# Patient Record
Sex: Female | Born: 1960 | Race: White | Hispanic: No | Smoking: Former smoker
Health system: Southern US, Community
[De-identification: ages and names within clinical notes are randomized; demographics above are authoritative.]

## PROBLEM LIST (undated history)

## (undated) ENCOUNTER — Ambulatory Visit: Discharge status: Absent without leave | Payer: Medicare Other

## (undated) DIAGNOSIS — I7300 Raynaud's syndrome without gangrene: Secondary | ICD-10-CM

## (undated) DIAGNOSIS — F32A Depression, unspecified: Secondary | ICD-10-CM

## (undated) DIAGNOSIS — F419 Anxiety disorder, unspecified: Secondary | ICD-10-CM

## (undated) DIAGNOSIS — J189 Pneumonia, unspecified organism: Secondary | ICD-10-CM

## (undated) DIAGNOSIS — M797 Fibromyalgia: Secondary | ICD-10-CM

## (undated) DIAGNOSIS — E785 Hyperlipidemia, unspecified: Secondary | ICD-10-CM

## (undated) DIAGNOSIS — I00 Rheumatic fever without heart involvement: Secondary | ICD-10-CM

## (undated) DIAGNOSIS — F329 Major depressive disorder, single episode, unspecified: Secondary | ICD-10-CM

## (undated) DIAGNOSIS — M549 Dorsalgia, unspecified: Secondary | ICD-10-CM

## (undated) DIAGNOSIS — R32 Unspecified urinary incontinence: Secondary | ICD-10-CM

## (undated) DIAGNOSIS — R51 Headache: Secondary | ICD-10-CM

## (undated) DIAGNOSIS — Z8679 Personal history of other diseases of the circulatory system: Secondary | ICD-10-CM

## (undated) DIAGNOSIS — M2550 Pain in unspecified joint: Secondary | ICD-10-CM

## (undated) DIAGNOSIS — F319 Bipolar disorder, unspecified: Secondary | ICD-10-CM

## (undated) DIAGNOSIS — M1990 Unspecified osteoarthritis, unspecified site: Secondary | ICD-10-CM

## (undated) DIAGNOSIS — R351 Nocturia: Secondary | ICD-10-CM

## (undated) DIAGNOSIS — R011 Cardiac murmur, unspecified: Secondary | ICD-10-CM

## (undated) DIAGNOSIS — J302 Other seasonal allergic rhinitis: Secondary | ICD-10-CM

## (undated) DIAGNOSIS — IMO0002 Reserved for concepts with insufficient information to code with codable children: Secondary | ICD-10-CM

## (undated) DIAGNOSIS — A6000 Herpesviral infection of urogenital system, unspecified: Secondary | ICD-10-CM

## (undated) DIAGNOSIS — J45909 Unspecified asthma, uncomplicated: Secondary | ICD-10-CM

## (undated) DIAGNOSIS — K5900 Constipation, unspecified: Secondary | ICD-10-CM

## (undated) DIAGNOSIS — E039 Hypothyroidism, unspecified: Secondary | ICD-10-CM

## (undated) DIAGNOSIS — G8929 Other chronic pain: Secondary | ICD-10-CM

## (undated) DIAGNOSIS — K219 Gastro-esophageal reflux disease without esophagitis: Secondary | ICD-10-CM

## (undated) DIAGNOSIS — I10 Essential (primary) hypertension: Secondary | ICD-10-CM

## (undated) DIAGNOSIS — D649 Anemia, unspecified: Secondary | ICD-10-CM

## (undated) DIAGNOSIS — Z8709 Personal history of other diseases of the respiratory system: Secondary | ICD-10-CM

## (undated) DIAGNOSIS — R531 Weakness: Secondary | ICD-10-CM

## (undated) DIAGNOSIS — R609 Edema, unspecified: Secondary | ICD-10-CM

## (undated) DIAGNOSIS — M62838 Other muscle spasm: Secondary | ICD-10-CM

## (undated) DIAGNOSIS — I639 Cerebral infarction, unspecified: Secondary | ICD-10-CM

## (undated) DIAGNOSIS — R600 Localized edema: Secondary | ICD-10-CM

## (undated) HISTORY — PX: ESOPHAGOGASTRODUODENOSCOPY: SHX1529

## (undated) HISTORY — PX: CHOLECYSTECTOMY: SHX55

## (undated) HISTORY — PX: COLONOSCOPY: SHX174

## (undated) HISTORY — PX: TONSILLECTOMY: SUR1361

## (undated) HISTORY — PX: ABDOMINAL HYSTERECTOMY: SHX81

## (undated) HISTORY — PX: BACK SURGERY: SHX140

## (undated) HISTORY — PX: TUBAL LIGATION: SHX77

## (undated) HISTORY — PX: OTHER SURGICAL HISTORY: SHX169

## (undated) HISTORY — PX: SEPTOPLASTY: SUR1290

---

## 2004-06-19 ENCOUNTER — Emergency Department: Payer: Self-pay | Admitting: Emergency Medicine

## 2006-01-06 ENCOUNTER — Ambulatory Visit: Payer: Self-pay

## 2007-05-10 ENCOUNTER — Emergency Department: Payer: Self-pay | Admitting: Emergency Medicine

## 2007-05-20 ENCOUNTER — Ambulatory Visit: Payer: Self-pay | Admitting: Unknown Physician Specialty

## 2007-07-01 ENCOUNTER — Ambulatory Visit: Payer: Self-pay | Admitting: Anesthesiology

## 2007-07-21 ENCOUNTER — Inpatient Hospital Stay: Payer: Self-pay | Admitting: Vascular Surgery

## 2007-08-04 ENCOUNTER — Ambulatory Visit: Payer: Self-pay | Admitting: Anesthesiology

## 2007-08-16 ENCOUNTER — Encounter: Payer: Self-pay | Admitting: Anesthesiology

## 2007-09-02 ENCOUNTER — Ambulatory Visit: Payer: Self-pay | Admitting: Anesthesiology

## 2007-09-07 ENCOUNTER — Ambulatory Visit: Payer: Self-pay

## 2007-11-14 ENCOUNTER — Emergency Department: Payer: Self-pay | Admitting: Internal Medicine

## 2009-04-10 ENCOUNTER — Emergency Department: Payer: Self-pay | Admitting: Emergency Medicine

## 2009-05-23 ENCOUNTER — Ambulatory Visit: Payer: Self-pay | Admitting: Family Medicine

## 2009-05-28 ENCOUNTER — Ambulatory Visit: Payer: Self-pay | Admitting: Family Medicine

## 2009-11-11 ENCOUNTER — Emergency Department: Payer: Self-pay | Admitting: Emergency Medicine

## 2009-12-02 ENCOUNTER — Emergency Department (HOSPITAL_COMMUNITY): Admission: EM | Admit: 2009-12-02 | Discharge: 2009-12-02 | Payer: Self-pay | Admitting: Emergency Medicine

## 2010-01-23 ENCOUNTER — Encounter: Payer: Self-pay | Admitting: Rheumatology

## 2010-02-12 ENCOUNTER — Encounter: Payer: Self-pay | Admitting: Rheumatology

## 2010-08-15 ENCOUNTER — Emergency Department: Payer: Self-pay | Admitting: Emergency Medicine

## 2010-09-05 ENCOUNTER — Encounter: Payer: Self-pay | Admitting: Rheumatology

## 2010-09-13 ENCOUNTER — Encounter: Payer: Self-pay | Admitting: Rheumatology

## 2010-11-06 ENCOUNTER — Encounter: Payer: Self-pay | Admitting: Rheumatology

## 2011-02-01 ENCOUNTER — Emergency Department: Payer: Self-pay | Admitting: *Deleted

## 2011-12-08 ENCOUNTER — Ambulatory Visit: Payer: Self-pay | Admitting: Pain Medicine

## 2011-12-10 ENCOUNTER — Other Ambulatory Visit: Payer: Self-pay | Admitting: Pain Medicine

## 2011-12-10 LAB — SEDIMENTATION RATE: Erythrocyte Sed Rate: 1 mm/hr (ref 0–30)

## 2011-12-26 ENCOUNTER — Ambulatory Visit: Payer: Self-pay | Admitting: Pain Medicine

## 2012-01-05 ENCOUNTER — Ambulatory Visit: Payer: Self-pay | Admitting: Pain Medicine

## 2012-08-01 ENCOUNTER — Emergency Department: Payer: Self-pay | Admitting: Emergency Medicine

## 2012-08-01 LAB — CBC
HCT: 46.3 % (ref 35.0–47.0)
HGB: 16.1 g/dL — ABNORMAL HIGH (ref 12.0–16.0)
MCH: 32 pg (ref 26.0–34.0)
MCHC: 34.7 g/dL (ref 32.0–36.0)
MCV: 92 fL (ref 80–100)
Platelet: 203 10*3/uL (ref 150–440)
RBC: 5.01 10*6/uL (ref 3.80–5.20)
RDW: 13.8 % (ref 11.5–14.5)

## 2012-08-01 LAB — COMPREHENSIVE METABOLIC PANEL
Albumin: 3.7 g/dL (ref 3.4–5.0)
Alkaline Phosphatase: 68 U/L (ref 50–136)
BUN: 9 mg/dL (ref 7–18)
Bilirubin,Total: 0.5 mg/dL (ref 0.2–1.0)
Calcium, Total: 9.1 mg/dL (ref 8.5–10.1)
Creatinine: 0.75 mg/dL (ref 0.60–1.30)
Glucose: 147 mg/dL — ABNORMAL HIGH (ref 65–99)
Potassium: 3.1 mmol/L — ABNORMAL LOW (ref 3.5–5.1)
Sodium: 138 mmol/L (ref 136–145)

## 2012-08-17 ENCOUNTER — Emergency Department: Payer: Self-pay | Admitting: Emergency Medicine

## 2012-08-17 LAB — URINALYSIS, COMPLETE
Bacteria: NONE SEEN
Bilirubin,UR: NEGATIVE
Glucose,UR: NEGATIVE mg/dL (ref 0–75)
Ketone: NEGATIVE
Nitrite: NEGATIVE
Ph: 9 (ref 4.5–8.0)
Specific Gravity: 1.003 (ref 1.003–1.030)

## 2012-08-17 LAB — WET PREP, GENITAL

## 2012-08-17 LAB — GC/CHLAMYDIA PROBE AMP

## 2012-08-19 DIAGNOSIS — M545 Low back pain, unspecified: Secondary | ICD-10-CM | POA: Insufficient documentation

## 2012-08-31 ENCOUNTER — Ambulatory Visit: Payer: Self-pay | Admitting: Internal Medicine

## 2012-12-14 ENCOUNTER — Other Ambulatory Visit: Payer: Self-pay | Admitting: Neurosurgery

## 2012-12-14 DIAGNOSIS — M549 Dorsalgia, unspecified: Secondary | ICD-10-CM

## 2012-12-15 ENCOUNTER — Ambulatory Visit: Payer: Self-pay | Admitting: Gastroenterology

## 2012-12-21 ENCOUNTER — Ambulatory Visit
Admission: RE | Admit: 2012-12-21 | Discharge: 2012-12-21 | Disposition: A | Discharge status: Home / Self Care | Payer: PRIVATE HEALTH INSURANCE | Source: Ambulatory Visit | Attending: Neurosurgery | Admitting: Neurosurgery

## 2012-12-21 VITALS — BP 136/87 | HR 69

## 2012-12-21 DIAGNOSIS — M549 Dorsalgia, unspecified: Secondary | ICD-10-CM

## 2012-12-21 MED ORDER — MEPERIDINE HCL 100 MG/ML IJ SOLN
100.0000 mg | Freq: Once | INTRAMUSCULAR | Status: AC
  Administered 2012-12-21: 100 mg via INTRAMUSCULAR

## 2012-12-21 MED ORDER — IOHEXOL 180 MG/ML  SOLN
15.0000 mL | Freq: Once | INTRAMUSCULAR | Status: AC | PRN
  Administered 2012-12-21: 15 mL via INTRATHECAL

## 2012-12-21 MED ORDER — DIAZEPAM 5 MG PO TABS
10.0000 mg | ORAL_TABLET | Freq: Once | ORAL | Status: AC
  Administered 2012-12-21: 10 mg via ORAL

## 2012-12-21 MED ORDER — ONDANSETRON HCL 4 MG/2ML IJ SOLN
4.0000 mg | Freq: Once | INTRAMUSCULAR | Status: AC
  Administered 2012-12-21: 4 mg via INTRAMUSCULAR

## 2012-12-21 NOTE — Progress Notes (Signed)
Pt states she has been off fluoxetine for the past 2 days. Discharge instructions explained to pt.

## 2012-12-22 ENCOUNTER — Other Ambulatory Visit: Payer: Self-pay

## 2012-12-29 ENCOUNTER — Other Ambulatory Visit: Payer: Self-pay | Admitting: Neurosurgery

## 2012-12-31 ENCOUNTER — Encounter (HOSPITAL_COMMUNITY): Payer: Self-pay | Admitting: Pharmacy Technician

## 2013-01-10 ENCOUNTER — Encounter (HOSPITAL_COMMUNITY)
Admission: RE | Admit: 2013-01-10 | Discharge: 2013-01-10 | Disposition: A | Discharge status: Home / Self Care | Payer: PRIVATE HEALTH INSURANCE | Source: Ambulatory Visit | Attending: Neurosurgery | Admitting: Neurosurgery

## 2013-01-10 ENCOUNTER — Encounter (HOSPITAL_COMMUNITY): Payer: Self-pay

## 2013-01-10 ENCOUNTER — Ambulatory Visit (HOSPITAL_COMMUNITY)
Admission: RE | Admit: 2013-01-10 | Discharge: 2013-01-10 | Disposition: A | Discharge status: Home / Self Care | Payer: PRIVATE HEALTH INSURANCE | Source: Ambulatory Visit | Attending: Anesthesiology | Admitting: Anesthesiology

## 2013-01-10 DIAGNOSIS — Z01812 Encounter for preprocedural laboratory examination: Secondary | ICD-10-CM | POA: Insufficient documentation

## 2013-01-10 DIAGNOSIS — Z01818 Encounter for other preprocedural examination: Secondary | ICD-10-CM | POA: Insufficient documentation

## 2013-01-10 HISTORY — DX: Cerebral infarction, unspecified: I63.9

## 2013-01-10 HISTORY — DX: Reserved for concepts with insufficient information to code with codable children: IMO0002

## 2013-01-10 HISTORY — DX: Gastro-esophageal reflux disease without esophagitis: K21.9

## 2013-01-10 HISTORY — DX: Hypothyroidism, unspecified: E03.9

## 2013-01-10 HISTORY — DX: Unspecified osteoarthritis, unspecified site: M19.90

## 2013-01-10 HISTORY — DX: Anxiety disorder, unspecified: F41.9

## 2013-01-10 HISTORY — DX: Hyperlipidemia, unspecified: E78.5

## 2013-01-10 HISTORY — DX: Essential (primary) hypertension: I10

## 2013-01-10 HISTORY — DX: Personal history of other diseases of the circulatory system: Z86.79

## 2013-01-10 HISTORY — DX: Depression, unspecified: F32.A

## 2013-01-10 HISTORY — DX: Fibromyalgia: M79.7

## 2013-01-10 HISTORY — DX: Bipolar disorder, unspecified: F31.9

## 2013-01-10 HISTORY — DX: Major depressive disorder, single episode, unspecified: F32.9

## 2013-01-10 LAB — CBC
HCT: 45.4 % (ref 36.0–46.0)
Hemoglobin: 15.6 g/dL — ABNORMAL HIGH (ref 12.0–15.0)
MCH: 31.5 pg (ref 26.0–34.0)
MCHC: 34.4 g/dL (ref 30.0–36.0)
Platelets: 206 10*3/uL (ref 150–400)

## 2013-01-10 LAB — BASIC METABOLIC PANEL
BUN: 11 mg/dL (ref 6–23)
Chloride: 95 mEq/L — ABNORMAL LOW (ref 96–112)
Glucose, Bld: 102 mg/dL — ABNORMAL HIGH (ref 70–99)
Potassium: 3.3 mEq/L — ABNORMAL LOW (ref 3.5–5.1)

## 2013-01-10 NOTE — Pre-Procedure Instructions (Signed)
ANGLE DIRUSSO  01/10/2013   Your procedure is scheduled on:  October 3  Report to [facility] Entrance "A" [address] at 1:00 PM.  Call this number if you have problems the morning of surgery: [phone]   Remember:   Do not eat food or drink liquids after midnight.   Take these medicines the morning of surgery with A SIP OF WATER: Dymista, Carvedilol, Zyrtec, Nexium, Prozac, Levothyroxine, Oxycodone, Lyrica   STOP Voltaren today  Do not take Aspirin, Aleve, Naproxen, Advil, Ibuprofen, Vitamin, Herbs, or Supplements starting today   Do not wear jewelry, make-up or nail polish.  Do not wear lotions, powders, or perfumes. You may wear deodorant.  Do not shave 48 hours prior to surgery. Men may shave face and neck.  Do not bring valuables to the hospital.  [facility] is not responsible                  for any belongings or valuables.               Contacts, dentures or bridgework may not be worn into surgery.  Leave suitcase in the car. After surgery it may be brought to your room.  For patients admitted to the hospital, discharge time is determined by your                treatment team.               Patients discharged the day of surgery will not be allowed to drive  home.  Name and phone number of your driver: Family/ Friend  Special Instructions: Shower using CHG 2 nights before surgery and the night before surgery.  If you shower the day of surgery use CHG.  Use special wash - you have one bottle of CHG for all showers.  You should use approximately 1/3 of the bottle for each shower.   Please read over the following fact sheets that you were given: Pain Booklet, Coughing and Deep Breathing and Surgical Site Infection Prevention

## 2013-01-13 MED ORDER — VANCOMYCIN HCL 10 G IV SOLR
1500.0000 mg | INTRAVENOUS | Status: AC
  Administered 2013-01-14: 1500 mg via INTRAVENOUS
  Filled 2013-01-13: qty 1500

## 2013-01-14 ENCOUNTER — Ambulatory Visit (HOSPITAL_COMMUNITY): Payer: PRIVATE HEALTH INSURANCE | Admitting: Certified Registered"

## 2013-01-14 ENCOUNTER — Encounter (HOSPITAL_COMMUNITY): Payer: Self-pay | Admitting: Certified Registered"

## 2013-01-14 ENCOUNTER — Encounter (HOSPITAL_COMMUNITY): Payer: Self-pay | Admitting: *Deleted

## 2013-01-14 ENCOUNTER — Ambulatory Visit (HOSPITAL_COMMUNITY): Payer: PRIVATE HEALTH INSURANCE

## 2013-01-14 ENCOUNTER — Ambulatory Visit (HOSPITAL_COMMUNITY)
Admission: RE | Admit: 2013-01-14 | Discharge: 2013-01-15 | Disposition: A | Discharge status: Home / Self Care | Payer: PRIVATE HEALTH INSURANCE | Source: Ambulatory Visit | Attending: Neurosurgery | Admitting: Neurosurgery

## 2013-01-14 ENCOUNTER — Encounter (HOSPITAL_COMMUNITY)
Admission: RE | Disposition: A | Discharge status: Home / Self Care | Payer: Self-pay | Source: Ambulatory Visit | Attending: Neurosurgery

## 2013-01-14 DIAGNOSIS — M5126 Other intervertebral disc displacement, lumbar region: Secondary | ICD-10-CM | POA: Insufficient documentation

## 2013-01-14 DIAGNOSIS — I10 Essential (primary) hypertension: Secondary | ICD-10-CM | POA: Insufficient documentation

## 2013-01-14 DIAGNOSIS — IMO0002 Reserved for concepts with insufficient information to code with codable children: Secondary | ICD-10-CM | POA: Insufficient documentation

## 2013-01-14 DIAGNOSIS — Z79899 Other long term (current) drug therapy: Secondary | ICD-10-CM | POA: Insufficient documentation

## 2013-01-14 HISTORY — PX: LUMBAR LAMINECTOMY/DECOMPRESSION MICRODISCECTOMY: SHX5026

## 2013-01-14 SURGERY — LUMBAR LAMINECTOMY/DECOMPRESSION MICRODISCECTOMY 1 LEVEL
Anesthesia: General | Site: Back | Laterality: Right | Wound class: Clean

## 2013-01-14 MED ORDER — SODIUM CHLORIDE 0.9 % IJ SOLN: 3.0000 mL | INTRAMUSCULAR | Status: DC | PRN

## 2013-01-14 MED ORDER — FENTANYL CITRATE 0.05 MG/ML IJ SOLN
INTRAMUSCULAR | Status: DC | PRN
  Administered 2013-01-14 (×4): 50 ug via INTRAVENOUS
  Administered 2013-01-14: 200 ug via INTRAVENOUS
  Administered 2013-01-14: 50 ug via INTRAVENOUS

## 2013-01-14 MED ORDER — DEXAMETHASONE 4 MG PO TABS
4.0000 mg | ORAL_TABLET | Freq: Four times a day (QID) | ORAL | Status: AC
  Administered 2013-01-14: 4 mg via ORAL
  Filled 2013-01-14 (×2): qty 1

## 2013-01-14 MED ORDER — ZOLPIDEM TARTRATE 5 MG PO TABS: 5.0000 mg | ORAL_TABLET | Freq: Every evening | ORAL | Status: DC | PRN

## 2013-01-14 MED ORDER — OXYCODONE HCL 5 MG/5ML PO SOLN: 5.0000 mg | Freq: Once | ORAL | Status: AC | PRN

## 2013-01-14 MED ORDER — SIMVASTATIN 40 MG PO TABS
40.0000 mg | ORAL_TABLET | Freq: Every evening | ORAL | Status: DC
  Administered 2013-01-14: 40 mg via ORAL
  Filled 2013-01-14 (×3): qty 1

## 2013-01-14 MED ORDER — PREGABALIN 75 MG PO CAPS
150.0000 mg | ORAL_CAPSULE | Freq: Three times a day (TID) | ORAL | Status: DC
  Administered 2013-01-14: 150 mg via ORAL
  Filled 2013-01-14: qty 2

## 2013-01-14 MED ORDER — ONDANSETRON HCL 4 MG/2ML IJ SOLN: 4.0000 mg | INTRAMUSCULAR | Status: DC | PRN

## 2013-01-14 MED ORDER — CYCLOBENZAPRINE HCL 10 MG PO TABS
ORAL_TABLET | ORAL | Status: AC
  Filled 2013-01-14: qty 1

## 2013-01-14 MED ORDER — 0.9 % SODIUM CHLORIDE (POUR BTL) OPTIME
TOPICAL | Status: DC | PRN
  Administered 2013-01-14: 1000 mL

## 2013-01-14 MED ORDER — PANTOPRAZOLE SODIUM 40 MG IV SOLR
40.0000 mg | Freq: Every day | INTRAVENOUS | Status: DC
  Administered 2013-01-14: 40 mg via INTRAVENOUS
  Filled 2013-01-14 (×3): qty 40

## 2013-01-14 MED ORDER — ACYCLOVIR 400 MG PO TABS
400.0000 mg | ORAL_TABLET | Freq: Two times a day (BID) | ORAL | Status: DC
  Administered 2013-01-14: 400 mg via ORAL
  Filled 2013-01-14 (×3): qty 1

## 2013-01-14 MED ORDER — LIDOCAINE HCL (CARDIAC) 20 MG/ML IV SOLN
INTRAVENOUS | Status: DC | PRN
  Administered 2013-01-14: 80 mg via INTRAVENOUS

## 2013-01-14 MED ORDER — HEMOSTATIC AGENTS (NO CHARGE) OPTIME
TOPICAL | Status: DC | PRN
  Administered 2013-01-14: 1 via TOPICAL

## 2013-01-14 MED ORDER — HYDROMORPHONE HCL PF 1 MG/ML IJ SOLN
0.2500 mg | INTRAMUSCULAR | Status: DC | PRN
  Administered 2013-01-14 (×4): 0.5 mg via INTRAVENOUS

## 2013-01-14 MED ORDER — ACETAMINOPHEN 650 MG RE SUPP: 650.0000 mg | RECTAL | Status: DC | PRN

## 2013-01-14 MED ORDER — CARVEDILOL 12.5 MG PO TABS
12.5000 mg | ORAL_TABLET | Freq: Two times a day (BID) | ORAL | Status: DC
  Administered 2013-01-15: 12.5 mg via ORAL
  Filled 2013-01-14 (×3): qty 1

## 2013-01-14 MED ORDER — HYDROMORPHONE HCL PF 1 MG/ML IJ SOLN
1.0000 mg | INTRAMUSCULAR | Status: DC | PRN
  Administered 2013-01-14: 1 mg via INTRAMUSCULAR
  Filled 2013-01-14: qty 1

## 2013-01-14 MED ORDER — SODIUM CHLORIDE 0.9 % IR SOLN
Status: DC | PRN
  Administered 2013-01-14: 15:00:00

## 2013-01-14 MED ORDER — OXYCODONE-ACETAMINOPHEN 5-325 MG PO TABS: 1.0000 | ORAL_TABLET | Freq: Once | ORAL | Status: DC

## 2013-01-14 MED ORDER — LORAZEPAM 1 MG PO TABS
1.0000 mg | ORAL_TABLET | Freq: Three times a day (TID) | ORAL | Status: DC
  Administered 2013-01-14: 1 mg via ORAL
  Filled 2013-01-14: qty 1

## 2013-01-14 MED ORDER — PREGABALIN 75 MG PO CAPS: 150.0000 mg | ORAL_CAPSULE | Freq: Three times a day (TID) | ORAL | Status: DC

## 2013-01-14 MED ORDER — OXYCODONE-ACETAMINOPHEN 5-325 MG PO TABS
ORAL_TABLET | ORAL | Status: AC
  Filled 2013-01-14: qty 1

## 2013-01-14 MED ORDER — ONDANSETRON HCL 4 MG/2ML IJ SOLN
INTRAMUSCULAR | Status: DC | PRN
  Administered 2013-01-14: 4 mg via INTRAVENOUS

## 2013-01-14 MED ORDER — CARVEDILOL 12.5 MG PO TABS
ORAL_TABLET | ORAL | Status: AC
  Filled 2013-01-14: qty 1

## 2013-01-14 MED ORDER — VANCOMYCIN HCL 10 G IV SOLR
1500.0000 mg | Freq: Once | INTRAVENOUS | Status: AC
  Administered 2013-01-15: 1500 mg via INTRAVENOUS
  Filled 2013-01-14: qty 1500

## 2013-01-14 MED ORDER — MIDAZOLAM HCL 2 MG/2ML IJ SOLN: 0.5000 mg | Freq: Once | INTRAMUSCULAR | Status: DC | PRN

## 2013-01-14 MED ORDER — SODIUM CHLORIDE 0.9 % IV SOLN
250.0000 mL | INTRAVENOUS | Status: DC
  Administered 2013-01-14: 250 mL via INTRAVENOUS

## 2013-01-14 MED ORDER — DOCUSATE SODIUM 100 MG PO CAPS
200.0000 mg | ORAL_CAPSULE | Freq: Every day | ORAL | Status: DC
  Administered 2013-01-14: 200 mg via ORAL
  Filled 2013-01-14: qty 2

## 2013-01-14 MED ORDER — GLYCOPYRROLATE 0.2 MG/ML IJ SOLN
INTRAMUSCULAR | Status: DC | PRN
  Administered 2013-01-14: 0.6 mg via INTRAVENOUS

## 2013-01-14 MED ORDER — MENTHOL 3 MG MT LOZG: 1.0000 | LOZENGE | OROMUCOSAL | Status: DC | PRN

## 2013-01-14 MED ORDER — HYDROMORPHONE HCL PF 1 MG/ML IJ SOLN
INTRAMUSCULAR | Status: AC
  Filled 2013-01-14: qty 1

## 2013-01-14 MED ORDER — PROMETHAZINE HCL 25 MG/ML IJ SOLN: 6.2500 mg | INTRAMUSCULAR | Status: DC | PRN

## 2013-01-14 MED ORDER — DEXAMETHASONE SODIUM PHOSPHATE 4 MG/ML IJ SOLN
4.0000 mg | Freq: Four times a day (QID) | INTRAMUSCULAR | Status: AC
  Administered 2013-01-14: 4 mg via INTRAVENOUS
  Filled 2013-01-14: qty 1

## 2013-01-14 MED ORDER — LACTATED RINGERS IV SOLN
INTRAVENOUS | Status: DC
  Administered 2013-01-14: 11:00:00 via INTRAVENOUS

## 2013-01-14 MED ORDER — MEPERIDINE HCL 25 MG/ML IJ SOLN: 6.2500 mg | INTRAMUSCULAR | Status: DC | PRN

## 2013-01-14 MED ORDER — SODIUM CHLORIDE 0.9 % IV SOLN
INTRAVENOUS | Status: DC | PRN
  Administered 2013-01-14 (×2): via INTRAVENOUS

## 2013-01-14 MED ORDER — PHENOL 1.4 % MT LIQD: 1.0000 | OROMUCOSAL | Status: DC | PRN

## 2013-01-14 MED ORDER — OXYCODONE HCL 5 MG PO TABS
ORAL_TABLET | ORAL | Status: AC
  Filled 2013-01-14: qty 1

## 2013-01-14 MED ORDER — BUPIVACAINE HCL (PF) 0.5 % IJ SOLN
INTRAMUSCULAR | Status: DC | PRN
  Administered 2013-01-14: 20 mL

## 2013-01-14 MED ORDER — KCL IN DEXTROSE-NACL 20-5-0.45 MEQ/L-%-% IV SOLN
80.0000 mL/h | INTRAVENOUS | Status: DC
  Administered 2013-01-14: 80 mL/h via INTRAVENOUS
  Filled 2013-01-14 (×3): qty 1000

## 2013-01-14 MED ORDER — NEOSTIGMINE METHYLSULFATE 1 MG/ML IJ SOLN
INTRAMUSCULAR | Status: DC | PRN
  Administered 2013-01-14: 4 mg via INTRAVENOUS

## 2013-01-14 MED ORDER — KETOROLAC TROMETHAMINE 30 MG/ML IJ SOLN
30.0000 mg | Freq: Four times a day (QID) | INTRAMUSCULAR | Status: DC
  Administered 2013-01-14 – 2013-01-15 (×3): 30 mg via INTRAVENOUS
  Filled 2013-01-14 (×5): qty 1

## 2013-01-14 MED ORDER — MIDAZOLAM HCL 5 MG/5ML IJ SOLN
INTRAMUSCULAR | Status: DC | PRN
  Administered 2013-01-14 (×2): 1 mg via INTRAVENOUS

## 2013-01-14 MED ORDER — LACTATED RINGERS IV SOLN
INTRAVENOUS | Status: DC | PRN
  Administered 2013-01-14: 14:00:00 via INTRAVENOUS

## 2013-01-14 MED ORDER — CARVEDILOL 12.5 MG PO TABS
12.5000 mg | ORAL_TABLET | Freq: Once | ORAL | Status: DC
  Administered 2013-01-14: 12.5 mg via ORAL

## 2013-01-14 MED ORDER — FLUOXETINE HCL 20 MG PO TABS
40.0000 mg | ORAL_TABLET | Freq: Every day | ORAL | Status: DC
  Administered 2013-01-14: 40 mg via ORAL
  Filled 2013-01-14 (×3): qty 2

## 2013-01-14 MED ORDER — CYCLOBENZAPRINE HCL 10 MG PO TABS
10.0000 mg | ORAL_TABLET | Freq: Three times a day (TID) | ORAL | Status: DC | PRN
  Administered 2013-01-14 – 2013-01-15 (×2): 10 mg via ORAL
  Filled 2013-01-14 (×2): qty 1

## 2013-01-14 MED ORDER — ACETAMINOPHEN 325 MG PO TABS: 650.0000 mg | ORAL_TABLET | ORAL | Status: DC | PRN

## 2013-01-14 MED ORDER — SODIUM CHLORIDE 0.9 % IJ SOLN: 3.0000 mL | Freq: Two times a day (BID) | INTRAMUSCULAR | Status: DC

## 2013-01-14 MED ORDER — LORATADINE 10 MG PO TABS
10.0000 mg | ORAL_TABLET | Freq: Every day | ORAL | Status: DC
  Administered 2013-01-14: 10 mg via ORAL
  Filled 2013-01-14 (×3): qty 1

## 2013-01-14 MED ORDER — LEVOTHYROXINE SODIUM 50 MCG PO TABS
50.0000 ug | ORAL_TABLET | Freq: Every day | ORAL | Status: DC
  Administered 2013-01-15: 50 ug via ORAL
  Filled 2013-01-14 (×2): qty 1

## 2013-01-14 MED ORDER — ROCURONIUM BROMIDE 100 MG/10ML IV SOLN
INTRAVENOUS | Status: DC | PRN
  Administered 2013-01-14: 50 mg via INTRAVENOUS

## 2013-01-14 MED ORDER — OXYCODONE HCL 5 MG PO TABS
5.0000 mg | ORAL_TABLET | Freq: Once | ORAL | Status: AC | PRN
Start: 2013-01-14 — End: 2013-01-14
  Administered 2013-01-14: 5 mg via ORAL

## 2013-01-14 MED ORDER — HYDROCHLOROTHIAZIDE 25 MG PO TABS
25.0000 mg | ORAL_TABLET | Freq: Every day | ORAL | Status: DC
  Filled 2013-01-14: qty 1

## 2013-01-14 MED ORDER — PROPOFOL 10 MG/ML IV BOLUS
INTRAVENOUS | Status: DC | PRN
  Administered 2013-01-14: 120 mg via INTRAVENOUS

## 2013-01-14 MED ORDER — THROMBIN 5000 UNITS EX SOLR
CUTANEOUS | Status: DC | PRN
  Administered 2013-01-14: 5000 [IU] via TOPICAL

## 2013-01-14 MED ORDER — HYDROCODONE-ACETAMINOPHEN 5-325 MG PO TABS
1.0000 | ORAL_TABLET | ORAL | Status: DC | PRN
  Administered 2013-01-15 (×3): 2 via ORAL
  Filled 2013-01-14 (×4): qty 2

## 2013-01-14 SURGICAL SUPPLY — 51 items
BAG DECANTER FOR FLEXI CONT (MISCELLANEOUS) ×2 IMPLANT
BENZOIN TINCTURE PRP APPL 2/3 (GAUZE/BANDAGES/DRESSINGS) ×2 IMPLANT
BLADE SURG ROTATE 9660 (MISCELLANEOUS) ×2 IMPLANT
BRUSH SCRUB EZ PLAIN DRY (MISCELLANEOUS) ×2 IMPLANT
BUR CUTTER 7.0 ROUND (BURR) ×2 IMPLANT
BUR MATCHSTICK NEURO 3.0 LAGG (BURR) ×2 IMPLANT
CANISTER SUCTION 2500CC (MISCELLANEOUS) ×2 IMPLANT
CLOTH BEACON ORANGE TIMEOUT ST (SAFETY) ×2 IMPLANT
CONT SPEC 4OZ CLIKSEAL STRL BL (MISCELLANEOUS) ×2 IMPLANT
DERMABOND ADHESIVE PROPEN (GAUZE/BANDAGES/DRESSINGS) ×1
DERMABOND ADVANCED (GAUZE/BANDAGES/DRESSINGS) ×1
DERMABOND ADVANCED .7 DNX12 (GAUZE/BANDAGES/DRESSINGS) ×1 IMPLANT
DERMABOND ADVANCED .7 DNX6 (GAUZE/BANDAGES/DRESSINGS) ×1 IMPLANT
DRAPE LAPAROTOMY 100X72X124 (DRAPES) ×2 IMPLANT
DRAPE MICROSCOPE LEICA (MISCELLANEOUS) ×2 IMPLANT
DRAPE SURG 17X23 STRL (DRAPES) ×4 IMPLANT
DRESSING TELFA 8X3 (GAUZE/BANDAGES/DRESSINGS) ×2 IMPLANT
DRSG OPSITE POSTOP 4X6 (GAUZE/BANDAGES/DRESSINGS) ×2 IMPLANT
DURAPREP 26ML APPLICATOR (WOUND CARE) ×2 IMPLANT
ELECT BLADE 4.0 EZ CLEAN MEGAD (MISCELLANEOUS) ×2
ELECT REM PT RETURN 9FT ADLT (ELECTROSURGICAL) ×2
ELECTRODE BLDE 4.0 EZ CLN MEGD (MISCELLANEOUS) ×1 IMPLANT
ELECTRODE REM PT RTRN 9FT ADLT (ELECTROSURGICAL) ×1 IMPLANT
GAUZE SPONGE 4X4 16PLY XRAY LF (GAUZE/BANDAGES/DRESSINGS) IMPLANT
GLOVE BIOGEL PI IND STRL 8 (GLOVE) ×1 IMPLANT
GLOVE BIOGEL PI INDICATOR 8 (GLOVE) ×1
GLOVE ECLIPSE 8.0 STRL XLNG CF (GLOVE) ×2 IMPLANT
GLOVE ECLIPSE 8.5 STRL (GLOVE) ×4 IMPLANT
GLOVE INDICATOR 7.5 STRL GRN (GLOVE) ×2 IMPLANT
GLOVE SURG SS PI 7.0 STRL IVOR (GLOVE) ×4 IMPLANT
GOWN BRE IMP SLV AUR LG STRL (GOWN DISPOSABLE) IMPLANT
GOWN BRE IMP SLV AUR XL STRL (GOWN DISPOSABLE) ×6 IMPLANT
GOWN STRL REIN 2XL LVL4 (GOWN DISPOSABLE) ×2 IMPLANT
KIT BASIN OR (CUSTOM PROCEDURE TRAY) ×2 IMPLANT
KIT ROOM TURNOVER OR (KITS) ×2 IMPLANT
NEEDLE HYPO 22GX1.5 SAFETY (NEEDLE) ×2 IMPLANT
NEEDLE SPNL 22GX3.5 QUINCKE BK (NEEDLE) ×4 IMPLANT
NS IRRIG 1000ML POUR BTL (IV SOLUTION) ×2 IMPLANT
PACK LAMINECTOMY NEURO (CUSTOM PROCEDURE TRAY) ×2 IMPLANT
PAD ARMBOARD 7.5X6 YLW CONV (MISCELLANEOUS) ×6 IMPLANT
PATTIES SURGICAL .75X.75 (GAUZE/BANDAGES/DRESSINGS) ×2 IMPLANT
RUBBERBAND STERILE (MISCELLANEOUS) ×4 IMPLANT
SPONGE GAUZE 4X4 12PLY (GAUZE/BANDAGES/DRESSINGS) ×2 IMPLANT
SPONGE SURGIFOAM ABS GEL SZ50 (HEMOSTASIS) ×2 IMPLANT
STRIP CLOSURE SKIN 1/2X4 (GAUZE/BANDAGES/DRESSINGS) ×2 IMPLANT
SUT VIC AB 2-0 OS6 18 (SUTURE) ×6 IMPLANT
SUT VIC AB 3-0 CP2 18 (SUTURE) ×2 IMPLANT
SYR 20ML ECCENTRIC (SYRINGE) ×2 IMPLANT
TOWEL OR 17X24 6PK STRL BLUE (TOWEL DISPOSABLE) ×2 IMPLANT
TOWEL OR 17X26 10 PK STRL BLUE (TOWEL DISPOSABLE) ×2 IMPLANT
WATER STERILE IRR 1000ML POUR (IV SOLUTION) ×2 IMPLANT

## 2013-01-14 NOTE — Transfer of Care (Signed)
Immediate Anesthesia Transfer of Care Note  Patient: April Soto  Procedure(s) Performed: Procedure(s) with comments: RIGHT LUMBAR FOUR FIVE  LAMINECTOMY/DECOMPRESSION MICRODISCECTOMY 1 LEVEL (Right) - RIGHT L45 microdiskectomy  Patient Location: PACU  Anesthesia Type:General  Level of Consciousness: awake, alert  and oriented  Airway & Oxygen Therapy: Patient connected to face mask oxygen  Post-op Assessment: Report given to PACU RN  Post vital signs: stable  Complications: No apparent anesthesia complications

## 2013-01-14 NOTE — H&P (Signed)
April Soto is an 52 y.o. female.   Chief Complaint: Back and right leg pain HPI: The patient is a 52 year old female who was evaluated in the office for low back pain with radiation the right thigh and calf. She's had low back problems for many years. She stretching treatment through the years. She's been diagnosed with degenerative disc disease and fibromyalgia and she is on disability from her back issues. She had scans both areas a year prior to being seen in the office. She's been taking oxycodone Lyrica diclofenac which is Dr. much relief. She's had numerous injections at [facility] and [facility] which Dr. much relief. She says the left leg is asymptomatic. After evaluation the office she underwent a myelogram and post myelographic CT. The showed disc abnormality at L4-5 with right L5 nerve root compression. After discussing the options the patient requested surgery now comes for a right L4-5 discectomy. I've had a long discussion with her regarding the risks and benefits of surgical intervention. The risks discussed include but are not limited to bleeding infection weakness numbness paralysis spinal fluid leak coma and death. We have discussed alternative methods of therapy with the risks and benefits of nonintervention. She's had the opportunity to ask numerous questions and appears to understand. With this information in hand she has requested we proceed with surgery.  Past Medical History  Diagnosis Date  . Hyperlipidemia   . Hypothyroidism   . GERD (gastroesophageal reflux disease)   . Degenerative disc disease   . Osteoarthritis   . Fibromyalgia   . Anxiety   . Depression   . Bipolar 1 disorder   . Hypertension     [facility]  . Stroke   . History of rheumatic fever     Past Surgical History  Procedure Laterality Date  . Tonsillectomy    . Septoplasty    . Tubal ligation    . Abdominal hysterectomy    . Cholecystectomy    . Other surgical history     Gastric stapling for weight loss. patient reports done in 1980's    History reviewed. No pertinent family history. Social History:  reports that she has been smoking Cigarettes.  She has a 30 pack-year smoking history. She has never used smokeless tobacco. She reports that she does not drink alcohol or use illicit drugs.  Allergies:  Allergies  Allergen Reactions  . Amoxicillin Itching and Other (See Comments)    depression    Medications Prior to Admission  Medication Sig Dispense Refill  . acyclovir (ZOVIRAX) 400 MG tablet Take 400 mg by mouth daily.      . Azelastine-Fluticasone (DYMISTA) 137-50 MCG/ACT SUSP Place 1 spray into the nose daily.      . carvedilol (COREG) 12.5 MG tablet Take 12.5 mg by mouth 2 (two) times daily with a meal.      . cetirizine (ZYRTEC) 10 MG tablet Take 10 mg by mouth daily.      . cyclobenzaprine (FLEXERIL) 10 MG tablet Take 10 mg by mouth 3 (three) times daily as needed for muscle spasms.      . diclofenac (VOLTAREN) 75 MG EC tablet Take 75 mg by mouth 2 (two) times daily.      Marland Kitchen docusate sodium (RA COL-RITE) 100 MG capsule Take 200 mg by mouth daily.      Marland Kitchen esomeprazole (NEXIUM) 40 MG capsule Take 40 mg by mouth daily before breakfast.      . FLUoxetine (PROZAC) 20 MG tablet Take  40 mg by mouth daily.      . hydrochlorothiazide (HYDRODIURIL) 25 MG tablet Take 25 mg by mouth daily.      Marland Kitchen levothyroxine (SYNTHROID, LEVOTHROID) 50 MCG tablet Take 50 mcg by mouth daily before breakfast.      . LORazepam (ATIVAN) 1 MG tablet Take 1 mg by mouth 3 (three) times daily.      Marland Kitchen oxyCODONE-acetaminophen (PERCOCET/ROXICET) 5-325 MG per tablet Take 1 tablet by mouth 3 (three) times daily.      . pregabalin (LYRICA) 150 MG capsule Take 150 mg by mouth 3 (three) times daily.      . simvastatin (ZOCOR) 40 MG tablet Take 40 mg by mouth every evening.        No results found for this or any previous visit (from the past 48 hour(s)). No results found.  Review of  systems is positive for balance disturbance high blood pressure high cholesterol leg pain when she walks. She also has anxiety depression and thyroid disease.  Blood pressure 156/97, pulse 81, temperature 97.1 F (36.2 C), temperature source Oral, resp. rate 20, SpO2 98.00%.  Nagy exam shows to be awake alert and oriented with no facial asymmetry. She is normal station and gait. Sensation is intact. She is decreased reflexes. Her to assess her motor function to a lot of giveaway. Assessment/Plan Impression is that of a herniated disc L4-5 on the right. The plan is for a right L4-5 microdiscectomy.  Reinaldo Meeker, MD 01/14/2013, 1:56 PM

## 2013-01-14 NOTE — Preoperative (Signed)
Beta Blockers   Reason not to administer Beta Blockers:Not Applicable 

## 2013-01-14 NOTE — Anesthesia Preprocedure Evaluation (Addendum)
Anesthesia Evaluation  Patient identified by MRN, date of birth, ID band Patient awake    Reviewed: Allergy & Precautions, H&P , NPO status , Patient's Chart, lab work & pertinent test results, reviewed documented beta blocker date and time   History of Anesthesia Complications (+) AWARENESS UNDER ANESTHESIA  Airway Mallampati: II TM Distance: >3 FB   Mouth opening: Limited Mouth Opening  Dental  (+) Missing, Dental Advisory Given and Poor Dentition   Pulmonary COPDCurrent Smoker,  breath sounds clear to auscultation  Pulmonary exam normal       Cardiovascular hypertension, Pt. on medications and Pt. on home beta blockers - anginaRhythm:Regular Rate:Normal     Neuro/Psych Anxiety Depression Chronic back pain: narcotics daily TIA Neuromuscular disease CVA, No Residual Symptoms    GI/Hepatic Neg liver ROS, GERD-  Medicated and Controlled,  Endo/Other  Hypothyroidism Morbid obesity  Renal/GU negative Renal ROS     Musculoskeletal  (+) Fibromyalgia -, narcotic dependent  Abdominal (+) + obese,  Abdomen: soft. Bowel sounds: normal.  Peds  Hematology   Anesthesia Other Findings Coreg today at 1030 am.    Reproductive/Obstetrics                       Anesthesia Physical Anesthesia Plan  ASA: III  Anesthesia Plan: General   Post-op Pain Management:    Induction: Intravenous  Airway Management Planned: Oral ETT  Additional Equipment:   Intra-op Plan:   Post-operative Plan: Extubation in OR  Informed Consent: I have reviewed the patients History and Physical, chart, labs and discussed the procedure including the risks, benefits and alternatives for the proposed anesthesia with the patient or authorized representative who has indicated his/her understanding and acceptance.   Dental advisory given  Plan Discussed with: CRNA and Surgeon  Anesthesia Plan Comments: (Plan routine monitors,  GETA)        Anesthesia Quick Evaluation

## 2013-01-14 NOTE — Anesthesia Procedure Notes (Signed)
Procedure Name: Intubation Date/Time: 01/14/2013 2:12 PM Performed by: Ellin Goodie Pre-anesthesia Checklist: Patient identified, Emergency Drugs available, Suction available, Patient being monitored and Timeout performed Patient Re-evaluated:Patient Re-evaluated prior to inductionOxygen Delivery Method: Circle system utilized Preoxygenation: Pre-oxygenation with 100% oxygen Intubation Type: IV induction Ventilation: Mask ventilation without difficulty Laryngoscope Size: Mac and 3 Grade View: Grade I Tube type: Oral Tube size: 7.5 mm Number of attempts: 1 Airway Equipment and Method: Stylet Placement Confirmation: ETT inserted through vocal cords under direct vision,  positive ETCO2 and breath sounds checked- equal and bilateral Secured at: 22 cm Tube secured with: Tape Dental Injury: Teeth and Oropharynx as per pre-operative assessment  Comments: Easy atraumatic induction and intubation with MAC 3 blade.  Dr. Jean Rosenthal verified placement of ETT.  Carlynn Herald, CRNA

## 2013-01-14 NOTE — Op Note (Signed)
Preop diagnosis: Herniated disc L4-5 right Postop diagnosis: Same Procedure: Right L4-5 intralaminar laminotomy for excision of herniated disc with operating microscope Surgeon: Emrey Thornley Assistant: Pool  After being placed the prone position the patient's back was prepped and draped in usual sterile fashion. Localizing x-rays taken prior to incision to identify the appropriate level. Midline incision was made above the spinous processes of L4 and L5. Incision was carried on the spinous processes. Subperiosteal dissection was then carried out along the right side of the spinous processes and lamina self-retaining tract was placed for exposure. X-ray showed approach the appropriate level. Using the high-speed drill the inferior one third of the L4 lamina the medial one third centering superior one third of the L5 lamina were removed. Residual bone and ligamentum flavum removed in a piecemeal fashion. The microscope was draped brought in the field and used for the remainder of the case. Using microdissection technique the lateral thecal sac and L5 nerve were identified. Further coagulation was carried out down before the canal to identify the L4-5 disc with some tremendously herniated. After coagulating the annulus this incised a 15 blade. Using pituitary rongeurs and curettes thorough displaced cleanout was carried out while the same time great care was taken to avoid injury to the neural elements this was successfully done. At this time inspection was carried out in all directions for any evidence of residual compression and none could be identified. Irrigation was carried out and any bleeding control proper coagulation Gelfoam. The was then closed in multiple layers of Vicryl on the muscle fascia subcutaneous and subcuticular tissues. Dermabond and Steri-Strips were placed on the skin. A sterile dressing was then applied and the patient was extubated and taken to recovery in stable condition.

## 2013-01-15 MED ORDER — HYDROCODONE-ACETAMINOPHEN 5-325 MG PO TABS: 1.0000 | ORAL_TABLET | ORAL | Status: DC | PRN

## 2013-01-15 MED ORDER — POLYETHYLENE GLYCOL 3350 17 G PO PACK
17.0000 g | PACK | Freq: Every day | ORAL | Status: DC
  Filled 2013-01-15: qty 1

## 2013-01-15 MED ORDER — CYCLOBENZAPRINE HCL 10 MG PO TABS: 10.0000 mg | ORAL_TABLET | Freq: Three times a day (TID) | ORAL | Status: DC | PRN

## 2013-01-15 NOTE — Discharge Summary (Signed)
Physician Discharge Summary  Patient ID: April Soto MRN: [id number] DOB/AGE: [birth year] 52 y.o.  Admit date: 01/14/2013 Discharge date: 01/15/2013  Admission Diagnoses:Herniated disc L4-5 right   Discharge Diagnoses: Herniated disc L4-5 right  Active Problems:   * No active hospital problems. *   Discharged Condition: good  Hospital Course: pt admitted on day of surgery  - underwent procedure below  - pt with some improvement in leg pain   - ambulating, voiding, tolerating PO  Consults: None   Treatments: surgery:Right L4-5 intralaminar laminotomy for excision of herniated disc with operating microscope   Discharge Exam: Blood pressure 109/77, pulse 76, temperature 98.2 F (36.8 C), temperature source Oral, resp. rate 16, height 5\' 6"  (1.676 m), weight 107.14 kg (236 lb 3.2 oz), SpO2 94.00%. Wound:c/d/i  Disposition: home     Medication List         acyclovir 400 MG tablet  Commonly known as:  ZOVIRAX  Take 400 mg by mouth 2 (two) times daily.     carvedilol 12.5 MG tablet  Commonly known as:  COREG  Take 12.5 mg by mouth 2 (two) times daily with a meal.     cetirizine 10 MG tablet  Commonly known as:  ZYRTEC  Take 10 mg by mouth daily.     cyclobenzaprine 10 MG tablet  Commonly known as:  FLEXERIL  Take 10 mg by mouth 3 (three) times daily as needed for muscle spasms.     cyclobenzaprine 10 MG tablet  Commonly known as:  FLEXERIL  Take 1 tablet (10 mg total) by mouth 3 (three) times daily as needed for muscle spasms.     diclofenac 75 MG EC tablet  Commonly known as:  VOLTAREN  Take 75 mg by mouth 2 (two) times daily.     DYMISTA 137-50 MCG/ACT Susp  Generic drug:  Azelastine-Fluticasone  Place 1 spray into the nose daily.     esomeprazole 40 MG capsule  Commonly known as:  NEXIUM  Take 40 mg by mouth daily before breakfast.     FLUoxetine 20 MG tablet  Commonly known as:  PROZAC  Take 40 mg by mouth daily.     hydrochlorothiazide 25 MG  tablet  Commonly known as:  HYDRODIURIL  Take 25 mg by mouth daily.     HYDROcodone-acetaminophen 5-325 MG per tablet  Commonly known as:  NORCO/VICODIN  Take 1-2 tablets by mouth every 4 (four) hours as needed.     levothyroxine 50 MCG tablet  Commonly known as:  SYNTHROID, LEVOTHROID  Take 50 mcg by mouth daily before breakfast.     LORazepam 1 MG tablet  Commonly known as:  ATIVAN  Take 1 mg by mouth 3 (three) times daily.     oxyCODONE-acetaminophen 5-325 MG per tablet  Commonly known as:  PERCOCET/ROXICET  Take 1 tablet by mouth 3 (three) times daily.     pregabalin 150 MG capsule  Commonly known as:  LYRICA  Take 150 mg by mouth 3 (three) times daily.     RA COL-RITE 100 MG capsule  Generic drug:  docusate sodium  Take 200 mg by mouth daily.     simvastatin 40 MG tablet  Commonly known as:  ZOCOR  Take 40 mg by mouth every evening.         Signed: Clydene Fake, MD 01/15/2013, 9:11 AM

## 2013-01-15 NOTE — Progress Notes (Signed)
Patient prescriptions for flexeril and vicodin given to patient.  Discharge education and paperwork completed.  Patient wheeled down for discharge and sister driving sister home. Patient given 0800 medications and pain medication.  She declined her 1000 medications and said she would take them on her own schedule once home.  Lance Bosch, RN

## 2013-01-18 ENCOUNTER — Encounter (HOSPITAL_COMMUNITY): Payer: Self-pay | Admitting: Neurosurgery

## 2013-01-18 NOTE — Anesthesia Postprocedure Evaluation (Signed)
  Anesthesia Post-op Note  Patient: April Soto  Procedure(s) Performed: Procedure(s) with comments: RIGHT LUMBAR FOUR FIVE  LAMINECTOMY/DECOMPRESSION MICRODISCECTOMY 1 LEVEL (Right) - RIGHT L45 microdiskectomy  Patient discharged with no reported anesthetic complications

## 2013-02-23 ENCOUNTER — Other Ambulatory Visit: Payer: Self-pay | Admitting: Neurosurgery

## 2013-02-23 DIAGNOSIS — M549 Dorsalgia, unspecified: Secondary | ICD-10-CM

## 2013-03-07 ENCOUNTER — Ambulatory Visit
Admission: RE | Admit: 2013-03-07 | Discharge: 2013-03-07 | Disposition: A | Discharge status: Home / Self Care | Payer: PRIVATE HEALTH INSURANCE | Source: Ambulatory Visit | Attending: Neurosurgery | Admitting: Neurosurgery

## 2013-03-07 DIAGNOSIS — M549 Dorsalgia, unspecified: Secondary | ICD-10-CM

## 2013-03-07 MED ORDER — GADOBENATE DIMEGLUMINE 529 MG/ML IV SOLN
20.0000 mL | Freq: Once | INTRAVENOUS | Status: AC | PRN
  Administered 2013-03-07: 20 mL via INTRAVENOUS

## 2013-03-30 ENCOUNTER — Emergency Department: Payer: Self-pay | Admitting: Emergency Medicine

## 2013-05-04 DIAGNOSIS — M1990 Unspecified osteoarthritis, unspecified site: Secondary | ICD-10-CM | POA: Insufficient documentation

## 2013-05-11 ENCOUNTER — Other Ambulatory Visit: Payer: Self-pay | Admitting: Neurosurgery

## 2013-05-11 DIAGNOSIS — M549 Dorsalgia, unspecified: Secondary | ICD-10-CM

## 2013-05-17 ENCOUNTER — Ambulatory Visit
Admission: RE | Admit: 2013-05-17 | Discharge: 2013-05-17 | Disposition: A | Discharge status: Home / Self Care | Payer: PRIVATE HEALTH INSURANCE | Source: Ambulatory Visit | Attending: Neurosurgery | Admitting: Neurosurgery

## 2013-05-17 VITALS — BP 107/70 | HR 66

## 2013-05-17 DIAGNOSIS — M549 Dorsalgia, unspecified: Secondary | ICD-10-CM

## 2013-05-17 MED ORDER — ONDANSETRON HCL 4 MG/2ML IJ SOLN
4.0000 mg | Freq: Once | INTRAMUSCULAR | Status: AC
  Administered 2013-05-17: 4 mg via INTRAMUSCULAR

## 2013-05-17 MED ORDER — ONDANSETRON HCL 4 MG/2ML IJ SOLN
4.0000 mg | Freq: Four times a day (QID) | INTRAMUSCULAR | Status: DC | PRN
Start: 2013-05-17 — End: 2013-05-18

## 2013-05-17 MED ORDER — DIAZEPAM 5 MG PO TABS: 10.0000 mg | ORAL_TABLET | Freq: Once | ORAL | Status: DC

## 2013-05-17 MED ORDER — DIAZEPAM 5 MG PO TABS
10.0000 mg | ORAL_TABLET | Freq: Once | ORAL | Status: AC
  Administered 2013-05-17: 10 mg via ORAL

## 2013-05-17 MED ORDER — MEPERIDINE HCL 100 MG/ML IJ SOLN
100.0000 mg | Freq: Once | INTRAMUSCULAR | Status: AC
  Administered 2013-05-17: 100 mg via INTRAMUSCULAR

## 2013-05-17 MED ORDER — IOHEXOL 180 MG/ML  SOLN
15.0000 mL | Freq: Once | INTRAMUSCULAR | Status: AC | PRN
Start: 2013-05-17 — End: 2013-05-17
  Administered 2013-05-17: 15 mL via INTRATHECAL

## 2013-05-17 NOTE — Discharge Instructions (Signed)
Myelogram Discharge Instructions  1. Go home and rest quietly for the next 24 hours.  It is important to lie flat for the next 24 hours.  Get up only to go to the restroom.  You may lie in the bed or on a couch on your back, your stomach, your left side or your right side.  You may have one pillow under your head.  You may have pillows between your knees while you are on your side or under your knees while you are on your back.  2. DO NOT drive today.  Recline the seat as far back as it will go, while still wearing your seat belt, on the way home.  3. You may get up to go to the bathroom as needed.  You may sit up for 10 minutes to eat.  You may resume your normal diet and medications unless otherwise indicated.  Drink lots of extra fluids today and tomorrow.  4. The incidence of headache, nausea, or vomiting is about 5% (one in 20 patients).  If you develop a headache, lie flat and drink plenty of fluids until the headache goes away.  Caffeinated beverages may be helpful.  If you develop severe nausea and vomiting or a headache that does not go away with flat bed rest, call [phone].  5. You may resume normal activities after your 24 hours of bed rest is over; however, do not exert yourself strongly or do any heavy lifting tomorrow. If when you get up you have a headache when standing, go back to bed and force fluids for another 24 hours.  6. Call your physician for a follow-up appointment.  The results of your myelogram will be sent directly to your physician by the following day.  7. If you have any questions or if complications develop after you arrive home, please call [phone].  Discharge instructions have been explained to the patient.  The patient, or the person responsible for the patient, fully understands these instructions.      May resume prozac on Feb. 4, 2014, after 1:00 pm.

## 2013-05-17 NOTE — Progress Notes (Signed)
Patient states she has been off Prozac since Friday when the medication was changed to [facility]; patient has been off this for two days.

## 2013-05-25 ENCOUNTER — Other Ambulatory Visit: Payer: Self-pay | Admitting: Neurosurgery

## 2013-06-15 ENCOUNTER — Inpatient Hospital Stay (HOSPITAL_COMMUNITY)
Admission: RE | Admit: 2013-06-15 | Discharge: 2013-06-15 | Disposition: A | Discharge status: Home / Self Care | Payer: PRIVATE HEALTH INSURANCE | Source: Ambulatory Visit

## 2013-06-15 ENCOUNTER — Encounter (HOSPITAL_COMMUNITY): Payer: Self-pay

## 2013-06-15 ENCOUNTER — Encounter (HOSPITAL_COMMUNITY): Payer: Self-pay | Admitting: *Deleted

## 2013-06-15 HISTORY — DX: Constipation, unspecified: K59.00

## 2013-06-15 HISTORY — DX: Other seasonal allergic rhinitis: J30.2

## 2013-06-15 HISTORY — DX: Other muscle spasm: M62.838

## 2013-06-15 NOTE — Progress Notes (Signed)
Pt doesn't have a cardiologist  Stress test done [facility]341yrs ago at [facility] [place]  Denies ever having an echo or heart cath  Medical Md is with [facility] Dr[facility]N Welton FlakesKhan  EKG in epic form 01-14-13  CXR in epic from 01-10-13  Sleep study in epic under media tab but pt states was told it was neg and doesn't use a cpap

## 2013-06-15 NOTE — Pre-Procedure Instructions (Signed)
April Soto  06/15/2013   Your procedure is scheduled on:  Fri, Mar 13 @ 11:35 AM  Report to [facility] Short Stay Entrance A at 9:30AM.  Call this number if you have problems the morning of surgery: [phone]   Remember:   Do not eat food or drink liquids after midnight.   Take these medicines the morning of surgery with A SIP OF WATER: Acyclovir(Zovirax),Dymista(Azelastine-Fluticasone),Carvedilol(Coreg),Zyrtec(Cetirizine),Nexium(Esomeprazole),Prozac(Fluoxetine),Pain Pill(if needed),Synthroid(Levothyroxine),Ativan(Lorazepam),and Lyrica(Pregabalin)               Stop taking your Diclofenac.No Goody's,BC's,Aleve,Aspirin,Ibuprofen,Fish Oil,or any Herbal Medications   Do not wear jewelry, make-up or nail polish.  Do not wear lotions, powders, or perfumes. You may wear deodorant.  Do not shave 48 hours prior to surgery.   Do not bring valuables to the hospital.  [facility] is not responsible                  for any belongings or valuables.               Contacts, dentures or bridgework may not be worn into surgery.  Leave suitcase in the car. After surgery it may be brought to your room.  For patients admitted to the hospital, discharge time is determined by your                treatment team.              [facility] - Preparing for Surgery  Before surgery, you can play an important role.  Because skin is not sterile, your skin needs to be as free of germs as possible.  You can reduce the number of germs on you skin by washing with CHG (chlorahexidine gluconate) soap before surgery.  CHG is an antiseptic cleaner which kills germs and bonds with the skin to continue killing germs even after washing.  Please DO NOT use if you have an allergy to CHG or antibacterial soaps.  If your skin becomes reddened/irritated stop using the CHG and inform your nurse when you arrive at Short Stay.  Do not shave (including legs and underarms) for at least 48 hours prior to the first CHG shower.  You  may shave your face.  Please follow these instructions carefully:   1.  Shower with CHG Soap the night before surgery and the                                morning of Surgery.  2.  If you choose to wash your hair, wash your hair first as usual with your       normal shampoo.  3.  After you shampoo, rinse your hair and body thoroughly to remove the                      Shampoo.  4.  Use CHG as you would any other liquid soap.  You can apply chg directly       to the skin and wash gently with scrungie or a clean washcloth.  5.  Apply the CHG Soap to your body ONLY FROM THE NECK DOWN.        Do not use on open wounds or open sores.  Avoid contact with your eyes,       ears, mouth and genitals (private parts).  Wash genitals (private parts)       with your normal soap.  6.  Wash thoroughly, paying special attention to the area where your surgery        will be performed.  7.  Thoroughly rinse your body with warm water from the neck down.  8.  DO NOT shower/wash with your normal soap after using and rinsing off       the CHG Soap.  9.  Pat yourself dry with a clean towel.            10.  Wear clean pajamas.            11.  Place clean sheets on your bed the night of your first shower and do not        sleep with pets.  Day of Surgery  Do not apply any lotions/deoderants the morning of surgery.  Please wear clean clothes to the hospital/surgery center.    Special Instructions:    Please read over the following fact sheets that you were given: Pain Booklet, Coughing and Deep Breathing, Blood Transfusion Information, MRSA Information and Surgical Site Infection Prevention

## 2013-06-20 ENCOUNTER — Encounter (HOSPITAL_COMMUNITY)
Admission: RE | Admit: 2013-06-20 | Discharge: 2013-06-20 | Disposition: A | Discharge status: Home / Self Care | Payer: PRIVATE HEALTH INSURANCE | Source: Ambulatory Visit | Attending: Neurosurgery | Admitting: Neurosurgery

## 2013-06-20 DIAGNOSIS — Z01812 Encounter for preprocedural laboratory examination: Secondary | ICD-10-CM | POA: Insufficient documentation

## 2013-06-20 LAB — BASIC METABOLIC PANEL
BUN: 10 mg/dL (ref 6–23)
CO2: 28 mEq/L (ref 19–32)
Calcium: 9 mg/dL (ref 8.4–10.5)
Chloride: 102 mEq/L (ref 96–112)
Creatinine, Ser: 0.67 mg/dL (ref 0.50–1.10)
Glucose, Bld: 95 mg/dL (ref 70–99)
POTASSIUM: 3.8 meq/L (ref 3.7–5.3)
SODIUM: 140 meq/L (ref 137–147)

## 2013-06-20 LAB — CBC
HCT: 41.7 % (ref 36.0–46.0)
HEMOGLOBIN: 14.1 g/dL (ref 12.0–15.0)
MCH: 31.8 pg (ref 26.0–34.0)
MCHC: 33.8 g/dL (ref 30.0–36.0)
MCV: 93.9 fL (ref 78.0–100.0)
Platelets: 185 10*3/uL (ref 150–400)
RBC: 4.44 MIL/uL (ref 3.87–5.11)
RDW: 14.6 % (ref 11.5–15.5)
WBC: 6.9 10*3/uL (ref 4.0–10.5)

## 2013-06-20 LAB — SURGICAL PCR SCREEN
MRSA, PCR: NEGATIVE
STAPHYLOCOCCUS AUREUS: NEGATIVE

## 2013-06-20 LAB — TYPE AND SCREEN
ABO/RH(D): A POS
ANTIBODY SCREEN: NEGATIVE

## 2013-06-20 LAB — ABO/RH: ABO/RH(D): A POS

## 2013-06-23 MED ORDER — SODIUM CHLORIDE 0.9 % IV SOLN
1500.0000 mg | INTRAVENOUS | Status: AC
  Filled 2013-06-23: qty 1500

## 2013-06-23 MED ORDER — DEXAMETHASONE SODIUM PHOSPHATE 10 MG/ML IJ SOLN: 10.0000 mg | INTRAMUSCULAR | Status: AC

## 2013-06-24 ENCOUNTER — Inpatient Hospital Stay (HOSPITAL_COMMUNITY): Admission: RE | Admit: 2013-06-24 | Payer: PRIVATE HEALTH INSURANCE | Source: Ambulatory Visit | Admitting: Neurosurgery

## 2013-06-24 HISTORY — DX: Edema, unspecified: R60.9

## 2013-06-24 HISTORY — DX: Personal history of other diseases of the respiratory system: Z87.09

## 2013-06-24 HISTORY — DX: Pain in unspecified joint: M25.50

## 2013-06-24 HISTORY — DX: Localized edema: R60.0

## 2013-06-24 HISTORY — DX: Dorsalgia, unspecified: M54.9

## 2013-06-24 HISTORY — DX: Weakness: R53.1

## 2013-06-24 HISTORY — DX: Pneumonia, unspecified organism: J18.9

## 2013-06-24 HISTORY — DX: Nocturia: R35.1

## 2013-06-24 HISTORY — DX: Other chronic pain: G89.29

## 2013-06-24 HISTORY — DX: Headache: R51

## 2013-06-24 HISTORY — DX: Unspecified urinary incontinence: R32

## 2013-06-24 SURGERY — LUMBAR LAMINECTOMY/DECOMPRESSION MICRODISCECTOMY 1 LEVEL
Anesthesia: General | Site: Back | Laterality: Right

## 2013-07-05 ENCOUNTER — Ambulatory Visit (INDEPENDENT_AMBULATORY_CARE_PROVIDER_SITE_OTHER): Payer: PRIVATE HEALTH INSURANCE | Admitting: Infectious Disease

## 2013-07-05 ENCOUNTER — Encounter: Payer: Self-pay | Admitting: Infectious Disease

## 2013-07-05 VITALS — BP 135/94 | HR 74 | Temp 98.0°F | Ht 66.0 in | Wt 238.0 lb

## 2013-07-05 DIAGNOSIS — R21 Rash and other nonspecific skin eruption: Secondary | ICD-10-CM

## 2013-07-05 DIAGNOSIS — Z22322 Carrier or suspected carrier of Methicillin resistant Staphylococcus aureus: Secondary | ICD-10-CM

## 2013-07-05 DIAGNOSIS — Z209 Contact with and (suspected) exposure to unspecified communicable disease: Secondary | ICD-10-CM

## 2013-07-05 DIAGNOSIS — Z113 Encounter for screening for infections with a predominantly sexual mode of transmission: Secondary | ICD-10-CM | POA: Insufficient documentation

## 2013-07-05 DIAGNOSIS — Z22321 Carrier or suspected carrier of Methicillin susceptible Staphylococcus aureus: Secondary | ICD-10-CM | POA: Insufficient documentation

## 2013-07-05 NOTE — Patient Instructions (Signed)
   i WOULD SEE THE DERMATOLOGIST AGAIN  BEFORE ANY ELECTIVE SURGERY  MAKE SURE THAT YOU GET HIBICLENS SHOWERS EVERY NIGHT FOR 7 NIGHTS PRIOR TO SURGERY  AND APPLY INTRANASAL MUPIROCIN TWICE DAILY FOR 7 DAYS PRIOR TO SURGERY  AND  THAT THE ANESTHESIOLOGIST GIVES YOU AN ANTIBIOTIC SUCH AS VANCOMYCIN, DAPTOMYCIN OR TEFLARO PRIOR TO SURGERY TO COVER FOR MRSA

## 2013-07-05 NOTE — Progress Notes (Signed)
   Subjective:    Patient ID: April Soto, female    DOB: 03/29/1961, 53 y.o.   MRN: [id number]  HPI  53 year old with hx of MRSA nasal swab screen prior to miscodisckectomy last year by Dr. [name]. She has cutaneous lesions that come and go on her legs, arms that are maculopapular. None of these have drained purulent material from them. None have been cultured. She has seen [facility] and then did biopsy that per her did not show a specific cause.   I am skeptical that these actually represent MRSA infection.  She has wisely been checked for HIV and found to be HIV negative. She did wish for Hepatitis testing which we will perform as well.  Review of Systems  Constitutional: Negative for fever, chills, diaphoresis, activity change, appetite change, fatigue and unexpected weight change.  HENT: Negative for congestion, rhinorrhea, sinus pressure, sneezing, sore throat and trouble swallowing.   Eyes: Negative for photophobia and visual disturbance.  Respiratory: Negative for cough, chest tightness, shortness of breath, wheezing and stridor.   Cardiovascular: Negative for chest pain, palpitations and leg swelling.  Gastrointestinal: Negative for nausea, vomiting, abdominal pain, diarrhea, constipation, blood in stool, abdominal distention and anal bleeding.  Genitourinary: Negative for dysuria, hematuria, flank pain and difficulty urinating.  Musculoskeletal: Negative for arthralgias, back pain, gait problem, joint swelling and myalgias.  Skin: Positive for rash. Negative for color change, pallor and wound.  Neurological: Negative for dizziness, tremors, weakness and light-headedness.  Hematological: Negative for adenopathy. Does not bruise/bleed easily.  Psychiatric/Behavioral: Negative for behavioral problems, confusion, sleep disturbance, dysphoric mood, decreased concentration and agitation.       Objective:   Physical Exam  Nursing note and vitals  reviewed. Constitutional: She is oriented to person, place, and time. She appears well-developed and well-nourished. No distress.  HENT:  Head: Normocephalic and atraumatic.  Eyes: Conjunctivae and EOM are normal. Pupils are equal, round, and reactive to light.  Neck: Normal range of motion. Neck supple.  Cardiovascular: Normal rate and regular rhythm.   Pulmonary/Chest: Effort normal. No respiratory distress. She has no wheezes. She exhibits no tenderness.  Abdominal: Soft. Bowel sounds are normal. She exhibits no distension.  Musculoskeletal: She exhibits no edema and no tenderness.  Neurological: She is alert and oriented to person, place, and time. She exhibits normal muscle tone. Coordination normal.  Skin: Skin is warm and dry. Rash noted. She is not diaphoretic. No erythema. No pallor.  Psychiatric: She has a normal mood and affect. Her behavior is normal. Judgment and thought content normal.   Macule on leg:           Assessment & Plan:   MRSA colonization: she should do decolonization regimen prior to elective surgery with hibiclens and mupirocin IN and x 7 days preop and have preop anti MRSA abx such as vancomycin, daptomycin or teflaro  Rash: not clear to me that this is related to MRSA. Would like her to see her Dermatologist. She wonders also about tinea and I told her she could try topical antifungal such as lotrimin

## 2013-07-06 LAB — HEPATITIS PANEL, ACUTE
HCV Ab: NEGATIVE
HEP B S AG: NEGATIVE
Hep A IgM: NONREACTIVE
Hep B C IgM: NONREACTIVE

## 2013-07-08 DIAGNOSIS — Z0189 Encounter for other specified special examinations: Secondary | ICD-10-CM | POA: Insufficient documentation

## 2013-11-08 ENCOUNTER — Ambulatory Visit: Payer: Self-pay | Admitting: Nurse Practitioner

## 2013-12-13 HISTORY — PX: FOOT SURGERY: SHX648

## 2013-12-22 DIAGNOSIS — M25373 Other instability, unspecified ankle: Secondary | ICD-10-CM | POA: Insufficient documentation

## 2014-03-02 DIAGNOSIS — E039 Hypothyroidism, unspecified: Secondary | ICD-10-CM | POA: Insufficient documentation

## 2014-03-03 DIAGNOSIS — G894 Chronic pain syndrome: Secondary | ICD-10-CM | POA: Insufficient documentation

## 2014-03-03 DIAGNOSIS — E66811 Obesity, class 1: Secondary | ICD-10-CM | POA: Insufficient documentation

## 2014-03-03 DIAGNOSIS — E6609 Other obesity due to excess calories: Secondary | ICD-10-CM | POA: Insufficient documentation

## 2014-03-03 DIAGNOSIS — Z6833 Body mass index (BMI) 33.0-33.9, adult: Secondary | ICD-10-CM | POA: Insufficient documentation

## 2014-03-05 DIAGNOSIS — M79606 Pain in leg, unspecified: Secondary | ICD-10-CM | POA: Insufficient documentation

## 2014-03-11 DIAGNOSIS — M25551 Pain in right hip: Secondary | ICD-10-CM | POA: Insufficient documentation

## 2014-03-11 DIAGNOSIS — M797 Fibromyalgia: Secondary | ICD-10-CM | POA: Insufficient documentation

## 2014-03-25 ENCOUNTER — Emergency Department: Payer: Self-pay | Admitting: Emergency Medicine

## 2014-03-25 LAB — URINALYSIS, COMPLETE
BACTERIA: NONE SEEN
BLOOD: NEGATIVE
Bilirubin,UR: NEGATIVE
GLUCOSE, UR: NEGATIVE mg/dL (ref 0–75)
Hyaline Cast: 2
KETONE: NEGATIVE
Leukocyte Esterase: NEGATIVE
Nitrite: NEGATIVE
PH: 5 (ref 4.5–8.0)
PROTEIN: NEGATIVE
RBC,UR: <1 /HPF (ref 0–5)
Specific Gravity: 1.011 (ref 1.003–1.030)
Squamous Epithelial: 2
WBC UR: 1 /HPF (ref 0–5)

## 2014-03-25 LAB — CBC WITH DIFFERENTIAL/PLATELET
Eosinophil: 1 %
HCT: 51.1 % — AB (ref 35.0–47.0)
HGB: 17 g/dL — ABNORMAL HIGH (ref 12.0–16.0)
Lymphocytes: 45 %
MCH: 30.7 pg (ref 26.0–34.0)
MCHC: 33.4 g/dL (ref 32.0–36.0)
MCV: 92 fL (ref 80–100)
MONOS PCT: 5 %
Platelet: 265 10*3/uL (ref 150–440)
RBC: 5.54 10*6/uL — ABNORMAL HIGH (ref 3.80–5.20)
RDW: 15.4 % — ABNORMAL HIGH (ref 11.5–14.5)
Segmented Neutrophils: 35 %
VARIANT LYMPHOCYTE - H1-RLYMPH: 14 %
WBC: 11 10*3/uL (ref 3.6–11.0)

## 2014-03-25 LAB — COMPREHENSIVE METABOLIC PANEL
Albumin: 3.7 g/dL (ref 3.4–5.0)
Alkaline Phosphatase: 94 U/L
Anion Gap: 12 (ref 7–16)
BILIRUBIN TOTAL: 0.4 mg/dL (ref 0.2–1.0)
BUN: 8 mg/dL (ref 7–18)
CHLORIDE: 103 mmol/L (ref 98–107)
CREATININE: 0.87 mg/dL (ref 0.60–1.30)
Calcium, Total: 9.4 mg/dL (ref 8.5–10.1)
Co2: 25 mmol/L (ref 21–32)
EGFR (African American): >60
EGFR (Non-African Amer.): >60
GLUCOSE: 109 mg/dL — AB (ref 65–99)
OSMOLALITY: 278 (ref 275–301)
POTASSIUM: 3.7 mmol/L (ref 3.5–5.1)
SGOT(AST): 23 U/L (ref 15–37)
SGPT (ALT): 23 U/L
SODIUM: 140 mmol/L (ref 136–145)
Total Protein: 7.8 g/dL (ref 6.4–8.2)

## 2014-03-30 ENCOUNTER — Observation Stay: Payer: Self-pay | Admitting: Internal Medicine

## 2014-03-30 LAB — COMPREHENSIVE METABOLIC PANEL
ALT: 20 U/L
Albumin: 3.4 g/dL (ref 3.4–5.0)
Alkaline Phosphatase: 78 U/L
Anion Gap: 6 — ABNORMAL LOW (ref 7–16)
BUN: 7 mg/dL (ref 7–18)
Bilirubin,Total: 0.3 mg/dL (ref 0.2–1.0)
CALCIUM: 8.7 mg/dL (ref 8.5–10.1)
CHLORIDE: 106 mmol/L (ref 98–107)
CO2: 28 mmol/L (ref 21–32)
Creatinine: 0.63 mg/dL (ref 0.60–1.30)
GLUCOSE: 96 mg/dL (ref 65–99)
Osmolality: 277 (ref 275–301)
Potassium: 3.7 mmol/L (ref 3.5–5.1)
SGOT(AST): 18 U/L (ref 15–37)
Sodium: 140 mmol/L (ref 136–145)
Total Protein: 6.7 g/dL (ref 6.4–8.2)

## 2014-03-30 LAB — CBC
HCT: 47 % (ref 35.0–47.0)
HGB: 15.5 g/dL (ref 12.0–16.0)
MCH: 30.3 pg (ref 26.0–34.0)
MCHC: 32.9 g/dL (ref 32.0–36.0)
MCV: 92 fL (ref 80–100)
Platelet: 174 10*3/uL (ref 150–440)
RBC: 5.11 10*6/uL (ref 3.80–5.20)
RDW: 15.4 % — ABNORMAL HIGH (ref 11.5–14.5)
WBC: 6 10*3/uL (ref 3.6–11.0)

## 2014-04-01 ENCOUNTER — Inpatient Hospital Stay
Admission: EM | Admit: 2014-04-01 | Payer: PRIVATE HEALTH INSURANCE | Source: Other Acute Inpatient Hospital | Admitting: Internal Medicine

## 2014-04-02 ENCOUNTER — Encounter (HOSPITAL_COMMUNITY): Payer: Self-pay | Admitting: *Deleted

## 2014-04-02 ENCOUNTER — Inpatient Hospital Stay (HOSPITAL_COMMUNITY)
Admission: EM | Admit: 2014-04-02 | Discharge: 2014-04-05 | DRG: 552 | Disposition: A | Discharge status: Home / Self Care | Payer: PRIVATE HEALTH INSURANCE | Attending: Internal Medicine | Admitting: Internal Medicine

## 2014-04-02 DIAGNOSIS — M797 Fibromyalgia: Secondary | ICD-10-CM | POA: Diagnosis present

## 2014-04-02 DIAGNOSIS — J45909 Unspecified asthma, uncomplicated: Secondary | ICD-10-CM | POA: Diagnosis present

## 2014-04-02 DIAGNOSIS — E785 Hyperlipidemia, unspecified: Secondary | ICD-10-CM | POA: Diagnosis present

## 2014-04-02 DIAGNOSIS — Z888 Allergy status to other drugs, medicaments and biological substances status: Secondary | ICD-10-CM

## 2014-04-02 DIAGNOSIS — E039 Hypothyroidism, unspecified: Secondary | ICD-10-CM | POA: Diagnosis present

## 2014-04-02 DIAGNOSIS — M549 Dorsalgia, unspecified: Secondary | ICD-10-CM | POA: Diagnosis present

## 2014-04-02 DIAGNOSIS — G8929 Other chronic pain: Secondary | ICD-10-CM | POA: Diagnosis present

## 2014-04-02 DIAGNOSIS — K219 Gastro-esophageal reflux disease without esophagitis: Secondary | ICD-10-CM | POA: Diagnosis present

## 2014-04-02 DIAGNOSIS — F319 Bipolar disorder, unspecified: Secondary | ICD-10-CM | POA: Diagnosis present

## 2014-04-02 DIAGNOSIS — M5136 Other intervertebral disc degeneration, lumbar region: Secondary | ICD-10-CM | POA: Diagnosis present

## 2014-04-02 DIAGNOSIS — Z9049 Acquired absence of other specified parts of digestive tract: Secondary | ICD-10-CM | POA: Diagnosis present

## 2014-04-02 DIAGNOSIS — M1990 Unspecified osteoarthritis, unspecified site: Secondary | ICD-10-CM | POA: Diagnosis present

## 2014-04-02 DIAGNOSIS — Z8673 Personal history of transient ischemic attack (TIA), and cerebral infarction without residual deficits: Secondary | ICD-10-CM

## 2014-04-02 DIAGNOSIS — M51369 Other intervertebral disc degeneration, lumbar region without mention of lumbar back pain or lower extremity pain: Secondary | ICD-10-CM | POA: Diagnosis present

## 2014-04-02 DIAGNOSIS — E119 Type 2 diabetes mellitus without complications: Secondary | ICD-10-CM | POA: Diagnosis present

## 2014-04-02 DIAGNOSIS — F17210 Nicotine dependence, cigarettes, uncomplicated: Secondary | ICD-10-CM | POA: Diagnosis present

## 2014-04-02 DIAGNOSIS — M5116 Intervertebral disc disorders with radiculopathy, lumbar region: Secondary | ICD-10-CM | POA: Diagnosis not present

## 2014-04-02 DIAGNOSIS — I10 Essential (primary) hypertension: Secondary | ICD-10-CM | POA: Diagnosis present

## 2014-04-02 DIAGNOSIS — Z881 Allergy status to other antibiotic agents status: Secondary | ICD-10-CM | POA: Diagnosis not present

## 2014-04-02 DIAGNOSIS — Z90710 Acquired absence of both cervix and uterus: Secondary | ICD-10-CM

## 2014-04-02 DIAGNOSIS — M5441 Lumbago with sciatica, right side: Secondary | ICD-10-CM

## 2014-04-02 DIAGNOSIS — F419 Anxiety disorder, unspecified: Secondary | ICD-10-CM | POA: Diagnosis present

## 2014-04-02 DIAGNOSIS — E876 Hypokalemia: Secondary | ICD-10-CM | POA: Diagnosis present

## 2014-04-02 LAB — BASIC METABOLIC PANEL
Anion gap: 13 (ref 5–15)
BUN: 11 mg/dL (ref 6–23)
CALCIUM: 8.7 mg/dL (ref 8.4–10.5)
CO2: 26 mEq/L (ref 19–32)
Chloride: 101 mEq/L (ref 96–112)
Creatinine, Ser: 0.56 mg/dL (ref 0.50–1.10)
Glucose, Bld: 119 mg/dL — ABNORMAL HIGH (ref 70–99)
Potassium: 3 mEq/L — ABNORMAL LOW (ref 3.7–5.3)
SODIUM: 140 meq/L (ref 137–147)

## 2014-04-02 LAB — CBC WITH DIFFERENTIAL/PLATELET
BASOS PCT: 0 % (ref 0–1)
Basophils Absolute: 0 10*3/uL (ref 0.0–0.1)
EOS ABS: 0.1 10*3/uL (ref 0.0–0.7)
Eosinophils Relative: 1 % (ref 0–5)
HCT: 42.5 % (ref 36.0–46.0)
Hemoglobin: 13.9 g/dL (ref 12.0–15.0)
LYMPHS ABS: 3 10*3/uL (ref 0.7–4.0)
Lymphocytes Relative: 42 % (ref 12–46)
MCH: 29.7 pg (ref 26.0–34.0)
MCHC: 32.7 g/dL (ref 30.0–36.0)
MCV: 90.8 fL (ref 78.0–100.0)
Monocytes Absolute: 0.8 10*3/uL (ref 0.1–1.0)
Monocytes Relative: 11 % (ref 3–12)
Neutro Abs: 3.2 10*3/uL (ref 1.7–7.7)
Neutrophils Relative %: 46 % (ref 43–77)
PLATELETS: 161 10*3/uL (ref 150–400)
RBC: 4.68 MIL/uL (ref 3.87–5.11)
RDW: 15 % (ref 11.5–15.5)
WBC: 7 10*3/uL (ref 4.0–10.5)

## 2014-04-02 LAB — GLUCOSE, CAPILLARY: Glucose-Capillary: 163 mg/dL — ABNORMAL HIGH (ref 70–99)

## 2014-04-02 MED ORDER — ONDANSETRON HCL 4 MG/2ML IJ SOLN
4.0000 mg | Freq: Once | INTRAMUSCULAR | Status: AC
  Administered 2014-04-02: 4 mg via INTRAVENOUS
  Filled 2014-04-02: qty 2

## 2014-04-02 MED ORDER — HEPARIN SODIUM (PORCINE) 5000 UNIT/ML IJ SOLN
5000.0000 [IU] | Freq: Three times a day (TID) | INTRAMUSCULAR | Status: DC
  Administered 2014-04-03 – 2014-04-05 (×8): 5000 [IU] via SUBCUTANEOUS
  Filled 2014-04-02 (×10): qty 1

## 2014-04-02 MED ORDER — PANTOPRAZOLE SODIUM 40 MG PO TBEC
80.0000 mg | DELAYED_RELEASE_TABLET | Freq: Every day | ORAL | Status: DC
  Administered 2014-04-03 – 2014-04-05 (×3): 80 mg via ORAL
  Filled 2014-04-02 (×3): qty 2

## 2014-04-02 MED ORDER — METHYLPREDNISOLONE SODIUM SUCC 125 MG IJ SOLR
125.0000 mg | Freq: Once | INTRAMUSCULAR | Status: AC
  Administered 2014-04-02: 125 mg via INTRAVENOUS
  Filled 2014-04-02: qty 2

## 2014-04-02 MED ORDER — FLUTICASONE PROPIONATE 50 MCG/ACT NA SUSP
1.0000 | Freq: Every day | NASAL | Status: DC
  Administered 2014-04-03 – 2014-04-05 (×4): 1 via NASAL
  Filled 2014-04-02: qty 16

## 2014-04-02 MED ORDER — HYDROMORPHONE HCL 1 MG/ML IJ SOLN
1.0000 mg | INTRAMUSCULAR | Status: DC | PRN
  Administered 2014-04-02 – 2014-04-03 (×2): 1 mg via INTRAVENOUS
  Administered 2014-04-03 – 2014-04-04 (×7): 2 mg via INTRAVENOUS
  Administered 2014-04-04: 1 mg via INTRAVENOUS
  Administered 2014-04-04: 2 mg via INTRAVENOUS
  Administered 2014-04-04: 1 mg via INTRAVENOUS
  Administered 2014-04-04: 2 mg via INTRAVENOUS
  Administered 2014-04-04: 1 mg via INTRAVENOUS
  Administered 2014-04-05 (×2): 2 mg via INTRAVENOUS
  Filled 2014-04-02: qty 2
  Filled 2014-04-02 (×3): qty 1
  Filled 2014-04-02 (×11): qty 2
  Filled 2014-04-02: qty 1
  Filled 2014-04-02 (×2): qty 2

## 2014-04-02 MED ORDER — HYDROMORPHONE HCL 1 MG/ML IJ SOLN
1.0000 mg | Freq: Once | INTRAMUSCULAR | Status: AC
  Administered 2014-04-02: 1 mg via INTRAVENOUS
  Filled 2014-04-02: qty 1

## 2014-04-02 MED ORDER — POTASSIUM CHLORIDE IN NACL 20-0.9 MEQ/L-% IV SOLN
INTRAVENOUS | Status: DC
  Administered 2014-04-03: 01:00:00 via INTRAVENOUS
  Filled 2014-04-02 (×3): qty 1000

## 2014-04-02 MED ORDER — ALBUTEROL SULFATE (2.5 MG/3ML) 0.083% IN NEBU: 3.0000 mL | INHALATION_SOLUTION | RESPIRATORY_TRACT | Status: DC | PRN

## 2014-04-02 MED ORDER — PREGABALIN 75 MG PO CAPS
150.0000 mg | ORAL_CAPSULE | Freq: Three times a day (TID) | ORAL | Status: DC
  Administered 2014-04-03 – 2014-04-05 (×8): 150 mg via ORAL
  Filled 2014-04-02 (×8): qty 2

## 2014-04-02 MED ORDER — LEVOTHYROXINE SODIUM 88 MCG PO TABS
88.0000 ug | ORAL_TABLET | Freq: Every day | ORAL | Status: DC
  Administered 2014-04-03 – 2014-04-05 (×3): 88 ug via ORAL
  Filled 2014-04-02 (×4): qty 1

## 2014-04-02 MED ORDER — SODIUM CHLORIDE 0.9 % IV SOLN
INTRAVENOUS | Status: DC
  Administered 2014-04-02 – 2014-04-05 (×6): via INTRAVENOUS

## 2014-04-02 MED ORDER — ACYCLOVIR 400 MG PO TABS
400.0000 mg | ORAL_TABLET | Freq: Two times a day (BID) | ORAL | Status: DC
  Administered 2014-04-03 – 2014-04-05 (×6): 400 mg via ORAL
  Filled 2014-04-02 (×7): qty 1

## 2014-04-02 MED ORDER — AMLODIPINE BESYLATE 10 MG PO TABS
10.0000 mg | ORAL_TABLET | Freq: Every day | ORAL | Status: DC
  Administered 2014-04-03 – 2014-04-05 (×4): 10 mg via ORAL
  Filled 2014-04-02 (×4): qty 1

## 2014-04-02 MED ORDER — FLUOXETINE HCL 20 MG PO CAPS
40.0000 mg | ORAL_CAPSULE | Freq: Every day | ORAL | Status: DC
  Administered 2014-04-03 – 2014-04-04 (×3): 40 mg via ORAL
  Filled 2014-04-02 (×4): qty 2

## 2014-04-02 MED ORDER — CETYLPYRIDINIUM CHLORIDE 0.05 % MT LIQD
7.0000 mL | Freq: Two times a day (BID) | OROMUCOSAL | Status: DC
  Administered 2014-04-03 – 2014-04-04 (×4): 7 mL via OROMUCOSAL

## 2014-04-02 MED ORDER — ALBUTEROL SULFATE HFA 108 (90 BASE) MCG/ACT IN AERS: 1.0000 | INHALATION_SPRAY | RESPIRATORY_TRACT | Status: DC | PRN

## 2014-04-02 NOTE — H&P (Signed)
Hospitalist Admission History and Physical  Patient name: April CordsSandra D Wong Medical record number: [id number] Date of birth: 03/11/1961 Age: 53 y.o. Gender: female  Primary Care Provider: Margaretann LovelessKHAN,NEELAM S, MD  Chief Complaint: back pain, hip pain   History of Present Illness:This is a 53 y.o. year old female with significant past medical history of morbid obesity, degenerative disc disease, hypothyroidism, fibromyalgia,  presenting with back pain. Reports several weeks of severe back pain with radiation down R leg. Was [facility]. Had CT of lumbar spine that showed Small globular mass present in the right side of L4-L5 at the central canal and right lateral recess, suspicious for recurrent disc extrusion. Unchanged severe degenerative disease at L3-L4 with mild central stenosis and mild symmetric foraminal stenosis. Normal R hip films. Case was discussed with pt's neurosurgeon [name](Kirtzner) who recommended disc fusion. Plan was for [facility]. Pt noted to have [facility] prior to [facility]. Pt states that pain was intractable at home and thus, came back to the ER. Pt denies any fever, chills, bowel/bladder incontinence.  Presents to the ER afebrile, HR 90s-120s, resp 10s, BP 110s-130s, satting >93% on RA. CBC and BMET WNL apart from K 3.0. EDP [name](Wentz) discussed case with oncall NS who recommends inpt admission for pain control. They will consult in am. Pt admits to anxiety about procedure. Pt also admits this is the reason that she left the hospital AMA yesterday.    Assessment and Plan: April Soto is a 53 y.o. year old female presenting with Back Pain    Active Problems:   Back pain   1- Back Pain  -baseline L4-L5 disc protusion in setting of degenerative disc disease -IV dilaudid  -defer decadron as pt reports ? Rash/anxiety with [facility]- no rash today  -f/u NS recs in am   2- HTN -BP stable  -cont home regimen   3- Asthma  -no resp distress -cont  home albuterol   4- Fibromyalgia -likely contributing to pain to some to degree -cont lyrica  -follow   5- Anxiety  -+ anxiety about potential procedure  -cont prozac   FEN/GI: heart healthy diet. Replete K. Mag level  Prophylaxis: sub q heparin  Disposition: pending further evaluaion  Code Status: Full Dose    Patient Active Problem List   Diagnosis Date Noted  . Back pain 04/02/2014  . Other specified examination 07/08/2013  . MRSA colonization 07/05/2013  . Rash and nonspecific skin eruption 07/05/2013  . Screening for STD (sexually transmitted disease) 07/05/2013   Past Medical History: Past Medical History  Diagnosis Date  . Hyperlipidemia     takes Simvastatin daily  . Degenerative disc disease   . Osteoarthritis   . Bipolar 1 disorder   . History of rheumatic fever   . Hypertension     takes Coreg daily  . Muscle spasm     takes Flexeril daily as needed  . Seasonal allergies     takes Zyrtec daily and uses Dymista as well  . Constipation     takes Ra [employer] daily  . GERD (gastroesophageal reflux disease)     takes Nexium daily  . Hypothyroidism     takes Synthroid daily  . Anxiety     takes Ativan daily  . Fibromyalgia     takes Lyrica daily  . Peripheral edema     takes HCTZ daily  . Pneumonia     hx of;last time 30+yrs ago  .  History of bronchitis     last time 15+yrs ago  . Headache(784.0)   . Stroke   . Weakness     nmbness and tingling-right leg  . Joint pain   . Chronic back pain     HNP  . Nocturia   . Urinary incontinence   . Diabetes mellitus without complication     borderline  . Depression     was on Prozac but taken off by MD for a couple of weeks    Past Surgical History: Past Surgical History  Procedure Laterality Date  . Tonsillectomy    . Septoplasty    . Tubal ligation    . Abdominal hysterectomy    . Cholecystectomy    . Other surgical history      Gastric stapling for weight loss. patient reports done in  1980's  . Lumbar laminectomy/decompression microdiscectomy Right 01/14/2013    Procedure: RIGHT LUMBAR FOUR FIVE  LAMINECTOMY/DECOMPRESSION MICRODISCECTOMY 1 LEVEL;  Surgeon: Reinaldo Meekerandy O Kritzer, MD;  Location: [facility];  Service: Neurosurgery;  Laterality: Right;  RIGHT L45 microdiskectomy  . Colonoscopy    . Esophagogastroduodenoscopy      Social History: History   Social History  . Marital Status: Divorced    Spouse Name: N/A    Number of Children: N/A  . Years of Education: N/A   Social History Main Topics  . Smoking status: Current Every Day Smoker -- 1.00 packs/day for 35 years    Types: Cigarettes  . Smokeless tobacco: Never Used  . Alcohol Use: No  . Drug Use: No  . Sexual Activity: None   Other Topics Concern  . None   Social History Narrative    Family History: History reviewed. No pertinent family history.  Allergies: Allergies  Allergen Reactions  . Amoxicillin Itching and Other (See Comments)    depression  . Celebrex [Celecoxib] Hives and Itching    Current Facility-Administered Medications  Medication Dose Route Frequency Provider Last Rate Last Dose  . 0.9 %  sodium chloride infusion   Intravenous Continuous Flint MelterElliott L Wentz, MD   Stopped at 04/02/14 1823  . 0.9 % NaCl with KCl 20 mEq/ L  infusion   Intravenous Continuous Doree AlbeeSteven Noel Rodier, MD      . acyclovir (ZOVIRAX) tablet 400 mg  400 mg Oral BID Doree AlbeeSteven Lorilyn Laitinen, MD      . albuterol (PROVENTIL HFA;VENTOLIN HFA) 108 (90 BASE) MCG/ACT inhaler 1 puff  1 puff Inhalation Q4H PRN Doree AlbeeSteven Luanna Weesner, MD      . amLODipine (NORVASC) tablet 10 mg  10 mg Oral Daily Doree AlbeeSteven Philicia Heyne, MD      . Azelastine-Fluticasone 137-50 MCG/ACT SUSP 1 spray  1 spray Nasal Daily Doree AlbeeSteven Syliva Mee, MD      . FLUoxetine (PROZAC) tablet 20 mg  20 mg Oral Daily Doree AlbeeSteven Heba Ige, MD      . heparin injection 5,000 Units  5,000 Units Subcutaneous 3 times per day Doree AlbeeSteven Doyle Tegethoff, MD      . HYDROmorphone (DILAUDID) injection 1-2 mg  1-2 mg Intravenous Q3H  PRN Doree AlbeeSteven Vander Kueker, MD      . Melene Muller[START ON 04/03/2014] levothyroxine (SYNTHROID, LEVOTHROID) tablet 50 mcg  50 mcg Oral QAC breakfast Doree AlbeeSteven Rocklyn Mayberry, MD      . Melene Muller[START ON 04/03/2014] pantoprazole (PROTONIX) EC tablet 80 mg  80 mg Oral Q1200 Doree AlbeeSteven Ezequiel Macauley, MD      . pregabalin (LYRICA) capsule 150 mg  150 mg Oral TID Doree AlbeeSteven Kamori Kitchens, MD  Current Outpatient Prescriptions  Medication Sig Dispense Refill  . acyclovir (ZOVIRAX) 400 MG tablet Take 400 mg by mouth 2 (two) times daily.    Marland Kitchen albuterol (PROAIR HFA) 108 (90 BASE) MCG/ACT inhaler Inhale into the lungs.    Marland Kitchen amLODipine (NORVASC) 10 MG tablet Take 10 mg by mouth.    . Azelastine-Fluticasone (DYMISTA) 137-50 MCG/ACT SUSP Place 1 spray into the nose daily.    Marland Kitchen azithromycin (ZITHROMAX) 250 MG tablet     . carvedilol (COREG) 12.5 MG tablet Take 12.5 mg by mouth 2 (two) times daily with a meal.    . cetirizine (ZYRTEC) 10 MG tablet Take 10 mg by mouth daily.    . CRESTOR 10 MG tablet     . CRESTOR 20 MG tablet   0  . cyclobenzaprine (FLEXERIL) 10 MG tablet Take 1 tablet (10 mg total) by mouth 3 (three) times daily as needed for muscle spasms. 50 tablet 2  . diclofenac (VOLTAREN) 75 MG EC tablet Take 75 mg by mouth 2 (two) times daily.    Marland Kitchen docusate sodium (RA COL-RITE) 100 MG capsule Take 200 mg by mouth daily.    Marland Kitchen doxycycline (VIBRA-TABS) 100 MG tablet   0  . esomeprazole (NEXIUM) 40 MG capsule Take 40 mg by mouth daily before breakfast.    . FLUoxetine (PROZAC) 20 MG tablet Take 20 mg by mouth daily.     . fluticasone (FLONASE) 50 MCG/ACT nasal spray     . hydrochlorothiazide (HYDRODIURIL) 25 MG tablet Take 25 mg by mouth daily.    Marland Kitchen levothyroxine (SYNTHROID, LEVOTHROID) 50 MCG tablet Take 50 mcg by mouth daily before breakfast.    . levothyroxine (SYNTHROID, LEVOTHROID) 88 MCG tablet   0  . LORazepam (ATIVAN) 1 MG tablet Take 1 mg by mouth 3 (three) times daily.    Marland Kitchen oxyCODONE-acetaminophen (PERCOCET/ROXICET) 5-325 MG per tablet Take 1  tablet by mouth 3 (three) times daily.    . pregabalin (LYRICA) 150 MG capsule Take 150 mg by mouth 3 (three) times daily.     Review Of Systems: 12 point ROS negative except as noted above in HPI.  Physical Exam: Filed Vitals:   04/02/14 2128  BP: 128/80  Pulse: 101  Temp:   Resp: 20    General: alert, cooperative and morbidly obese HEENT: PERRLA and extra ocular movement intact Heart: S1, S2 normal, no murmur, rub or gallop, regular rate and rhythm Lungs: clear to auscultation, no wheezes or rales and unlabored breathing Abdomen: obese abdomen, non tender, + bowel sounds  Extremities: 2+ peripheral pulses, neurovasculalry intact distally, mild R hip pain w/ internal/external rotation.  Skin:no rashes Neurology: normal without focal findings  Labs and Imaging: Lab Results  Component Value Date/Time   NA 140 04/02/2014 05:49 PM   K 3.0* 04/02/2014 05:49 PM   CL 101 04/02/2014 05:49 PM   CO2 26 04/02/2014 05:49 PM   BUN 11 04/02/2014 05:49 PM   CREATININE 0.56 04/02/2014 05:49 PM   GLUCOSE 119* 04/02/2014 05:49 PM   Lab Results  Component Value Date   WBC 7.0 04/02/2014   HGB 13.9 04/02/2014   HCT 42.5 04/02/2014   MCV 90.8 04/02/2014   PLT 161 04/02/2014    No results found.         Doree Albee MD  Pager: [phone]

## 2014-04-02 NOTE — ED Provider Notes (Signed)
CSN: [id number]     Arrival date & time 04/02/14  1556 History   First MD Initiated Contact with Patient 04/02/14 1643     Chief Complaint  Patient presents with  . Back Pain     (Consider location/radiation/quality/duration/timing/severity/associated sxs/prior Treatment) HPI   April Soto is a 53 y.o. female who is here for evaluation of "right hip pain.  She complains of pain in the right hip and right groin that is severe and debilitating.  She was in the hospital yesterday and due to be transferred to [place], for evaluation by neurosurgery.  She chose to leave the hospital AGAINST MEDICAL ADVICE.  She had been treated with prednisone, to help the discomfort, but since leaving the hospital, has not had any prednisone.  Her pain is severe and worsening today, and she decided to come here by private vehicle for evaluation and treatment.  She denies bowel or urinary incontinence.  She recently had surgery on the right foot for a painful condition.  She's not had any fever, chills, nausea, vomiting, weakness or dizziness.  The oxycodone, is not helping.  She is taking her other medications as prescribed.  There are no other known modifying factors.   Past Medical History  Diagnosis Date  . Hyperlipidemia     takes Simvastatin daily  . Degenerative disc disease   . Osteoarthritis   . Bipolar 1 disorder   . History of rheumatic fever   . Hypertension     takes Coreg daily  . Muscle spasm     takes Flexeril daily as needed  . Seasonal allergies     takes Zyrtec daily and uses Dymista as well  . Constipation     takes Ra [employer] daily  . GERD (gastroesophageal reflux disease)     takes Nexium daily  . Hypothyroidism     takes Synthroid daily  . Anxiety     takes Ativan daily  . Fibromyalgia     takes Lyrica daily  . Peripheral edema     takes HCTZ daily  . Pneumonia     hx of;last time 30+yrs ago  . History of bronchitis     last time 15+yrs ago  . Headache(784.0)    . Stroke   . Weakness     nmbness and tingling-right leg  . Joint pain   . Chronic back pain     HNP  . Nocturia   . Urinary incontinence   . Diabetes mellitus without complication     borderline  . Depression     was on Prozac but taken off by MD for a couple of weeks   Past Surgical History  Procedure Laterality Date  . Tonsillectomy    . Septoplasty    . Tubal ligation    . Abdominal hysterectomy    . Cholecystectomy    . Other surgical history      Gastric stapling for weight loss. patient reports done in 1980's  . Lumbar laminectomy/decompression microdiscectomy Right 01/14/2013    Procedure: RIGHT LUMBAR FOUR FIVE  LAMINECTOMY/DECOMPRESSION MICRODISCECTOMY 1 LEVEL;  Surgeon: Reinaldo Meekerandy O Kritzer, MD;  Location: [facility];  Service: Neurosurgery;  Laterality: Right;  RIGHT L45 microdiskectomy  . Colonoscopy    . Esophagogastroduodenoscopy     History reviewed. No pertinent family history. History  Substance Use Topics  . Smoking status: Current Every Day Smoker -- 1.00 packs/day for 35 years    Types: Cigarettes  . Smokeless tobacco: Never Used  .  Alcohol Use: No   OB History    No data available     Review of Systems  All other systems reviewed and are negative.     Allergies  Amoxicillin and Celebrex  Home Medications   Prior to Admission medications   Medication Sig Start Date End Date Taking? Authorizing Provider  acyclovir (ZOVIRAX) 400 MG tablet Take 400 mg by mouth 2 (two) times daily.    Historical Provider, MD  albuterol (PROAIR HFA) 108 (90 BASE) MCG/ACT inhaler Inhale into the lungs.    Historical Provider, MD  amLODipine (NORVASC) 10 MG tablet Take 10 mg by mouth.    Historical Provider, MD  Azelastine-Fluticasone (DYMISTA) 137-50 MCG/ACT SUSP Place 1 spray into the nose daily.    Historical Provider, MD  azithromycin (ZITHROMAX) 250 MG tablet  07/01/13   Historical Provider, MD  carvedilol (COREG) 12.5 MG tablet Take 12.5 mg by mouth 2 (two)  times daily with a meal.    Historical Provider, MD  cetirizine (ZYRTEC) 10 MG tablet Take 10 mg by mouth daily.    Historical Provider, MD  CRESTOR 10 MG tablet  07/01/13   Historical Provider, MD  CRESTOR 20 MG tablet  03/05/14   Historical Provider, MD  cyclobenzaprine (FLEXERIL) 10 MG tablet Take 1 tablet (10 mg total) by mouth 3 (three) times daily as needed for muscle spasms. 01/15/13   Clydene Fake, MD  diclofenac (VOLTAREN) 75 MG EC tablet Take 75 mg by mouth 2 (two) times daily.    Historical Provider, MD  docusate sodium (RA COL-RITE) 100 MG capsule Take 200 mg by mouth daily.    Historical Provider, MD  doxycycline (VIBRA-TABS) 100 MG tablet  03/15/14   Historical Provider, MD  esomeprazole (NEXIUM) 40 MG capsule Take 40 mg by mouth daily before breakfast.    Historical Provider, MD  FLUoxetine (PROZAC) 20 MG tablet Take 20 mg by mouth daily.     Historical Provider, MD  fluticasone Aleda Grana) 50 MCG/ACT nasal spray  07/01/13   Historical Provider, MD  hydrochlorothiazide (HYDRODIURIL) 25 MG tablet Take 25 mg by mouth daily.    Historical Provider, MD  levothyroxine (SYNTHROID, LEVOTHROID) 50 MCG tablet Take 50 mcg by mouth daily before breakfast.    Historical Provider, MD  levothyroxine (SYNTHROID, LEVOTHROID) 88 MCG tablet  03/05/14   Historical Provider, MD  LORazepam (ATIVAN) 1 MG tablet Take 1 mg by mouth 3 (three) times daily.    Historical Provider, MD  oxyCODONE-acetaminophen (PERCOCET/ROXICET) 5-325 MG per tablet Take 1 tablet by mouth 3 (three) times daily.    Historical Provider, MD  pregabalin (LYRICA) 150 MG capsule Take 150 mg by mouth 3 (three) times daily.    Historical Provider, MD   BP 130/79 mmHg  Pulse 96  Temp(Src) 98 F (36.7 C) (Oral)  Resp 15  SpO2 100% Physical Exam  Constitutional: She is oriented to person, place, and time. She appears well-developed and well-nourished. She appears distressed (she is tearful, crying.).  HENT:  Head: Normocephalic and  atraumatic.  Right Ear: External ear normal.  Left Ear: External ear normal.  Eyes: Conjunctivae and EOM are normal. Pupils are equal, round, and reactive to light.  Neck: Normal range of motion and phonation normal. Neck supple.  Cardiovascular: Normal rate, regular rhythm and normal heart sounds.   Pulmonary/Chest: Effort normal and breath sounds normal. No respiratory distress. She exhibits no bony tenderness.  Abdominal: Soft. She exhibits no distension. There is no tenderness.  Musculoskeletal: Normal  range of motion.  She is holding the right hip and knee flexed, and resists extension secondary to pain.  Neurological: She is alert and oriented to person, place, and time. No cranial nerve deficit or sensory deficit. She exhibits normal muscle tone. Coordination normal.  Skin: Skin is warm, dry and intact.  Psychiatric: She has a normal mood and affect. Her behavior is normal. Judgment and thought content normal.  Nursing note and vitals reviewed.   ED Course  Procedures (including critical care time) Medications  0.9 %  sodium chloride infusion ( Intravenous Stopped 04/02/14 1823)  HYDROmorphone (DILAUDID) injection 1 mg (1 mg Intravenous Given 04/02/14 1720)  ondansetron (ZOFRAN) injection 4 mg (4 mg Intravenous Given 04/02/14 1720)  methylPREDNISolone sodium succinate (SOLU-MEDROL) 125 mg/2 mL injection 125 mg (125 mg Intravenous Given 04/02/14 1736)  HYDROmorphone (DILAUDID) injection 1 mg (1 mg Intravenous Given 04/02/14 1748)  HYDROmorphone (DILAUDID) injection 1 mg (1 mg Intravenous Given 04/02/14 1915)    Patient Vitals for the past 24 hrs:  BP Temp Temp src Pulse Resp SpO2  04/02/14 1936 130/79 mmHg - - 96 15 100 %  04/02/14 1915 117/67 mmHg - - 103 22 95 %  04/02/14 1900 125/77 mmHg - - 97 17 93 %  04/02/14 1800 121/80 mmHg - - 97 12 94 %  04/02/14 1603 138/83 mmHg 98 F (36.7 C) Oral (!) 128 16 98 %    8:39 PM Reevaluation with update and discussion. After initial  assessment and treatment, an updated evaluation reveals she is more comfortable now. Dickie Labarre L    8:24 PM-Consult complete with S. Newton. Patient case explained and discussed. He agrees to admit patient for further evaluation and treatment. Call ended at 20:30   Labs Review Labs Reviewed  BASIC METABOLIC PANEL - Abnormal; Notable for the following:    Potassium 3.0 (*)    Glucose, Bld 119 (*)    All other components within normal limits  CBC WITH DIFFERENTIAL    Imaging Review No results found.   EKG Interpretation None      MDM   Final diagnoses:  Right-sided low back pain with right-sided sciatica  Degenerative disc disease, lumbar    Ongoing lumbar pain, with radiculopathy, and progressive degenerative disc disease.  No cauda equina syndrome.  Pain is controlled, when she is not moving.  She will require admission for further evaluation, by her attending neurosurgeon, for consideration of therapeutic surgery.   Nursing Notes Reviewed/ Care Coordinated, and agree without changes. Applicable Imaging Reviewed.  Interpretation of Laboratory Data incorporated into ED treatment  Plan: Admit for pain control    Flint MelterElliott L Jyoti Harju, MD 04/02/14 2054

## 2014-04-02 NOTE — ED Notes (Signed)
Phlebotomy at the bedside  

## 2014-04-02 NOTE — Progress Notes (Signed)
Attempted to receive report.  Nurse to call back.

## 2014-04-02 NOTE — ED Notes (Signed)
Dr. Wentz at the bedside.  

## 2014-04-02 NOTE — Progress Notes (Signed)
April CordsSandra D Cloer [id number] Admitted to [id number]: 04/02/2014 11:13 PM Attending Provider: Doree AlbeeSteven Newton, MD    April CordsSandra D Soto is a 53 y.o. female patient admitted from ED awake, alert  & orientated  X 3,  Full Code, VSS - Blood pressure 135/79, pulse 110, temperature 98.3 F (36.8 C), temperature source Oral, resp. rate 13, height 5\' 6"  (1.676 m), weight 108.2 kg (238 lb 8.6 oz), SpO2 92 %.RA, no c/o shortness of breath, no c/o chest pain, no distress noted.    IV site WDL:  with a transparent dsg that's clean dry and intact.  Allergies:   Allergies  Allergen Reactions  . Amoxicillin Itching and Other (See Comments)    depression  . Celebrex [Celecoxib] Hives and Itching     Past Medical History  Diagnosis Date  . Hyperlipidemia     takes Simvastatin daily  . Degenerative disc disease   . Osteoarthritis   . Bipolar 1 disorder   . History of rheumatic fever   . Hypertension     takes Coreg daily  . Muscle spasm     takes Flexeril daily as needed  . Seasonal allergies     takes Zyrtec daily and uses Dymista as well  . Constipation     takes Ra [employer] daily  . GERD (gastroesophageal reflux disease)     takes Nexium daily  . Hypothyroidism     takes Synthroid daily  . Anxiety     takes Ativan daily  . Fibromyalgia     takes Lyrica daily  . Peripheral edema     takes HCTZ daily  . Pneumonia     hx of;last time 30+yrs ago  . History of bronchitis     last time 15+yrs ago  . Headache(784.0)   . Stroke   . Weakness     nmbness and tingling-right leg  . Joint pain   . Chronic back pain     HNP  . Nocturia   . Urinary incontinence   . Depression     was on Prozac but taken off by MD for a couple of weeks    History:  obtained from patient  Pt orientation to unit, room and routine. Information packet given to patient and safety video refused.  Admission INP armband ID verified with patient, and in place. SR up x 2, fall risk assessment complete with Patient  verbalizing understanding of risks associated with falls. Pt verbalizes an understanding of how to use the call bell and to call for help before getting out of bed.  Skin, clean-dry- intact without evidence of bruising, or skin tears.   No evidence of skin break down noted on exam.  Will cont to monitor and assist as needed.  Elisha PonderFaucette, Joselyn Edling Nicole, RN 04/02/2014 11:13 PM

## 2014-04-02 NOTE — ED Notes (Signed)
Attempted to call report to floor.  Nurse unavailable but will call back.

## 2014-04-02 NOTE — ED Notes (Signed)
Phlebotomy called for draw on pt for blood specimens.

## 2014-04-02 NOTE — ED Notes (Signed)
Pt crying at triage, reports 6 weeks of severe back pain that radiates down right leg. Was [facility] and had MRI done. Pt was told she has a mass on her spine that would require surgery, they wanted to transfer her here but pt had refused and left ama. Reports not relief with percocet.

## 2014-04-03 DIAGNOSIS — M5430 Sciatica, unspecified side: Secondary | ICD-10-CM

## 2014-04-03 DIAGNOSIS — J45909 Unspecified asthma, uncomplicated: Secondary | ICD-10-CM | POA: Diagnosis present

## 2014-04-03 DIAGNOSIS — M5441 Lumbago with sciatica, right side: Secondary | ICD-10-CM

## 2014-04-03 DIAGNOSIS — F411 Generalized anxiety disorder: Secondary | ICD-10-CM

## 2014-04-03 LAB — COMPREHENSIVE METABOLIC PANEL
ALBUMIN: 3.2 g/dL — AB (ref 3.5–5.2)
ALK PHOS: 69 U/L (ref 39–117)
ALT: 16 U/L (ref 0–35)
AST: 15 U/L (ref 0–37)
Anion gap: 11 (ref 5–15)
BUN: 12 mg/dL (ref 6–23)
CALCIUM: 9 mg/dL (ref 8.4–10.5)
CO2: 27 mEq/L (ref 19–32)
Chloride: 104 mEq/L (ref 96–112)
Creatinine, Ser: 0.47 mg/dL — ABNORMAL LOW (ref 0.50–1.10)
GFR calc Af Amer: >90 mL/min (ref >90)
GFR calc non Af Amer: >90 mL/min (ref >90)
Glucose, Bld: 160 mg/dL — ABNORMAL HIGH (ref 70–99)
POTASSIUM: 4 meq/L (ref 3.7–5.3)
SODIUM: 142 meq/L (ref 137–147)
TOTAL PROTEIN: 6.3 g/dL (ref 6.0–8.3)
Total Bilirubin: 0.2 mg/dL — ABNORMAL LOW (ref 0.3–1.2)

## 2014-04-03 LAB — CBC WITH DIFFERENTIAL/PLATELET
BASOS PCT: 0 % (ref 0–1)
Basophils Absolute: 0 10*3/uL (ref 0.0–0.1)
Eosinophils Absolute: 0 10*3/uL (ref 0.0–0.7)
Eosinophils Relative: 0 % (ref 0–5)
HEMATOCRIT: 40.8 % (ref 36.0–46.0)
HEMOGLOBIN: 13.3 g/dL (ref 12.0–15.0)
LYMPHS ABS: 0.9 10*3/uL (ref 0.7–4.0)
Lymphocytes Relative: 18 % (ref 12–46)
MCH: 29.5 pg (ref 26.0–34.0)
MCHC: 32.6 g/dL (ref 30.0–36.0)
MCV: 90.5 fL (ref 78.0–100.0)
MONO ABS: 0.1 10*3/uL (ref 0.1–1.0)
Monocytes Relative: 2 % — ABNORMAL LOW (ref 3–12)
Neutro Abs: 4.2 10*3/uL (ref 1.7–7.7)
Neutrophils Relative %: 80 % — ABNORMAL HIGH (ref 43–77)
Platelets: 196 10*3/uL (ref 150–400)
RBC: 4.51 MIL/uL (ref 3.87–5.11)
RDW: 14.8 % (ref 11.5–15.5)
WBC: 5.3 10*3/uL (ref 4.0–10.5)

## 2014-04-03 LAB — MAGNESIUM: Magnesium: 1.9 mg/dL (ref 1.5–2.5)

## 2014-04-03 LAB — TSH: TSH: 1.47 u[IU]/mL (ref 0.350–4.500)

## 2014-04-03 LAB — MRSA PCR SCREENING: MRSA by PCR: NEGATIVE

## 2014-04-03 MED ORDER — LORAZEPAM 0.5 MG PO TABS
0.5000 mg | ORAL_TABLET | Freq: Once | ORAL | Status: AC
  Administered 2014-04-03: 0.5 mg via ORAL
  Filled 2014-04-03: qty 1

## 2014-04-03 MED ORDER — ZOLPIDEM TARTRATE 5 MG PO TABS
5.0000 mg | ORAL_TABLET | Freq: Once | ORAL | Status: AC
  Administered 2014-04-03: 5 mg via ORAL
  Filled 2014-04-03: qty 1

## 2014-04-03 MED ORDER — OXYCODONE HCL 5 MG PO TABS
10.0000 mg | ORAL_TABLET | ORAL | Status: DC | PRN
  Administered 2014-04-03 – 2014-04-04 (×4): 10 mg via ORAL
  Filled 2014-04-03 (×4): qty 2

## 2014-04-03 NOTE — Evaluation (Signed)
Physical Therapy Evaluation Patient Details Name: April Soto MRN: [id number] DOB: 04/09/1961 Today's Date: 04/03/2014   History of Present Illness    Pt admit with intractable back pain with severe degenerative disease at L3-L4 with mild central stenosis and mild symmetric foraminal stenosis. MD trying to get pain controlled and NS to consult.  Clinical Impression  Pt admitted with above diagnosis. Pt currently with functional limitations due to the deficits listed below (see PT Problem List). Pt will need f/u at Outpt for PT if she goes home at current level.   Has all equipment and prn care.  Will follow while in hospital to ensure that pt is mobilizing.  Ecucated pt to back precautions and gave back handout for pt to help her decr pain with movement.  Pt will benefit from skilled PT to increase their independence and safety with mobility to allow discharge to the venue listed below.      Follow Up Recommendations Outpatient PT;Supervision - Intermittent    Equipment Recommendations  None recommended by PT    Recommendations for Other Services       Precautions / Restrictions Precautions Precautions: Fall;Back Restrictions Weight Bearing Restrictions: No      Mobility  Bed Mobility Overal bed mobility: Needs Assistance;+2 for physical assistance Bed Mobility: Sidelying to Sit;Sit to Supine   Sidelying to sit: Mod assist;+2 for physical assistance   Sit to supine: Min assist   General bed mobility comments: Pt educated on log roll to decr pain with bed mobility.  Pt was able to get OOB via log roll but could not get back into bed that way due to buttock pull on right.   Transfers Overall transfer level: Needs assistance Equipment used: Rolling walker (2 wheeled) Transfers: Sit to/from Stand Sit to Stand: +2 physical assistance;Min assist         General transfer comment: Pt needed cues for hand placement.   Ambulation/Gait Ambulation/Gait assistance: Min  assist Ambulation Distance (Feet): 45 Feet Assistive device: Rolling walker (2 wheeled) Gait Pattern/deviations: Step-through pattern;Decreased stride length;Decreased step length - right;Decreased weight shift to right;Antalgic;Wide base of support   Gait velocity interpretation: Below normal speed for age/gender General Gait Details: Pt able to ambulate to door and back. Pt reports her pain is under control right now but when its not, that the walking is more difficult.  Has good safety with RW.   Stairs            Wheelchair Mobility    Modified Rankin (Stroke Patients Only)       Balance Overall balance assessment: Needs assistance Sitting-balance support: No upper extremity supported;Feet supported Sitting balance-Leahy Scale: Good     Standing balance support: Bilateral upper extremity supported;During functional activity Standing balance-Leahy Scale: Fair Standing balance comment: can stand statically for up to a minute without assistive device.               High level balance activites: Turns;Direction changes High Level Balance Comments: Has difficulty maneuvering around obstacles and with turns.             Pertinent Vitals/Pain Pain Assessment: 0-10 Pain Score: 6  Pain Location: back and right buttock and extends behind knee to ankle Pain Descriptors / Indicators: Aching;Sharp;Stabbing Pain Intervention(s): Limited activity within patient's tolerance;Monitored during session;Premedicated before session;Repositioned  VSS    Home Living Family/patient expects to be discharged to:: Private residence Living Arrangements: Other relatives Available Help at Discharge: Family;Available 24 hours/day (lives with sister but 67may  move in with son) Type of Home: Mobile home Home Access: Stairs to enter Entrance Stairs-Rails: Can reach both;Left;Right Entrance Stairs-Number of Steps: 5 Home Layout: One level Home Equipment: Walker - 2 wheels;Cane - single  point;Cane - quad;Bedside commode;Tub bench;Wheelchair - manual Additional Comments: Lived with sister PTA but plans on going to live with son when she leaves hospital.      Prior Function Level of Independence: Needs assistance   Gait / Transfers Assistance Needed: Over the last month, pt needed help with ambulation with RW with a person providing steadying assist.    ADL's / Homemaking Assistance Needed: Bathing and dressing needed set up and pt sponge bathing.  If shower had a helper.    Comments: Pt does not clean or cook.  Pt does not drive     Hand Dominance   Dominant Hand: Right    Extremity/Trunk Assessment   Upper Extremity Assessment: Defer to OT evaluation           Lower Extremity Assessment: RLE deficits/detail;LLE deficits/detail RLE Deficits / Details: grossly 3-/5 LLE Deficits / Details: grossly 3/5  Cervical / Trunk Assessment: Normal  Communication   Communication: No difficulties  Cognition Arousal/Alertness: Awake/alert Behavior During Therapy: WFL for tasks assessed/performed Overall Cognitive Status: Within Functional Limits for tasks assessed                      General Comments      Exercises General Exercises - Lower Extremity Ankle Circles/Pumps: AROM;Both;10 reps;Seated Long Arc Quad: AROM;Both;5 reps;Seated      Assessment/Plan    PT Assessment Patient needs continued PT services  PT Diagnosis Generalized weakness;Acute pain   PT Problem List Decreased activity tolerance;Decreased balance;Decreased mobility;Decreased knowledge of use of DME;Decreased safety awareness;Decreased knowledge of precautions;Pain  PT Treatment Interventions DME instruction;Gait training;Functional mobility training;Therapeutic activities;Therapeutic exercise;Balance training;Patient/family education   PT Goals (Current goals can be found in the Care Plan section) Acute Rehab PT Goals Patient Stated Goal: to gohome PT Goal Formulation: With  patient Time For Goal Achievement: 04/10/14 Potential to Achieve Goals: Good    Frequency Min 3X/week   Barriers to discharge        Co-evaluation               End of Session Equipment Utilized During Treatment: Gait belt Activity Tolerance: Patient limited by pain;Patient limited by fatigue Patient left: in bed;with call bell/phone within reach;with bed alarm set;with family/visitor present Nurse Communication: Mobility status;Precautions         Time: [id number] PT Time Calculation (min) (ACUTE ONLY): 34 min   Charges:   PT Evaluation $Initial PT Evaluation Tier I: 1 Procedure PT Treatments $Gait Training: 8-22 mins $Self Care/Home Management: 8-22   PT G CodesTawni Millers:          Gusta Marksberry F 04/03/2014, 2:00 PM Hasson Gaspard,PT Acute Rehabilitation [phone] (pager)

## 2014-04-03 NOTE — Progress Notes (Signed)
PROGRESS NOTE  April Soto ZOX:[id number] DOB: 03/19/1961 DOA: 04/02/2014 PCP: Margaretann LovelessKHAN,NEELAM S, MD  HPI: 53 y.o. year old female with significant past medical history of morbid obesity, degenerative disc disease, hypothyroidism, fibromyalgia, presenting with back pain. Reports several weeks of severe back pain with radiation down R leg. Was [facility] at [facility]. Had CT of lumbar spine that showed Small globular mass present in the right side of L4-L5 at the central canal and right lateral recess, suspicious for recurrent disc extrusion. Unchanged severe degenerative disease at L3-L4 with mild central stenosis and mild symmetric foraminal stenosis. Normal R hip films. Case was discussed with pt's neurosurgeon April Bales(Kirtzner) who recommended disc fusion. [facility]. [facility]. Pt states that pain was intractable at home and thus, came back to the ER. Pt denies any fever, chills, bowel/bladder incontinence. Presents to the ER afebrile, HR 90s-120s, resp 10s, BP 110s-130s, satting >93% on RA. CBC and BMET WNL apart from K 3.0. EDP April Shy(Wentz) discussed case with oncall NS who recommends inpt admission for pain control. They will consult in am. Pt admits to anxiety about procedure. Pt also admits this is the reason that she left the hospital AMA yesterday.   Subjective / 24 H Interval events Crying due to back pain  Assessment/Plan: Active Problems:   Back pain  Back Pain  - baseline L4-L5 disc protusion in setting of degenerative disc disease, [facility] with L4-5 right paracentral disc protrusion with mass effect on the right intraspinal nerve roots.  - IV dilaudid  - discussed with Dr. Gerlene Soto, will see in consultation - PT  HTN - BP stable  - cont home regimen   Asthma  - no resp distress - cont home albuterol   Fibromyalgia - likely contributing to pain to some to degree - cont lyrica  - follow   Anxiety  -+  anxiety about potential procedure  - cont prozac    Diet: Diet Heart Fluids: none  DVT Prophylaxis: heparin  Code Status: Full Code Family Communication: d/w patient  Disposition Plan: remain inpatient  Consultants:  Neurosurgery   Procedures:  None    Antibiotics  Anti-infectives    Start     Dose/Rate Route Frequency Ordered Stop   04/02/14 2200  acyclovir (ZOVIRAX) tablet 400 mg     400 mg Oral 2 times daily 04/02/14 2119        Studies No results found.  Objective  Filed Vitals:   04/02/14 2128 04/02/14 2200 04/02/14 2251 04/03/14 0531  BP: 128/80 127/76 135/79 132/83  Pulse: 101 110    Temp:   98.3 F (36.8 C) 97.8 F (36.6 C)  TempSrc:   Oral Oral  Resp: 20 32 13 13  Height:   5\' 6"  (1.676 m)   Weight:   108.2 kg (238 lb 8.6 oz)   SpO2: 99% 96% 92% 92%    Intake/Output Summary (Last 24 hours) at 04/03/14 [id number] Last data filed at 04/02/14 2335  Gross per 24 hour  Intake    120 ml  Output     50 ml  Net     70 ml   Filed Weights   04/02/14 2251  Weight: 108.2 kg (238 lb 8.6 oz)    Exam:  General:  NAD but later crying  Cardiovascular: RRR  Respiratory: CTA biL  Abdomen: soft, non tender  MSK: no edema  Neuro: non focal  Data Reviewed: Basic Metabolic Panel:  Recent Labs Lab 04/02/14 1749 04/03/14 0045  NA 140  --   K 3.0*  --   CL 101  --   CO2 26  --   GLUCOSE 119*  --   BUN 11  --   CREATININE 0.56  --   CALCIUM 8.7  --   MG  --  1.9   CBC:  Recent Labs Lab 04/02/14 1749  WBC 7.0  NEUTROABS 3.2  HGB 13.9  HCT 42.5  MCV 90.8  PLT 161   CBG:  Recent Labs Lab 04/02/14 2307  GLUCAP 163*    Recent Results (from the past 240 hour(s))  MRSA PCR Screening     Status: None   Collection Time: 04/02/14 11:34 PM  Result Value Ref Range Status   MRSA by PCR NEGATIVE NEGATIVE Final    Comment:        The GeneXpert MRSA Assay (FDA approved for NASAL specimens only), is one component of a comprehensive MRSA  colonization surveillance program. It is not intended to diagnose MRSA infection nor to guide or monitor treatment for MRSA infections.      Scheduled Meds: . acyclovir  400 mg Oral BID  . amLODipine  10 mg Oral Daily  . antiseptic oral rinse  7 mL Mouth Rinse BID  . FLUoxetine  40 mg Oral QHS  . fluticasone  1 spray Each Nare Daily  . heparin  5,000 Units Subcutaneous 3 times per day  . levothyroxine  88 mcg Oral QAC breakfast  . pantoprazole  80 mg Oral Q1200  . pregabalin  150 mg Oral TID   Continuous Infusions: . sodium chloride Stopped (04/02/14 1823)  . 0.9 % NaCl with KCl 20 mEq / L 100 mL/hr at 04/03/14 [id number]    Time spent: 35 minutes  April Pertostin [name], MD [facility] Pager [phone]. If 7 PM - 7 AM, please contact night-coverage at [email or web address], password [email or web address] 04/03/2014, 7:29 AM  LOS: 1 day

## 2014-04-04 DIAGNOSIS — M549 Dorsalgia, unspecified: Secondary | ICD-10-CM

## 2014-04-04 LAB — COMPREHENSIVE METABOLIC PANEL
ALK PHOS: 53 U/L (ref 39–117)
ALT: 17 U/L (ref 0–35)
ANION GAP: 7 (ref 5–15)
AST: 18 U/L (ref 0–37)
Albumin: 3 g/dL — ABNORMAL LOW (ref 3.5–5.2)
BILIRUBIN TOTAL: 0.3 mg/dL (ref 0.3–1.2)
BUN: 13 mg/dL (ref 6–23)
CHLORIDE: 106 meq/L (ref 96–112)
CO2: 28 mmol/L (ref 19–32)
CREATININE: 0.7 mg/dL (ref 0.50–1.10)
Calcium: 8.2 mg/dL — ABNORMAL LOW (ref 8.4–10.5)
GLUCOSE: 92 mg/dL (ref 70–99)
POTASSIUM: 3.2 mmol/L — AB (ref 3.5–5.1)
Sodium: 141 mmol/L (ref 135–145)
Total Protein: 5.1 g/dL — ABNORMAL LOW (ref 6.0–8.3)

## 2014-04-04 LAB — CBC WITH DIFFERENTIAL/PLATELET
BASOS PCT: 0 % (ref 0–1)
Basophils Absolute: 0 10*3/uL (ref 0.0–0.1)
Eosinophils Absolute: 0 10*3/uL (ref 0.0–0.7)
Eosinophils Relative: 0 % (ref 0–5)
HEMATOCRIT: 38.8 % (ref 36.0–46.0)
Hemoglobin: 12.5 g/dL (ref 12.0–15.0)
Lymphocytes Relative: 37 % (ref 12–46)
Lymphs Abs: 3 10*3/uL (ref 0.7–4.0)
MCH: 29.6 pg (ref 26.0–34.0)
MCHC: 32.2 g/dL (ref 30.0–36.0)
MCV: 91.9 fL (ref 78.0–100.0)
MONO ABS: 1.1 10*3/uL — AB (ref 0.1–1.0)
Monocytes Relative: 13 % — ABNORMAL HIGH (ref 3–12)
NEUTROS ABS: 4 10*3/uL (ref 1.7–7.7)
NEUTROS PCT: 50 % (ref 43–77)
PLATELETS: 165 10*3/uL (ref 150–400)
RBC: 4.22 MIL/uL (ref 3.87–5.11)
RDW: 15.3 % (ref 11.5–15.5)
WBC: 8.1 10*3/uL (ref 4.0–10.5)

## 2014-04-04 MED ORDER — HYDROMORPHONE HCL 2 MG PO TABS
4.0000 mg | ORAL_TABLET | ORAL | Status: DC | PRN
  Administered 2014-04-04 – 2014-04-05 (×3): 4 mg via ORAL
  Filled 2014-04-04 (×3): qty 2

## 2014-04-04 MED ORDER — POTASSIUM CHLORIDE CRYS ER 20 MEQ PO TBCR
30.0000 meq | EXTENDED_RELEASE_TABLET | Freq: Once | ORAL | Status: AC
  Administered 2014-04-04: 30 meq via ORAL
  Filled 2014-04-04: qty 1

## 2014-04-04 MED ORDER — HYDROMORPHONE HCL 1 MG/ML IJ SOLN: 1.0000 mg | Freq: Once | INTRAMUSCULAR | Status: AC

## 2014-04-04 MED ORDER — HYDROMORPHONE HCL 2 MG PO TABS
2.0000 mg | ORAL_TABLET | ORAL | Status: DC | PRN
  Administered 2014-04-04: 2 mg via ORAL
  Filled 2014-04-04: qty 1

## 2014-04-04 NOTE — Progress Notes (Signed)
PROGRESS NOTE  Kathrine CordsSandra D Swiatek ZOX:[id number] DOB: 07/19/1960 DOA: 04/02/2014 PCP: Margaretann LovelessKHAN,NEELAM S, MD  HPI: 53 y.o. year old female with significant past medical history of morbid obesity, degenerative disc disease, hypothyroidism, fibromyalgia, presenting with back pain. Reports several weeks of severe back pain with radiation down R leg. [facility]. Had CT of lumbar spine that showed Small globular mass present in the right side of L4-L5 at the central canal and right lateral recess, suspicious for recurrent disc extrusion. Unchanged severe degenerative disease at L3-L4 with mild central stenosis and mild symmetric foraminal stenosis. Normal R hip films. Case was discussed with pt's neurosurgeon [name](Kirtzner) who recommended disc fusion. [facility]. Pt noted to have [facility] prior to transfer. Pt states that pain was intractable at home and thus, came back to the ER. Pt denies any fever, chills, bowel/bladder incontinence. Presents to the ER afebrile, HR 90s-120s, resp 10s, BP 110s-130s, satting >93% on RA. CBC and BMET WNL apart from K 3.0. EDP [name]([facility]entz) discussed case with oncall NS who recommends inpt admission for pain control. They will consult in am. Pt admits to anxiety about procedure. Pt also admits this is the reason that she left the hospital AMA yesterday.   Subjective / 24 H Interval events - Patient feeling better this morning and upbeat about her upcoming surgery, however after working with PT she is in extreme distress and crying in bed due to severe back pain  Assessment/Plan: Active Problems:   Back pain   Asthma, chronic  Back Pain  - baseline L4-L5 disc protusion in setting of degenerative disc disease, [facility] with L4-5 right paracentral disc protrusion with mass effect on the right intraspinal nerve roots.  - IV dilaudid  - discussed with Dr. [name], he plans to do surgery next week. Meanwhile she can be discharged  home/SNF - PT worked with the patient yesterday, she did really well, and they recommended outpatient physical therapy. Today however when she worked with the physical therapy, she experienced worsening of her pain, and by early afternoon she was back in bed crying in excruciating pain. I don't feel like she is ready to go home today, I discussed extensively with the patient that I think she may need skilled nursing facility from now until her surgery next week. She promised me she will think about it.  HTN - BP stable  - cont home regimen   Asthma  - no resp distress - cont home albuterol   Fibromyalgia - likely contributing to pain to some to degree - cont lyrica  - follow   Anxiety  -+ anxiety about potential procedure  - cont prozac    Diet: Diet Heart Fluids: none  DVT Prophylaxis: heparin  Code Status: Full Code Family Communication: d/w patient  Disposition Plan: remain inpatient, home/SNF when her pain is better  Consultants:  Neurosurgery   Procedures:  None    Antibiotics  Anti-infectives    Start     Dose/Rate Route Frequency Ordered Stop   04/02/14 2200  acyclovir (ZOVIRAX) tablet 400 mg     400 mg Oral 2 times daily 04/02/14 2119        Studies No results found.  Objective  Filed Vitals:   04/03/14 0531 04/03/14 2201 04/04/14 0545 04/04/14 1350  BP: 132/83 130/74 113/73 124/63  Pulse:   73 83  Temp: 97.8 F (36.6 C) 98.3 F (36.8 C) 98 F (36.7 C)  98 F (36.7 C)  TempSrc: Oral Oral Oral Oral  Resp: 13 18 16 24   Height:      Weight:      SpO2: 92% 97% 99% 99%    Intake/Output Summary (Last 24 hours) at 04/04/14 1537 Last data filed at 04/04/14 1528  Gross per 24 hour  Intake   1550 ml  Output   1750 ml  Net   -200 ml   Filed Weights   04/02/14 2251  Weight: 108.2 kg (238 lb 8.6 oz)    Exam:  General:  NAD but later crying  Cardiovascular: RRR  Respiratory: CTA biL  Abdomen: soft, non tender  MSK: no  edema  Neuro: non focal  Data Reviewed: Basic Metabolic Panel:  Recent Labs Lab 04/02/14 1749 04/03/14 0045 04/03/14 0622 04/04/14 0643  NA 140  --  142 141  K 3.0*  --  4.0 3.2*  CL 101  --  104 106  CO2 26  --  27 28  GLUCOSE 119*  --  160* 92  BUN 11  --  12 13  CREATININE 0.56  --  0.47* 0.70  CALCIUM 8.7  --  9.0 8.2*  MG  --  1.9  --   --    CBC:  Recent Labs Lab 04/02/14 1749 04/03/14 0622 04/04/14 0643  WBC 7.0 5.3 8.1  NEUTROABS 3.2 4.2 4.0  HGB 13.9 13.3 12.5  HCT 42.5 40.8 38.8  MCV 90.8 90.5 91.9  PLT 161 196 165   CBG:  Recent Labs Lab 04/02/14 2307  GLUCAP 163*    Recent Results (from the past 240 hour(s))  MRSA PCR Screening     Status: None   Collection Time: 04/02/14 11:34 PM  Result Value Ref Range Status   MRSA by PCR NEGATIVE NEGATIVE Final    Comment:        The GeneXpert MRSA Assay (FDA approved for NASAL specimens only), is one component of a comprehensive MRSA colonization surveillance program. It is not intended to diagnose MRSA infection nor to guide or monitor treatment for MRSA infections.      Scheduled Meds: . acyclovir  400 mg Oral BID  . amLODipine  10 mg Oral Daily  . antiseptic oral rinse  7 mL Mouth Rinse BID  . FLUoxetine  40 mg Oral QHS  . fluticasone  1 spray Each Nare Daily  . heparin  5,000 Units Subcutaneous 3 times per day  . levothyroxine  88 mcg Oral QAC breakfast  . pantoprazole  80 mg Oral Q1200  . pregabalin  150 mg Oral TID   Continuous Infusions: . sodium chloride 125 mL/hr at 04/04/14 1401    Time spent: 25 minutes  [name] Macguire Holsinger, MD [facility] Pager [phone]. If 7 PM - 7 AM, please contact night-coverage at [email or web address], password [email or web address] 04/04/2014, 3:37 PM  LOS: 2 days

## 2014-04-04 NOTE — Progress Notes (Signed)
Physical Therapy Treatment Patient Details Name: April Soto MRN: [id number] DOB: 03/01/1961 Today's Date: 04/04/2014    History of Present Illness Patient is Soto 53 y/o female admitted with intractable back pain with severe degenerative disease at L3-L4 with mild central stenosis and mild symmetric foraminal stenosis. MD trying to get pain controlled and NS to consult.  Plan for surgery after the holidays.    PT Comments    Patient not progressing with mobility today due to high pain level. Pt with increased pain with all mobility resulting in poor activity tolerance. Increased time to perform all transfers due to pain. Pt given dilaudid prior to treatment however this did not seem to ease pain. Pt crying entire session. Discharge recommendations updated due to lack of progress and tolerance for activity. Discussed need to adjust pain medication with MD. Discharge recommendation changed to ST SNF as pt not able to get out of bed without assist or ambulate today. CM/SW notified of recommendation. Not safe to return home. Will continue to follow acutely.   Follow Up Recommendations  SNF;Supervision/Assistance - 24 hour     Equipment Recommendations  None recommended by PT    Recommendations for Other Services       Precautions / Restrictions Precautions Precautions: Fall;Back Precaution Booklet Issued: No Precaution Comments: reviewed log roll technique for pain control Restrictions Weight Bearing Restrictions: No    Mobility  Bed Mobility Overal bed mobility: Needs Assistance Bed Mobility: Rolling;Sidelying to Sit;Sit to Sidelying Rolling: Supervision Sidelying to sit: HOB elevated;Mod assist   Sit to supine: Mod assist;HOB elevated   General bed mobility comments: Increased time to perform bed mobiility and transfer (~15 mins to get to EOB). Cues for log roll. Attempted with HOB flat, no rails however pt unable. HOB elevated 30 degrees, use of rails, increased time and  effort, Mod Soto to elevate trunk. Rolling x6 towards left. Mod Soto to bring BLEs into bed and for repositioning.  Transfers Overall transfer level: Needs assistance Equipment used: Rolling walker (2 wheeled) Transfers: Sit to/from Stand Sit to Stand: Min assist         General transfer comment: Min Soto to stand for balance. Not able to obtain full upright posture due to pain. Able to clear BLEs with assist for weightshifting but crying entire time resulting in need to return to supine.  Ambulation/Gait                 Stairs            Wheelchair Mobility    Modified Rankin (Stroke Patients Only)       Balance Overall balance assessment: Needs assistance Sitting-balance support: Feet supported;Bilateral upper extremity supported Sitting balance-Leahy Scale: Poor Sitting balance - Comments: BUE support posteriorly as position of comfort due to increased pain with sitting upright. Postural control: Posterior lean Standing balance support: During functional activity;Bilateral upper extremity supported Standing balance-Leahy Scale: Poor Standing balance comment: Requires BUE support on RW for  balance/safety with increased WB through UEs.                    Cognition Arousal/Alertness: Awake/alert Behavior During Therapy: WFL for tasks assessed/performed Overall Cognitive Status: Within Functional Limits for tasks assessed       Memory: Decreased recall of precautions              Exercises      General Comments General comments (skin integrity, edema, etc.): Lengthy discussion with patient about disposition. Pt  reports not being able to return home due to uncontrolled pain. Discussed option of ST SNF prior to surgery next week. Spoke with MD and CM about plans/progress.      Pertinent Vitals/Pain Pain Assessment: 0-10 Pain Score: 9  (Pain Soto 6 prior to start of PT session after being given dilaudid.) Pain Location: back and right hip s/p  mobility. Pain Descriptors / Indicators: Sore;Sharp;Aching Pain Intervention(s): Limited activity within patient's tolerance;Monitored during session;Premedicated before session;Repositioned;Patient requesting pain meds-RN notified    Home Living                      Prior Function            PT Goals (current goals can now be found in the care plan section) Progress towards PT goals: Not progressing toward goals - comment    Frequency  Min 3X/week    PT Plan Discharge plan needs to be updated    Co-evaluation             End of Session Equipment Utilized During Treatment: Gait belt Activity Tolerance: Patient limited by pain Patient left: in bed;with call bell/phone within reach;with bed alarm set     Time: [id number] PT Time Calculation (min) (ACUTE ONLY): 25 min  Charges:  $Therapeutic Activity: 23-37 mins                    G CodesAlvie Soto:      April Soto 04/04/2014, 3:24 PM April Soto, PT, DPT [phone]

## 2014-04-04 NOTE — Progress Notes (Signed)
Patient ID: April CordsSandra D Erbe, female   DOB: 10/26/1960, 53 y.o.   MRN: [id number] Patient well known to me. She had a lumbar discectomy in the past, and developed recurrent left leg pain. 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 Options were discussed, and a lumbar fusion was recommended if she wished to undergo surgical intervention. She said she was afraid to undergo a fusion, and disappeared from our care in March of this year. Her pain increased last week, and rather than call our office, [facility] and was [facility] for pain control. She had an MRI scan , and [facility], and neurosurgical consult was requested. Since yesterday, she has been up walking some with physical therapy.   On her exam, she splints a bit due to pain, but has good strength of her left lower extremity including good dorsi flexor strength.  Her MRI is reviewed and shows the same recurrent disc rupture that she had 9 months ago. If she wants surgery, then she would still need an interbody fusion at L 45 with screw fixation. She says that she is ready to proceed. i have checked with the operating room, and with my schedule, and surgery this holiday week  ( christmas) will be quite difficult with no advance warning. I have talked to her about going home soon, and returning in a week or so for her surgery. She seems open to this plan. She should be sent home with 4 mg dilaudid pills which should be strong enough to keep her comfortable over the holiday weekend until we can do her surgery next week.  Have her call my scheduler Erie NoeVanessa as soon as she gets home.

## 2014-04-04 NOTE — Care Management Note (Signed)
    Page 1 of 2   04/05/2014     10:58:47 AM CARE MANAGEMENT NOTE 04/05/2014  Patient:  April Soto,April Soto   Account Number:  [id number]  Date Initiated:  04/04/2014  Documentation initiated by:  Letha CapeAYLOR,Nicolis Boody  Subjective/Objective Assessment:   dx back hip pain  admit- lives with sister, but may move in with son per pt note.     Action/Plan:   pt rec snf- pt refusing snf   Anticipated DC Date:  04/05/2014   Anticipated DC Plan:  SKILLED NURSING FACILITY  In-house referral  Clinical Social Worker      DC Associate Professorlanning Services  CM consult      [facility]  [facility]   Choice offered to / List presented to:  C-1 Patient   DME arranged  HOSPITAL BED      DME agency  [facility]     [facility]  HH-[facility] RN  HH-2 PT  HH-3 OT  HH-4 NURSE'S AIDE  HH-6 SOCIAL WORKER      HH agency  [facility]   Status of service:  Completed, signed off Medicare Important Message given?  YES (If response is "NO", the following Medicare IM given date fields will be blank) Date Medicare IM given:  04/05/2014 Medicare IM given by:  Letha CapeAYLOR,Onis Markoff Date Additional Medicare IM given:   Additional Medicare IM given by:    Discharge Disposition:  HOME W HOME HEALTH SERVICES  Per UR Regulation:  Reviewed for med. necessity/level of care/duration of stay  If discussed at Long Length of Stay Meetings, dates discussed:    Comments:  04/05/14 1056 Letha Capeeborah Hassell Patras RN, BSN [phone] patient for dc today home with hh services, she is refusing snf at this time.  [facility], Ot, aide and Social Work.  [facility] , Miranda notified. Soc will begin 24-48 hrs post dc.  Patient states she would like to have a hospital bed delivered to her home as well.  04/04/14 1620 Letha Capeeborah Tomeeka Plaugher RN, BSN [phone] patient lives with sister, per physical therapy recs snf. CSW referral.

## 2014-04-05 ENCOUNTER — Other Ambulatory Visit: Payer: Self-pay | Admitting: Neurosurgery

## 2014-04-05 DIAGNOSIS — M5137 Other intervertebral disc degeneration, lumbosacral region: Secondary | ICD-10-CM

## 2014-04-05 DIAGNOSIS — E039 Hypothyroidism, unspecified: Secondary | ICD-10-CM | POA: Diagnosis present

## 2014-04-05 DIAGNOSIS — M5441 Lumbago with sciatica, right side: Secondary | ICD-10-CM | POA: Diagnosis present

## 2014-04-05 DIAGNOSIS — M51369 Other intervertebral disc degeneration, lumbar region without mention of lumbar back pain or lower extremity pain: Secondary | ICD-10-CM | POA: Diagnosis present

## 2014-04-05 DIAGNOSIS — M5136 Other intervertebral disc degeneration, lumbar region: Secondary | ICD-10-CM | POA: Diagnosis present

## 2014-04-05 DIAGNOSIS — G8929 Other chronic pain: Secondary | ICD-10-CM | POA: Diagnosis present

## 2014-04-05 DIAGNOSIS — E876 Hypokalemia: Secondary | ICD-10-CM | POA: Diagnosis present

## 2014-04-05 LAB — COMPREHENSIVE METABOLIC PANEL
ALT: 24 U/L (ref 0–35)
AST: 30 U/L (ref 0–37)
Albumin: 3.1 g/dL — ABNORMAL LOW (ref 3.5–5.2)
Alkaline Phosphatase: 57 U/L (ref 39–117)
Anion gap: 9 (ref 5–15)
BUN: 10 mg/dL (ref 6–23)
CHLORIDE: 100 meq/L (ref 96–112)
CO2: 26 mmol/L (ref 19–32)
CREATININE: 0.61 mg/dL (ref 0.50–1.10)
Calcium: 8.3 mg/dL — ABNORMAL LOW (ref 8.4–10.5)
GFR calc Af Amer: >90 mL/min (ref >90)
GFR calc non Af Amer: >90 mL/min (ref >90)
Glucose, Bld: 112 mg/dL — ABNORMAL HIGH (ref 70–99)
Potassium: 3.6 mmol/L (ref 3.5–5.1)
SODIUM: 135 mmol/L (ref 135–145)
Total Bilirubin: 0.3 mg/dL (ref 0.3–1.2)
Total Protein: 5.4 g/dL — ABNORMAL LOW (ref 6.0–8.3)

## 2014-04-05 LAB — CBC WITH DIFFERENTIAL/PLATELET
Basophils Absolute: 0 K/uL (ref 0.0–0.1)
Basophils Relative: 0 % (ref 0–1)
Eosinophils Absolute: 0.1 K/uL (ref 0.0–0.7)
Eosinophils Relative: 1 % (ref 0–5)
HCT: 40.2 % (ref 36.0–46.0)
Hemoglobin: 12.9 g/dL (ref 12.0–15.0)
Lymphocytes Relative: 42 % (ref 12–46)
Lymphs Abs: 3.4 K/uL (ref 0.7–4.0)
MCH: 29.6 pg (ref 26.0–34.0)
MCHC: 32.1 g/dL (ref 30.0–36.0)
MCV: 92.2 fL (ref 78.0–100.0)
Monocytes Absolute: 0.9 K/uL (ref 0.1–1.0)
Monocytes Relative: 11 % (ref 3–12)
Neutro Abs: 3.8 K/uL (ref 1.7–7.7)
Neutrophils Relative %: 46 % (ref 43–77)
Platelets: 160 K/uL (ref 150–400)
RBC: 4.36 MIL/uL (ref 3.87–5.11)
RDW: 15.2 % (ref 11.5–15.5)
WBC: 8.1 K/uL (ref 4.0–10.5)

## 2014-04-05 MED ORDER — HYDROMORPHONE HCL 1 MG/ML IJ SOLN
2.0000 mg | Freq: Once | INTRAMUSCULAR | Status: AC
Start: 2014-04-05 — End: 2014-04-05
  Administered 2014-04-05: 2 mg via INTRAVENOUS

## 2014-04-05 MED ORDER — LORAZEPAM 2 MG/ML IJ SOLN
1.0000 mg | Freq: Once | INTRAMUSCULAR | Status: AC
  Administered 2014-04-05: 1 mg via INTRAVENOUS
  Filled 2014-04-05: qty 1

## 2014-04-05 MED ORDER — SENNA 8.6 MG PO TABS: 1.0000 | ORAL_TABLET | Freq: Every day | ORAL | Status: DC | PRN

## 2014-04-05 MED ORDER — LIDOCAINE 5 % EX PTCH: 2.0000 | MEDICATED_PATCH | CUTANEOUS | Status: DC

## 2014-04-05 MED ORDER — OXYCODONE HCL ER 10 MG PO T12A: 10.0000 mg | EXTENDED_RELEASE_TABLET | Freq: Two times a day (BID) | ORAL | Status: DC

## 2014-04-05 MED ORDER — HYDROMORPHONE HCL 4 MG PO TABS: 4.0000 mg | ORAL_TABLET | ORAL | Status: DC | PRN

## 2014-04-05 NOTE — Progress Notes (Signed)
Physical Therapy Treatment Patient Details Name: April CordsSandra D Standen MRN: [id number] DOB: 09/22/1960 Today's Date: 04/05/2014    History of Present Illness Patient is a 53 y/o female admitted with intractable back pain with severe degenerative disease at L3-L4 with mild central stenosis and mild symmetric foraminal stenosis. MD trying to get pain controlled and NS to consult.  Plan for surgery after the holidays.    PT Comments    Pt very limited in mobility due to pain. Pt at high risk for falls, pressure sores and PNA. Educated on importance of mobility and pressure relief upon D/C home. Pt unable to mobilize today due to pain. Pt and son refusing SNF at this time.   Follow Up Recommendations  Supervision/Assistance - 24 hour;Home health PT;Other (comment) (pt refusing SNF )     Equipment Recommendations  Hospital bed    Recommendations for Other Services       Precautions / Restrictions Precautions Precautions: Fall;Back Precaution Comments: reviewed back precautions for pain management  Restrictions Weight Bearing Restrictions: No    Mobility  Bed Mobility Overal bed mobility: Needs Assistance Bed Mobility: Rolling;Sidelying to Sit;Sit to Sidelying Rolling: Supervision Sidelying to sit: Max assist;HOB elevated     Sit to sidelying: Supervision;HOB elevated General bed mobility comments: educated on log rolling technique; pt able to come up to halfway sitting position but unable to acheive full upright position this session due to pain   Transfers                    Ambulation/Gait             General Gait Details: refusing transfers and OOB due to pain   Stairs            Wheelchair Mobility    Modified Rankin (Stroke Patients Only)       Balance Overall balance assessment: Needs assistance                                  Cognition Arousal/Alertness: Awake/alert Behavior During Therapy: WFL for tasks  assessed/performed Overall Cognitive Status: Within Functional Limits for tasks assessed       Memory: Decreased recall of precautions              Exercises      General Comments General comments (skin integrity, edema, etc.): reviewed home setup safety; importance of OOB and mobilization due to incr risk of pressure sores and PNA; reviewed pressure relief techniques       Pertinent Vitals/Pain Pain Assessment: 0-10 Pain Score: 10-Worst pain ever Pain Location: back and Rt hip Pain Descriptors / Indicators: Grimacing;Crying;Sharp;Shooting Pain Intervention(s): Monitored during session;Premedicated before session;Repositioned    Home Living                      Prior Function            PT Goals (current goals can now be found in the care plan section) Acute Rehab PT Goals Patient Stated Goal: to go home with IV pain meds PT Goal Formulation: With patient Time For Goal Achievement: 04/10/14 Potential to Achieve Goals: Fair Progress towards PT goals: Not progressing toward goals - comment (due to pain)    Frequency  Min 3X/week    PT Plan Discharge plan needs to be updated    Co-evaluation  End of Session   Activity Tolerance: Patient limited by pain Patient left: in bed;with call bell/phone within reach;with family/visitor present     Time: [id number] PT Time Calculation (min) (ACUTE ONLY): 20 min  Charges:  $Therapeutic Activity: 8-22 mins                    G CodesDonell Sievert:      Clare Casto N, [place] CarolinaPT  [phone] 04/05/2014, 1:03 PM

## 2014-04-05 NOTE — Progress Notes (Signed)
DC home with son, verbally understood DC instructions, no questions asked. 

## 2014-04-05 NOTE — Discharge Summary (Signed)
Physician Discharge Summary  Kathrine CordsSandra D Mcaulay UJW:[id number] DOB: 04/11/1961 DOA: 04/02/2014  PCP: Margaretann LovelessKHAN,NEELAM S, MD  Admit date: 04/02/2014 Discharge date: 04/05/2014  Time spent: 35  minutes  Recommendations for Outpatient Follow-up:  1. [facility] and outpt follow up for surgery with Dr [name] in 1 week. She and instructed to call his office today to schedule appointment.  Discharge Diagnoses:  Principle problem:   Right-sided low back pain with right-sided sciatica  Active Problems:   Back pain   Degenerative disc disease, lumbar   Asthma, chronic   Hypothyroidism   Hypokalemia  Fibromyalgia     Discharge Condition: fair  Diet recommendation: regular  Filed Weights   04/02/14 2251  Weight: 108.2 kg (238 lb 8.6 oz)    History of present illness:  Reason for to admission H&P for details, but in brief,53 y.o. year old female with significant past medical history of morbid obesity, degenerative disc disease, hypothyroidism, fibromyalgia, presenting with back pain. Reports several weeks of severe back pain with radiation down the right leg.. [facility] Had CT of lumbar spine that showed Small globular mass present in the right side of L4-L5 at the central canal and right lateral recess, suspicious for recurrent disc extrusion. Unchanged severe degenerative disease at L3-L4 with mild central stenosis and mild symmetric foraminal stenosis. Normal R hip films. Case was discussed with pt's neurosurgeon [name]([place]) who recommended disc fusion. [facility]. Pt states that pain was intractable at home and thus, came back to the ER. Pt denied any fever, chills, bowel/bladder incontinence. He should vitals on admission were stable except for mild tachycardia. Labs normal except for hypokalemia.  Patient admitted to hospitalist service for pain control and neurosurgery consultation.  Hospital Course:   Severe low back pain Patient has baseline L4-L5 disc protrusion setting of degenerative disc disease with L4-L5 right paracentral disc protrusion with mass effect on the right intraspinal nerve roots on imaging. -Patient placed on IV Dilaudid. Seen by Dr. [name] who recommends pain control for now and discharge her home with outpatient follow-up with him in one week to schedule for surgery. -Patient has been able to participate with physical therapy who recommended skilled nursing facility but patient declined. She still has low back pain which is controlled with Dilaudid 4 mg every 4 hours. I will add Lidoderm patch and long-acting OxyContin 10 mg twice daily scheduled. She can resume her home dose of Flexeril for muscle spasms. We'll add bowel regimen as well. -We'll arrange for home health PT, RN and home health aide and social work.  Hypertension Blood pressure stable. Continue home medications  Asthma Stable. Continue home inhalers  Fibromyalgia Continue Lyrica. Likely aggravating her pain is well  Anxiety Continue Prozac  Hypothyroidism Continue Synthroid.   Procedures:  None  Consultations:  Dr. [name] (neurosurgery)  Discharge Exam: Filed Vitals:   04/05/14 0528  BP: 123/71  Pulse: 79  Temp: 98.3 F (36.8 C)  Resp: 16    General: Middle aged female in distress with pain HEENT: Moist oral mucosa Chest: Clear to auscultation bilaterally, no added sounds CVS: Normal S1 and S2, no murmurs Abdomen: Soft, nondistended, nontender Extremities: Tenderness over right lower back, good strength in lower extremities CNS: Alert and oriented   Discharge Instructions    Current Discharge Medication List    START taking these medications   Details  HYDROmorphone (DILAUDID) 4 MG  tablet Take 1 tablet (4 mg total) by mouth every 4 (four) hours as needed for severe pain. Qty: 50 tablet, Refills: 0    lidocaine (LIDODERM) 5 % Place 2 patches onto the skin daily.  Remove & Discard patch within 12 hours or as directed by MD Qty: 20 patch, Refills: 0    OxyCODONE (OXYCONTIN) 10 mg T12A 12 hr tablet Take 1 tablet (10 mg total) by mouth every 12 (twelve) hours. Qty: 20 tablet, Refills: 0    senna (SENOKOT) 8.6 MG TABS tablet Take 1 tablet (8.6 mg total) by mouth daily as needed for moderate constipation. Qty: 20 each, Refills: 0      CONTINUE these medications which have NOT CHANGED   Details  acyclovir (ZOVIRAX) 400 MG tablet Take 400 mg by mouth at bedtime.     albuterol (PROAIR HFA) 108 (90 BASE) MCG/ACT inhaler Inhale 2 puffs into the lungs every 4 (four) hours as needed for wheezing or shortness of breath.     amLODipine (NORVASC) 10 MG tablet Take 10 mg by mouth at bedtime.     cetirizine (ZYRTEC) 10 MG tablet Take 10 mg by mouth at bedtime as needed for allergies.     cyclobenzaprine (FLEXERIL) 10 MG tablet Take 1 tablet (10 mg total) by mouth 3 (three) times daily as needed for muscle spasms. Qty: 50 tablet, Refills: 2    esomeprazole (NEXIUM) 40 MG capsule Take 40 mg by mouth at bedtime as needed (acid reflux).     FLUoxetine (PROZAC) 20 MG tablet Take 40 mg by mouth at bedtime.     fluticasone (FLONASE) 50 MCG/ACT nasal spray Place 1 spray into both nostrils at bedtime.     hydrocortisone (ANUSOL-HC) 2.5 % rectal cream Place 1 application rectally at bedtime as needed for hemorrhoids or itching.    levothyroxine (SYNTHROID, LEVOTHROID) 88 MCG tablet Take 88 mcg by mouth daily before breakfast.  Refills: 0    LORazepam (ATIVAN) 1 MG tablet Take 1 mg by mouth 3 (three) times daily.    Olopatadine HCl (PATADAY) 0.2 % SOLN Place 1 drop into both eyes at bedtime as needed (allergies).    pregabalin (LYRICA) 150 MG capsule Take 150 mg by mouth 3 (three) times daily.    CRESTOR 20 MG tablet Take 20 mg by mouth daily. Refills: 0    hydrochlorothiazide (HYDRODIURIL) 25 MG tablet Take 25 mg by mouth daily. Refills: 0      STOP taking  these medications     diclofenac (VOLTAREN) 75 MG EC tablet      oxyCODONE-acetaminophen (PERCOCET/ROXICET) 5-325 MG per tablet      Azelastine-Fluticasone (DYMISTA) 137-50 MCG/ACT SUSP        Allergies  Allergen Reactions  . Amoxicillin Itching and Other (See Comments)    depression  . Celebrex [Celecoxib] Hives and Itching   Follow-up Information    Follow up with Reinaldo MeekerKRITZER,RANDY O, MD. Schedule an appointment as soon as possible for a visit in 1 week.   Specialty:  Neurosurgery   Contact information:   [address] [place] [address] [phone]        The results of significant diagnostics from this hospitalization (including imaging, microbiology, ancillary and laboratory) are listed below for reference.    Significant Diagnostic Studies: No results found.  Microbiology: Recent Results (from the past 240 hour(s))  MRSA PCR Screening     Status: None   Collection Time: 04/02/14 11:34 PM  Result Value Ref Range  Status   MRSA by PCR NEGATIVE NEGATIVE Final    Comment:        The GeneXpert MRSA Assay (FDA approved for NASAL specimens only), is one component of a comprehensive MRSA colonization surveillance program. It is not intended to diagnose MRSA infection nor to guide or monitor treatment for MRSA infections.      Labs: Basic Metabolic Panel:  Recent Labs Lab 04/02/14 1749 04/03/14 0045 04/03/14 0622 04/04/14 0643 04/05/14 0549  NA 140  --  142 141 135  K 3.0*  --  4.0 3.2* 3.6  CL 101  --  104 106 100  CO2 26  --  27 28 26   GLUCOSE 119*  --  160* 92 112*  BUN 11  --  12 13 10   CREATININE 0.56  --  0.47* 0.70 0.61  CALCIUM 8.7  --  9.0 8.2* 8.3*  MG  --  1.9  --   --   --    Liver Function Tests:  Recent Labs Lab 04/03/14 0622 04/04/14 0643 04/05/14 0549  AST 15 18 30   ALT 16 17 24   ALKPHOS 69 53 57  BILITOT 0.2* 0.3 0.3  PROT 6.3 5.1* 5.4*  ALBUMIN 3.2* 3.0* 3.1*   No results for input(s): LIPASE, AMYLASE  in the last 168 hours. No results for input(s): AMMONIA in the last 168 hours. CBC:  Recent Labs Lab 04/02/14 1749 04/03/14 0622 04/04/14 0643 04/05/14 0549  WBC 7.0 5.3 8.1 8.1  NEUTROABS 3.2 4.2 4.0 3.8  HGB 13.9 13.3 12.5 12.9  HCT 42.5 40.8 38.8 40.2  MCV 90.8 90.5 91.9 92.2  PLT 161 196 165 160   Cardiac Enzymes: No results for input(s): CKTOTAL, CKMB, CKMBINDEX, TROPONINI in the last 168 hours. BNP: BNP (last 3 results) No results for input(s): PROBNP in the last 8760 hours. CBG:  Recent Labs Lab 04/02/14 2307  GLUCAP 163*       Signed:  Elandra Powell  [facility] 04/05/2014, 11:56 AM

## 2014-04-11 ENCOUNTER — Encounter (HOSPITAL_COMMUNITY): Payer: Self-pay | Admitting: *Deleted

## 2014-04-12 MED ORDER — VANCOMYCIN HCL 10 G IV SOLR
1500.0000 mg | INTRAVENOUS | Status: AC
  Administered 2014-04-13: 1500 mg via INTRAVENOUS
  Filled 2014-04-12 (×2): qty 1500

## 2014-04-12 MED ORDER — DEXAMETHASONE SODIUM PHOSPHATE 10 MG/ML IJ SOLN
10.0000 mg | INTRAMUSCULAR | Status: AC
  Administered 2014-04-13: 10 mg via INTRAVENOUS
  Filled 2014-04-12: qty 1

## 2014-04-13 ENCOUNTER — Inpatient Hospital Stay (HOSPITAL_COMMUNITY): Payer: Medicare Other | Admitting: Certified Registered Nurse Anesthetist

## 2014-04-13 ENCOUNTER — Encounter (HOSPITAL_COMMUNITY): Payer: Self-pay | Admitting: Certified Registered Nurse Anesthetist

## 2014-04-13 ENCOUNTER — Inpatient Hospital Stay (HOSPITAL_COMMUNITY): Payer: Medicare Other

## 2014-04-13 ENCOUNTER — Other Ambulatory Visit: Payer: Self-pay

## 2014-04-13 ENCOUNTER — Inpatient Hospital Stay (HOSPITAL_COMMUNITY)
Admission: RE | Admit: 2014-04-13 | Discharge: 2014-04-17 | DRG: 460 | Disposition: A | Discharge status: Home / Self Care | Payer: Medicare Other | Source: Ambulatory Visit | Attending: Neurosurgery | Admitting: Neurosurgery

## 2014-04-13 ENCOUNTER — Encounter (HOSPITAL_COMMUNITY)
Admission: RE | Disposition: A | Discharge status: Home / Self Care | Payer: Self-pay | Source: Ambulatory Visit | Attending: Neurosurgery

## 2014-04-13 DIAGNOSIS — K219 Gastro-esophageal reflux disease without esophagitis: Secondary | ICD-10-CM | POA: Diagnosis present

## 2014-04-13 DIAGNOSIS — Z8673 Personal history of transient ischemic attack (TIA), and cerebral infarction without residual deficits: Secondary | ICD-10-CM

## 2014-04-13 DIAGNOSIS — Z981 Arthrodesis status: Secondary | ICD-10-CM | POA: Diagnosis not present

## 2014-04-13 DIAGNOSIS — M62838 Other muscle spasm: Secondary | ICD-10-CM | POA: Diagnosis present

## 2014-04-13 DIAGNOSIS — J302 Other seasonal allergic rhinitis: Secondary | ICD-10-CM | POA: Diagnosis present

## 2014-04-13 DIAGNOSIS — M5126 Other intervertebral disc displacement, lumbar region: Secondary | ICD-10-CM | POA: Diagnosis present

## 2014-04-13 DIAGNOSIS — M79604 Pain in right leg: Secondary | ICD-10-CM | POA: Diagnosis present

## 2014-04-13 DIAGNOSIS — I7300 Raynaud's syndrome without gangrene: Secondary | ICD-10-CM | POA: Diagnosis present

## 2014-04-13 DIAGNOSIS — I10 Essential (primary) hypertension: Secondary | ICD-10-CM | POA: Diagnosis present

## 2014-04-13 DIAGNOSIS — E039 Hypothyroidism, unspecified: Secondary | ICD-10-CM | POA: Diagnosis present

## 2014-04-13 DIAGNOSIS — F319 Bipolar disorder, unspecified: Secondary | ICD-10-CM | POA: Diagnosis present

## 2014-04-13 DIAGNOSIS — M549 Dorsalgia, unspecified: Secondary | ICD-10-CM | POA: Diagnosis present

## 2014-04-13 DIAGNOSIS — F17210 Nicotine dependence, cigarettes, uncomplicated: Secondary | ICD-10-CM | POA: Diagnosis present

## 2014-04-13 DIAGNOSIS — Z7951 Long term (current) use of inhaled steroids: Secondary | ICD-10-CM | POA: Diagnosis not present

## 2014-04-13 DIAGNOSIS — Z419 Encounter for procedure for purposes other than remedying health state, unspecified: Secondary | ICD-10-CM

## 2014-04-13 DIAGNOSIS — E785 Hyperlipidemia, unspecified: Secondary | ICD-10-CM | POA: Diagnosis present

## 2014-04-13 DIAGNOSIS — K5900 Constipation, unspecified: Secondary | ICD-10-CM | POA: Diagnosis present

## 2014-04-13 DIAGNOSIS — M797 Fibromyalgia: Secondary | ICD-10-CM | POA: Diagnosis present

## 2014-04-13 DIAGNOSIS — G8929 Other chronic pain: Secondary | ICD-10-CM | POA: Diagnosis present

## 2014-04-13 DIAGNOSIS — F419 Anxiety disorder, unspecified: Secondary | ICD-10-CM | POA: Diagnosis present

## 2014-04-13 HISTORY — DX: Anemia, unspecified: D64.9

## 2014-04-13 HISTORY — DX: Cardiac murmur, unspecified: R01.1

## 2014-04-13 HISTORY — DX: Raynaud's syndrome without gangrene: I73.00

## 2014-04-13 HISTORY — DX: Rheumatic fever without heart involvement: I00

## 2014-04-13 HISTORY — DX: Herpesviral infection of urogenital system, unspecified: A60.00

## 2014-04-13 SURGERY — POSTERIOR LUMBAR FUSION 1 LEVEL
Anesthesia: General | Site: Back

## 2014-04-13 MED ORDER — KETAMINE HCL 10 MG/ML IJ SOLN
INTRAMUSCULAR | Status: DC | PRN
  Administered 2014-04-13: 50 mg via INTRAVENOUS

## 2014-04-13 MED ORDER — ROCURONIUM BROMIDE 100 MG/10ML IV SOLN
INTRAVENOUS | Status: DC | PRN
  Administered 2014-04-13: 40 mg via INTRAVENOUS
  Administered 2014-04-13: 10 mg via INTRAVENOUS

## 2014-04-13 MED ORDER — FLUTICASONE PROPIONATE 50 MCG/ACT NA SUSP
1.0000 | Freq: Every day | NASAL | Status: DC
  Administered 2014-04-13 – 2014-04-14 (×2): 1 via NASAL
  Filled 2014-04-13: qty 16

## 2014-04-13 MED ORDER — PANTOPRAZOLE SODIUM 40 MG IV SOLR
40.0000 mg | Freq: Every day | INTRAVENOUS | Status: DC
  Administered 2014-04-13: 40 mg via INTRAVENOUS
  Filled 2014-04-13: qty 40

## 2014-04-13 MED ORDER — DEXAMETHASONE SODIUM PHOSPHATE 10 MG/ML IJ SOLN
INTRAMUSCULAR | Status: AC
  Filled 2014-04-13: qty 1

## 2014-04-13 MED ORDER — ACETAMINOPHEN 650 MG RE SUPP: 650.0000 mg | RECTAL | Status: DC | PRN

## 2014-04-13 MED ORDER — ONDANSETRON HCL 4 MG/2ML IJ SOLN
INTRAMUSCULAR | Status: DC | PRN
  Administered 2014-04-13: 4 mg via INTRAVENOUS

## 2014-04-13 MED ORDER — CYCLOBENZAPRINE HCL 10 MG PO TABS
10.0000 mg | ORAL_TABLET | Freq: Three times a day (TID) | ORAL | Status: DC | PRN
  Administered 2014-04-13 – 2014-04-17 (×8): 10 mg via ORAL
  Filled 2014-04-13 (×10): qty 1

## 2014-04-13 MED ORDER — PROPOFOL 10 MG/ML IV BOLUS
INTRAVENOUS | Status: AC
  Filled 2014-04-13: qty 20

## 2014-04-13 MED ORDER — ACETAMINOPHEN 325 MG PO TABS
650.0000 mg | ORAL_TABLET | ORAL | Status: DC | PRN
  Administered 2014-04-17: 650 mg via ORAL
  Filled 2014-04-13: qty 2

## 2014-04-13 MED ORDER — VANCOMYCIN HCL IN DEXTROSE 1-5 GM/200ML-% IV SOLN
1000.0000 mg | Freq: Once | INTRAVENOUS | Status: AC
Start: 2014-04-13 — End: 2014-04-13
  Administered 2014-04-13: 1000 mg via INTRAVENOUS
  Filled 2014-04-13: qty 200

## 2014-04-13 MED ORDER — PROPOFOL 10 MG/ML IV BOLUS
INTRAVENOUS | Status: DC | PRN
  Administered 2014-04-13: 160 mg via INTRAVENOUS
  Administered 2014-04-13: 10 mg via INTRAVENOUS

## 2014-04-13 MED ORDER — LORAZEPAM 1 MG PO TABS
1.0000 mg | ORAL_TABLET | Freq: Three times a day (TID) | ORAL | Status: DC
  Administered 2014-04-13 – 2014-04-17 (×12): 1 mg via ORAL
  Filled 2014-04-13 (×12): qty 1

## 2014-04-13 MED ORDER — ARTIFICIAL TEARS OP OINT
TOPICAL_OINTMENT | OPHTHALMIC | Status: AC
  Filled 2014-04-13: qty 3.5

## 2014-04-13 MED ORDER — PREGABALIN 75 MG PO CAPS
150.0000 mg | ORAL_CAPSULE | Freq: Three times a day (TID) | ORAL | Status: DC
  Administered 2014-04-13 – 2014-04-17 (×12): 150 mg via ORAL
  Filled 2014-04-13 (×12): qty 2

## 2014-04-13 MED ORDER — DEXAMETHASONE SODIUM PHOSPHATE 4 MG/ML IJ SOLN
4.0000 mg | Freq: Four times a day (QID) | INTRAMUSCULAR | Status: AC
  Administered 2014-04-13: 4 mg via INTRAVENOUS
  Filled 2014-04-13: qty 1

## 2014-04-13 MED ORDER — MIDAZOLAM HCL 2 MG/2ML IJ SOLN
INTRAMUSCULAR | Status: AC
  Filled 2014-04-13: qty 2

## 2014-04-13 MED ORDER — GLYCOPYRROLATE 0.2 MG/ML IJ SOLN
INTRAMUSCULAR | Status: DC | PRN
  Administered 2014-04-13: 0.4 mg via INTRAVENOUS

## 2014-04-13 MED ORDER — LACTATED RINGERS IV SOLN
INTRAVENOUS | Status: DC | PRN
  Administered 2014-04-13 (×3): via INTRAVENOUS

## 2014-04-13 MED ORDER — SENNA 8.6 MG PO TABS: 1.0000 | ORAL_TABLET | Freq: Every day | ORAL | Status: DC | PRN

## 2014-04-13 MED ORDER — FENTANYL CITRATE 0.05 MG/ML IJ SOLN
INTRAMUSCULAR | Status: DC | PRN
  Administered 2014-04-13 (×6): 50 ug via INTRAVENOUS
  Administered 2014-04-13: 100 ug via INTRAVENOUS
  Administered 2014-04-13 (×2): 50 ug via INTRAVENOUS

## 2014-04-13 MED ORDER — BACITRACIN ZINC 500 UNIT/GM EX OINT
TOPICAL_OINTMENT | CUTANEOUS | Status: DC | PRN
  Administered 2014-04-13: 1 via TOPICAL

## 2014-04-13 MED ORDER — LACTATED RINGERS IV SOLN
INTRAVENOUS | Status: DC
  Administered 2014-04-13: 07:00:00 via INTRAVENOUS

## 2014-04-13 MED ORDER — AMLODIPINE BESYLATE 10 MG PO TABS
10.0000 mg | ORAL_TABLET | Freq: Every day | ORAL | Status: DC
  Administered 2014-04-13 – 2014-04-16 (×4): 10 mg via ORAL
  Filled 2014-04-13 (×4): qty 1

## 2014-04-13 MED ORDER — OXYCODONE HCL 5 MG/5ML PO SOLN: 5.0000 mg | Freq: Once | ORAL | Status: DC | PRN

## 2014-04-13 MED ORDER — HYDROMORPHONE HCL 1 MG/ML IJ SOLN
0.2500 mg | INTRAMUSCULAR | Status: DC | PRN
  Administered 2014-04-13 (×3): 0.5 mg via INTRAVENOUS

## 2014-04-13 MED ORDER — SODIUM CHLORIDE 0.9 % IJ SOLN
3.0000 mL | Freq: Two times a day (BID) | INTRAMUSCULAR | Status: DC
  Administered 2014-04-15 – 2014-04-17 (×4): 3 mL via INTRAVENOUS

## 2014-04-13 MED ORDER — ACYCLOVIR 800 MG PO TABS
400.0000 mg | ORAL_TABLET | Freq: Every day | ORAL | Status: DC
  Administered 2014-04-13 – 2014-04-16 (×4): 400 mg via ORAL
  Filled 2014-04-13 (×5): qty 1

## 2014-04-13 MED ORDER — SURGIFOAM 100 EX MISC
CUTANEOUS | Status: DC | PRN
  Administered 2014-04-13: 11:00:00 via TOPICAL

## 2014-04-13 MED ORDER — PROMETHAZINE HCL 25 MG/ML IJ SOLN: 6.2500 mg | INTRAMUSCULAR | Status: DC | PRN

## 2014-04-13 MED ORDER — GLYCOPYRROLATE 0.2 MG/ML IJ SOLN
INTRAMUSCULAR | Status: AC
  Filled 2014-04-13: qty 3

## 2014-04-13 MED ORDER — ONDANSETRON HCL 4 MG/2ML IJ SOLN: 4.0000 mg | INTRAMUSCULAR | Status: DC | PRN

## 2014-04-13 MED ORDER — ROSUVASTATIN CALCIUM 20 MG PO TABS
20.0000 mg | ORAL_TABLET | Freq: Every day | ORAL | Status: DC
  Administered 2014-04-14 – 2014-04-16 (×3): 20 mg via ORAL
  Filled 2014-04-13 (×5): qty 1

## 2014-04-13 MED ORDER — KETAMINE HCL 100 MG/ML IJ SOLN
INTRAMUSCULAR | Status: AC
  Filled 2014-04-13: qty 1

## 2014-04-13 MED ORDER — MIDAZOLAM HCL 5 MG/5ML IJ SOLN
INTRAMUSCULAR | Status: DC | PRN
  Administered 2014-04-13: 2 mg via INTRAVENOUS

## 2014-04-13 MED ORDER — 0.9 % SODIUM CHLORIDE (POUR BTL) OPTIME
TOPICAL | Status: DC | PRN
  Administered 2014-04-13: 1000 mL

## 2014-04-13 MED ORDER — NEOSTIGMINE METHYLSULFATE 10 MG/10ML IV SOLN
INTRAVENOUS | Status: DC | PRN
  Administered 2014-04-13: 3 mg via INTRAVENOUS

## 2014-04-13 MED ORDER — FENTANYL CITRATE 0.05 MG/ML IJ SOLN
INTRAMUSCULAR | Status: AC
  Filled 2014-04-13: qty 5

## 2014-04-13 MED ORDER — ROCURONIUM BROMIDE 50 MG/5ML IV SOLN
INTRAVENOUS | Status: AC
  Filled 2014-04-13: qty 1

## 2014-04-13 MED ORDER — HYDROCHLOROTHIAZIDE 25 MG PO TABS
25.0000 mg | ORAL_TABLET | Freq: Every day | ORAL | Status: DC
  Administered 2014-04-14 – 2014-04-17 (×4): 25 mg via ORAL
  Filled 2014-04-13 (×4): qty 1

## 2014-04-13 MED ORDER — OXYCODONE HCL ER 10 MG PO T12A
10.0000 mg | EXTENDED_RELEASE_TABLET | Freq: Two times a day (BID) | ORAL | Status: DC
  Administered 2014-04-13 – 2014-04-17 (×8): 10 mg via ORAL
  Filled 2014-04-13 (×8): qty 1

## 2014-04-13 MED ORDER — BUPIVACAINE HCL (PF) 0.5 % IJ SOLN
INTRAMUSCULAR | Status: DC | PRN
  Administered 2014-04-13: 20 mL

## 2014-04-13 MED ORDER — OXYCODONE HCL 5 MG PO TABS
5.0000 mg | ORAL_TABLET | Freq: Once | ORAL | Status: DC | PRN
Start: 2014-04-13 — End: 2014-04-13

## 2014-04-13 MED ORDER — NEOSTIGMINE METHYLSULFATE 10 MG/10ML IV SOLN
INTRAVENOUS | Status: AC
  Filled 2014-04-13: qty 1

## 2014-04-13 MED ORDER — SODIUM CHLORIDE 0.9 % IV SOLN: 250.0000 mL | INTRAVENOUS | Status: DC

## 2014-04-13 MED ORDER — LIDOCAINE HCL (CARDIAC) 20 MG/ML IV SOLN
INTRAVENOUS | Status: AC
  Filled 2014-04-13: qty 5

## 2014-04-13 MED ORDER — DEXAMETHASONE 4 MG PO TABS
4.0000 mg | ORAL_TABLET | Freq: Four times a day (QID) | ORAL | Status: AC
  Administered 2014-04-13: 4 mg via ORAL
  Filled 2014-04-13: qty 1

## 2014-04-13 MED ORDER — ONDANSETRON HCL 4 MG/2ML IJ SOLN
INTRAMUSCULAR | Status: AC
  Filled 2014-04-13: qty 2

## 2014-04-13 MED ORDER — LIDOCAINE HCL (CARDIAC) 20 MG/ML IV SOLN
INTRAVENOUS | Status: DC | PRN
  Administered 2014-04-13: 80 mg via INTRAVENOUS

## 2014-04-13 MED ORDER — ALBUTEROL SULFATE HFA 108 (90 BASE) MCG/ACT IN AERS: 2.0000 | INHALATION_SPRAY | RESPIRATORY_TRACT | Status: DC | PRN

## 2014-04-13 MED ORDER — MENTHOL 3 MG MT LOZG: 1.0000 | LOZENGE | OROMUCOSAL | Status: DC | PRN

## 2014-04-13 MED ORDER — LEVOTHYROXINE SODIUM 88 MCG PO TABS
88.0000 ug | ORAL_TABLET | Freq: Every day | ORAL | Status: DC
  Administered 2014-04-14 – 2014-04-17 (×4): 88 ug via ORAL
  Filled 2014-04-13 (×4): qty 1

## 2014-04-13 MED ORDER — OLOPATADINE HCL 0.1 % OP SOLN
1.0000 [drp] | Freq: Two times a day (BID) | OPHTHALMIC | Status: DC
  Administered 2014-04-13 – 2014-04-17 (×6): 1 [drp] via OPHTHALMIC
  Filled 2014-04-13: qty 5

## 2014-04-13 MED ORDER — SODIUM CHLORIDE 0.9 % IR SOLN
Status: DC | PRN
  Administered 2014-04-13: 11:00:00

## 2014-04-13 MED ORDER — ALBUTEROL SULFATE (2.5 MG/3ML) 0.083% IN NEBU
2.5000 mg | INHALATION_SOLUTION | Freq: Four times a day (QID) | RESPIRATORY_TRACT | Status: DC | PRN
  Administered 2014-04-15: 2.5 mg via RESPIRATORY_TRACT
  Filled 2014-04-13: qty 3

## 2014-04-13 MED ORDER — PHENOL 1.4 % MT LIQD
1.0000 | OROMUCOSAL | Status: DC | PRN
  Filled 2014-04-13: qty 177

## 2014-04-13 MED ORDER — ARTIFICIAL TEARS OP OINT
TOPICAL_OINTMENT | OPHTHALMIC | Status: DC | PRN
  Administered 2014-04-13: 1 via OPHTHALMIC

## 2014-04-13 MED ORDER — FLUOXETINE HCL 20 MG PO CAPS
20.0000 mg | ORAL_CAPSULE | Freq: Three times a day (TID) | ORAL | Status: DC
  Administered 2014-04-13 – 2014-04-17 (×12): 20 mg via ORAL
  Filled 2014-04-13 (×25): qty 1

## 2014-04-13 MED ORDER — SODIUM CHLORIDE 0.9 % IJ SOLN: 3.0000 mL | INTRAMUSCULAR | Status: DC | PRN

## 2014-04-13 MED ORDER — HYDROMORPHONE HCL 1 MG/ML IJ SOLN
1.0000 mg | INTRAMUSCULAR | Status: DC | PRN
  Administered 2014-04-13: 1 mg via INTRAMUSCULAR
  Administered 2014-04-13: 1.5 mg via INTRAMUSCULAR
  Administered 2014-04-14 – 2014-04-15 (×4): 1 mg via INTRAMUSCULAR
  Administered 2014-04-15: 1.5 mg via INTRAMUSCULAR
  Filled 2014-04-13: qty 2
  Filled 2014-04-13 (×2): qty 1
  Filled 2014-04-13: qty 2
  Filled 2014-04-13 (×3): qty 1

## 2014-04-13 MED ORDER — OXYCODONE-ACETAMINOPHEN 5-325 MG PO TABS
1.0000 | ORAL_TABLET | ORAL | Status: DC | PRN
  Administered 2014-04-14 – 2014-04-17 (×14): 2 via ORAL
  Filled 2014-04-13 (×18): qty 2

## 2014-04-13 MED ORDER — KCL IN DEXTROSE-NACL 20-5-0.45 MEQ/L-%-% IV SOLN
80.0000 mL/h | INTRAVENOUS | Status: DC
  Administered 2014-04-13 – 2014-04-14 (×2): 80 mL/h via INTRAVENOUS
  Filled 2014-04-13 (×3): qty 1000

## 2014-04-13 MED ORDER — HYDROMORPHONE HCL 1 MG/ML IJ SOLN
INTRAMUSCULAR | Status: AC
  Filled 2014-04-13: qty 2

## 2014-04-13 SURGICAL SUPPLY — 69 items
BAG DECANTER FOR FLEXI CONT (MISCELLANEOUS) ×2 IMPLANT
BENZOIN TINCTURE PRP APPL 2/3 (GAUZE/BANDAGES/DRESSINGS) ×4 IMPLANT
BLADE CLIPPER SURG (BLADE) IMPLANT
BONE EQUIVA 10CC (Bone Implant) ×2 IMPLANT
BRUSH SCRUB EZ PLAIN DRY (MISCELLANEOUS) ×2 IMPLANT
BUR CUTTER 7.0 ROUND (BURR) ×2 IMPLANT
BUR MATCHSTICK NEURO 3.0 LAGG (BURR) ×2 IMPLANT
CANISTER SUCT 3000ML (MISCELLANEOUS) ×2 IMPLANT
CONT SPEC 4OZ CLIKSEAL STRL BL (MISCELLANEOUS) ×4 IMPLANT
COVER BACK TABLE 60X90IN (DRAPES) ×2 IMPLANT
DRAPE C-ARM 42X72 X-RAY (DRAPES) ×4 IMPLANT
DRAPE C-ARMOR (DRAPES) ×2 IMPLANT
DRAPE LAPAROTOMY 100X72X124 (DRAPES) ×2 IMPLANT
DRAPE SURG 17X23 STRL (DRAPES) ×4 IMPLANT
DRSG OPSITE POSTOP 4X6 (GAUZE/BANDAGES/DRESSINGS) ×2 IMPLANT
DRSG TELFA 3X8 NADH (GAUZE/BANDAGES/DRESSINGS) IMPLANT
DURAPREP 26ML APPLICATOR (WOUND CARE) ×2 IMPLANT
ELECT REM PT RETURN 9FT ADLT (ELECTROSURGICAL) ×2
ELECTRODE REM PT RTRN 9FT ADLT (ELECTROSURGICAL) ×1 IMPLANT
EVACUATOR 1/8 PVC DRAIN (DRAIN) IMPLANT
GAUZE SPONGE 4X4 12PLY STRL (GAUZE/BANDAGES/DRESSINGS) IMPLANT
GAUZE SPONGE 4X4 16PLY XRAY LF (GAUZE/BANDAGES/DRESSINGS) IMPLANT
GLOVE BIOGEL PI IND STRL 7.0 (GLOVE) ×3 IMPLANT
GLOVE BIOGEL PI IND STRL 7.5 (GLOVE) ×1 IMPLANT
GLOVE BIOGEL PI INDICATOR 7.0 (GLOVE) ×3
GLOVE BIOGEL PI INDICATOR 7.5 (GLOVE) ×1
GLOVE ECLIPSE 8.0 STRL XLNG CF (GLOVE) ×4 IMPLANT
GLOVE ECLIPSE 9.0 STRL (GLOVE) ×2 IMPLANT
GLOVE EXAM NITRILE LRG STRL (GLOVE) IMPLANT
GLOVE EXAM NITRILE MD LF STRL (GLOVE) IMPLANT
GLOVE EXAM NITRILE XS STR PU (GLOVE) IMPLANT
GLOVE SS BIOGEL STRL SZ 6.5 (GLOVE) ×4 IMPLANT
GLOVE SUPERSENSE BIOGEL SZ 6.5 (GLOVE) ×4
GLOVE SURG SS PI 7.0 STRL IVOR (GLOVE) ×4 IMPLANT
GOWN STRL REUS W/ TWL LRG LVL3 (GOWN DISPOSABLE) ×2 IMPLANT
GOWN STRL REUS W/ TWL XL LVL3 (GOWN DISPOSABLE) ×4 IMPLANT
GOWN STRL REUS W/TWL 2XL LVL3 (GOWN DISPOSABLE) IMPLANT
GOWN STRL REUS W/TWL LRG LVL3 (GOWN DISPOSABLE) ×2
GOWN STRL REUS W/TWL XL LVL3 (GOWN DISPOSABLE) ×4
IMPLANT ARDIS PEEK 10X9X26 (Orthopedic Implant) ×4 IMPLANT
K-WIRE NITHNOL TROCAR TIP (WIRE) ×8 IMPLANT
KIT BASIN OR (CUSTOM PROCEDURE TRAY) ×2 IMPLANT
KIT ROOM TURNOVER OR (KITS) ×2 IMPLANT
LIQUID BAND (GAUZE/BANDAGES/DRESSINGS) IMPLANT
NEEDLE HYPO 22GX1.5 SAFETY (NEEDLE) ×2 IMPLANT
NEEDLE TARGETING (NEEDLE) ×8 IMPLANT
NS IRRIG 1000ML POUR BTL (IV SOLUTION) ×2 IMPLANT
PACK LAMINECTOMY NEURO (CUSTOM PROCEDURE TRAY) ×2 IMPLANT
PAD ARMBOARD 7.5X6 YLW CONV (MISCELLANEOUS) ×6 IMPLANT
PATTIES SURGICAL .75X.75 (GAUZE/BANDAGES/DRESSINGS) IMPLANT
PEDICLE ACCESS TOOL-SHEATH ×2 IMPLANT
ROD PATHFINDER 40MM (Rod) ×4 IMPLANT
SCREW POLYAXIA MIS 6.5X40MM (Screw) ×8 IMPLANT
SHEATH PAT (SHEATH) ×2 IMPLANT
SPONGE LAP 4X18 X RAY DECT (DISPOSABLE) IMPLANT
SPONGE SURGIFOAM ABS GEL 100 (HEMOSTASIS) ×2 IMPLANT
STRIP CLOSURE SKIN 1/2X4 (GAUZE/BANDAGES/DRESSINGS) ×4 IMPLANT
SUT PROLENE 0 CT 1 30 (SUTURE) IMPLANT
SUT VIC AB 0 CT1 18XCR BRD8 (SUTURE) ×2 IMPLANT
SUT VIC AB 0 CT1 8-18 (SUTURE) ×2
SUT VIC AB 2-0 OS6 18 (SUTURE) ×10 IMPLANT
SUT VIC AB 3-0 CP2 18 (SUTURE) ×2 IMPLANT
SYR 20ML ECCENTRIC (SYRINGE) ×2 IMPLANT
TOP CLSR SEQUOIA (Orthopedic Implant) ×8 IMPLANT
TOWEL OR 17X24 6PK STRL BLUE (TOWEL DISPOSABLE) ×2 IMPLANT
TOWEL OR 17X26 10 PK STRL BLUE (TOWEL DISPOSABLE) ×2 IMPLANT
TRAP SPECIMEN MUCOUS 40CC (MISCELLANEOUS) ×2 IMPLANT
TRAY FOLEY CATH 14FRSI W/METER (CATHETERS) ×2 IMPLANT
WATER STERILE IRR 1000ML POUR (IV SOLUTION) ×2 IMPLANT

## 2014-04-13 NOTE — Transfer of Care (Signed)
Immediate Anesthesia Transfer of Care Note  Patient: April Soto  Procedure(s) Performed: Procedure(s): Lumbar four-five Posterior Lumbar Interbody Fusion (N/A)  Patient Location: PACU  Anesthesia Type:General  Level of Consciousness: patient cooperative and responds to stimulation  Airway & Oxygen Therapy: Patient Spontanous Breathing and Patient connected to face mask oxygen  Post-op Assessment: Report given to PACU RN and Post -op Vital signs reviewed and stable  Post vital signs: Reviewed and stable  Complications: No apparent anesthesia complications

## 2014-04-13 NOTE — H&P (Signed)
April Soto is an 53 y.o. female.   Chief Complaint: Back and right leg pain HPI: The patient is a 53 year old female who had back surgery over a year ago for a disc herniation at L4-5 on the right. She initially did well but then developed recurrent pain and was found back in March of this year to have a recurrent disc abnormality. The options were discussed at that time and fusion was recommended. The patient was concerned about having additional surgery and basically disappeared from our care. Recently she was admitted to the hospital by the medical service because of increasing pain. She had an MRI scan which showed a persistent abnormality at L4-5 on the right. The options were discussed at that time infusion was once again recommended. The patient was discharged pending surgery. She was seen in the office the options were discussed the patient requested surgery now comes for a posterior lumbar interbody fusion at L4-5 with pedicle screw instrumentation. I've had a long discussion with her regarding the risks and benefits of surgical intervention. The risks discussed include but are not limited to bleeding infection weakness some as paralysis spinal fluid leak trouble with instrumentation nonunion coma and death. We have discussed alternative methods of therapy offered risks and benefits of nonintervention. She's had the opportunity to rest numerous questions and appears to understand. With this information in hand she has requested to proceed with surgery.  Past Medical History  Diagnosis Date  . Hyperlipidemia     takes Simvastatin daily  . Degenerative disc disease   . Osteoarthritis   . Bipolar 1 disorder   . History of rheumatic fever   . Hypertension     takes Coreg daily  . Muscle spasm     takes Flexeril daily as needed  . Seasonal allergies     takes Zyrtec daily and uses Dymista as well  . Constipation     takes Ra [employer] daily  . GERD (gastroesophageal reflux disease)    takes Nexium daily  . Hypothyroidism     takes Synthroid daily  . Anxiety     takes Ativan daily  . Fibromyalgia     takes Lyrica daily  . Peripheral edema     takes HCTZ daily  . Pneumonia     hx of;last time 30+yrs ago  . History of bronchitis     last time 15+yrs ago  . Headache(784.0)   . Weakness     nmbness and tingling-right leg  . Joint pain   . Chronic back pain     HNP  . Nocturia   . Urinary incontinence   . Depression     was on Prozac but taken off by MD for a couple of weeks  . Rheumatic fever     as a child  . Heart murmur     "grew out of"  . Stroke     ? 5 years ago. No lasting deficits  . Raynaud's disease   . Genital herpes   . Anemia     "years ago"    Past Surgical History  Procedure Laterality Date  . Tonsillectomy    . Septoplasty    . Tubal ligation    . Abdominal hysterectomy    . Cholecystectomy    . Other surgical history      Gastric stapling for weight loss. patient reports done in 1980's  . Lumbar laminectomy/decompression microdiscectomy Right 01/14/2013    Procedure: RIGHT LUMBAR FOUR FIVE  LAMINECTOMY/DECOMPRESSION  MICRODISCECTOMY 1 LEVEL;  Surgeon: Reinaldo Meekerandy O Gerry Blanchfield, MD;  Location: [facility];  Service: Neurosurgery;  Laterality: Right;  RIGHT L45 microdiskectomy  . Colonoscopy    . Esophagogastroduodenoscopy    . Foot surgery Right 12/2013    ankle, heel and top of foot    Family History  Problem Relation Age of Onset  . Diabetes type II Mother   . Hypertension Mother    Social History:  reports that she has been smoking Cigarettes.  She has a 20 pack-year smoking history. She has never used smokeless tobacco. She reports that she does not drink alcohol or use illicit drugs.  Allergies:  Allergies  Allergen Reactions  . Amoxicillin Itching and Other (See Comments)    depression  . Celebrex [Celecoxib] Hives and Itching    Medications Prior to Admission  Medication Sig Dispense Refill  . acyclovir (ZOVIRAX) 400 MG  tablet Take 400 mg by mouth at bedtime.     Marland Kitchen. albuterol (PROAIR HFA) 108 (90 BASE) MCG/ACT inhaler Inhale 2 puffs into the lungs every 4 (four) hours as needed for wheezing or shortness of breath.     Marland Kitchen. amLODipine (NORVASC) 10 MG tablet Take 10 mg by mouth at bedtime.     . cetirizine (ZYRTEC) 10 MG tablet Take 10 mg by mouth at bedtime as needed for allergies.     Marland Kitchen. CRESTOR 20 MG tablet Take 20 mg by mouth daily.  0  . cyclobenzaprine (FLEXERIL) 10 MG tablet Take 1 tablet (10 mg total) by mouth 3 (three) times daily as needed for muscle spasms. 50 tablet 2  . esomeprazole (NEXIUM) 40 MG capsule Take 40 mg by mouth at bedtime as needed (acid reflux).     Marland Kitchen. FLUoxetine (PROZAC) 20 MG tablet Take 20 mg by mouth 3 (three) times daily.     . fluticasone (FLONASE) 50 MCG/ACT nasal spray Place 1 spray into both nostrils at bedtime.     . hydrochlorothiazide (HYDRODIURIL) 25 MG tablet Take 25 mg by mouth daily.  0  . hydrocortisone (ANUSOL-HC) 2.5 % rectal cream Place 1 application rectally at bedtime as needed for hemorrhoids or itching.    Marland Kitchen. HYDROmorphone (DILAUDID) 4 MG tablet Take 1 tablet (4 mg total) by mouth every 4 (four) hours as needed for severe pain. 50 tablet 0  . levothyroxine (SYNTHROID, LEVOTHROID) 88 MCG tablet Take 88 mcg by mouth daily before breakfast.   0  . lidocaine (LIDODERM) 5 % Place 2 patches onto the skin daily. Remove & Discard patch within 12 hours or as directed by MD 20 patch 0  . LORazepam (ATIVAN) 1 MG tablet Take 1 mg by mouth 3 (three) times daily.    . Olopatadine HCl (PATADAY) 0.2 % SOLN Place 1 drop into both eyes at bedtime as needed (allergies).    Marland Kitchen. OVER THE COUNTER MEDICATION 1 capsule as needed (daily as needed).    . OxyCODONE (OXYCONTIN) 10 mg T12A 12 hr tablet Take 1 tablet (10 mg total) by mouth every 12 (twelve) hours. 20 tablet 0  . pregabalin (LYRICA) 150 MG capsule Take 150 mg by mouth 3 (three) times daily.    Marland Kitchen. senna (SENOKOT) 8.6 MG TABS tablet Take 1  tablet (8.6 mg total) by mouth daily as needed for moderate constipation. 20 each 0    No results found for this or any previous visit (from the past 48 hour(s)). No results found.  Positive for some balance disturbance along with high blood pressure  high cholesterol anxiety depression  Blood pressure 166/94, pulse 98, temperature 98.3 F (36.8 C), temperature source Oral, resp. rate 16, height 5\' 6"  (1.676 m), weight 107.956 kg (238 lb), SpO2 97 %.  The patient is awake alert and oriented. She has no facial asymmetry. She has some weakness of the extensor hallucis longus on the right. Her sensation is decreased on the dorsum of her foot. She has decreased reflexes diffusely Assessment/Plan Impression is that of recurrent disc herniation at L4-5. The plan is for an L4-5 discectomy with interbody fusion and pedicle screw fixation.  Reinaldo Meeker, MD 04/13/2014, 9:48 AM

## 2014-04-13 NOTE — Op Note (Signed)
Preop diagnosis: Herniated disc L4-5 right, recurrent Postop diagnosis: Same Procedure: Bilateral L4-5 decompressive laminectomy Bilateral L4-5 microdiscectomy for herniated disc L4-5 posterior lumbar interbody fusion with peek interbody spacer L4-5 nonsegmental instrumentation with [employer]finder percutaneous pedicle screw system L4-5 posterolateral fusion Surgeon: Eliazar Olivar Asst.: Pool  After and placed the prone position the patient's back was prepped and draped in the usual sterile fashion. Localizing fluoroscopy was used prior to incision to identify the appropriate level. Midline incision was made above the spinous processes of L4 and L5. The incision was carried on the dorsal lumbar fascia and the plane between the subcuticular tissue and the fascia was dissected free. We then did a subperiosteal dissection on the spinous processes of L4 and L5 to expose the lamina facet joint bilaterally. The edges of the previous laminotomy on the right were identified. Self retaining retractor was placed for exposure and x-ray showed approach the appropriate level. Spine is processes were then removed. Starting the patient's left side generous laminotomy was performed removing the inferior one half of the L4 lamina the medial three quarters of the facet joint and the superior one third of the L5 lamina. Residual ligamentum flavum removed in a piecemeal fashion. Similar decompression was then carried out on the right side with great care to avoid injury to the neural elements in the area of the previous laminotomy. This was successfully done. We then removed scar tissue and began to dissect out the foramen. We identified a large disc herniation removed in a piecemeal fashion then entered the disc space without difficulty. As removed more of the disc material were able to identify our landmarks were clearly identify the L5 nerve root could around the pedicle. Thorough disc space cleanout was carried out once same time  great care was taken to avoid injury to the neural elements and this was successfully done. We then prepared the disc space for interbody fusion. We distracted up to a 10 mm size and felt this was a good choice. We decorticated the endplates cleaned of all soft tissue. We filled 2 peek spacers with a mixture of autologous bone morselized allograft. We impacted for spacer removed the distractor.  Placing the second spacer we placed a mixture of autologous bone morselized allograft deep within the interspace to help with the interbody fusions. We then placed a second space without difficulty. Fossae showed these to be in good position. We decorticated the facet joint place autologous bone morselized allograft therefore posterolateral fusion. We then irrigated copiously controlled any bleeders proper coagulation Gelfoam and closed the fashion the midline. We then did percutaneous pedicle screws at L4-5 in standard fashion without difficulty. We passed a Jamshidi needle and replaced with a guidewire. We tapped over the guidewire and then placed 6.5 mm x 40 mm screws at L4 and L5 bilaterally. We followed these into good position AP lateral fluoroscopy. We then passed appropriate length rods down the towers secured them the top the screws with top loading caps. We did final tightening and final tightening with torque and counter torque and then removed the [employer]. Final fluoroscopy in AP lateral direction looked excellent. We then irrigated copiously controlled fascial incisions with 0 Vicryl. We then closed the wound in multiple layers of Vicryl on the subcutaneous and subcuticular tissues were used a running locking Prolene on the skin. A sterile dressing was then applied and the patient was extubated and taken to recovery in stable condition.

## 2014-04-13 NOTE — Anesthesia Preprocedure Evaluation (Addendum)
Anesthesia Evaluation  Patient identified by MRN, date of birth, ID band Patient awake    Reviewed: Allergy & Precautions, H&P , NPO status , Patient's Chart, lab work & pertinent test results  Airway Mallampati: III  TM Distance: <3 FB Neck ROM: Full    Dental   Pulmonary asthma , Current Smoker,          Cardiovascular hypertension, Pt. on medications + Peripheral Vascular Disease     Neuro/Psych  Headaches, Anxiety Depression Bipolar Disorder    GI/Hepatic Neg liver ROS, GERD-  ,  Endo/Other  Hypothyroidism Morbid obesity  Renal/GU negative Renal ROS     Musculoskeletal  (+) Arthritis -, Fibromyalgia -, narcotic dependent  Abdominal   Peds  Hematology negative hematology ROS (+)   Anesthesia Other Findings   Reproductive/Obstetrics                            Anesthesia Physical Anesthesia Plan  ASA: III  Anesthesia Plan: General   Post-op Pain Management:    Induction: Intravenous  Airway Management Planned: Oral ETT  Additional Equipment:   Intra-op Plan:   Post-operative Plan: Extubation in OR  Informed Consent: I have reviewed the patients History and Physical, chart, labs and discussed the procedure including the risks, benefits and alternatives for the proposed anesthesia with the patient or authorized representative who has indicated his/her understanding and acceptance.   Dental advisory given  Plan Discussed with: CRNA and Surgeon  Anesthesia Plan Comments:         Anesthesia Quick Evaluation

## 2014-04-13 NOTE — Anesthesia Postprocedure Evaluation (Signed)
  Anesthesia Post-op Note  Patient: April Soto  Procedure(s) Performed: Procedure(s): Lumbar four-five Posterior Lumbar Interbody Fusion (N/A)  Patient Location: PACU  Anesthesia Type:General  Level of Consciousness: awake and alert   Airway and Oxygen Therapy: Patient Spontanous Breathing  Post-op Pain: mild  Post-op Assessment: Post-op Vital signs reviewed  Post-op Vital Signs: Reviewed  Last Vitals:  Filed Vitals:   04/13/14 1422  BP:   Pulse:   Temp: 36.3 C  Resp:     Complications: No apparent anesthesia complications

## 2014-04-13 NOTE — Progress Notes (Signed)
Utilization review completed.  

## 2014-04-13 NOTE — Progress Notes (Signed)
Called for sign out

## 2014-04-13 NOTE — Progress Notes (Signed)
ANTIBIOTIC CONSULT NOTE - INITIAL  Pharmacy Consult for vanc   Indication: post op prophylaxis  Allergies  Allergen Reactions  . Amoxicillin Itching and Other (See Comments)    depression  . Celebrex [Celecoxib] Hives and Itching    Patient Measurements: Height: 5\' 6"  (167.6 cm) Weight: 238 lb (107.956 kg) IBW/kg (Calculated) : 59.3 Adjusted Body Weight:   Vital Signs: Temp: 98.4 F (36.9 C) (12/31 1441) Temp Source: Oral (12/31 1441) BP: 143/96 mmHg (12/31 1441) Pulse Rate: 98 (12/31 1410) Intake/Output from previous day:   Intake/Output from this shift: Total I/O In: 2400 [I.V.:2400] Out: 315 [Urine:165; Blood:150]  Labs: No results for input(s): WBC, HGB, PLT, LABCREA, CREATININE in the last 72 hours. Estimated Creatinine Clearance: 98.8 mL/min (by C-G formula based on Cr of 0.61). No results for input(s): VANCOTROUGH, VANCOPEAK, VANCORANDOM, GENTTROUGH, GENTPEAK, GENTRANDOM, TOBRATROUGH, TOBRAPEAK, TOBRARND, AMIKACINPEAK, AMIKACINTROU, AMIKACIN in the last 72 hours.   Microbiology: Recent Results (from the past 720 hour(s))  MRSA PCR Screening     Status: None   Collection Time: 04/02/14 11:34 PM  Result Value Ref Range Status   MRSA by PCR NEGATIVE NEGATIVE Final    Comment:        The GeneXpert MRSA Assay (FDA approved for NASAL specimens only), is one component of a comprehensive MRSA colonization surveillance program. It is not intended to diagnose MRSA infection nor to guide or monitor treatment for MRSA infections.     Medical History: Past Medical History  Diagnosis Date  . Hyperlipidemia     takes Simvastatin daily  . Degenerative disc disease   . Osteoarthritis   . Bipolar 1 disorder   . History of rheumatic fever   . Hypertension     takes Coreg daily  . Muscle spasm     takes Flexeril daily as needed  . Seasonal allergies     takes Zyrtec daily and uses Dymista as well  . Constipation     takes Ra [employer] daily  . GERD  (gastroesophageal reflux disease)     takes Nexium daily  . Hypothyroidism     takes Synthroid daily  . Anxiety     takes Ativan daily  . Fibromyalgia     takes Lyrica daily  . Peripheral edema     takes HCTZ daily  . Pneumonia     hx of;last time 30+yrs ago  . History of bronchitis     last time 15+yrs ago  . Headache(784.0)   . Weakness     nmbness and tingling-right leg  . Joint pain   . Chronic back pain     HNP  . Nocturia   . Urinary incontinence   . Depression     was on Prozac but taken off by MD for a couple of weeks  . Rheumatic fever     as a child  . Heart murmur     "grew out of"  . Stroke     ? 5 years ago. No lasting deficits  . Raynaud's disease   . Genital herpes   . Anemia     "years ago"    Medications:  Scheduled:  . acyclovir  400 mg Oral QHS  . amLODipine  10 mg Oral QHS  . dexamethasone  4 mg Intravenous 4 times per day   Or  . dexamethasone  4 mg Oral 4 times per day  . FLUoxetine  20 mg Oral TID  . fluticasone  1 spray Each Nare QHS  .  hydrochlorothiazide  25 mg Oral Daily  . HYDROmorphone      . [START ON 04/14/2014] levothyroxine  88 mcg Oral QAC breakfast  . LORazepam  1 mg Oral TID  . olopatadine  1 drop Both Eyes BID  . OxyCODONE  10 mg Oral Q12H  . pantoprazole (PROTONIX) IV  40 mg Intravenous QHS  . pregabalin  150 mg Oral TID  . rosuvastatin  20 mg Oral Daily  . sodium chloride  3 mL Intravenous Q12H  . vancomycin  1,000 mg Intravenous Once   Infusions:  . sodium chloride    . dextrose 5 % and 0.45 % NaCl with KCl 20 mEq/L     Assessment: 53 yo who is s/p lumbar fusion. She has a hx allergy to PCN and got 1 dose of vanc preop. She does not have a drain per Rn so will only need 1 dose of vanc post op.   Plan:   Vanc 1g IV x1 post op Rx will sign off  Ulyses SouthwardMinh Josephene Marrone, PharmD Pager: [phone] 04/13/2014 2:58 PM

## 2014-04-14 MED ORDER — PANTOPRAZOLE SODIUM 40 MG PO TBEC
40.0000 mg | DELAYED_RELEASE_TABLET | Freq: Every day | ORAL | Status: DC
Start: 2014-04-14 — End: 2014-04-17
  Administered 2014-04-14 – 2014-04-16 (×3): 40 mg via ORAL
  Filled 2014-04-14 (×3): qty 1

## 2014-04-14 NOTE — Progress Notes (Signed)
Patient complaining of headache rated 8/10 after attempt to ambulate. Pt now flat in bed. Pain medications given. Md notified. RN will continue to monitor.

## 2014-04-14 NOTE — Progress Notes (Signed)
Orthopedic Tech Progress Note Patient Details:  April Soto 1961/02/16 [id number] Patient has brace in room Patient ID: April Soto, female   DOB: Nov 29, 1960, 54 y.o.   MRN: [id number]   April Soto 04/14/2014, 4:33 PM

## 2014-04-14 NOTE — Progress Notes (Signed)
Patient ID: ACIRE TANG, female   DOB: 02-Aug-1960, 54 y.o.   MRN: [id number] Subjective:  The patient is alert and pleasant. She is in no apparent distress. Her back is appropriately sore.  Objective: Vital signs in last 24 hours: Temp:  [97.3 F (36.3 C)-98.6 F (37 C)] 98.4 F (36.9 C) (01/01 0914) Pulse Rate:  [98-109] 100 (01/01 0914) Resp:  [9-20] 20 (01/01 0914) BP: (109-152)/(57-96) 109/69 mmHg (01/01 0914) SpO2:  [93 %-100 %] 93 % (01/01 0914)  Intake/Output from previous day: 12/31 0701 - 01/01 0700 In: 2400 [I.V.:2400] Out: 2165 [Urine:2015; Blood:150] Intake/Output this shift: Total I/O In: -  Out: 125 [Urine:125]  Physical exam the patient is alert and oriented. She is moving her lower extremities well.  Lab Results: No results for input(s): WBC, HGB, HCT, PLT in the last 72 hours. BMET No results for input(s): NA, K, CL, CO2, GLUCOSE, BUN, CREATININE, CALCIUM in the last 72 hours.  Studies/Results: Dg Lumbar Spine 2-3 Views  04/13/2014   CLINICAL DATA:  Lumbar fusion.  EXAM: DG C-ARM GT 120 MIN; LUMBAR SPINE - 2-3 VIEW  COMPARISON:  CT scan 05/17/2013.  FINDINGS: There are pedicle screws, posterior rods and interbody fusion device at L4-5. Well-positioned hardware without complicating features.  IMPRESSION: L4-5 lumbar fusion.   Electronically Signed   By: Loralie Champagne M.D.   On: 04/13/2014 13:20   Dg C-arm Gt 120 Min  04/13/2014   CLINICAL DATA:  Lumbar fusion.  EXAM: DG C-ARM GT 120 MIN; LUMBAR SPINE - 2-3 VIEW  COMPARISON:  CT scan 05/17/2013.  FINDINGS: There are pedicle screws, posterior rods and interbody fusion device at L4-5. Well-positioned hardware without complicating features.  IMPRESSION: L4-5 lumbar fusion.   Electronically Signed   By: Loralie Champagne M.D.   On: 04/13/2014 13:20    Assessment/Plan: Postop day #1: The patient is doing well. We will mobilize her. She may be able to go home tomorrow.  LOS: 1 day     Ja Pistole  D 04/14/2014, 11:01 AM

## 2014-04-15 MED ORDER — ALUM & MAG HYDROXIDE-SIMETH 200-200-20 MG/5ML PO SUSP
30.0000 mL | ORAL | Status: DC | PRN
  Administered 2014-04-15: 30 mL via ORAL
  Filled 2014-04-15: qty 30

## 2014-04-15 MED ORDER — HYDROMORPHONE HCL 1 MG/ML IJ SOLN
1.0000 mg | INTRAMUSCULAR | Status: DC | PRN
  Administered 2014-04-15 – 2014-04-17 (×10): 1 mg via INTRAVENOUS
  Filled 2014-04-15 (×10): qty 1

## 2014-04-15 NOTE — Progress Notes (Signed)
Bedside RN attempted to ambulate pt. Percocet and Flexeril were given, and 30 minutes later, pt sat on side of bed. Pt attempted to stand up to get to the chair, but she had pain at the incision site of 10/10 and could not stand up because of it. There was no associated headache, only pain at the incision site. Will notify MD. Will continue to monitor pt.

## 2014-04-15 NOTE — Progress Notes (Signed)
Pt got up to chair from 1640 to 1715, with brace and walker. Pt then voided in bedside commode. Will continue to monitor pt.

## 2014-04-15 NOTE — Progress Notes (Signed)
Patient ID: April Soto, female   DOB: 02-17-1961, 54 y.o.   MRN: [id number] Patient well no leg pain back pain is manageable had some severe back pain when out of bed last night felt better when she applied down currently not having any headache  Strength out of 5 wound clean dry and intact  Continue mobilizes today with physical and occupational therapy. No evidence on Dr. Trudee Grip operative note of a spinal fluid leak so I think this may just be muscle spasm. If the patient mobilized today and again experiences a postural headache we may have to consider put back flat.

## 2014-04-15 NOTE — Progress Notes (Signed)
New orders received for change in pain medication. Will continue to monitor pt, and will attempt ambulation again when pt's pain is under control.

## 2014-04-15 NOTE — Progress Notes (Addendum)
Attempted to ambulate patient to the bathroom. Pt complained of severe low back pain and burning when attempting to stand. Pt would not stand due to back pain. Pt placed on bedpan and pain medications given. Will continue to monitor.

## 2014-04-16 NOTE — Evaluation (Signed)
Physical Therapy Evaluation Patient Details Name: April Soto MRN: [id number] DOB: April 28, 1960 Today's Date: 04/16/2014   History of Present Illness  pt is 54 y.o. s/p Bilateral L4-5 decompressive laminectomy.   Clinical Impression  Patient is s/p above surgery resulting in the deficits listed below (see PT Problem List). Patient will benefit from skilled PT to increase their independence and safety with mobility (while adhering to their precautions) to allow discharge to the venue listed below. Pt ho[peful to D/C home tomorrow. Pt denied any radiating pain in Rt LE this session.     Follow Up Recommendations No PT follow up;Supervision/Assistance - 24 hour    Equipment Recommendations  None recommended by PT    Recommendations for Other Services OT consult     Precautions / Restrictions Precautions Precautions: Fall;Back Precaution Booklet Issued: Yes (comment) Precaution Comments: pt given handout and educated on back precautions  Required Braces or Orthoses: Spinal Brace Spinal Brace: Lumbar corset;Applied in sitting position Restrictions Weight Bearing Restrictions: No      Mobility  Bed Mobility Overal bed mobility: Needs Assistance Bed Mobility: Rolling;Sit to Sidelying Rolling: Supervision       Sit to sidelying: Supervision General bed mobility comments: cues for log rolling technique   Transfers Overall transfer level: Needs assistance Equipment used: Rolling walker (2 wheeled) Transfers: Sit to/from Stand Sit to Stand: Min guard         General transfer comment: transferred from bed and 3 in 1; cues for hand placement; min guard to steady  Ambulation/Gait Ambulation/Gait assistance: Min guard Ambulation Distance (Feet): 60 Feet Assistive device: Rolling walker (2 wheeled) Gait Pattern/deviations: Step-through pattern;Decreased stride length;Wide base of support Gait velocity: guarded/decr Gait velocity interpretation: Below normal speed for  age/gender General Gait Details: cues for upright posture and gt sequencing; min guard to steady primarily with directional changes   Stairs            Wheelchair Mobility    Modified Rankin (Stroke Patients Only)       Balance Overall balance assessment: Needs assistance Sitting-balance support: Feet supported;No upper extremity supported Sitting balance-Leahy Scale: Good     Standing balance support: During functional activity;Bilateral upper extremity supported Standing balance-Leahy Scale: Poor Standing balance comment: RW to balance                              Pertinent Vitals/Pain Pain Assessment: 0-10 Pain Score: 7  Pain Location: surgical site; denies radiating pain in Rt LE  Pain Descriptors / Indicators: Aching;Sore Pain Intervention(s): Monitored during session;Premedicated before session;Repositioned;Patient requesting pain meds-RN notified    Home Living Family/patient expects to be discharged to:: Private residence Living Arrangements: Other relatives Available Help at Discharge: Family;Available 24 hours/day Type of Home: Apartment Home Access: Level entry     Home Layout: One level Home Equipment: Walker - 2 wheels;Cane - single point;Cane - quad;Bedside commode;Tub bench;Wheelchair - manual Additional Comments: has been living with sister     Prior Function Level of Independence: Independent with assistive device(s)   Gait / Transfers Assistance Needed: has been requiring setup (A) only for bird bath; ambulates with RW or cane           Hand Dominance   Dominant Hand: Right    Extremity/Trunk Assessment   Upper Extremity Assessment: Defer to OT evaluation           Lower Extremity Assessment: Generalized weakness      Cervical /  Trunk Assessment: Normal  Communication   Communication: No difficulties  Cognition Arousal/Alertness: Awake/alert Behavior During Therapy: WFL for tasks assessed/performed Overall  Cognitive Status: Within Functional Limits for tasks assessed       Memory: Decreased recall of precautions              General Comments      Exercises        Assessment/Plan    PT Assessment Patient needs continued PT services  PT Diagnosis Difficulty walking;Generalized weakness;Acute pain   PT Problem List Decreased activity tolerance;Decreased balance;Decreased mobility;Decreased knowledge of use of DME;Decreased safety awareness;Decreased knowledge of precautions;Pain;Decreased strength  PT Treatment Interventions DME instruction;Gait training;Functional mobility training;Therapeutic activities;Therapeutic exercise;Balance training;Patient/family education;Neuromuscular re-education   PT Goals (Current goals can be found in the Care Plan section) Acute Rehab PT Goals Patient Stated Goal: to go home tomorrow PT Goal Formulation: With patient Time For Goal Achievement: 04/22/14 Potential to Achieve Goals: Good    Frequency Min 5X/week   Barriers to discharge        Co-evaluation               End of Session Equipment Utilized During Treatment: Gait belt;Back brace Activity Tolerance: Patient tolerated treatment well Patient left: in bed;with bed alarm set;with call bell/phone within reach Nurse Communication: Patient requests pain meds;Mobility status;Precautions         Time: 9604-5409 PT Time Calculation (min) (ACUTE ONLY): 15 min   Charges:   PT Evaluation $Initial PT Evaluation Tier I: 1 Procedure PT Treatments $Gait Training: 8-22 mins   PT G CodesDonell Soto, April Soto  [phone] 04/16/2014, 11:19 AM

## 2014-04-16 NOTE — Progress Notes (Signed)
Doing fairly well, back sore, no leg pain or NTW. Good DF/PF. OOB to chair at present and appears comfortable. States she would like to stay 1 more day and go home tomorrow. Continue PT and OT.

## 2014-04-16 NOTE — Progress Notes (Signed)
Pt able to ambulate to bathroom after taking oral pain meds only. Pt back in bed, rates her pain as 7/10 after activity. Will continue to monitor.

## 2014-04-17 MED ORDER — HYDROMORPHONE HCL 4 MG PO TABS: 4.0000 mg | ORAL_TABLET | ORAL | Status: DC | PRN

## 2014-04-17 NOTE — Progress Notes (Signed)
Discharged Pt told this nurse "don't want to go home" due to sore throat" Given chloraseptic spray to try if helpful. States later "don't want to go home and come back" because "back of ankle hurts" and requesting bedside commode for needs. Reports "had foot surgery 4 months ago" when asked what happened to the foot. Explained and signed D/C instructions and asked if son still coming for ride. Son called Nurse 10 minutes later saying over the phone "mother talking out of head" when called her. Went to room to assess pt who is alert, and oriented; handled phone to pt to talk to son. Came to room. Concerned about mother mentation. Explained to son mother verbalized no desire to go home for diffferent reasons but has been A/O x 4 until his call, and that  no change in LOC or mentation noted. Requested to talk to a different because this Clinical research associate calling mother "a Chartered loss adjuster called to get involve in the matter.

## 2014-04-17 NOTE — Discharge Summary (Signed)
Patient ID: April Soto, female   DOB: 10/02/1960, 54 y.o.   MRN: [id number] Physician Discharge Summary  Patient ID: April Soto MRN: [id number] DOB/AGE: [birth year] 54 y.o.  Admit date: 04/13/2014 Discharge date: 04/17/2014  Admission Diagnoses:  Discharge Diagnoses:  Active Problems:   Herniated nucleus pulposus, lumbar   Discharged Condition: good  Hospital Course: Surgery 4 days ago for HNP L 45. Did very well. Marked improvement in her pain. Ambulated in halls well. Wound healing well. Home pod 4, specific instructions given.  Consults: None  Significant Diagnostic Studies: None  Treatments: surgery: L 45 PLIF  Discharge Exam: Blood pressure 125/74, pulse 86, temperature 98.3 F (36.8 C), temperature source Oral, resp. rate 20, height  (1.676 m), weight 107.956 kg (238 lb), SpO2 96 %. Incision/Wound:clean and dry; no new neuro deficits  Disposition: 01-Home or Self Care     Medication List    ASK your doctor about these medications        acyclovir 400 MG tablet  Commonly known as:  ZOVIRAX  Take 400 mg by mouth at bedtime.     amLODipine 10 MG tablet  Commonly known as:  NORVASC  Take 10 mg by mouth at bedtime.     cetirizine 10 MG tablet  Commonly known as:  ZYRTEC  Take 10 mg by mouth at bedtime as needed for allergies.     CRESTOR 20 MG tablet  Generic drug:  rosuvastatin  Take 20 mg by mouth daily.     cyclobenzaprine 10 MG tablet  Commonly known as:  FLEXERIL  Take 1 tablet (10 mg total) by mouth 3 (three) times daily as needed for muscle spasms.     esomeprazole 40 MG capsule  Commonly known as:  NEXIUM  Take 40 mg by mouth at bedtime as needed (acid reflux).     FLUoxetine 20 MG tablet  Commonly known as:  PROZAC  Take 20 mg by mouth 3 (three) times daily.     fluticasone 50 MCG/ACT nasal spray  Commonly known as:  FLONASE  Place 1 spray into both nostrils at bedtime.     hydrochlorothiazide 25 MG tablet  Commonly known as:   HYDRODIURIL  Take 25 mg by mouth daily.     hydrocortisone 2.5 % rectal cream  Commonly known as:  ANUSOL-HC  Place 1 application rectally at bedtime as needed for hemorrhoids or itching.     HYDROmorphone 4 MG tablet  Commonly known as:  DILAUDID  Take 1 tablet (4 mg total) by mouth every 4 (four) hours as needed for severe pain.     levothyroxine 88 MCG tablet  Commonly known as:  SYNTHROID, LEVOTHROID  Take 88 mcg by mouth daily before breakfast.     lidocaine 5 %  Commonly known as:  LIDODERM  Place 2 patches onto the skin daily. Remove & Discard patch within 12 hours or as directed by MD     LORazepam 1 MG tablet  Commonly known as:  ATIVAN  Take 1 mg by mouth 3 (three) times daily.     OVER THE COUNTER MEDICATION  1 capsule as needed (daily as needed).     OxyCODONE 10 mg T12a 12 hr tablet  Commonly known as:  OXYCONTIN  Take 1 tablet (10 mg total) by mouth every 12 (twelve) hours.     PATADAY 0.2 % Soln  Generic drug:  Olopatadine HCl  Place 1 drop into both eyes at bedtime as needed (allergies).  pregabalin 150 MG capsule  Commonly known as:  LYRICA  Take 150 mg by mouth 3 (three) times daily.     PROAIR HFA 108 (90 BASE) MCG/ACT inhaler  Generic drug:  albuterol  Inhale 2 puffs into the lungs every 4 (four) hours as needed for wheezing or shortness of breath.     senna 8.6 MG Tabs tablet  Commonly known as:  SENOKOT  Take 1 tablet (8.6 mg total) by mouth daily as needed for moderate constipation.         At home rest most of the time. Get up 9 or 10 times each day and take a 15 or 20 minute walk. No riding in the car and to your first postoperative appointment. If you have neck surgery you may shower from the chest down starting on the third postoperative day. If you had back surgery he may start showering on the third postoperative day with saran wrap wrapped around your incisional area 3 times. After the shower remove the saran wrap. Take pain medicine  as needed and other medications as instructed. Call my office for an appointment.  SignedReinaldo Meeker, MD 04/17/2014, 9:07 AM

## 2014-04-17 NOTE — Progress Notes (Signed)
Physical Therapy Treatment Patient Details Name: April Soto MRN: [id number] DOB: 29-Jul-1960 Today's Date: 04/17/2014    History of Present Illness pt is 54 y.o. s/p Bilateral L4-5 decompressive laminectomy.     PT Comments    Patient progressing slowly with ambulation but was limited by increased R ankle pain (previous surgery). Patient stated when she ambulated to the bathroom earlier she heard a "pop" and it has hurt ever since. RN made aware. Will continue with POC and ambulate patient as she is able to tolerate  Follow Up Recommendations  No PT follow up;Supervision/Assistance - 24 hour     Equipment Recommendations  None recommended by PT    Recommendations for Other Services       Precautions / Restrictions Precautions Precautions: Fall;Back Precaution Comments: Able to recall 2/3 precautions.  Required Braces or Orthoses: Spinal Brace Spinal Brace: Lumbar corset;Applied in sitting position Restrictions Weight Bearing Restrictions: No    Mobility  Bed Mobility Overal bed mobility: Needs Assistance     Sidelying to sit: Min guard     Sit to sidelying: Min assist General bed mobility comments: cues for log rolling technique. Min A for legs back into bed and positioning  Transfers Overall transfer level: Needs assistance Equipment used: Rolling walker (2 wheeled)   Sit to Stand: Min guard         General transfer comment: Cues for safe technique. Increased pain in ankle with 3rd stand  Ambulation/Gait Ambulation/Gait assistance: Min guard Ambulation Distance (Feet): 80 Feet Assistive device: Rolling walker (2 wheeled) Gait Pattern/deviations: Step-to pattern;Decreased stance time - right;Decreased step length - left Gait velocity: guarded/decr   General Gait Details: cues for upright posture and gt sequencing; Patient tends to keep RW too far out. Increased pain in R ankle limiting ambulation distance   Stairs            Wheelchair  Mobility    Modified Rankin (Stroke Patients Only)       Balance                                    Cognition Arousal/Alertness: Awake/alert Behavior During Therapy: WFL for tasks assessed/performed Overall Cognitive Status: Within Functional Limits for tasks assessed       Memory: Decreased recall of precautions              Exercises      General Comments        Pertinent Vitals/Pain Pain Score: 7  Pain Location: 7/10 back and 10/10 ankle at end of session Pain Descriptors / Indicators: Aching;Sore Pain Intervention(s): Monitored during session;Premedicated before session    Home Living                      Prior Function            PT Goals (current goals can now be found in the care plan section) Progress towards PT goals: Progressing toward goals    Frequency  Min 5X/week    PT Plan Current plan remains appropriate    Co-evaluation             End of Session Equipment Utilized During Treatment: Back brace Activity Tolerance: Patient limited by pain Patient left: in bed;with call bell/phone within reach;with bed alarm set     Time: 0803-0827 PT Time Calculation (min) (ACUTE ONLY): 24 min  Charges:  $Gait Training: 8-22  mins $Therapeutic Activity: 8-22 mins                    G Codes:      Fredrich Birks 04/17/2014, 9:05 AM 04/17/2014 Fredrich Birks PTA [phone] pager [phone] office

## 2014-04-17 NOTE — Progress Notes (Signed)
Called to room to speak with patient and her son. Per pt. She "twisted her ankle earlier" and has "notified everyone who came in the room." Pt.'s son also endorsed that pt. Was talking out of her head while on phone earlier. Pt. Alert and oriented x 4. RN, Ayaba and NT Renique reports pt. Never notified her of "twisting her ankle." Pt.'s son threatening staff about sueing the hospital if she gets home and something happens. Reported to son that this Clinical research associate would call the MD and go from there. Pt. And her son refused this and stated they wanted to go home and not to worry about calling the doctor. This was stated in front of this writer, Thea Alken, RN and Maralyn Sago, Charity fundraiser. Pt. Discharged home per request.   Blenda Bridegroom, RN, BSN, Chiropodist

## 2014-04-17 NOTE — Progress Notes (Signed)
Reports during discussion "twisted ankle in the morning" when ambulated to bathroom and adamantly states "told everyone who came to room", cause of ankle pain. Neither the nurse tech who assisted with ambulation nor the RNs were told. Victorino Dike, the AD offered to call Neurosurgeon on call for notification of change in pt status prior to D/C. Pt states "Im ok" and son declined call to MD. Transferred out via wheelchair at 1918, at her baseline mentation. Son apologized to the CNA during transfer.

## 2014-05-24 IMAGING — RF DG MYELOGRAPHY LUMBAR INJ LUMBOSACRAL
12 of 17 series · 12 of 17 positions shown · non-contrast
Comparison: Previous CT myelogram and MRI.

CLINICAL DATA: Lumbar spine operation with persistent Back pain and
radiations to the right lower extremity.

EXAM:
LUMBAR MYELOGRAM
TECHNIQUE: Contiguous axial images were obtained through the Lumbar spine after
the intrathecal infusion of infusion. Coronal and sagittal
reconstructions were obtained of the axial image sets.

[Series 1: (hospital) · 1 of 1 slices shown]
[im 1/1]
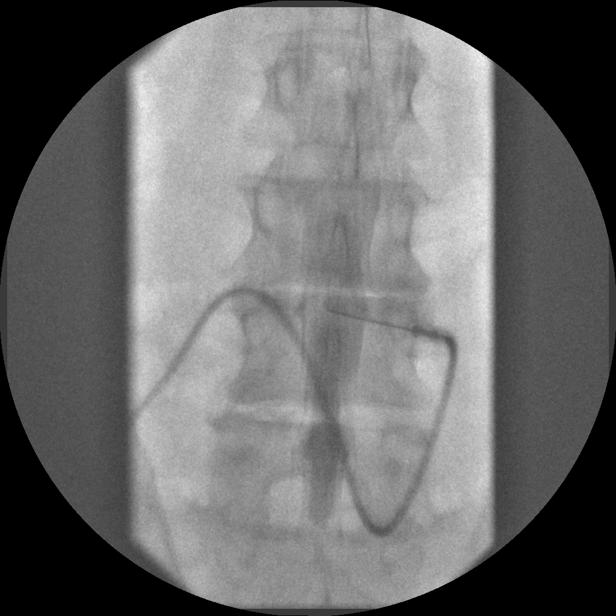

[Series 3: myelogram  white · 1 of 1 slices shown (1 of 8)]
[im 1/1]
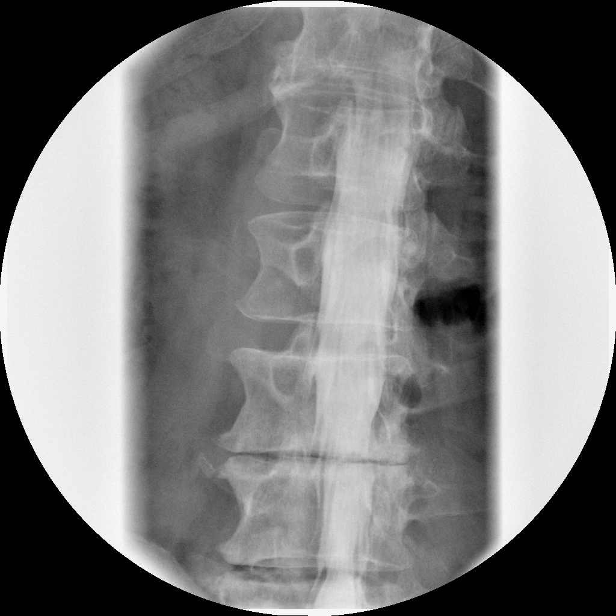

[Series 4: myelogram  white · 1 of 1 slices shown (2 of 8)]
[im 1/1]
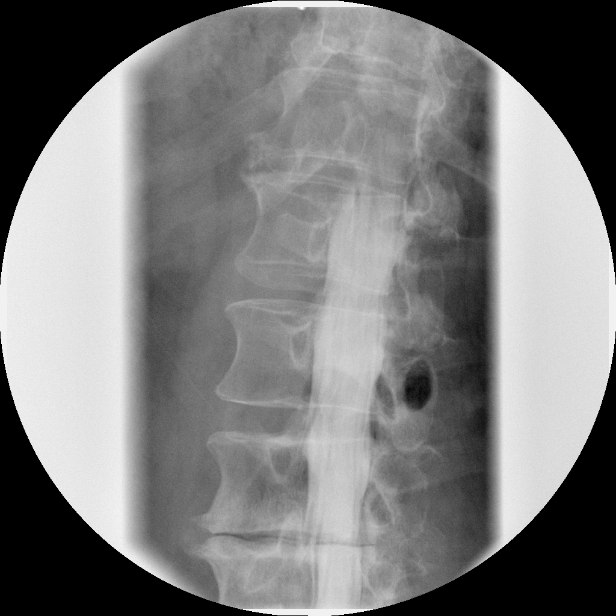

[Series 5: myelogram  white · 1 of 1 slices shown (3 of 8)]
[im 1/1]
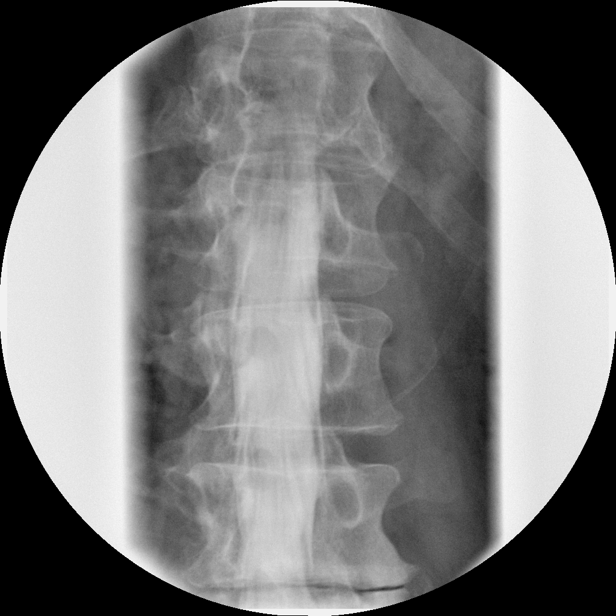

[Series 7: myelogram  white · 1 of 1 slices shown (4 of 8)]
[im 1/1]
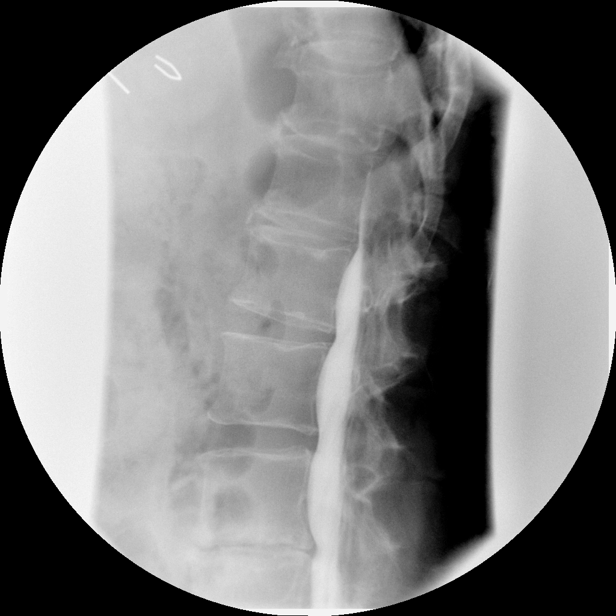

[Series 8: myelogram  white · 1 of 1 slices shown (5 of 8)]
[im 1/1]
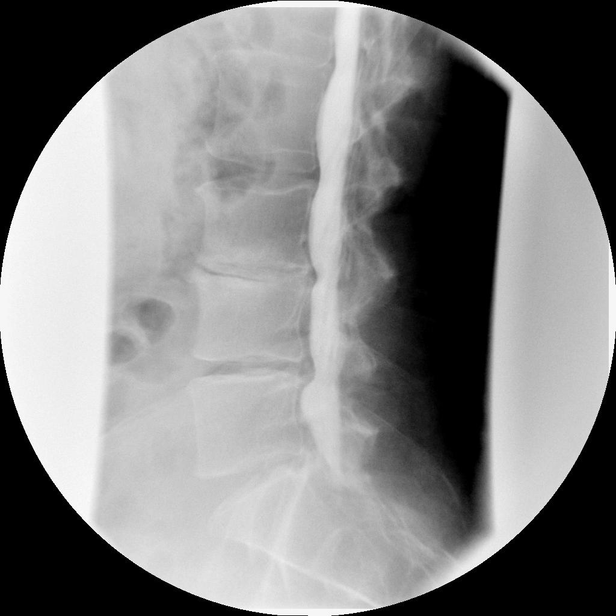

[Series 10: myelogram  white · 1 of 1 slices shown (6 of 8)]
[im 1/1]
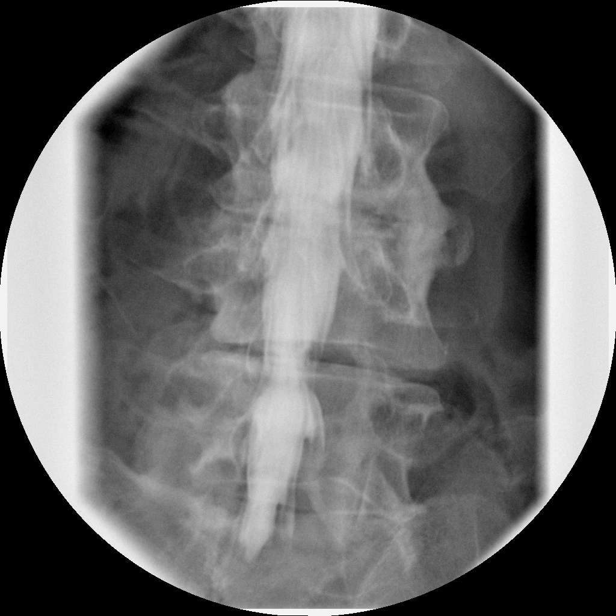

[Series 11: myelogram  white · 1 of 1 slices shown (7 of 8)]
[im 1/1]
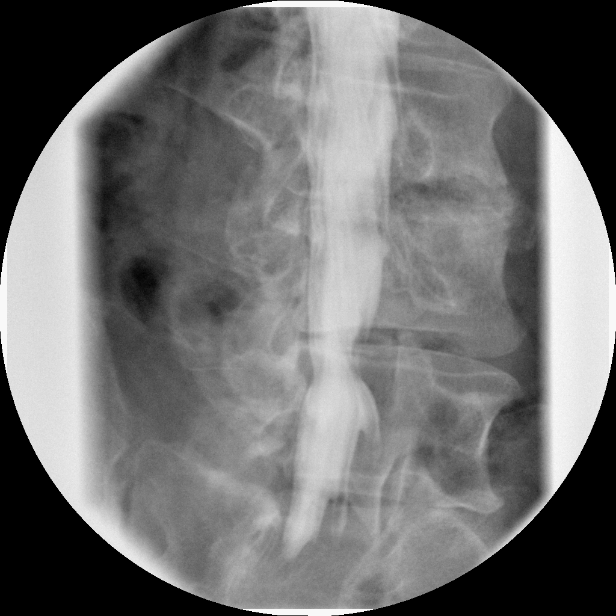

[Series 13: myelogram  white · 1 of 1 slices shown (8 of 8)]
[im 1/1]
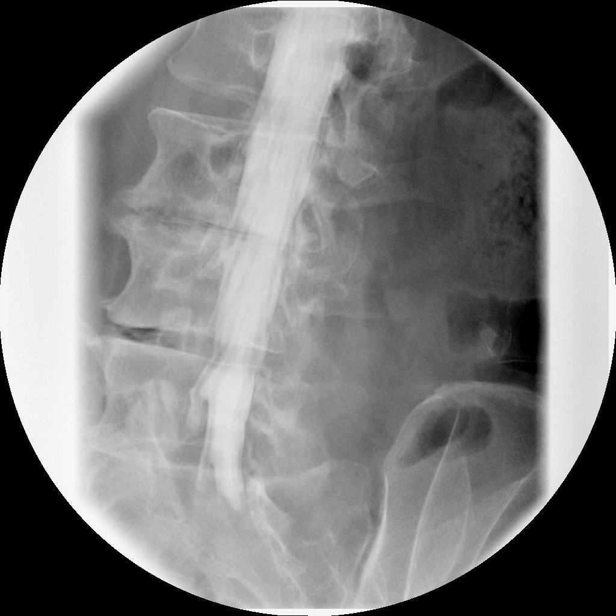

[Series 1001: view not recorded · 0.20mm/px · 1 of 1 slices shown (1 of 3)]
[im 1/1]
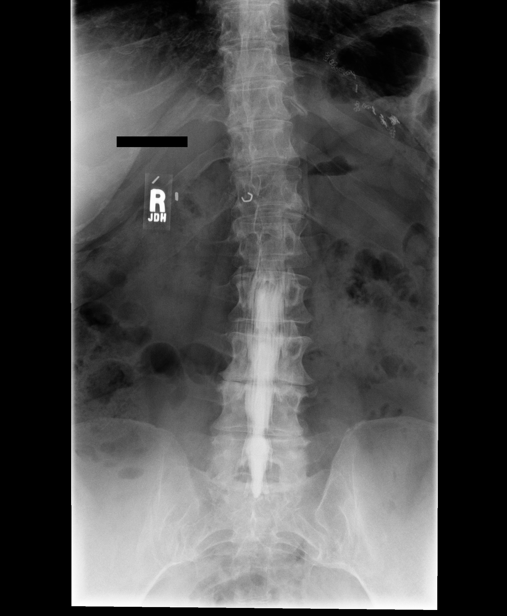

[Series 1002: view not recorded · 0.20mm/px · 1 of 1 slices shown (2 of 3)]
[im 1/1]
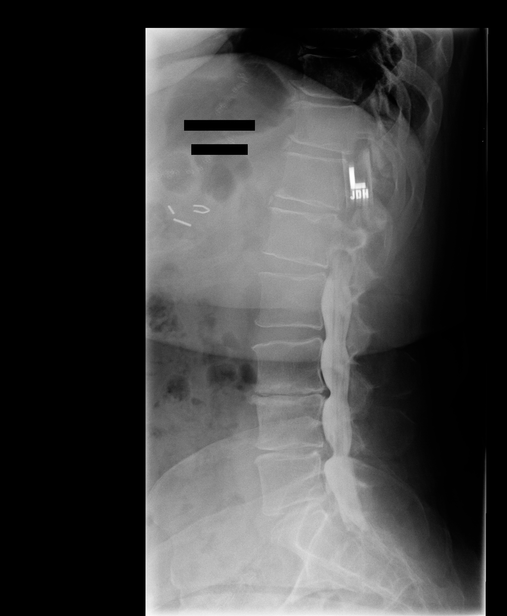

[Series 1004: view not recorded · 0.20mm/px · 1 of 1 slices shown (3 of 3)]
[im 1/1]
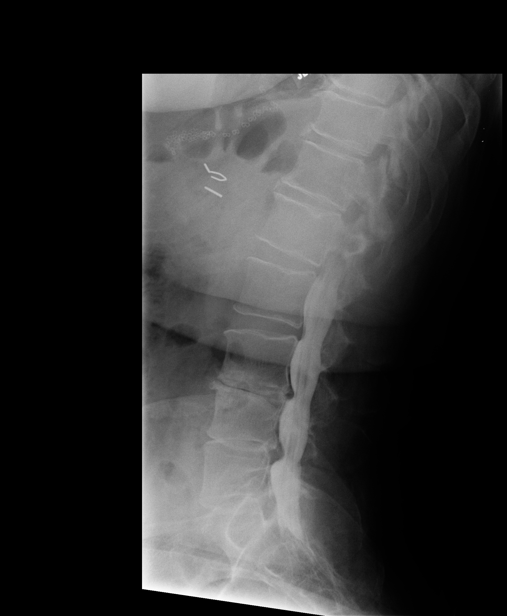

[12 of 17 positions shown; findings below may reference images not displayed]

FLUOROSCOPY TIME:  0 min 51 seconds

PROCEDURE:
After thorough discussion of risks and benefits of the procedure
including bleeding, infection, injury to nerves, blood vessels,
adjacent structures as well as headache and CSF leak, written and
oral informed consent was obtained. Consent was obtained by Dr.
Asuruma Amikko. Time out form was completed.

Patient was positioned prone on the fluoroscopy table. Local
anesthesia was provided with 1% lidocaine without epinephrine after
prepped and draped in the usual sterile fashion. Puncture was
performed at L3-L4 using a 3 1/2 inch 22-gauge spinal needle via
right paramedian approach. Using a single pass through the dura, the
needle was placed within the thecal sac, with return of clear CSF.
15 mL of 6mnipaque-17V was injected into the thecal sac, with normal
opacification of the nerve roots and cauda equina consistent with
free flow within the subarachnoid space.

I personally performed the lumbar puncture and administered the
intrathecal contrast. I also personally supervised acquisition of
the myelogram images.
FINDINGS: LUMBAR MYELOGRAM FINDINGS:

Plain film myelogram demonstrates severe L3-L4 and L4-L5 spondylosis
with collapse of the disc space, marginal osteophytes and endplate
sclerosis/degenerative changes. There is a small anterior extradural
impression at L3-L4 with the larger anterior extradural impression
at L4-L5. Small bulges are present at L1-L2 and L2-L3. The right
lateral recess at L4-L5 is stenotic, with medial deviation of the
descending right L5 nerve and mass effect on the right side of the
thecal sac at the level of the disc space.

On standing upright views, there is a mild levoconvex curve with the
apex at L3-L4. Anterior extradural impressions at L3-L4 and L4-L5
increase with standing position. There is a grade I retrolisthesis
of L4 on L5 that measures 3 mm in the neutral position and flexion
position and minimally increases to 4 mm with extension. The
anterior extradural impressions did not changed substantially
between flexion, extension and neutral position. Mass effect on the
lateral recesses is present bilaterally at L4-L5 in the standing
position although the it does appear somewhat greater on the left
than right at the level of the disc space, there is a gentle defect
that is larger on the right.

Cholecystectomy clips are present in the right upper quadrant.

CT LUMBAR MYELOGRAM FINDINGS:

Five lumbar type vertebral bodies are present. In the upright
stretched a in the supine position, 3 mm of grade I retrolisthesis
of L4 on L5 remains present. The paraspinal soft tissues show mild
visceral atherosclerosis. Vertebral body height is preserved.
Postoperative changes are present dorsal to L3-L4. Sclerotic
degenerative endplate inch ages are present at L3-L4. Scattered
Schmorl's nodes are present.

L1-L2: Annular calcification and mild facet degeneration. No
stenosis.

L1-L2: Mild facet arthrosis on the right. Normal appearance of the
disc for age.

L2-L3:  Normal disc.  Minimal left facet arthrosis.  No stenosis.

L3-L4: Severe disc degeneration with collapse of the disc space.
Vacuum disc. Endplate osteophytes are present posteriorly with a
shallow broad-based disc bulge. There is mild symmetric foraminal
stenosis which is unchanged compared to prior, associated with
endplate osteophytes. Minimal facet hypertrophy. Lateral recesses
appear adequately patent.

L4-L5: Interval right laminotomy. There is residual/ recurrent mild
central stenosis. The disc is degenerated with loss of height and
vacuum disc. Broad-based posterior annular bulging is present which
is partially calcified, best seen on sagittal imaging. There is mild
symmetric bilateral foraminal stenosis associated with collapse of
disc height and foraminal disc bulging.

There is a soft tissue density in the right lateral recess
suspicious for recurrent disc extrusion. On axial imaging, this
measures 9 mm AP x 14 mm transverse. There is posterior displacement
of the descending right L5 nerve as it approaches the lateral
recess. This soft tissue density has both cranial and caudal
extension be on the endplates.

L5-S1: Small central protrusion with annular calcification. Mild
facet hypertrophy/degeneration. The central canal and lateral
recesses are patent. Both neural foramina appear patent.
IMPRESSION: 1. Technically successful lumbar puncture for lumbar myelogram.
Lumbar puncture performed at L3-L4.
2. Interval right laminotomy at L4-L5. Small globular mass is
present in the right side of the central canal and right lateral
recess, suspicious for recurrent disc extrusion. Correlating with
prior MRI, recurrent disc protrusion with granulation tissue seems
the most likely. Mild recurrent central stenosis. Given the
patient's right lower extremity symptoms, compression or encasement
by scar tissue of the right L5 nerve in the lateral recess seems
likely.
3. Unchanged severe degenerative disease at L3-L4 with mild central
stenosis and mild symmetric foraminal stenosis.

## 2014-07-04 ENCOUNTER — Emergency Department: Payer: Self-pay | Admitting: Emergency Medicine

## 2014-08-05 NOTE — Consult Note (Signed)
54 y/o WFP consulted due to right leg pain, told by individual who did fott/ankle surgery that she had a Complex Regional Pain Syndrome and needed to have a nerve block done right away. Evaluation today reveals a radicular pain pattern, not stocking-like, no swelling, no temperature changes, no clor changes, and no hyperesthesia and allodynia, but rather numbness and weakness of the lower extremity. Based on the results, the patient was sent for a lumbar MRI, which revealde a new large, right sided disc extrusion at the L4-5 lateral recess, compressing the right L5 and extending across the midline into the left, possibly afecting the left L5 as well.  No Complex Regional Pain Syndrome.Acute Right sided L5 Lumbar Radiculopathy with numbness, weakness, and pain. Neurosurgery consult for decompression. [facility] you for your consult.  Pain Score. Pateint's son and admitting physician, both called and informed of findings and my recommendations.  Electronic Signatures: Odette FractionNaveira, Daliya Parchment Arturo (MD) (Signed on 18-Dec-15 19:01)  Authored   Last Updated: 18-Dec-15 19:26 by Odette FractionNaveira, Annelise Mccoy Arturo (MD)

## 2014-08-05 NOTE — Discharge Summary (Signed)
PATIENT NAME:  April Soto, April Soto MR#:  [id number] DATE OF BIRTH:  Jun 21, 1960  DATE OF ADMISSION:  03/30/2014 DATE OF DISCHARGE:  03/31/2014   TRANSFER SUMMARY    DISCHARGE DIAGNOSES:   1.  Acute on chronic back pain with right-sided L5 lumbar radiculopathy with numbness, weakness and pain.  2.  Hypertension.  3.  Obesity  4.  Osteoarthritis.  5.  Gastroesophageal reflux disease with esophagitis.   CONSULTS: Francisco A. Laban EmperorNaveira, MD   IMAGING STUDIES: Includes:   1.  A CT scan of the lumbar spine without contrast, which showed  L4-5 large right paracentral disk protrusion with mass effect on the right intraspinal nerve roots.   2.  Right femur x-ray showed osteoarthritis, nothing acute.  3.  Right lower extremity venous Doppler showed no DVT.  4.  MRI of the lumbar spine without contrast showed new large soft disk extrusion at L4-5 into the right lateral recess with protrusion extending across the midline into left lateral recess affecting the right L5 nerve and could affect the left L5 nerve. Resolution of the small disk protrusion to the right at L5-S1 found in the past.   ADMITTING HISTORY AND PHYSICAL: Please see detailed H and P dictated previously. In brief, a 54 year old female patient with a past history of obesity, fibromyalgia, hypertension presented to the hospital with right leg pain. The patient had this pain started initially at 6 weeks with significant worsening. Pain 10/10 intensity, was admitted to the hospitalist service for further consult with Dr. Laban EmperorNaveira with possible epidural injection.   HOSPITAL COURSE:  1.  Right leg pain and back pain. The patient was seen by Dr. Laban EmperorNaveira, and after getting an MRI of the lumbar spine without contrast showing L4-5 disk protrusion with nerve impingement and new changes, recommendation was made to have patient transferred to [facility] for neurosurgical intervention. I discussed the case with Dr. Jeral FruitBotero who suggested patient has seen Dr.  [name] of neurosurgery in the past, and the patient needs to be transferred onto hospitalist service with a neurosurgical consult. After which  case was discussed with Dr. Allena KatzPatel, [facility], who has accepted the patient in transfer.   The patient initially was adamant she did not agree to surgery. Later on, explaining that this was the only treatment for her pain control, she has agreed for transfer and surgery although she would like her son to be present when she gets transferred.   Here in the hospital the patient has been on Medrol, pain medications with IV Dilaudid, and immediate release oxycodone.   Last set of vitals showed temperature of 98, pulse of 90, respirations 20, blood pressure 142/88, saturating 94% on 2 liters oxygen.   Thank you for accepting the patient in transfer. Please call back with any questions.   TOTAL TIME SPENT IN COORDINATION OF CARE AND TRANSFER: 65 minutes.    ____________________________ Molinda BailiffSrikar R. Shanya Ferriss, MD srs:AT Soto: 03/31/2014 21:00:22 ET T: 04/01/2014 02:55:57 ET JOB#: [id number]  cc: Wardell HeathSrikar R. Tamieka Rancourt, MD, <Dictator> Francisco A. Laban EmperorNaveira, MD Margaretann LovelessNeelam S. Khan, MD [facility]  Orie FishermanSRIKAR R Salome Hautala MD ELECTRONICALLY SIGNED 04/02/2014 17:07

## 2014-08-05 NOTE — H&P (Signed)
PATIENT NAME:  April April Soto, April April Soto MR#:  [id number] DATE OF BIRTH:  1960/10/21  DATE OF ADMISSION:  03/30/2014  REFERRING PHYSICIAN: Kathreen DevoidKevin A. Paduchowski, MD  PRIMARY CARE PHYSICIAN: April LovelessNeelam S. Khan, MD; follows with the pain clinic with April April [name]   CHIEF COMPLAINT: Leg pain.   HISTORY OF PRESENT ILLNESS: A 54 year old Caucasian female with past medical history of obesity, fibromyalgia, hypertension presenting with right leg pain. States leg pain has been present for approximately 6 weeks in total, right-sided leg starting at her right buttock radiating down the lateral portion of upper leg into the medial calf. Denies any paresthesias or associated weakness. Describes her pain 10/10 in intensity, sharp, worse with walking and ambulation. Of note, does have a history of lumbar back surgery. Denies any further symptomatology. Denies any loss of bowel or bladder function.   REVIEW OF SYSTEMS:  CONSTITUTIONAL: Denies fevers, chills, fatigue.  EYES: Denies blurred vision, double vision, or eye pain.  EAR, NOSE, THROAT: Denies tinnitus, ear pain, or hearing loss. RESPIRATORY: Denies cough, wheeze, shortness of breath.  CARDIOVASCULAR: Denies chest pain, palpitations, edema. GASTROINTESTINAL: Denies nausea, vomiting, diarrhea, or abdominal pain.  GENITOURINARY: Denies dysuria or hematuria. ENDOCRINE: Denies nocturia or thyroid problems. HEMATOLOGIC AND LYMPHATIC: Denies easy bruising or bleeding. SKIN: Denies rash or lesions. MUSCULOSKELETAL: Positive for pain in her leg, as stated above. Denies pain in her neck or back, shoulder, knees. Positive for hip pain as described above. Denies further arthritic symptoms.  NEUROLOGIC: Denies any paralysis or paresthesias.  PSYCHIATRIC: Denies anxiety or depressive symptoms. Otherwise a full review of systems performed by me is negative.   PAST MEDICAL HISTORY: Fibromyalgia; depression, not otherwise specified; hypertension, essential; osteoarthritis of  multiple joints; hypothyroidism, unspecified; gastroesophageal reflux disease without esophagitis.   SOCIAL HISTORY: Denies any alcohol, tobacco, or drug usage.   FAMILY HISTORY: Denies any known cardiovascular or pulmonary disorders.   ALLERGIES: AMOXICILLIN.  HOME MEDICATIONS: Include: Acetaminophen and oxycodone 325/5 mg p.o. 1-3 times daily as needed for pain, diclofenac 75 mg p.o. b.i.April [name], oxycodone 5 mg p.o. q.6 hours, lorazepam 1 mg p.o. b.i.April [name], fluoxetine 40 mg p.o. daily, Crestor 10 mg p.o. daily, ProAir 90 mcg inhalation 2 puffs 4 times a day as needed for shortness of breath, Norvasc 10 mg p.o. daily, hydrochlorothiazide 25 mg p.o. daily, omeprazole 40 mg p.o. daily, levothyroxine 50 mcg p.o. daily.   PHYSICAL EXAMINATION:  VITAL SIGNS: Temperature 98.3, heart rate of 115, respirations 22, blood pressure 150/72, saturating 99% on room air. Weight 108.8 kg, BMI 38.7.  GENERAL: Obese Caucasian female in minimal to moderate distress given right-sided leg pain. She is actually just moaning in bed. HEAD: Normocephalic, atraumatic.  EYES: Pupils equal, round, and reactive to light. Extraocular muscles intact. No scleral icterus.  MOUTH: Moist mucous membranes. Dentition intact. No abscess noted.  EARS, NOSE, AND THROAT: Clear without exudate. No external lesions.  NECK: Supple. No thyromegaly. No nodules. No JVD.  PULMONARY: Clear to auscultation bilaterally without wheezes, rhonchi. No use of accessory muscle. Good respiratory effort. CHEST: Nontender to palpation.  CARDIOVASCULAR: S1, S2, regular rate and rhythm. No murmurs, rubs, or gallops. No edema. Pedal pulses 2+ bilaterally.  GASTROINTESTINAL: Soft, nontender, nondistended. No masses palpable. Positive bowel sounds. No hepatosplenomegaly. MUSCULOSKELETAL: No swelling, clubbing, or edema. Range of motion limited in proximal flexion of the right leg secondary to pain. Also, has pain passive rotation. Otherwise, range of motion full in  all extremities. Straight leg test eliciting pain at about 25 degrees on  right leg; relieved when leg is lowered again. NEUROLOGIC: Cranial nerves II-XII intact. States that she has sensation discrepancy of right lower extremity in comparison to left over the entire leg; however, no weakness is elicited at this time. Reflexes intact.  SKIN: No ulceration, lesions, rashes, or cyanosis. Skin warm, dry. Turgor intact.  PSYCHIATRIC: Mood, affect flattened.  She is awake, alert, oriented x 3. Insight and judgment intact.   LABORATORY AND RADIOGRAPHIC DATA: Had an ultrasound of her right leg negative for DVTs. Had x-ray of right leg performed, which reveals osteoarthritis of the right hip and knee.  Remainder of laboratory data: Sodium of 140, potassium 3.7, chloride 106, bicarbonate of 28, BUN 7, creatinine 0.63, glucose of 96. LFTs within normal limits. WBC of 6, hemoglobin 15.5, platelets of 174,000.   ASSESSMENT AND PLAN: A 54 year old Caucasian female with a history of obesity, fibromyalgia as well as lumbar surgery in the past presenting with right leg pain.  1.  Right leg pain in the lateral portion of the leg: most likely sciatica given positive straight leg test as well as remainder of exam. Provide pain control as well as initiate bowel regimen. She may require a lumbar MRI if other treatment options are considered including surgical options; however, no weakness at this time, low urgent need for MRI. We will consult pain management, as she does follow with Dr. Laban Emperor, who is supposed to see her for the above symptoms and planning on nerve blockade.  2.  Hypertension. Norvasc.  3.  Depression. Fluoxetine. 4.  Hypothyroidism. Synthroid.  5.  Gastroesophageal reflux disease. Proton pump inhibitor therapy.  6.  Hyperlipidemia, unspecified type. Statin therapy. 7.  Venous thromboembolism prophylaxis. Heparin subcutaneous.   CODE STATUS: Patient is full code.  TIME SPENT: 35 minutes.     ____________________________ April April [name] April Naser, MD dkh:bm April Soto: 03/30/2014 23:35:41 ET T: 03/31/2014 00:09:13 ET JOB#: [id number]  cc: April April [name] Krista Godsil, MD, <Dictator> Dani Wallner Synetta Shadow MD ELECTRONICALLY SIGNED 03/31/2014 2:16

## 2014-08-09 NOTE — Discharge Summary (Signed)
PATIENT NAME:  April Soto, [name] MR#:  [id number] DATE OF BIRTH:  October 23, 1960  DATE OF ADMISSION:  03/30/2014 DATE OF DISCHARGE:  04/01/2014   ADDENDUM:  This is an addendum to the discharge summary dictated by Dr. Milagros LollSrikar Sudini.  ADMITTING PHYSICIAN: Cletis AthensDavid K. Hower, MD  DISCHARGING PHYSICIAN: Enid Baasadhika Kalisetti, MD  CONSULTATIONS IN THE HOSPITAL: [facility], in the hospital. Again, for details, look at the discharge summary dictated by Dr. Milagros LollSrikar Sudini.   HOSPITAL COURSE: In brief, [name] is a 54 year old lady with a known history of low back pain status post back surgery in the past, hypertension, arthritis, hypothyroidism, admitted to the hospital secondary to worsening right back pain shooting down her leg into the foot and has also numbness of the foot. Seen by pain management physician. MRI of the lumbar spine ordered shows soft disk extrusion at the L4-5 level with nerve pressure effect seen on L5, and patient with weakness on the leg. She was started on a Medrol Dosepak here in the hospital with some improvement in the pain and mobility. Neurosurgery consultation was recommended.   [facility], and [facility]. The patient is very anxious about this, and has multiple somatic complaints, since the transfer has been told to the patient. She has some itching, burning. No rash noted. No new medications other than Medrol Dosepak; however, since it is helping, we will continue it for now, and adjust it if we have to. She is already on Ativan, which she takes at home.  ADDITIONAL TIME SPENT: 35 minutes.   ____________________________ Charlton AmorHillary N. Colie Josten, MD hnc:MT D: 04/01/2014 13:31:40 ET T: 04/01/2014 13:52:23 ET JOB#: [id number]  cc: Charlton AmorHillary N. Celines Femia, MD, <Dictator>

## 2014-08-09 NOTE — Discharge Summary (Signed)
PATIENT NAME:  April Soto, April Soto MR#:  [id number] DATE OF BIRTH:  Aug 23, 1960  DATE OF ADMISSION:  03/30/2014 DATE OF DISCHARGE:  04/01/2014  ADDENDUM:    The patient was supposed to be transferred to [facility] for neurosurgery evaluation of her lumbar disk protrusion at the L4-L5 level with some weakness in the right foot. She refused the transfer the day prior saying that her family needed to be there for her transfer; however, she got anxious, felt better on Medrol Dosepak, was able to ambulate and left the hospital against medical advice. She said she will [facility] if she has to.    ____________________________ Enid Baasadhika Gaylen Pereira, MD rk:kl Soto: 04/04/2014 16:46:23 ET T: 04/04/2014 19:45:57 ET JOB#: [id number]  cc: Enid Baasadhika Wassim Kirksey, MD, <Dictator> Enid BaasADHIKA Juanita Streight MD ELECTRONICALLY SIGNED 04/18/2014 14:49

## 2014-08-13 NOTE — Discharge Summary (Signed)
PATIENT NAME:  April Soto, [name] MR#:  [id number] DATE OF BIRTH:  November 07, 1960  DATE OF ADMISSION:  03/30/2014 DATE OF DISCHARGE:  04/01/2014   ADDENDUM:  This is an addendum to the discharge summary dictated by Dr. Milagros LollSrikar Sudini.  ADMITTING PHYSICIAN: Cletis AthensDavid K. Hower, MD  DISCHARGING PHYSICIAN: Enid Baasadhika Jhordyn Hoopingarner, MD  CONSULTATIONS IN THE HOSPITAL: Pain management [facility], in the hospital. Again, for details, look at the discharge summary dictated by Dr. Milagros LollSrikar Sudini.   HOSPITAL COURSE: In brief, [name] is a 54 year old lady with a known history of low back pain status post back surgery in the past, hypertension, arthritis, hypothyroidism, admitted to the hospital secondary to worsening right back pain shooting down her leg into the foot and has also numbness of the foot. Seen by pain management physician. MRI of the lumbar spine ordered shows soft disk extrusion at the L4-5 level with nerve pressure effect seen on L5, and patient with weakness on the leg. She was started on a Medrol Dosepak here in the hospital with some improvement in the pain and mobility. Neurosurgery consultation was recommended.   The [facility], and [facility]. The patient is very anxious about this, and has multiple somatic complaints, since the transfer has been told to the patient. She has some itching, burning. No rash noted. No new medications other than Medrol Dosepak; however, since it is helping, we will continue it for now, and adjust it if we have to. She is already on Ativan, which she takes at home.  ADDITIONAL TIME SPENT: 35 minutes.  ____________________________ Enid Baasadhika Jametta Moorehead, MD rk:MT D: 04/01/2014 13:31:00 ET T: 04/01/2014 13:52:23 ET JOB#: [id number]  cc: Enid Baasadhika Odean Fester, MD, <Dictator> Enid BaasADHIKA Marcelle Hepner MD ELECTRONICALLY SIGNED 05/01/2014 16:05

## 2014-10-05 ENCOUNTER — Other Ambulatory Visit: Payer: Self-pay | Admitting: Nurse Practitioner

## 2014-10-05 DIAGNOSIS — Z1231 Encounter for screening mammogram for malignant neoplasm of breast: Secondary | ICD-10-CM

## 2014-11-10 ENCOUNTER — Ambulatory Visit: Payer: Medicare Other

## 2014-11-17 ENCOUNTER — Ambulatory Visit: Payer: Medicare Other

## 2014-11-22 ENCOUNTER — Ambulatory Visit: Admission: RE | Admit: 2014-11-22 | Payer: Medicare Other | Source: Ambulatory Visit

## 2014-11-30 ENCOUNTER — Ambulatory Visit: Payer: Medicare Other | Admitting: Dietician

## 2014-12-05 ENCOUNTER — Ambulatory Visit: Payer: Medicare Other | Admitting: Dietician

## 2015-01-30 DIAGNOSIS — E041 Nontoxic single thyroid nodule: Secondary | ICD-10-CM | POA: Insufficient documentation

## 2015-02-08 ENCOUNTER — Other Ambulatory Visit: Payer: Self-pay | Admitting: Nurse Practitioner

## 2015-02-08 DIAGNOSIS — Z1239 Encounter for other screening for malignant neoplasm of breast: Secondary | ICD-10-CM

## 2015-02-20 ENCOUNTER — Ambulatory Visit: Payer: Medicare Other

## 2015-03-01 ENCOUNTER — Ambulatory Visit
Admission: RE | Admit: 2015-03-01 | Discharge: 2015-03-01 | Disposition: A | Discharge status: Home / Self Care | Payer: Medicare Other | Source: Ambulatory Visit | Attending: Nurse Practitioner | Admitting: Nurse Practitioner

## 2015-03-01 DIAGNOSIS — Z1239 Encounter for other screening for malignant neoplasm of breast: Secondary | ICD-10-CM

## 2015-03-01 DIAGNOSIS — Z1231 Encounter for screening mammogram for malignant neoplasm of breast: Secondary | ICD-10-CM | POA: Diagnosis present

## 2015-11-27 DIAGNOSIS — D751 Secondary polycythemia: Secondary | ICD-10-CM | POA: Insufficient documentation

## 2015-11-28 ENCOUNTER — Encounter (INDEPENDENT_AMBULATORY_CARE_PROVIDER_SITE_OTHER): Payer: Self-pay

## 2015-11-28 ENCOUNTER — Inpatient Hospital Stay: Payer: Medicare Other | Attending: Oncology | Admitting: Oncology

## 2015-11-28 ENCOUNTER — Inpatient Hospital Stay: Payer: Medicare Other

## 2015-11-28 ENCOUNTER — Encounter: Payer: Self-pay | Admitting: Oncology

## 2015-11-28 VITALS — BP 125/83 | HR 91 | Temp 98.3°F | Wt 219.5 lb

## 2015-11-28 DIAGNOSIS — R634 Abnormal weight loss: Secondary | ICD-10-CM | POA: Diagnosis not present

## 2015-11-28 DIAGNOSIS — I10 Essential (primary) hypertension: Secondary | ICD-10-CM | POA: Insufficient documentation

## 2015-11-28 DIAGNOSIS — K219 Gastro-esophageal reflux disease without esophagitis: Secondary | ICD-10-CM | POA: Insufficient documentation

## 2015-11-28 DIAGNOSIS — Z79899 Other long term (current) drug therapy: Secondary | ICD-10-CM | POA: Insufficient documentation

## 2015-11-28 DIAGNOSIS — E785 Hyperlipidemia, unspecified: Secondary | ICD-10-CM | POA: Diagnosis not present

## 2015-11-28 DIAGNOSIS — M1990 Unspecified osteoarthritis, unspecified site: Secondary | ICD-10-CM | POA: Diagnosis not present

## 2015-11-28 DIAGNOSIS — D751 Secondary polycythemia: Secondary | ICD-10-CM | POA: Insufficient documentation

## 2015-11-28 DIAGNOSIS — F17210 Nicotine dependence, cigarettes, uncomplicated: Secondary | ICD-10-CM | POA: Diagnosis not present

## 2015-11-28 DIAGNOSIS — R1012 Left upper quadrant pain: Secondary | ICD-10-CM | POA: Insufficient documentation

## 2015-11-28 LAB — CBC
HEMATOCRIT: 47.6 % — AB (ref 35.0–47.0)
HEMOGLOBIN: 16.3 g/dL — AB (ref 12.0–16.0)
MCH: 31.7 pg (ref 26.0–34.0)
MCHC: 34.2 g/dL (ref 32.0–36.0)
MCV: 92.7 fL (ref 80.0–100.0)
Platelets: 185 10*3/uL (ref 150–440)
RBC: 5.14 MIL/uL (ref 3.80–5.20)
RDW: 13.3 % (ref 11.5–14.5)
WBC: 9.2 10*3/uL (ref 3.6–11.0)

## 2015-11-28 LAB — IRON AND TIBC
IRON: 69 ug/dL (ref 28–170)
Saturation Ratios: 23 % (ref 10.4–31.8)
TIBC: 296 ug/dL (ref 250–450)
UIBC: 227 ug/dL

## 2015-11-28 LAB — FERRITIN: FERRITIN: 71 ng/mL (ref 11–307)

## 2015-11-28 NOTE — Progress Notes (Signed)
Patient ambulates via wheelchair, brought to exam room 9, accompanied by her husband.  Patient c/o chronic lower back pain 4 oof 10  and right chronic foot pain 5 oof 10 Patient also states she's lost 27lbs over the last 3 months.   BP 125/83 HR 91  Medication record updated, information provided by patient.

## 2015-11-28 NOTE — Progress Notes (Signed)
[facility]  Telephone:(336) [phone] [phone]  ID: April CordsSandra D Soto OB: 12/01/1960  MR#: [id number]  AOZ#:[id number]#:[id number]  Patient Care Team: Margaretann LovelessNeelam S Khan, MD as PCP - General (Internal Medicine)  CHIEF COMPLAINT: Polycythemia, weight loss, left upper quadrant abdominal pain.  INTERVAL HISTORY: Patient is a 55 year old female who was noted to have increasing hemoglobin over the past approximately 6 months. She also reports a 27 pound weight loss over the past 2-3 months. Finally, patient has had persistent left upper quadrant pain. She has occasional shortness of breath and endorses weakness and fatigue. She has no neurologic complaints. She denies any recent fevers or illnesses. She denies any chest pain, hemoptysis, or cough. She denies any nausea, vomiting, constipation, or diarrhea. She has no urinary complaints. Patient feels generally terrible, but offers no further specific complaints.  REVIEW OF SYSTEMS:   Review of Systems  Constitutional: Positive for malaise/fatigue and weight loss. Negative for fever.  Respiratory: Positive for shortness of breath.   Cardiovascular: Negative.  Negative for chest pain.  Gastrointestinal: Positive for abdominal pain. Negative for blood in stool, constipation, diarrhea, melena, nausea and vomiting.  Genitourinary: Negative.   Musculoskeletal: Negative.   Neurological: Positive for weakness.  Psychiatric/Behavioral: The patient is nervous/anxious.     As per HPI. Otherwise, a complete review of systems is negatve.  PAST MEDICAL HISTORY: Past Medical History:  Diagnosis Date  . Anemia    "years ago"  . Anxiety    takes Ativan daily  . Bipolar 1 disorder (HCC)   . Chronic back pain    HNP  . Constipation    takes Ra [employer] daily  . Degenerative disc disease   . Depression    was on Prozac but taken off by MD for a couple of weeks  . Fibromyalgia    takes Lyrica daily  . Genital herpes   . GERD  (gastroesophageal reflux disease)    takes Nexium daily  . Headache(784.0)   . Heart murmur    "grew out of"  . History of bronchitis    last time 15+yrs ago  . History of rheumatic fever   . Hyperlipidemia    takes Simvastatin daily  . Hypertension    takes Coreg daily  . Hypothyroidism    takes Synthroid daily  . Joint pain   . Muscle spasm    takes Flexeril daily as needed  . Nocturia   . Osteoarthritis   . Peripheral edema    takes HCTZ daily  . Pneumonia    hx of;last time 30+yrs ago  . Raynaud's disease   . Rheumatic fever    as a child  . Seasonal allergies    takes Zyrtec daily and uses Dymista as well  . Stroke [facility](HCC)    ? 5 years ago. No lasting deficits  . Urinary incontinence   . Weakness    nmbness and tingling-right leg    PAST SURGICAL HISTORY: Past Surgical History:  Procedure Laterality Date  . ABDOMINAL HYSTERECTOMY    . CHOLECYSTECTOMY    . COLONOSCOPY    . ESOPHAGOGASTRODUODENOSCOPY    . FOOT SURGERY Right 12/2013   ankle, heel and top of foot  . LUMBAR LAMINECTOMY/DECOMPRESSION MICRODISCECTOMY Right 01/14/2013   Procedure: RIGHT LUMBAR FOUR FIVE  LAMINECTOMY/DECOMPRESSION MICRODISCECTOMY 1 LEVEL;  Surgeon: Reinaldo Meekerandy O Kritzer, MD;  Location: [facility];  Service: Neurosurgery;  Laterality: Right;  RIGHT L45 microdiskectomy  . OTHER SURGICAL HISTORY     Gastric stapling  for weight loss. patient reports done in 1980's  . SEPTOPLASTY    . TONSILLECTOMY    . TUBAL LIGATION      FAMILY HISTORY: Family History  Problem Relation Age of Onset  . Diabetes type II Mother   . Hypertension Mother   . Diabetes Mother   . Diabetes Sister   . Hypertension Sister   . Hypertension Maternal Grandmother   . Diabetes Maternal Grandmother        ADVANCED DIRECTIVES:    HEALTH MAINTENANCE: Social History  Substance Use Topics  . Smoking status: Current Some Day Smoker    Packs/day: 0.50    Years: 40.00    Types: Cigarettes  . Smokeless tobacco:  Never Used     Comment: trying to quit as of 04/10/14  . Alcohol use No     Colonoscopy:  PAP:  Bone density:  Lipid panel:  Allergies  Allergen Reactions  . Celebrex [Celecoxib] Hives, Itching and Other (See Comments)  . Amoxicillin Itching and Other (See Comments)    depression    Current Outpatient Prescriptions  Medication Sig Dispense Refill  . acyclovir (ZOVIRAX) 400 MG tablet Take 400 mg by mouth at bedtime.     Marland Kitchen. albuterol (PROAIR HFA) 108 (90 BASE) MCG/ACT inhaler Inhale 2 puffs into the lungs every 4 (four) hours as needed for wheezing or shortness of breath.     Marland Kitchen. amLODipine (NORVASC) 10 MG tablet Take 10 mg by mouth at bedtime.     . cetirizine (ZYRTEC) 10 MG tablet Take 10 mg by mouth at bedtime as needed for allergies.     . cyclobenzaprine (FLEXERIL) 10 MG tablet Take 1 tablet (10 mg total) by mouth 3 (three) times daily as needed for muscle spasms. 50 tablet 2  . esomeprazole (NEXIUM) 40 MG capsule Take 40 mg by mouth at bedtime as needed (acid reflux).     Marland Kitchen. FLUoxetine (PROZAC) 20 MG tablet Take 20 mg by mouth 3 (three) times daily.     . fluticasone (FLONASE) 50 MCG/ACT nasal spray Place 1 spray into both nostrils at bedtime.     . hydrocortisone (ANUSOL-HC) 2.5 % rectal cream Apply 2.5 application topically.    Marland Kitchen. HYDROmorphone (DILAUDID) 4 MG tablet Take 1 tablet (4 mg total) by mouth every 4 (four) hours as needed for severe pain. 65 tablet 0  . levothyroxine (SYNTHROID, LEVOTHROID) 88 MCG tablet Take 88 mcg by mouth daily before breakfast.   0  . LORazepam (ATIVAN) 1 MG tablet Take 1 mg by mouth 3 (three) times daily.    . Olopatadine HCl (PATADAY) 0.2 % SOLN Place 1 drop into both eyes at bedtime as needed (allergies).    Marland Kitchen. OVER THE COUNTER MEDICATION 1 capsule as needed (daily as needed).    . OxyCODONE (OXYCONTIN) 10 mg T12A 12 hr tablet Take 1 tablet (10 mg total) by mouth every 12 (twelve) hours. 20 tablet 0  . pregabalin (LYRICA) 150 MG capsule Take 150 mg  by mouth 3 (three) times daily.     No current facility-administered medications for this visit.     OBJECTIVE: Vitals:   11/28/15 1205  BP: 125/83  Pulse: 91  Temp: 98.3 F (36.8 C)     Body mass index is 35.42 kg/m.    ECOG FS:0 - Asymptomatic  General: Well-developed, well-nourished, no acute distress. Eyes: Pink conjunctiva, anicteric sclera. HEENT: Normocephalic, moist mucous membranes, clear oropharnyx. Lungs: Clear to auscultation bilaterally. Heart: Regular rate and rhythm. No  rubs, murmurs, or gallops. Abdomen: Soft, nontender, nondistended. No organomegaly noted, normoactive bowel sounds. Musculoskeletal: No edema, cyanosis, or clubbing. Neuro: Alert, answering all questions appropriately. Cranial nerves grossly intact. Skin: No rashes or petechiae noted. Psych: Normal affect. Lymphatics: No cervical, calvicular, axillary or inguinal LAD.   LAB RESULTS:  Lab Results  Component Value Date   NA 135 04/05/2014   K 3.6 04/05/2014   CL 100 04/05/2014   CO2 26 04/05/2014   GLUCOSE 112 (H) 04/05/2014   BUN 10 04/05/2014   CREATININE 0.61 04/05/2014   CALCIUM 8.3 (L) 04/05/2014   PROT 5.4 (L) 04/05/2014   ALBUMIN 3.1 (L) 04/05/2014   AST 30 04/05/2014   ALT 24 04/05/2014   ALKPHOS 57 04/05/2014   BILITOT 0.3 04/05/2014   GFRNONAA >90 04/05/2014   GFRAA >90 04/05/2014    Lab Results  Component Value Date   WBC 9.2 11/28/2015   NEUTROABS 3.8 04/05/2014   HGB 16.3 (H) 11/28/2015   HCT 47.6 (H) 11/28/2015   MCV 92.7 11/28/2015   PLT 185 11/28/2015     STUDIES: No results found.  ASSESSMENT: Polycythemia, weight loss, left upper quadrant abdominal pain  PLAN:    1. Polycythemia: Unclear etiology, possibly secondary to patient's tobacco use. Her hemoglobin has been steadily rising since ~October 2016 and is currently at 16.3. Iron stores, erythropoietin levels, carbon monoxide, and JAK-2 mutation are all pending at time of dictation. Return to clinic in  2 weeks for discussion of her laboratory results and further evaluation. 2. Weight loss: Monitor. Can consider CT scans if no distinct etiology is determined. 3. Left upper quadrant pain: Will get abdominal ultrasound to further evaluate. Follow-up as above.  Patient expressed understanding and was in agreement with this plan. She also understands that She can call clinic at any time with any questions, concerns, or complaints.    Jeralyn Ruths, MD   11/28/2015 1:33 PM

## 2015-11-29 LAB — ERYTHROPOIETIN: ERYTHROPOIETIN: 15.1 m[IU]/mL (ref 2.6–18.5)

## 2015-11-29 LAB — CARBON MONOXIDE, BLOOD (PERFORMED AT REF LAB): Carbon Monoxide, Blood: 7.1 % — ABNORMAL HIGH (ref 0.0–3.6)

## 2015-12-03 LAB — JAK2 GENOTYPR

## 2015-12-07 ENCOUNTER — Ambulatory Visit
Admission: RE | Admit: 2015-12-07 | Discharge: 2015-12-07 | Disposition: A | Discharge status: Home / Self Care | Payer: Medicare Other | Source: Ambulatory Visit | Attending: Oncology | Admitting: Oncology

## 2015-12-07 DIAGNOSIS — R1012 Left upper quadrant pain: Secondary | ICD-10-CM | POA: Diagnosis present

## 2015-12-11 NOTE — Progress Notes (Signed)
[facility]  Telephone:(336) [phone] [phone]) [phone]  ID: Kathrine CordsSandra D Ulrey OB: 04/18/1960  MR#: [id number]  GEX#:[id number]#:[id number]  Patient Care Team: Margaretann LovelessNeelam S Khan, MD as PCP - General (Internal Medicine)  CHIEF COMPLAINT: Secondary polycythemia.  INTERVAL HISTORY: Patient returns to clinic today for further evaluation and discussion of her laboratory work. She continues to have mild abdominal pain, but does not complain of any further weight loss. She continues to have occasional shortness of breath as well as weakness and fatigue. She has no neurologic complaints. She denies any recent fevers or illnesses. She denies any chest pain, hemoptysis, or cough. She denies any nausea, vomiting, constipation, or diarrhea. She has no urinary complaints. Patient offers no further specific complaints today.  REVIEW OF SYSTEMS:   Review of Systems  Constitutional: Positive for malaise/fatigue. Negative for fever and weight loss.  Respiratory: Positive for shortness of breath.   Cardiovascular: Negative.  Negative for chest pain.  Gastrointestinal: Positive for abdominal pain. Negative for blood in stool, constipation, diarrhea, melena, nausea and vomiting.  Genitourinary: Negative.   Musculoskeletal: Negative.   Neurological: Positive for weakness.  Psychiatric/Behavioral: The patient is nervous/anxious.     As per HPI. Otherwise, a complete review of systems is negatve.  PAST MEDICAL HISTORY: Past Medical History:  Diagnosis Date  . Anemia    "years ago"  . Anxiety    takes Ativan daily  . Bipolar 1 disorder (HCC)   . Chronic back pain    HNP  . Constipation    takes Ra [employer] daily  . Degenerative disc disease   . Depression    was on Prozac but taken off by MD for a couple of weeks  . Fibromyalgia    takes Lyrica daily  . Genital herpes   . GERD (gastroesophageal reflux disease)    takes Nexium daily  . Headache(784.0)   . Heart murmur    "grew out of"  .  History of bronchitis    last time 15+yrs ago  . History of rheumatic fever   . Hyperlipidemia    takes Simvastatin daily  . Hypertension    takes Coreg daily  . Hypothyroidism    takes Synthroid daily  . Joint pain   . Muscle spasm    takes Flexeril daily as needed  . Nocturia   . Osteoarthritis   . Peripheral edema    takes HCTZ daily  . Pneumonia    hx of;last time 30+yrs ago  . Raynaud's disease   . Rheumatic fever    as a child  . Seasonal allergies    takes Zyrtec daily and uses Dymista as well  . Stroke [facility](HCC)    ? 5 years ago. No lasting deficits  . Urinary incontinence   . Weakness    nmbness and tingling-right leg    PAST SURGICAL HISTORY: Past Surgical History:  Procedure Laterality Date  . ABDOMINAL HYSTERECTOMY    . CHOLECYSTECTOMY    . COLONOSCOPY    . ESOPHAGOGASTRODUODENOSCOPY    . FOOT SURGERY Right 12/2013   ankle, heel and top of foot  . LUMBAR LAMINECTOMY/DECOMPRESSION MICRODISCECTOMY Right 01/14/2013   Procedure: RIGHT LUMBAR FOUR FIVE  LAMINECTOMY/DECOMPRESSION MICRODISCECTOMY 1 LEVEL;  Surgeon: Reinaldo Meekerandy O Kritzer, MD;  Location: [facility] NEURO ORS;  Service: Neurosurgery;  Laterality: Right;  RIGHT L45 microdiskectomy  . OTHER SURGICAL HISTORY     Gastric stapling for weight loss. patient reports done in 1980's  . SEPTOPLASTY    . TONSILLECTOMY    .  TUBAL LIGATION      FAMILY HISTORY: Family History  Problem Relation Age of Onset  . Diabetes type II Mother   . Hypertension Mother   . Diabetes Mother   . Diabetes Sister   . Hypertension Sister   . Hypertension Maternal Grandmother   . Diabetes Maternal Grandmother        ADVANCED DIRECTIVES:    HEALTH MAINTENANCE: Social History  Substance Use Topics  . Smoking status: Current Some Day Smoker    Packs/day: 0.50    Years: 40.00    Types: Cigarettes  . Smokeless tobacco: Never Used     Comment: trying to quit as of 04/10/14  . Alcohol use No     Colonoscopy:  PAP:  Bone  density:  Lipid panel:  Allergies  Allergen Reactions  . Celebrex [Celecoxib] Hives, Itching and Other (See Comments)  . Amoxicillin Itching and Other (See Comments)    depression    Current Outpatient Prescriptions  Medication Sig Dispense Refill  . acyclovir (ZOVIRAX) 400 MG tablet Take 400 mg by mouth at bedtime.     Marland Kitchen albuterol (PROAIR HFA) 108 (90 BASE) MCG/ACT inhaler Inhale 2 puffs into the lungs every 4 (four) hours as needed for wheezing or shortness of breath.     Marland Kitchen amLODipine (NORVASC) 10 MG tablet Take 10 mg by mouth at bedtime.     . cetirizine (ZYRTEC) 10 MG tablet Take 10 mg by mouth at bedtime as needed for allergies.     Marland Kitchen esomeprazole (NEXIUM) 40 MG capsule Take 40 mg by mouth at bedtime as needed (acid reflux).     . fluticasone (FLONASE) 50 MCG/ACT nasal spray Place 1 spray into both nostrils at bedtime.     . hydrocortisone (ANUSOL-HC) 2.5 % rectal cream Apply 2.5 application topically as needed.     Marland Kitchen levothyroxine (SYNTHROID, LEVOTHROID) 88 MCG tablet Take 88 mcg by mouth daily before breakfast.   0  . LORazepam (ATIVAN) 1 MG tablet Take 1 mg by mouth 3 (three) times daily.    . Morphine-Naltrexone (EMBEDA) 30-1.2 MG CPCR Take 1 capsule by mouth 2 (two) times daily.    . Olopatadine HCl (PATADAY) 0.2 % SOLN Place 1 drop into both eyes at bedtime as needed (allergies).    Marland Kitchen OVER THE COUNTER MEDICATION 1 capsule as needed (daily as needed).    Marland Kitchen oxyCODONE-acetaminophen (PERCOCET) 10-325 MG tablet Take 1 tablet by mouth every 6 (six) hours as needed for pain.    . pregabalin (LYRICA) 150 MG capsule Take 150 mg by mouth 3 (three) times daily.     No current facility-administered medications for this visit.     OBJECTIVE: Vitals:   12/12/15 1613  BP: 122/88  Pulse: 75  Resp: 18  Temp: 98.9 F (37.2 C)     Body mass index is 35.14 kg/m.    ECOG FS:0 - Asymptomatic  General: Well-developed, well-nourished, no acute distress. Eyes: Pink conjunctiva, anicteric  sclera. Lungs: Clear to auscultation bilaterally. Heart: Regular rate and rhythm. No rubs, murmurs, or gallops. Abdomen: Soft, nontender, nondistended. No organomegaly noted, normoactive bowel sounds. Musculoskeletal: No edema, cyanosis, or clubbing. Neuro: Alert, answering all questions appropriately. Cranial nerves grossly intact. Skin: No rashes or petechiae noted. Psych: Normal affect.   LAB RESULTS:  Lab Results  Component Value Date   NA 135 04/05/2014   K 3.6 04/05/2014   CL 100 04/05/2014   CO2 26 04/05/2014   GLUCOSE 112 (H) 04/05/2014   BUN  10 04/05/2014   CREATININE 0.61 04/05/2014   CALCIUM 8.3 (L) 04/05/2014   PROT 5.4 (L) 04/05/2014   ALBUMIN 3.1 (L) 04/05/2014   AST 30 04/05/2014   ALT 24 04/05/2014   ALKPHOS 57 04/05/2014   BILITOT 0.3 04/05/2014   GFRNONAA >90 04/05/2014   GFRAA >90 04/05/2014    Lab Results  Component Value Date   WBC 9.2 11/28/2015   NEUTROABS 3.8 04/05/2014   HGB 16.3 (H) 11/28/2015   HCT 47.6 (H) 11/28/2015   MCV 92.7 11/28/2015   PLT 185 11/28/2015   Lab Results  Component Value Date   IRON 69 11/28/2015   TIBC 296 11/28/2015   IRONPCTSAT 23 11/28/2015   Lab Results  Component Value Date   FERRITIN 71 11/28/2015     STUDIES: US Abdomen Complete  Result Date: 12/07/2015 CLINICAL DATA:  Left upper quadrant pain, possible splenomegaly EXAM: ABDOMEN ULTRASOUND COMPLETE COMPARISON:  CT scan 03/25/2014 FINDINGS: Gallbladder: Surgically absent Common bile duct: Diameter: 1 cm in diameter probable postcholecystectomy Liver: No focal lesion identified. Within normal limits in parenchymal echogenicity. IVC: No abnormality visualized. Pancreas: Partially obscured by abundant bowel gas Spleen: Size and appearance within normal limits. Measures 7.8 cm in length Right Kidney: Length: 11.5 cm. Echogenicity within normal limits. No mass or hydronephrosis visualized. Left Kidney: Length: 11 cm. Echogenicity within normal limits. No mass or  hydronephrosis visualized. Abdominal aorta: No aneurysm visualized. Measures 2.7 cm in diameter. Other findings: None. IMPRESSION: 1. Status postcholecystectomy. Mild CBD dilatation probable postcholecystectomy. 2. No hydronephrosis. 3. No aortic aneurysm.  No focal hepatic mass. Electronically Signed   By: Natasha Mead M.D.   On: 12/07/2015 11:36    ASSESSMENT: Secondary polycythemia.  PLAN:    1. Secondary polycythemia: Likely secondary to patient's tobacco use given her significantly elevated blood carbon monoxide level of 7.1. The remainder of her laboratory work including JAK-2 mutation was either negative or within normal limits. No intervention is needed at this time. Patient does not require phlebotomy. She has been instructed about smoking cessation. Return to clinic in 3 months with repeat laboratory work and further evaluation. If her hemoglobin remains relatively stable or decreases, she likely can be discharged from clinic. 2. Weight loss: Monitor. Can consider CT scans if no distinct etiology is determined. 3. Left upper quadrant pain: Abdominal ultrasound results reviewed independently and reported as above with no obvious etiology.  Patient expressed understanding and was in agreement with this plan. She also understands that She can call clinic at any time with any questions, concerns, or complaints.    Jeralyn Ruths, MD   12/17/2015 9:48 AM

## 2015-12-12 ENCOUNTER — Ambulatory Visit: Payer: Medicare Other | Admitting: Oncology

## 2015-12-12 ENCOUNTER — Inpatient Hospital Stay (HOSPITAL_BASED_OUTPATIENT_CLINIC_OR_DEPARTMENT_OTHER): Payer: Medicare Other | Admitting: Oncology

## 2015-12-12 VITALS — BP 122/88 | HR 75 | Temp 98.9°F | Resp 18 | Wt 217.7 lb

## 2015-12-12 DIAGNOSIS — D751 Secondary polycythemia: Secondary | ICD-10-CM

## 2015-12-12 DIAGNOSIS — R634 Abnormal weight loss: Secondary | ICD-10-CM

## 2015-12-12 DIAGNOSIS — Z79899 Other long term (current) drug therapy: Secondary | ICD-10-CM

## 2015-12-12 DIAGNOSIS — R1012 Left upper quadrant pain: Secondary | ICD-10-CM | POA: Diagnosis not present

## 2015-12-12 DIAGNOSIS — F17210 Nicotine dependence, cigarettes, uncomplicated: Secondary | ICD-10-CM | POA: Diagnosis not present

## 2015-12-12 NOTE — Progress Notes (Signed)
States is having pain in LUQ of abdomen and pain in right lower pelvis.

## 2016-02-21 DIAGNOSIS — E039 Hypothyroidism, unspecified: Secondary | ICD-10-CM | POA: Insufficient documentation

## 2016-02-21 DIAGNOSIS — Z79899 Other long term (current) drug therapy: Secondary | ICD-10-CM | POA: Insufficient documentation

## 2016-02-21 DIAGNOSIS — I10 Essential (primary) hypertension: Secondary | ICD-10-CM | POA: Insufficient documentation

## 2016-02-21 DIAGNOSIS — Z5321 Procedure and treatment not carried out due to patient leaving prior to being seen by health care provider: Secondary | ICD-10-CM | POA: Insufficient documentation

## 2016-02-21 DIAGNOSIS — Z7951 Long term (current) use of inhaled steroids: Secondary | ICD-10-CM | POA: Diagnosis not present

## 2016-02-21 DIAGNOSIS — R05 Cough: Secondary | ICD-10-CM | POA: Insufficient documentation

## 2016-02-21 DIAGNOSIS — F17210 Nicotine dependence, cigarettes, uncomplicated: Secondary | ICD-10-CM | POA: Diagnosis not present

## 2016-02-22 ENCOUNTER — Emergency Department: Payer: Medicare Other

## 2016-02-22 ENCOUNTER — Encounter: Payer: Self-pay | Admitting: *Deleted

## 2016-02-22 ENCOUNTER — Emergency Department
Admission: EM | Admit: 2016-02-22 | Discharge: 2016-02-22 | Disposition: A | Discharge status: Home / Self Care | Payer: Medicare Other | Attending: Emergency Medicine | Admitting: Emergency Medicine

## 2016-02-22 NOTE — ED Triage Notes (Signed)
Pt brought in via ems from home with a cough for 4 days.  Pt reports anterior rib pain from coughing so much.  cig smoker.  Pt coughing up grey phlegm.

## 2016-03-20 ENCOUNTER — Other Ambulatory Visit: Payer: Self-pay | Admitting: Nurse Practitioner

## 2016-03-20 DIAGNOSIS — Z1231 Encounter for screening mammogram for malignant neoplasm of breast: Secondary | ICD-10-CM

## 2016-04-30 ENCOUNTER — Ambulatory Visit: Payer: Medicare Other

## 2016-05-28 ENCOUNTER — Ambulatory Visit: Payer: Medicare Other

## 2016-06-05 ENCOUNTER — Other Ambulatory Visit: Payer: Self-pay | Admitting: Nurse Practitioner

## 2016-06-05 DIAGNOSIS — G8929 Other chronic pain: Secondary | ICD-10-CM

## 2016-06-05 DIAGNOSIS — M545 Low back pain: Principal | ICD-10-CM

## 2016-06-17 ENCOUNTER — Ambulatory Visit: Payer: Medicare Other

## 2016-12-19 ENCOUNTER — Other Ambulatory Visit: Payer: Self-pay | Admitting: Nurse Practitioner

## 2016-12-19 DIAGNOSIS — R599 Enlarged lymph nodes, unspecified: Secondary | ICD-10-CM

## 2016-12-24 ENCOUNTER — Ambulatory Visit: Payer: Medicare Other

## 2016-12-26 ENCOUNTER — Ambulatory Visit
Admission: RE | Admit: 2016-12-26 | Discharge: 2016-12-26 | Disposition: A | Discharge status: Home / Self Care | Payer: Medicare Other | Source: Ambulatory Visit | Attending: Nurse Practitioner | Admitting: Nurse Practitioner

## 2017-01-01 ENCOUNTER — Ambulatory Visit
Admission: RE | Admit: 2017-01-01 | Discharge: 2017-01-01 | Disposition: A | Discharge status: Home / Self Care | Payer: Medicare Other | Source: Ambulatory Visit | Attending: Nurse Practitioner | Admitting: Nurse Practitioner

## 2017-01-01 DIAGNOSIS — R599 Enlarged lymph nodes, unspecified: Secondary | ICD-10-CM | POA: Insufficient documentation

## 2017-02-26 ENCOUNTER — Other Ambulatory Visit: Payer: Self-pay | Admitting: Nurse Practitioner

## 2017-02-26 DIAGNOSIS — Z1231 Encounter for screening mammogram for malignant neoplasm of breast: Secondary | ICD-10-CM

## 2017-03-03 ENCOUNTER — Emergency Department: Payer: Medicare Other

## 2017-03-03 ENCOUNTER — Encounter: Payer: Self-pay | Admitting: Emergency Medicine

## 2017-03-03 ENCOUNTER — Emergency Department
Admission: EM | Admit: 2017-03-03 | Discharge: 2017-03-03 | Disposition: A | Discharge status: Home / Self Care | Payer: Medicare Other | Attending: Emergency Medicine | Admitting: Emergency Medicine

## 2017-03-03 ENCOUNTER — Other Ambulatory Visit: Payer: Self-pay

## 2017-03-03 DIAGNOSIS — M47896 Other spondylosis, lumbar region: Secondary | ICD-10-CM

## 2017-03-03 DIAGNOSIS — E039 Hypothyroidism, unspecified: Secondary | ICD-10-CM | POA: Insufficient documentation

## 2017-03-03 DIAGNOSIS — J45909 Unspecified asthma, uncomplicated: Secondary | ICD-10-CM | POA: Insufficient documentation

## 2017-03-03 DIAGNOSIS — Z79899 Other long term (current) drug therapy: Secondary | ICD-10-CM | POA: Diagnosis not present

## 2017-03-03 DIAGNOSIS — M5136 Other intervertebral disc degeneration, lumbar region: Secondary | ICD-10-CM | POA: Diagnosis not present

## 2017-03-03 DIAGNOSIS — R109 Unspecified abdominal pain: Secondary | ICD-10-CM | POA: Diagnosis present

## 2017-03-03 DIAGNOSIS — I10 Essential (primary) hypertension: Secondary | ICD-10-CM | POA: Diagnosis not present

## 2017-03-03 DIAGNOSIS — R1012 Left upper quadrant pain: Secondary | ICD-10-CM

## 2017-03-03 DIAGNOSIS — F17210 Nicotine dependence, cigarettes, uncomplicated: Secondary | ICD-10-CM | POA: Insufficient documentation

## 2017-03-03 DIAGNOSIS — K5730 Diverticulosis of large intestine without perforation or abscess without bleeding: Secondary | ICD-10-CM | POA: Insufficient documentation

## 2017-03-03 HISTORY — DX: Unspecified asthma, uncomplicated: J45.909

## 2017-03-03 LAB — CBC WITH DIFFERENTIAL/PLATELET
BASOS ABS: 0 10*3/uL (ref 0–0.1)
Basophils Relative: 0 %
EOS ABS: 0.2 10*3/uL (ref 0–0.7)
EOS PCT: 2 %
HCT: 42.9 % (ref 35.0–47.0)
HEMOGLOBIN: 15.1 g/dL (ref 12.0–16.0)
LYMPHS PCT: 40 %
Lymphs Abs: 3.7 10*3/uL — ABNORMAL HIGH (ref 1.0–3.6)
MCH: 33 pg (ref 26.0–34.0)
MCHC: 35.2 g/dL (ref 32.0–36.0)
MCV: 93.7 fL (ref 80.0–100.0)
Monocytes Absolute: 0.9 10*3/uL (ref 0.2–0.9)
Monocytes Relative: 10 %
NEUTROS PCT: 48 %
Neutro Abs: 4.4 10*3/uL (ref 1.4–6.5)
Platelets: 165 10*3/uL (ref 150–440)
RBC: 4.58 MIL/uL (ref 3.80–5.20)
RDW: 14 % (ref 11.5–14.5)
WBC: 9.3 10*3/uL (ref 3.6–11.0)

## 2017-03-03 LAB — COMPREHENSIVE METABOLIC PANEL
ALBUMIN: 3.4 g/dL — AB (ref 3.5–5.0)
ALK PHOS: 87 U/L (ref 38–126)
ALT: 21 U/L (ref 14–54)
AST: 29 U/L (ref 15–41)
Anion gap: 9 (ref 5–15)
BILIRUBIN TOTAL: 0.6 mg/dL (ref 0.3–1.2)
BUN: 11 mg/dL (ref 6–20)
CALCIUM: 8.9 mg/dL (ref 8.9–10.3)
CO2: 28 mmol/L (ref 22–32)
Chloride: 105 mmol/L (ref 101–111)
Creatinine, Ser: 0.53 mg/dL (ref 0.44–1.00)
GFR calc Af Amer: >60 mL/min (ref >60)
GFR calc non Af Amer: >60 mL/min (ref >60)
GLUCOSE: 114 mg/dL — AB (ref 65–99)
Potassium: 3.6 mmol/L (ref 3.5–5.1)
SODIUM: 142 mmol/L (ref 135–145)
TOTAL PROTEIN: 6.3 g/dL — AB (ref 6.5–8.1)

## 2017-03-03 LAB — LIPASE, BLOOD: Lipase: 18 U/L (ref 11–51)

## 2017-03-03 MED ORDER — ONDANSETRON HCL 4 MG/2ML IJ SOLN
4.0000 mg | Freq: Once | INTRAMUSCULAR | Status: AC
  Administered 2017-03-03: 4 mg via INTRAVENOUS
  Filled 2017-03-03: qty 2

## 2017-03-03 MED ORDER — MORPHINE SULFATE (PF) 2 MG/ML IV SOLN
2.0000 mg | Freq: Once | INTRAVENOUS | Status: AC
  Administered 2017-03-03: 2 mg via INTRAVENOUS
  Filled 2017-03-03: qty 1

## 2017-03-03 MED ORDER — OXYCODONE-ACETAMINOPHEN 5-325 MG PO TABS: 1.0000 | ORAL_TABLET | ORAL | 0 refills | Status: DC | PRN

## 2017-03-03 MED ORDER — OXYCODONE-ACETAMINOPHEN 5-325 MG PO TABS
1.0000 | ORAL_TABLET | Freq: Once | ORAL | Status: AC
  Administered 2017-03-03: 1 via ORAL
  Filled 2017-03-03: qty 1

## 2017-03-03 NOTE — ED Triage Notes (Signed)
Pt presents to ED from home with c/o severe left side pain. Pt reports having sudden onset of pain after getting out of her recliner chair this evening just prior to arrival. Pt states she heard a "pop". Pt tearful. Reports "5" different times similar symptoms have occurred.

## 2017-03-03 NOTE — ED Provider Notes (Signed)
[facility] Emergency Department Provider Note   First MD Initiated Contact with Patient 03/03/17 0301     (approximate)  I have reviewed the triage vital signs and the nursing notes.   HISTORY  Chief Complaint Abdominal Pain   HPI April Soto is a 56 y.o. female with results of chronic medical conditions presents to the emergency department with acute onset of left side pain that is currently 9 out of 10.  Patient states pain began when she stood up out of her recliner.  Patient states she felt a pop in the area with subsequent pain that has been persistent.  Patient denies any nausea or vomiting.     Past Medical History:  Diagnosis Date  . Anemia    "years ago"  . Anxiety    takes Ativan daily  . Asthma   . Bipolar 1 disorder (HCC)   . Chronic back pain    HNP  . Constipation    takes Ra [employer] daily  . Degenerative disc disease   . Depression    was on Prozac but taken off by MD for a couple of weeks  . Depression   . Fibromyalgia    takes Lyrica daily  . Genital herpes   . GERD (gastroesophageal reflux disease)    takes Nexium daily  . Headache(784.0)   . Heart murmur    "grew out of"  . History of bronchitis    last time 15+yrs ago  . History of rheumatic fever   . Hyperlipidemia    takes Simvastatin daily  . Hypertension    takes Coreg daily  . Hypothyroidism    takes Synthroid daily  . Joint pain   . Muscle spasm    takes Flexeril daily as needed  . Nocturia   . Osteoarthritis   . Peripheral edema    takes HCTZ daily  . Pneumonia    hx of;last time 30+yrs ago  . Raynaud's disease   . Rheumatic fever    as a child  . Seasonal allergies    takes Zyrtec daily and uses Dymista as well  . Stroke [facility](HCC)    ? 5 years ago. No lasting deficits  . Urinary incontinence   . Weakness    nmbness and tingling-right leg    Patient Active Problem List   Diagnosis Date Noted  . Weight loss 11/28/2015  . Polycythemia,  secondary 11/27/2015  . Herniated nucleus pulposus, lumbar 04/13/2014  . Degenerative disc disease, lumbar 04/05/2014  . Right-sided low back pain with right-sided sciatica 04/05/2014  . Hypothyroidism 04/05/2014  . Hypokalemia 04/05/2014  . Asthma, chronic   . Back pain 04/02/2014  . Other specified examination 07/08/2013  . MRSA colonization 07/05/2013  . Rash and nonspecific skin eruption 07/05/2013  . Screening for STD (sexually transmitted disease) 07/05/2013    Past Surgical History:  Procedure Laterality Date  . ABDOMINAL HYSTERECTOMY    . BACK SURGERY    . CHOLECYSTECTOMY    . COLONOSCOPY    . ESOPHAGOGASTRODUODENOSCOPY    . FOOT SURGERY Right 12/2013   ankle, heel and top of foot  . Lumbar four-five Posterior Lumbar Interbody Fusion N/A 04/13/2014   Performed by Reinaldo MeekerKritzer, Randy O, [facility]  . OTHER SURGICAL HISTORY     Gastric stapling for weight loss. patient reports done in 1980's  . RIGHT LUMBAR FOUR FIVE  LAMINECTOMY/DECOMPRESSION MICRODISCECTOMY 1 LEVEL Right 01/14/2013   Performed by Reinaldo MeekerKritzer, Randy O,  MD at [facility] NEURO ORS  . SEPTOPLASTY    . TONSILLECTOMY    . TUBAL LIGATION      Prior to Admission medications   Medication Sig Start Date End Date Taking? Authorizing Provider  acyclovir (ZOVIRAX) 400 MG tablet Take 400 mg by mouth at bedtime.     [provider]  albuterol (PROAIR HFA) 108 (90 BASE) MCG/ACT inhaler Inhale 2 puffs into the lungs every 4 (four) hours as needed for wheezing or shortness of breath.     [provider]  amLODipine (NORVASC) 10 MG tablet Take 10 mg by mouth at bedtime.     [provider]  cetirizine (ZYRTEC) 10 MG tablet Take 10 mg by mouth at bedtime as needed for allergies.     [provider]  esomeprazole (NEXIUM) 40 MG capsule Take 40 mg by mouth at bedtime as needed (acid reflux).     [provider]  fluticasone (FLONASE) 50 MCG/ACT nasal spray Place 1 spray into both nostrils  at bedtime.  07/01/13   [provider]  hydrocortisone (ANUSOL-HC) 2.5 % rectal cream Apply 2.5 application topically as needed.     [provider]  levothyroxine (SYNTHROID, LEVOTHROID) 88 MCG tablet Take 88 mcg by mouth daily before breakfast.  03/05/14   [provider]  LORazepam (ATIVAN) 1 MG tablet Take 1 mg by mouth 3 (three) times daily.    [provider]  Morphine-Naltrexone (EMBEDA) 30-1.2 MG CPCR Take 1 capsule by mouth 2 (two) times daily.    [provider]  Olopatadine HCl (PATADAY) 0.2 % SOLN Place 1 drop into both eyes at bedtime as needed (allergies).    [provider]  OVER THE COUNTER MEDICATION 1 capsule as needed (daily as needed).    [provider]  oxyCODONE-acetaminophen (PERCOCET) 10-325 MG tablet Take 1 tablet by mouth every 6 (six) hours as needed for pain.    [provider]  oxyCODONE-acetaminophen (ROXICET) 5-325 MG tablet Take 1 tablet every 4 (four) hours as needed by mouth for severe pain. 03/03/17   Darci Current, MD  pregabalin (LYRICA) 150 MG capsule Take 150 mg by mouth 3 (three) times daily.    [provider]    Allergies Celebrex [celecoxib] and Amoxicillin  Family History  Problem Relation Age of Onset  . Diabetes type II Mother   . Hypertension Mother   . Diabetes Mother   . Diabetes Sister   . Hypertension Sister   . Hypertension Maternal Grandmother   . Diabetes Maternal Grandmother     Social History Social History   Tobacco Use  . Smoking status: Current Some Day Smoker    Packs/day: 0.50    Years: 40.00    Pack years: 20.00    Types: Cigarettes  . Smokeless tobacco: Never Used  . Tobacco comment: trying to quit as of 04/10/14  Substance Use Topics  . Alcohol use: No  . Drug use: No    Review of Systems Constitutional: No fever/chills Eyes: No visual changes. ENT: No sore throat. Cardiovascular: Denies chest pain. Respiratory: Denies  shortness of breath. Gastrointestinal: No abdominal pain.  No nausea, no vomiting.  No diarrhea.  No constipation. Genitourinary: Negative for dysuria. Musculoskeletal: Negative for neck pain.  Positive for back pain. Integumentary: Negative for rash. Neurological: Negative for headaches, focal weakness or numbness.   ____________________________________________   PHYSICAL EXAM:  VITAL SIGNS: ED Triage Vitals  Enc Vitals Group     BP 03/03/17  0303 (!) 152/98     Pulse Rate 03/03/17 0303 94     Resp 03/03/17 0303 (!) 22     Temp 03/03/17 0303 99.1 F (37.3 C)     Temp Source 03/03/17 0303 Oral     SpO2 03/03/17 0303 97 %     Weight 03/03/17 0303 106.6 kg (235 lb)     Height 03/03/17 0303 1.676 m (5\' 6" )     Head Circumference --      Peak Flow --      Pain Score 03/03/17 0302 9     Pain Loc --      Pain Edu? --      Excl. in GC? --     Constitutional: Alert and oriented.  Apparent discomfort Eyes: Conjunctivae are normal. PERRL. EOMI. Head: Atraumatic. Mouth/Throat: Mucous membranes are moist. Neck: No stridor. Cardiovascular: Normal rate, regular rhythm. Good peripheral circulation. Grossly normal heart sounds. Respiratory: Normal respiratory effort.  No retractions. Lungs CTAB. Gastrointestinal: Left upper quadrant tenderness to palpation. No distention.  Musculoskeletal: No lower extremity tenderness nor edema. No gross deformities of extremities. Neurologic:  Normal speech and language. No gross focal neurologic deficits are appreciated.  Skin:  Skin is warm, dry and intact. No rash noted. Psychiatric: Mood and affect are normal. Speech and behavior are normal.  ____________________________________________   LABS (all labs ordered are listed, but only abnormal results are displayed)  Labs Reviewed  CBC WITH DIFFERENTIAL/PLATELET - Abnormal; Notable for the following components:      Result Value   Lymphs Abs 3.7 (*)    All other components within normal  limits  COMPREHENSIVE METABOLIC PANEL - Abnormal; Notable for the following components:   Glucose, Bld 114 (*)    Total Protein 6.3 (*)    Albumin 3.4 (*)    All other components within normal limits  LIPASE, BLOOD   ____________________________________________  EKG  Time: 3:04 AM Rate:91 Rhythm: Normal sinus rhythm Axis: Normal Interval: Normal ST segment T wave: No ST segment or T wave changes ____________________________________________  RADIOLOGY I,  N Hardy Harcum, personally viewed and evaluated these images (plain radiographs) as part of my medical decision making, as well as reviewing the written report by the radiologist.  Dg Chest 2 View  Result Date: 03/03/2017 CLINICAL DATA:  Severe left-sided pain, sudden onset this evening. EXAM: CHEST  2 VIEW COMPARISON:  07/04/2014 FINDINGS: Normal heart size and pulmonary vascularity. Mild peribronchial thickening and central interstitial changes likely representing chronic bronchitis. No airspace disease or consolidation. No blunting of costophrenic angles. No pneumothorax. Surgical clips in the right upper quadrant. IMPRESSION: Chronic bronchitic changes in the lungs. No evidence of active pulmonary disease. Electronically Signed   By: Burman NievesWilliam  Stevens M.D.   On: 03/03/2017 03:19   Ct Renal Stone Study  Result Date: 03/03/2017 CLINICAL DATA:  56 year old female with left flank pain. EXAM: CT ABDOMEN AND PELVIS WITHOUT CONTRAST TECHNIQUE: Multidetector CT imaging of the abdomen and pelvis was performed following the standard protocol without IV contrast. COMPARISON:  Abdominal ultrasound dated 11/17/2015 and CT dated 03/25/2014 FINDINGS: Evaluation of this exam is limited in the absence of intravenous contrast. Lower chest: The visualized lung bases are clear. No intra-abdominal free air or free fluid. Hepatobiliary: Cholecystectomy. The liver is unremarkable. Mild dilatation of the biliary tree, likely post cholecystectomy. No CT  evidence of retained stone in the central CBD. Pancreas: Unremarkable. No pancreatic ductal dilatation or surrounding inflammatory changes. Spleen: Normal in size without focal abnormality. Adrenals/Urinary  Tract: There is a 13 mm right adrenal adenoma. The left adrenal gland is unremarkable. There is no hydronephrosis or nephrolithiasis on either side. The visualized ureters and urinary bladder appear unremarkable. Stomach/Bowel: There is sigmoid diverticulosis with muscular hypertrophy. No active inflammatory changes. There is moderate stool throughout the colon. No bowel obstruction or active inflammation. There postsurgical changes of gastric bypass. The appendix is normal. Vascular/Lymphatic: Mild atherosclerotic calcification of the abdominal aorta. No aneurysmal dilatation. Evaluation of the vasculature is limited in the absence of intravenous contrast. No portal venous gas. There is no adenopathy. Reproductive: Hysterectomy.  No pelvic mass. Other: Anterior abdominal wall incisional scar. Musculoskeletal: Osteopenia with degenerative changes of the spine and hips. L4-L5 laminectomy, disc spacer, and posterior fusion hardware. No acute osseous pathology. IMPRESSION: 1. No acute intra-abdominal or pelvic pathology. No hydronephrosis or nephrolithiasis. 2. Postsurgical changes of gastric bypass. No bowel obstruction or active inflammation. Normal appendix. 3. Colonic diverticulosis. 4.  Aortic Atherosclerosis (ICD10-I70.0). 5. Degenerative changes of the spine and advanced arthritic changes of the right hip. Electronically Signed   By: Elgie Collard M.D.   On: 03/03/2017 03:30      Procedures   ____________________________________________   INITIAL IMPRESSION / ASSESSMENT AND PLAN / ED COURSE  As part of my medical decision making, I reviewed the following data within the electronic MEDICAL RECORD [id number] year old female presented with above-stated history and physical exam of acute nontraumatic  left flank pain laboratory data unremarkable CT scan revealed no acute abnormal likewise chest x-ray.  Patient referred to primary care provider for further outpatient evaluation and management.    ____________________________________________  FINAL CLINICAL IMPRESSION(S) / ED DIAGNOSES  Final diagnoses:  Left upper quadrant pain  Diverticulosis of large intestine without hemorrhage  Other osteoarthritis of spine, lumbar region     MEDICATIONS GIVEN DURING THIS VISIT:  Medications  morphine 2 MG/ML injection 2 mg (2 mg Intravenous Given 03/03/17 0340)  ondansetron (ZOFRAN) injection 4 mg (4 mg Intravenous Given 03/03/17 0340)  oxyCODONE-acetaminophen (PERCOCET/ROXICET) 5-325 MG per tablet 1 tablet (1 tablet Oral Given 03/03/17 0520)     ED Discharge Orders        Ordered    oxyCODONE-acetaminophen (ROXICET) 5-325 MG tablet  Every 4 hours PRN     03/03/17 0521       Note:  This document was prepared using Dragon voice recognition software and may include unintentional dictation errors.    Darci Current, MD 03/03/17 [phone]

## 2017-03-03 NOTE — ED Notes (Signed)
Pt to xray

## 2017-03-13 ENCOUNTER — Other Ambulatory Visit: Payer: Self-pay

## 2017-03-13 ENCOUNTER — Emergency Department (HOSPITAL_COMMUNITY): Payer: Medicare Other

## 2017-03-13 ENCOUNTER — Emergency Department (HOSPITAL_COMMUNITY)
Admission: EM | Admit: 2017-03-13 | Discharge: 2017-03-13 | Disposition: A | Discharge status: Home / Self Care | Payer: Medicare Other | Attending: Emergency Medicine | Admitting: Emergency Medicine

## 2017-03-13 ENCOUNTER — Encounter (HOSPITAL_COMMUNITY): Payer: Self-pay

## 2017-03-13 DIAGNOSIS — W19XXXA Unspecified fall, initial encounter: Secondary | ICD-10-CM

## 2017-03-13 DIAGNOSIS — M25571 Pain in right ankle and joints of right foot: Secondary | ICD-10-CM | POA: Diagnosis present

## 2017-03-13 DIAGNOSIS — Z79899 Other long term (current) drug therapy: Secondary | ICD-10-CM | POA: Insufficient documentation

## 2017-03-13 DIAGNOSIS — J45909 Unspecified asthma, uncomplicated: Secondary | ICD-10-CM | POA: Diagnosis not present

## 2017-03-13 DIAGNOSIS — M25561 Pain in right knee: Secondary | ICD-10-CM | POA: Diagnosis not present

## 2017-03-13 DIAGNOSIS — W101XXA Fall (on)(from) sidewalk curb, initial encounter: Secondary | ICD-10-CM | POA: Diagnosis not present

## 2017-03-13 DIAGNOSIS — F17210 Nicotine dependence, cigarettes, uncomplicated: Secondary | ICD-10-CM | POA: Insufficient documentation

## 2017-03-13 DIAGNOSIS — E039 Hypothyroidism, unspecified: Secondary | ICD-10-CM | POA: Insufficient documentation

## 2017-03-13 MED ORDER — MORPHINE SULFATE (PF) 4 MG/ML IV SOLN
4.0000 mg | Freq: Once | INTRAVENOUS | Status: AC
  Administered 2017-03-13: 4 mg via INTRAMUSCULAR
  Filled 2017-03-13: qty 1

## 2017-03-13 MED ORDER — MORPHINE SULFATE (PF) 4 MG/ML IV SOLN
6.0000 mg | Freq: Once | INTRAVENOUS | Status: AC
  Administered 2017-03-13: 6 mg via INTRAMUSCULAR
  Filled 2017-03-13: qty 2

## 2017-03-13 NOTE — Discharge Instructions (Signed)
Ice affected area for pain / swelling relief. Alternate between Tylenol and Ibuprofen as needed for additional pain. Crutches and knee sleeve as needed for comfort.   Follow up with the orthopedic doctor listed if symptoms do not begin to improve in 1 week.   Return to ER for new or worsening symptoms, any additional concerns.

## 2017-03-13 NOTE — ED Provider Notes (Signed)
[facility]-EMERGENCY DEPT Provider Note   CSN: [id number] Arrival date & time: 03/13/17  1202     History   Chief Complaint No chief complaint on file.   HPI April Soto is a 56 y.o. female.  April Soto is a 56 y.o. Female presents to the emergency department after a trip and fall complaining of right leg pain.  [facility] on [facility] curb and her right ankle twisted and she fell on her right knee. She reports pain to her right knee, along her right shin, and right lateral ankle. She denies hitting her head or LOC. Pain in her right knee is worse with movement. She ambulated after the fall. She attempted no treatments prior to arrival. She denies fevers, neck pain, numbness, tingling, weakness, or head injury.    The history is provided by the patient and medical records. No language interpreter was used.    Past Medical History:  Diagnosis Date  . Anemia    "years ago"  . Anxiety    takes Ativan daily  . Asthma   . Bipolar 1 disorder (HCC)   . Chronic back pain    HNP  . Constipation    takes Ra [employer] daily  . Degenerative disc disease   . Depression    was on Prozac but taken off by MD for a couple of weeks  . Depression   . Fibromyalgia    takes Lyrica daily  . Genital herpes   . GERD (gastroesophageal reflux disease)    takes Nexium daily  . Headache(784.0)   . Heart murmur    "grew out of"  . History of bronchitis    last time 15+yrs ago  . History of rheumatic fever   . Hyperlipidemia    takes Simvastatin daily  . Hypertension    takes Coreg daily  . Hypothyroidism    takes Synthroid daily  . Joint pain   . Muscle spasm    takes Flexeril daily as needed  . Nocturia   . Osteoarthritis   . Peripheral edema    takes HCTZ daily  . Pneumonia    hx of;last time 30+yrs ago  . Raynaud's disease   . Rheumatic fever    as a child  . Seasonal allergies    takes Zyrtec daily and uses  Dymista as well  . Stroke [facility](HCC)    ? 5 years ago. No lasting deficits  . Urinary incontinence   . Weakness    nmbness and tingling-right leg    Patient Active Problem List   Diagnosis Date Noted  . Weight loss 11/28/2015  . Polycythemia, secondary 11/27/2015  . Herniated nucleus pulposus, lumbar 04/13/2014  . Degenerative disc disease, lumbar 04/05/2014  . Right-sided low back pain with right-sided sciatica 04/05/2014  . Hypothyroidism 04/05/2014  . Hypokalemia 04/05/2014  . Asthma, chronic   . Back pain 04/02/2014  . Other specified examination 07/08/2013  . MRSA colonization 07/05/2013  . Rash and nonspecific skin eruption 07/05/2013  . Screening for STD (sexually transmitted disease) 07/05/2013    Past Surgical History:  Procedure Laterality Date  . ABDOMINAL HYSTERECTOMY    . BACK SURGERY    . CHOLECYSTECTOMY    . COLONOSCOPY    . ESOPHAGOGASTRODUODENOSCOPY    . FOOT SURGERY Right 12/2013   ankle, heel and top of foot  . LUMBAR LAMINECTOMY/DECOMPRESSION MICRODISCECTOMY Right 01/14/2013   Procedure: RIGHT LUMBAR FOUR FIVE  LAMINECTOMY/DECOMPRESSION MICRODISCECTOMY 1 LEVEL;  Surgeon: Reinaldo Meekerandy O Kritzer, MD;  Location: [facility] NEURO ORS;  Service: Neurosurgery;  Laterality: Right;  RIGHT L45 microdiskectomy  . OTHER SURGICAL HISTORY     Gastric stapling for weight loss. patient reports done in 1980's  . SEPTOPLASTY    . TONSILLECTOMY    . TUBAL LIGATION      OB History    No data available       Home Medications    Prior to Admission medications   Medication Sig Start Date End Date Taking? Authorizing Provider  acyclovir (ZOVIRAX) 400 MG tablet Take 400 mg by mouth at bedtime.    Yes [provider]  albuterol (PROAIR HFA) 108 (90 BASE) MCG/ACT inhaler Inhale 2 puffs into the lungs every 4 (four) hours as needed for wheezing or shortness of breath.    Yes [provider]  amLODipine (NORVASC) 5 MG tablet Take 5 mg by mouth daily.  12/05/16  Yes [provider]  atorvastatin (LIPITOR) 40 MG tablet Take 40 mg by mouth daily.  02/21/17  Yes [provider]  cetirizine (ZYRTEC) 10 MG tablet Take 10 mg by mouth at bedtime as needed for allergies.    Yes [provider]  diclofenac (VOLTAREN) 75 MG EC tablet Take 75 mg by mouth daily as needed for mild pain or moderate pain.  02/19/17  Yes [provider]  esomeprazole (NEXIUM) 40 MG capsule Take 40 mg by mouth at bedtime as needed (acid reflux).    Yes [provider]  fluticasone (FLONASE) 50 MCG/ACT nasal spray Place 1 spray into both nostrils at bedtime as needed for allergies.  07/01/13  Yes [provider]  levothyroxine (SYNTHROID, LEVOTHROID) 112 MCG tablet Take 112 mcg by mouth daily before breakfast.  02/19/17  Yes [provider]  LINZESS 145 MCG CAPS capsule Take 145 mcg by mouth daily as needed (cramping).  02/18/17  Yes [provider]  LORazepam (ATIVAN) 1 MG tablet Take 1 mg by mouth 3 (three) times daily.   Yes [provider]  metoprolol succinate (TOPROL-XL) 50 MG 24 hr tablet Take 50 mg by mouth daily.  02/16/17  Yes [provider]  Morphine-Naltrexone (EMBEDA) 30-1.2 MG CPCR Take 1 capsule by mouth 2 (two) times daily.   Yes [provider]  oxyCODONE-acetaminophen (PERCOCET) 10-325 MG tablet Take 1 tablet by mouth every 6 (six) hours as needed for pain.   Yes [provider]  oxyCODONE-acetaminophen (ROXICET) 5-325 MG tablet Take 1 tablet every 4 (four) hours as needed by mouth for severe pain. 03/03/17  Yes Darci CurrentBrown, [place] N, MD  pregabalin (LYRICA) 150 MG capsule Take 150 mg by mouth 3 (three) times daily as needed (pain).    Yes [provider]  venlafaxine XR (EFFEXOR-XR) 37.5 MG 24 hr capsule Take 37.5 mg by mouth daily with breakfast.  02/16/17  Yes [provider]  amLODipine (NORVASC) 10 MG tablet Take 10 mg by mouth at bedtime.     [provider]    clindamycin (CLEOCIN) 2 % vaginal cream  01/02/17   [provider]  hydrocortisone (ANUSOL-HC) 2.5 % rectal cream Apply 2.5 application topically as needed.     [provider]  levothyroxine (SYNTHROID, LEVOTHROID) 88 MCG tablet Take 88 mcg by mouth daily before breakfast.  03/05/14   [provider]  Olopatadine HCl (PATADAY) 0.2 % SOLN Place 1 drop into both eyes at bedtime as needed (allergies).  [provider]  OVER THE COUNTER MEDICATION Take 1 capsule by mouth daily as needed (for constipation).     [provider]  SPIRIVA HANDIHALER 18 MCG inhalation capsule Place 18 mcg into inhaler and inhale daily.  02/18/17   [provider]  VRAYLAR 1.5 MG CAPS Take 1.5 mg by mouth daily.  03/13/17   [provider]    Family History Family History  Problem Relation Age of Onset  . Diabetes type II Mother   . Hypertension Mother   . Diabetes Mother   . Diabetes Sister   . Hypertension Sister   . Hypertension Maternal Grandmother   . Diabetes Maternal Grandmother     Social History Social History   Tobacco Use  . Smoking status: Current Some Day Smoker    Packs/day: 0.50    Years: 40.00    Pack years: 20.00    Types: Cigarettes  . Smokeless tobacco: Never Used  . Tobacco comment: trying to quit as of 04/10/14  Substance Use Topics  . Alcohol use: No  . Drug use: No     Allergies   Celebrex [celecoxib] and Amoxicillin   Review of Systems Review of Systems  Constitutional: Negative for fever.  HENT: Negative for nosebleeds.   Eyes: Negative for visual disturbance.  Respiratory: Negative for cough and shortness of breath.   Cardiovascular: Negative for chest pain.  Gastrointestinal: Negative for abdominal pain and vomiting.  Genitourinary: Negative for difficulty urinating.  Musculoskeletal: Positive for arthralgias and joint swelling. Negative for back pain and neck pain.  Skin: Negative for rash and  wound.  Neurological: Negative for syncope, weakness and numbness.     Physical Exam Updated Vital Signs BP (!) 136/95   Pulse 92   Temp 98 F (36.7 C) (Oral)   Resp 18   Ht 5\' 6"  (1.676 m)   Wt 106.6 kg (235 lb)   SpO2 96%   BMI 37.93 kg/m   Physical Exam  Constitutional: She is oriented to person, place, and time. She appears well-developed and well-nourished. No distress.  Nontoxic-appearing.  HENT:  Head: Normocephalic and atraumatic.  Right Ear: External ear normal.  Left Ear: External ear normal.  Eyes: Right eye exhibits no discharge. Left eye exhibits no discharge.  Neck: Neck supple.  Cardiovascular: Normal rate, regular rhythm, normal heart sounds and intact distal pulses.  Bilateral dorsalis pedis pulses are intact.  Pulmonary/Chest: Effort normal and breath sounds normal. No respiratory distress.  Abdominal: Soft. There is no tenderness.  Musculoskeletal: Normal range of motion. She exhibits edema and tenderness. She exhibits no deformity.  Patient has tenderness across the anterior and lateral aspect of her right knee.  No evidence of knee or patellar dislocation.  No obvious deformity or edema noted to her right knee.  Mild tenderness across her right shin as well as the lateral aspect of her right ankle with mild edema to the right ankle.  No ankle instability noted.  No knee instability noted.  No obvious deformity noted to her lower extremities.  No right hip or pelvis tenderness to palpation.  No pelvic instability.  She has range of motion at her right knee and ankle, albeit with pain.  Neurological: She is alert and oriented to person, place, and time. No sensory deficit. She exhibits normal muscle tone. Coordination normal.  Skin: Skin is warm and dry. Capillary refill takes less than 2 seconds. No rash noted. She is not diaphoretic. No erythema. No pallor.  Psychiatric: She  has a normal mood and affect. Her behavior is normal.  Nursing note and vitals  reviewed.    ED Treatments / Results  Labs (all labs ordered are listed, but only abnormal results are displayed) Labs Reviewed - No data to display  EKG  EKG Interpretation None       Radiology Dg Tibia/fibula Right  Result Date: 03/13/2017 CLINICAL DATA:  Right leg and knee pain and swelling after stepping off a curb and falling. EXAM: RIGHT TIBIA AND FIBULA - 2 VIEW COMPARISON:  Right knee obtained at the same time. Right ankle dated 03/30/2013. FINDINGS: No fracture dislocation. Linear metallic density overlying the proximal foot on the frontal view, not seen on the lateral view, possibly not included. This was not previously present. Soft tissue swelling at the level of the ankle. IMPRESSION: 1. No fracture or dislocation. 2. Linear metallic foreign body or artifact overlying the proximal foot on the frontal view. Electronically Signed   By: Beckie Salts M.D.   On: 03/13/2017 12:49   Dg Ankle Complete Right  Result Date: 03/13/2017 CLINICAL DATA:  Patient states she stepped off of a curb and tripped, landing on her right leg.; pain in right leg from knee to ankle; hx right foot surgery 3 years ago EXAM: RIGHT ANKLE - COMPLETE 3+ VIEW COMPARISON:  None. FINDINGS: Single fully threaded cannulated screw transfixing a calcaneal osteotomy. Single fully threaded cannulated screw transfixing the base of the first metatarsal with the distal breads extending through the plantar cortex of the first metatarsal. Generalized osteopenia. No acute fracture or dislocation. Ankle mortise is intact. No focal soft tissue abnormality. IMPRESSION: 1.  No acute osseous injury of the right ankle. Electronically Signed   By: Elige Ko   On: 03/13/2017 13:32   Dg Knee Complete 4 Views Right  Result Date: 03/13/2017 CLINICAL DATA:  Right knee pain after tripping and falling off a curb. EXAM: RIGHT KNEE - COMPLETE 4+ VIEW COMPARISON:  Right lower leg radiographs obtained at the same time and right femur  radiographs dated 03/30/2014. FINDINGS: Mild spur formation involving all 3 joint compartments. No fracture, dislocation or effusion. IMPRESSION: No fracture.  Mild tricompartmental degenerative changes. Electronically Signed   By: Beckie Salts M.D.   On: 03/13/2017 12:50    Procedures Procedures (including critical care time)  Medications Ordered in ED Medications  morphine 4 MG/ML injection 6 mg (6 mg Intramuscular Given 03/13/17 1301)  morphine 4 MG/ML injection 4 mg (4 mg Intramuscular Given 03/13/17 1520)     Initial Impression / Assessment and Plan / ED Course  I have reviewed the triage vital signs and the nursing notes.  Pertinent labs & imaging results that were available during my care of the patient were reviewed by me and considered in my medical decision making (see chart for details).     This is a 56 y.o. Female presents to the emergency department after a trip and fall complaining of right leg pain.  Patient reports she was at [facility] when she tripped on the curb and her right ankle twisted and she fell on her right knee. She reports pain to her right knee, along her right shin, and right lateral ankle. She denies hitting her head or LOC. Pain in her right knee is worse with movement.  On exam the patient is afebrile nontoxic-appearing.  There is no evidence of any deformity to her lower extremities.  Mild edema noted to the lateral aspect of her right ankle.  No  ankle or knee instability.  No evidence of patella or knee dislocation.  She is neurovascularly intact.  She is in significant pain.  X-rays showed no evidence of any fracture.  As the patient is having significant pain we will obtain a CT of her right knee to rule out tibial plateau fracture.  If this shows no evidence of fractures she can likely be discharged with knee sleeve and crutches.  At shift change patient is awaiting CT scan.  Patient care signed out to [facility], PA-C at shift change.  This patient was  discussed with and evaluated by Dr. Clarene Duke who agrees with assessment and plan.   Final Clinical Impressions(s) / ED Diagnoses   Final diagnoses:  Fall, initial encounter  Acute pain of right knee  Acute right ankle pain    ED Discharge Orders    None       Everlene Farrier, PA-C 03/13/17 1554    Little, Ambrose Finland, MD 03/18/17 2104

## 2017-03-13 NOTE — ED Triage Notes (Addendum)
Patient states she stepped off of a curb and tripped, landing on her right leg. patient c/o pain and swelling to the right leg and right knee. Deformity noted to the right lower leg.

## 2017-03-13 NOTE — ED Provider Notes (Signed)
Care assumed from previous provider PA Dansie. Please see note for further details. Case discussed, plan agreed upon. Briefly, patient is a 56 y.o. female who presented to ED for right leg pain 2/2 fall. Per previous provider, NVI on exam. X-rays obtained and reassuring, however given exam, will obtain CT knee for further evaluation. Will follow up pending CT knee and dispo appropriately. If negative, likely discharge to home with crutches, knee sleeve, pain control.   CT knee shows no acute abnormality with mild to moderate osteoarthritis changes noted. Patient informed of reassuring imaging. Crutches and knee sleeve provided. Symptomatic home care instructions and return precautions discussed. Ortho follow up if no improvement. All questions answered.   Phyillis Dascoli, Chase PicketJaime Pilcher, PA-C 03/13/17 1630    Bethann BerkshireZammit, Joseph, MD 03/13/17 [phone]

## 2017-08-16 ENCOUNTER — Other Ambulatory Visit: Payer: Self-pay

## 2017-08-16 ENCOUNTER — Emergency Department
Admission: EM | Admit: 2017-08-16 | Discharge: 2017-08-17 | Disposition: A | Discharge status: Other Acute Inpatient Hospital | Payer: Medicare Other | Attending: Internal Medicine | Admitting: Internal Medicine

## 2017-08-16 ENCOUNTER — Encounter: Payer: Self-pay | Admitting: Emergency Medicine

## 2017-08-16 DIAGNOSIS — I10 Essential (primary) hypertension: Secondary | ICD-10-CM | POA: Diagnosis not present

## 2017-08-16 DIAGNOSIS — R45851 Suicidal ideations: Secondary | ICD-10-CM | POA: Diagnosis not present

## 2017-08-16 DIAGNOSIS — F329 Major depressive disorder, single episode, unspecified: Secondary | ICD-10-CM | POA: Diagnosis not present

## 2017-08-16 DIAGNOSIS — J45909 Unspecified asthma, uncomplicated: Secondary | ICD-10-CM | POA: Diagnosis not present

## 2017-08-16 DIAGNOSIS — E039 Hypothyroidism, unspecified: Secondary | ICD-10-CM | POA: Insufficient documentation

## 2017-08-16 DIAGNOSIS — F32A Depression, unspecified: Secondary | ICD-10-CM

## 2017-08-16 DIAGNOSIS — F17210 Nicotine dependence, cigarettes, uncomplicated: Secondary | ICD-10-CM | POA: Insufficient documentation

## 2017-08-16 LAB — COMPREHENSIVE METABOLIC PANEL
ALK PHOS: 67 U/L (ref 38–126)
ALT: 13 U/L — AB (ref 14–54)
AST: 25 U/L (ref 15–41)
Albumin: 3.6 g/dL (ref 3.5–5.0)
Anion gap: 7 (ref 5–15)
BILIRUBIN TOTAL: 0.6 mg/dL (ref 0.3–1.2)
BUN: 8 mg/dL (ref 6–20)
CALCIUM: 8.8 mg/dL — AB (ref 8.9–10.3)
CHLORIDE: 105 mmol/L (ref 101–111)
CO2: 26 mmol/L (ref 22–32)
CREATININE: 0.55 mg/dL (ref 0.44–1.00)
GFR calc Af Amer: >60 mL/min (ref >60)
Glucose, Bld: 137 mg/dL — ABNORMAL HIGH (ref 65–99)
Potassium: 4.2 mmol/L (ref 3.5–5.1)
Sodium: 138 mmol/L (ref 135–145)
TOTAL PROTEIN: 6.9 g/dL (ref 6.5–8.1)

## 2017-08-16 LAB — CBC
HCT: 46.5 % (ref 35.0–47.0)
Hemoglobin: 16.1 g/dL — ABNORMAL HIGH (ref 12.0–16.0)
MCH: 32.6 pg (ref 26.0–34.0)
MCHC: 34.7 g/dL (ref 32.0–36.0)
MCV: 94 fL (ref 80.0–100.0)
PLATELETS: 189 10*3/uL (ref 150–440)
RBC: 4.94 MIL/uL (ref 3.80–5.20)
RDW: 13.9 % (ref 11.5–14.5)
WBC: 6.7 10*3/uL (ref 3.6–11.0)

## 2017-08-16 LAB — SALICYLATE LEVEL: Salicylate Lvl: <7 mg/dL (ref 2.8–30.0)

## 2017-08-16 LAB — ETHANOL

## 2017-08-16 LAB — ACETAMINOPHEN LEVEL: Acetaminophen (Tylenol), Serum: 12 ug/mL (ref 10–30)

## 2017-08-16 LAB — URINE DRUG SCREEN, QUALITATIVE (ARMC ONLY)
Amphetamines, Ur Screen: NOT DETECTED
Barbiturates, Ur Screen: NOT DETECTED
Benzodiazepine, Ur Scrn: POSITIVE — AB
CANNABINOID 50 NG, UR ~~LOC~~: NOT DETECTED
Cocaine Metabolite,Ur ~~LOC~~: NOT DETECTED
MDMA (ECSTASY) UR SCREEN: NOT DETECTED
Methadone Scn, Ur: NOT DETECTED
Opiate, Ur Screen: POSITIVE — AB
Phencyclidine (PCP) Ur S: NOT DETECTED
Tricyclic, Ur Screen: NOT DETECTED

## 2017-08-16 NOTE — ED Notes (Signed)

## 2017-08-16 NOTE — ED Notes (Signed)
BEHAVIORAL HEALTH ROUNDING  Patient sleeping: No.  Patient alert and oriented: yes  Behavior appropriate: Yes. ; If no, describe:  Nutrition and fluids offered: Yes  Toileting and hygiene offered: Yes  Sitter present: not applicable, Q 15 min safety rounds and observation.  Law enforcement present: Yes ODS  

## 2017-08-16 NOTE — ED Triage Notes (Signed)
Pt arrived via POV with son with reports of increased depression, anxiety and suicidal ideation. Pt states she has had SI daily recently, but states she has had SI off and on for several months.    Pt is voluntary and states she would like inpatient treatment.  Pt states she see psychiatrist and is on medications but states they are not working.  Pt has had inpatient treatment years ago and has had SI attempts in the past.

## 2017-08-16 NOTE — ED Notes (Signed)
Pt reports increasing depression over the last few weeks. Today having SI but no plan. Asking to be admitted inpatient for help with her depression. Pt is calm and cooperative.

## 2017-08-16 NOTE — ED Notes (Signed)
Pt dressed out into appropriate behavioral health clothing. Pt dressed out with this tech and Sophia R.,RN in the rm. Pt belongings consist of pink shirt, blue/green pants and blue/gray tennis shoes. Cell phone was given to son. Pt calm and cooperative while dressing out.

## 2017-08-16 NOTE — ED Provider Notes (Signed)
[facility] Emergency Department Provider Note       Time seen: ----------------------------------------- 7:05 PM on 08/16/2017 -----------------------------------------   I have reviewed the triage vital signs and the nursing notes.  HISTORY   Chief Complaint Suicidal    HPI April Soto is a 57 y.o. female with a history of anemia, anxiety, asthma, bipolar disorder, depression, fibromyalgia, hyperlipidemia, hypertension who presents to the ED for increased depression, anxiety and suicidal ideation.  Patient states she has had suicidal ideation daily recently.  She feels like she would benefit from inpatient treatment.  She sees a psychiatrist and is on medications but the not working.  She denies any acute illness or medical complaint.  Past Medical History:  Diagnosis Date  . Anemia    "years ago"  . Anxiety    takes Ativan daily  . Asthma   . Bipolar 1 disorder (HCC)   . Chronic back pain    HNP  . Constipation    takes Ra [employer] daily  . Degenerative disc disease   . Depression    was on Prozac but taken off by MD for a couple of weeks  . Depression   . Fibromyalgia    takes Lyrica daily  . Genital herpes   . GERD (gastroesophageal reflux disease)    takes Nexium daily  . Headache(784.0)   . Heart murmur    "grew out of"  . History of bronchitis    last time 15+yrs ago  . History of rheumatic fever   . Hyperlipidemia    takes Simvastatin daily  . Hypertension    takes Coreg daily  . Hypothyroidism    takes Synthroid daily  . Joint pain   . Muscle spasm    takes Flexeril daily as needed  . Nocturia   . Osteoarthritis   . Peripheral edema    takes HCTZ daily  . Pneumonia    hx of;last time 30+yrs ago  . Raynaud's disease   . Rheumatic fever    as a child  . Seasonal allergies    takes Zyrtec daily and uses Dymista as well  . Stroke [facility])    ? 5 years ago. No lasting deficits  . Urinary incontinence   . Weakness    nmbness and tingling-right leg    Patient Active Problem List   Diagnosis Date Noted  . Weight loss 11/28/2015  . Polycythemia, secondary 11/27/2015  . Herniated nucleus pulposus, lumbar 04/13/2014  . Degenerative disc disease, lumbar 04/05/2014  . Right-sided low back pain with right-sided sciatica 04/05/2014  . Hypothyroidism 04/05/2014  . Hypokalemia 04/05/2014  . Asthma, chronic   . Back pain 04/02/2014  . Other specified examination 07/08/2013  . MRSA colonization 07/05/2013  . Rash and nonspecific skin eruption 07/05/2013  . Screening for STD (sexually transmitted disease) 07/05/2013    Past Surgical History:  Procedure Laterality Date  . ABDOMINAL HYSTERECTOMY    . BACK SURGERY    . CHOLECYSTECTOMY    . COLONOSCOPY    . ESOPHAGOGASTRODUODENOSCOPY    . FOOT SURGERY Right 12/2013   ankle, heel and top of foot  . LUMBAR LAMINECTOMY/DECOMPRESSION MICRODISCECTOMY Right 01/14/2013   Procedure: RIGHT LUMBAR FOUR FIVE  LAMINECTOMY/DECOMPRESSION MICRODISCECTOMY 1 LEVEL;  Surgeon: Reinaldo Meeker, MD;  Location: [facility]UR[facility]S;  Service: Neurosurgery;  Laterality: Right;  RIGHT L45 microdiskectomy  . OTHER SURGICAL HISTORY     Gastric stapling for weight loss. patient reports done in 1980's  .  SEPTOPLASTY    . TONSILLECTOMY    . TUBAL LIGATION      Allergies Patient has no known allergies.  Social History Social History   Tobacco Use  . Smoking status: Current Some Day Smoker    Packs/day: 0.50    Years: 40.00    Pack years: 20.00    Types: Cigarettes  . Smokeless tobacco: Never Used  . Tobacco comment: trying to quit as of 04/10/14  Substance Use Topics  . Alcohol use: No  . Drug use: No   Review of Systems Constitutional: Negative for fever. Cardiovascular: Negative for chest pain. Respiratory: Negative for shortness of breath. Gastrointestinal: Negative for abdominal pain, vomiting and diarrhea. Musculoskeletal: Negative for back pain. Skin: Negative for  rash. Neurological: Negative for headaches, focal weakness or numbness. Psychiatric: Positive for depression, anxiety and suicidal ideation  All systems negative/normal/unremarkable except as stated in the HPI  ____________________________________________   PHYSICAL EXAM:  VITAL SIGNS: ED Triage Vitals  Enc Vitals Group     BP 08/16/17 1831 112/73     Pulse Rate 08/16/17 1831 86     Resp 08/16/17 1831 18     Temp 08/16/17 1831 98.7 F (37.1 C)     Temp Source 08/16/17 1831 Oral     SpO2 08/16/17 1831 100 %     Weight 08/16/17 1832 235 lb (106.6 kg)     Height 08/16/17 1832  (1.676 m)     Head Circumference --      Peak Flow --      Pain Score 08/16/17 1844 8     Pain Loc --      Pain Edu? --      Excl. in GC? --    Constitutional: Alert and oriented. Well appearing and in no distress. Eyes: Conjunctivae are normal. Normal extraocular movements. ENT   Head: Normocephalic and atraumatic.   Nose: No congestion/rhinnorhea.   Mouth/Throat: Mucous membranes are moist.   Neck: No stridor. Cardiovascular: Normal rate, regular rhythm. No murmurs, rubs, or gallops. Respiratory: Normal respiratory effort without tachypnea nor retractions. Breath sounds are clear and equal bilaterally. No wheezes/rales/rhonchi. Gastrointestinal: Soft and nontender. Normal bowel sounds Musculoskeletal: Nontender with normal range of motion in extremities. No lower extremity tenderness nor edema. Neurologic:  Normal speech and language. No gross focal neurologic deficits are appreciated.  Skin:  Skin is warm, dry and intact. No rash noted. Psychiatric: Depressed mood and affect  ____________________________________________  ED COURSE:  As part of my medical decision making, I reviewed the following data within the electronic MEDICAL RECORD NUMBER History obtained from family if available, nursing notes, old chart and ekg, as well as notes from prior ED visits. Patient presented for  anxiety, depression and suicidal ideation, we will assess with labs and imaging as indicated at this time.   Procedures ____________________________________________   LABS (pertinent positives/negatives)  Labs Reviewed  CBC - Abnormal; Notable for the following components:      Result Value   Hemoglobin 16.1 (*)    All other components within normal limits  COMPREHENSIVE METABOLIC PANEL  ETHANOL  SALICYLATE LEVEL  ACETAMINOPHEN LEVEL  URINE DRUG SCREEN, QUALITATIVE ([facility] ONLY)   ____________________________________________  DIFFERENTIAL DIAGNOSIS   Anxiety, depression, suicidal ideation, medication noncompliance  FINAL ASSESSMENT AND PLAN  Depression, suicidal ideation   Plan: The patient had presented for depression, anxiety and suicidal ideation. Patient's labs are reassuring, she is medically stable for psychiatric evaluation and disposition.   Ulice Dash, MD  Note: This note was generated in part or whole with voice recognition software. Voice recognition is usually quite accurate but there are transcription errors that can and very often do occur. I apologize for any typographical errors that were not detected and corrected.     Emily Filbert, MD 08/16/17 [phone]

## 2017-08-16 NOTE — ED Notes (Signed)
Report given to SOC MD.  

## 2017-08-17 ENCOUNTER — Inpatient Hospital Stay (HOSPITAL_COMMUNITY)
Admission: AD | Admit: 2017-08-17 | Discharge: 2017-08-21 | DRG: 885 | Disposition: A | Discharge status: Home / Self Care | Payer: Medicare Other | Source: Intra-hospital | Attending: Psychiatry | Admitting: Psychiatry

## 2017-08-17 ENCOUNTER — Encounter (HOSPITAL_COMMUNITY): Payer: Self-pay | Admitting: *Deleted

## 2017-08-17 ENCOUNTER — Other Ambulatory Visit: Payer: Self-pay

## 2017-08-17 DIAGNOSIS — F17210 Nicotine dependence, cigarettes, uncomplicated: Secondary | ICD-10-CM | POA: Diagnosis present

## 2017-08-17 DIAGNOSIS — G4700 Insomnia, unspecified: Secondary | ICD-10-CM | POA: Diagnosis not present

## 2017-08-17 DIAGNOSIS — G479 Sleep disorder, unspecified: Secondary | ICD-10-CM | POA: Diagnosis present

## 2017-08-17 DIAGNOSIS — E785 Hyperlipidemia, unspecified: Secondary | ICD-10-CM | POA: Diagnosis present

## 2017-08-17 DIAGNOSIS — F329 Major depressive disorder, single episode, unspecified: Secondary | ICD-10-CM | POA: Diagnosis not present

## 2017-08-17 DIAGNOSIS — Z91048 Other nonmedicinal substance allergy status: Secondary | ICD-10-CM | POA: Diagnosis not present

## 2017-08-17 DIAGNOSIS — Z8249 Family history of ischemic heart disease and other diseases of the circulatory system: Secondary | ICD-10-CM | POA: Diagnosis not present

## 2017-08-17 DIAGNOSIS — F4010 Social phobia, unspecified: Secondary | ICD-10-CM | POA: Diagnosis not present

## 2017-08-17 DIAGNOSIS — A6000 Herpesviral infection of urogenital system, unspecified: Secondary | ICD-10-CM | POA: Diagnosis present

## 2017-08-17 DIAGNOSIS — F419 Anxiety disorder, unspecified: Secondary | ICD-10-CM | POA: Diagnosis present

## 2017-08-17 DIAGNOSIS — G8929 Other chronic pain: Secondary | ICD-10-CM | POA: Diagnosis present

## 2017-08-17 DIAGNOSIS — K219 Gastro-esophageal reflux disease without esophagitis: Secondary | ICD-10-CM | POA: Diagnosis present

## 2017-08-17 DIAGNOSIS — M1990 Unspecified osteoarthritis, unspecified site: Secondary | ICD-10-CM | POA: Diagnosis present

## 2017-08-17 DIAGNOSIS — I10 Essential (primary) hypertension: Secondary | ICD-10-CM | POA: Diagnosis present

## 2017-08-17 DIAGNOSIS — J45909 Unspecified asthma, uncomplicated: Secondary | ICD-10-CM | POA: Diagnosis present

## 2017-08-17 DIAGNOSIS — M797 Fibromyalgia: Secondary | ICD-10-CM | POA: Diagnosis present

## 2017-08-17 DIAGNOSIS — F332 Major depressive disorder, recurrent severe without psychotic features: Secondary | ICD-10-CM | POA: Diagnosis present

## 2017-08-17 DIAGNOSIS — Z8673 Personal history of transient ischemic attack (TIA), and cerebral infarction without residual deficits: Secondary | ICD-10-CM

## 2017-08-17 DIAGNOSIS — R45851 Suicidal ideations: Secondary | ICD-10-CM | POA: Diagnosis present

## 2017-08-17 DIAGNOSIS — Z22322 Carrier or suspected carrier of Methicillin resistant Staphylococcus aureus: Secondary | ICD-10-CM | POA: Diagnosis not present

## 2017-08-17 DIAGNOSIS — R450 Nervousness: Secondary | ICD-10-CM | POA: Diagnosis not present

## 2017-08-17 DIAGNOSIS — M549 Dorsalgia, unspecified: Secondary | ICD-10-CM | POA: Diagnosis present

## 2017-08-17 DIAGNOSIS — Z79890 Hormone replacement therapy (postmenopausal): Secondary | ICD-10-CM | POA: Diagnosis not present

## 2017-08-17 DIAGNOSIS — Z79899 Other long term (current) drug therapy: Secondary | ICD-10-CM

## 2017-08-17 DIAGNOSIS — E039 Hypothyroidism, unspecified: Secondary | ICD-10-CM | POA: Diagnosis present

## 2017-08-17 MED ORDER — LORATADINE 10 MG PO TABS
10.0000 mg | ORAL_TABLET | Freq: Every day | ORAL | Status: DC
  Administered 2017-08-17: 10 mg via ORAL
  Filled 2017-08-17: qty 1

## 2017-08-17 MED ORDER — PREGABALIN 75 MG PO CAPS
150.0000 mg | ORAL_CAPSULE | Freq: Three times a day (TID) | ORAL | Status: DC
  Administered 2017-08-17: 150 mg via ORAL
  Filled 2017-08-17: qty 2

## 2017-08-17 MED ORDER — MORPHINE-NALTREXONE 30-1.2 MG PO CPCR: 1.0000 | ORAL_CAPSULE | Freq: Two times a day (BID) | ORAL | Status: DC

## 2017-08-17 MED ORDER — LINACLOTIDE 145 MCG PO CAPS
145.0000 ug | ORAL_CAPSULE | Freq: Every day | ORAL | Status: DC | PRN
  Administered 2017-08-19: 145 ug via ORAL
  Filled 2017-08-17 (×3): qty 1

## 2017-08-17 MED ORDER — FLUTICASONE PROPIONATE 50 MCG/ACT NA SUSP
1.0000 | Freq: Every evening | NASAL | Status: DC | PRN
  Filled 2017-08-17: qty 16

## 2017-08-17 MED ORDER — ACYCLOVIR 200 MG PO CAPS
400.0000 mg | ORAL_CAPSULE | Freq: Every day | ORAL | Status: DC
  Administered 2017-08-17: 400 mg via ORAL
  Filled 2017-08-17 (×2): qty 2

## 2017-08-17 MED ORDER — HYDROCORTISONE 2.5 % RE CREA: 1.0000 "application " | TOPICAL_CREAM | RECTAL | Status: DC | PRN

## 2017-08-17 MED ORDER — HYDROXYZINE HCL 25 MG PO TABS
25.0000 mg | ORAL_TABLET | Freq: Four times a day (QID) | ORAL | Status: DC | PRN
Start: 2017-08-17 — End: 2017-08-21
  Administered 2017-08-17 – 2017-08-21 (×5): 25 mg via ORAL
  Filled 2017-08-17 (×5): qty 1

## 2017-08-17 MED ORDER — ALBUTEROL SULFATE HFA 108 (90 BASE) MCG/ACT IN AERS: 2.0000 | INHALATION_SPRAY | RESPIRATORY_TRACT | Status: DC | PRN

## 2017-08-17 MED ORDER — ROSUVASTATIN CALCIUM 20 MG PO TABS
40.0000 mg | ORAL_TABLET | Freq: Every day | ORAL | Status: DC
  Administered 2017-08-18 – 2017-08-20 (×3): 40 mg via ORAL
  Filled 2017-08-17: qty 1
  Filled 2017-08-17 (×4): qty 2
  Filled 2017-08-17: qty 1
  Filled 2017-08-17: qty 2

## 2017-08-17 MED ORDER — HYDROCORTISONE 2.5 % RE CREA
1.0000 "application " | TOPICAL_CREAM | RECTAL | Status: DC | PRN
  Filled 2017-08-17: qty 28.35

## 2017-08-17 MED ORDER — ACYCLOVIR 200 MG PO CAPS
400.0000 mg | ORAL_CAPSULE | Freq: Every day | ORAL | Status: DC
  Administered 2017-08-17 – 2017-08-20 (×4): 400 mg via ORAL
  Filled 2017-08-17 (×7): qty 2

## 2017-08-17 MED ORDER — TRAZODONE HCL 50 MG PO TABS
50.0000 mg | ORAL_TABLET | Freq: Every evening | ORAL | Status: DC | PRN
  Administered 2017-08-19 – 2017-08-20 (×2): 50 mg via ORAL
  Filled 2017-08-17 (×14): qty 1

## 2017-08-17 MED ORDER — OLOPATADINE HCL 0.1 % OP SOLN
1.0000 [drp] | Freq: Two times a day (BID) | OPHTHALMIC | Status: DC
  Administered 2017-08-17 (×2): 1 [drp] via OPHTHALMIC
  Filled 2017-08-17: qty 5

## 2017-08-17 MED ORDER — DICLOFENAC SODIUM 75 MG PO TBEC: 75.0000 mg | DELAYED_RELEASE_TABLET | Freq: Every day | ORAL | Status: DC | PRN

## 2017-08-17 MED ORDER — LEVOTHYROXINE SODIUM 112 MCG PO TABS
112.0000 ug | ORAL_TABLET | Freq: Every day | ORAL | Status: DC
  Administered 2017-08-17: 112 ug via ORAL
  Filled 2017-08-17: qty 1

## 2017-08-17 MED ORDER — VENLAFAXINE HCL ER 75 MG PO CP24
75.0000 mg | ORAL_CAPSULE | Freq: Every evening | ORAL | Status: DC
Start: 2017-08-17 — End: 2017-08-18
  Administered 2017-08-17: 75 mg via ORAL
  Filled 2017-08-17 (×3): qty 1

## 2017-08-17 MED ORDER — METOPROLOL SUCCINATE ER 50 MG PO TB24
50.0000 mg | ORAL_TABLET | Freq: Every day | ORAL | Status: DC
  Administered 2017-08-17: 50 mg via ORAL
  Filled 2017-08-17: qty 1

## 2017-08-17 MED ORDER — FLUTICASONE PROPIONATE 50 MCG/ACT NA SUSP: 1.0000 | Freq: Every evening | NASAL | Status: DC | PRN

## 2017-08-17 MED ORDER — LEVOTHYROXINE SODIUM 112 MCG PO TABS
112.0000 ug | ORAL_TABLET | Freq: Every day | ORAL | Status: DC
  Administered 2017-08-18 – 2017-08-21 (×4): 112 ug via ORAL
  Filled 2017-08-17 (×7): qty 1

## 2017-08-17 MED ORDER — ALUM & MAG HYDROXIDE-SIMETH 200-200-20 MG/5ML PO SUSP: 30.0000 mL | ORAL | Status: DC | PRN

## 2017-08-17 MED ORDER — MORPHINE SULFATE ER 15 MG PO TBCR
30.0000 mg | EXTENDED_RELEASE_TABLET | Freq: Two times a day (BID) | ORAL | Status: DC
  Administered 2017-08-17 – 2017-08-21 (×8): 30 mg via ORAL
  Filled 2017-08-17 (×8): qty 2

## 2017-08-17 MED ORDER — OLOPATADINE HCL 0.1 % OP SOLN
1.0000 [drp] | Freq: Two times a day (BID) | OPHTHALMIC | Status: DC
  Filled 2017-08-17 (×2): qty 5

## 2017-08-17 MED ORDER — VENLAFAXINE HCL ER 75 MG PO CP24: 75.0000 mg | ORAL_CAPSULE | Freq: Every evening | ORAL | Status: DC

## 2017-08-17 MED ORDER — LORAZEPAM 1 MG PO TABS
1.0000 mg | ORAL_TABLET | Freq: Three times a day (TID) | ORAL | Status: DC
  Administered 2017-08-17 – 2017-08-18 (×2): 1 mg via ORAL
  Filled 2017-08-17 (×2): qty 1

## 2017-08-17 MED ORDER — PANTOPRAZOLE SODIUM 40 MG PO TBEC
40.0000 mg | DELAYED_RELEASE_TABLET | Freq: Every day | ORAL | Status: DC
  Administered 2017-08-18 – 2017-08-21 (×4): 40 mg via ORAL
  Filled 2017-08-17 (×7): qty 1

## 2017-08-17 MED ORDER — LORATADINE 10 MG PO TABS
10.0000 mg | ORAL_TABLET | Freq: Every day | ORAL | Status: DC
  Administered 2017-08-18 – 2017-08-21 (×4): 10 mg via ORAL
  Filled 2017-08-17 (×7): qty 1

## 2017-08-17 MED ORDER — DICLOFENAC SODIUM 75 MG PO TBEC
75.0000 mg | DELAYED_RELEASE_TABLET | Freq: Every day | ORAL | Status: DC | PRN
  Administered 2017-08-17 – 2017-08-20 (×2): 75 mg via ORAL
  Filled 2017-08-17 (×2): qty 1

## 2017-08-17 MED ORDER — METOPROLOL SUCCINATE ER 50 MG PO TB24
50.0000 mg | ORAL_TABLET | Freq: Every day | ORAL | Status: DC
  Administered 2017-08-18 – 2017-08-20 (×3): 50 mg via ORAL
  Filled 2017-08-17 (×7): qty 1

## 2017-08-17 MED ORDER — LORAZEPAM 1 MG PO TABS
1.0000 mg | ORAL_TABLET | Freq: Three times a day (TID) | ORAL | Status: DC
  Administered 2017-08-17: 1 mg via ORAL
  Filled 2017-08-17: qty 1

## 2017-08-17 MED ORDER — LINACLOTIDE 145 MCG PO CAPS: 145.0000 ug | ORAL_CAPSULE | Freq: Every day | ORAL | Status: DC | PRN

## 2017-08-17 MED ORDER — MORPHINE SULFATE ER 15 MG PO TBCR
30.0000 mg | EXTENDED_RELEASE_TABLET | Freq: Two times a day (BID) | ORAL | Status: DC
Start: 2017-08-17 — End: 2017-08-17
  Administered 2017-08-17: 30 mg via ORAL
  Filled 2017-08-17: qty 2

## 2017-08-17 MED ORDER — ALBUTEROL SULFATE HFA 108 (90 BASE) MCG/ACT IN AERS
2.0000 | INHALATION_SPRAY | RESPIRATORY_TRACT | Status: DC | PRN
  Filled 2017-08-17: qty 6.7

## 2017-08-17 MED ORDER — ROSUVASTATIN CALCIUM 20 MG PO TABS
40.0000 mg | ORAL_TABLET | Freq: Every day | ORAL | Status: DC
  Administered 2017-08-17: 40 mg via ORAL
  Filled 2017-08-17: qty 2

## 2017-08-17 MED ORDER — PREGABALIN 75 MG PO CAPS
150.0000 mg | ORAL_CAPSULE | Freq: Three times a day (TID) | ORAL | Status: DC
  Administered 2017-08-17 – 2017-08-21 (×11): 150 mg via ORAL
  Filled 2017-08-17 (×12): qty 2

## 2017-08-17 MED ORDER — PANTOPRAZOLE SODIUM 40 MG PO TBEC
40.0000 mg | DELAYED_RELEASE_TABLET | Freq: Every day | ORAL | Status: DC
  Administered 2017-08-17: 40 mg via ORAL
  Filled 2017-08-17: qty 1

## 2017-08-17 NOTE — ED Provider Notes (Signed)
-----------------------------------------   7:01 AM on 08/17/2017 -----------------------------------------   Blood pressure 111/70, pulse 70, temperature 97.8 F (36.6 C), temperature source Oral, resp. rate 18, height  (1.676 m), weight 106.6 kg (235 lb), SpO2 98 %.  The patient had no acute events since last update.  Sleeping at this time.  Disposition is pending Psychiatry/Behavioral Medicine team recommendations.     Irean Hong, MD 08/17/17 [phone]

## 2017-08-17 NOTE — [facility] Group Notes (Signed)
[facility] Group Notes:  (Nursing/MHT/Case Management/Adjunct)  Date:  08/17/2017  Time:  1600  Type of Therapy:  Nurse Education   Participation Level:  Did Not Attend  Participation Quality:    Affect:    Cognitive:    Insight:    Engagement in Group:    Modes of Intervention:    Summary of Progress/Problems: Patient invited however did not attend group.  Lawrence Marseilles 08/17/2017, 4:50 PM

## 2017-08-17 NOTE — ED Notes (Signed)
Patient voiced understanding of discharge/readmit to [facility] hospital, Patient is calm and pleasant, all belongings given back to Patient. [facility] transported to [place].

## 2017-08-17 NOTE — ED Notes (Signed)
Nurse let Patient know that she had been accepted at [facility] and she was ok with transferring, she is compliant and no behavior issues noted, Patient took all morning medication and is safe, will continue to monitor.

## 2017-08-17 NOTE — ED Notes (Signed)
Received call from [facility] from [place], patient has been accepted, will be transferring to hall 400, to bed 2, accepting is Nestor Lewandowsky, and admitting is Dr. Jama Flavors, report can be called to [phone].

## 2017-08-17 NOTE — Tx Team (Signed)
Initial Treatment Plan 08/17/2017 4:12 PM April Soto ZOX:[id number]    PATIENT STRESSORS: Loss of mother x 5 years   PATIENT STRENGTHS: Ability for insight Average or above average intelligence Capable of independent living Communication skills Motivation for treatment/growth Supportive family/friends   PATIENT IDENTIFIED PROBLEMS: anxiety  Depression "I don't want to wake up crying"  Suicidal ideation "I don't want to do it"                 DISCHARGE CRITERIA:  Ability to meet basic life and health needs Improved stabilization in mood, thinking, and/or behavior Motivation to continue treatment in a less acute level of care Need for constant or close observation no longer present Reduction of life-threatening or endangering symptoms to within safe limits Safe-care adequate arrangements made Verbal commitment to aftercare and medication compliance  PRELIMINARY DISCHARGE PLAN: Outpatient therapy Return to previous living arrangement  PATIENT/FAMILY INVOLVEMENT: This treatment plan has been presented to and reviewed with the patient, April Soto, and/or family member .  The patient and family have been given the opportunity to ask questions and make suggestions.  Hoover Browns, RN 08/17/2017, 4:12 PM

## 2017-08-17 NOTE — ED Notes (Signed)

## 2017-08-17 NOTE — ED Notes (Signed)
Nurse talked with Patient and she states that she has been depressed all of her life, and that she had thoughts of shooting herself, she said her health is failing and she has no reason to be here, nurse listened and showed empathy, nurse will continue to monitor, q 15 minute checks and camera surveillance in progress for safety.
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
## 2017-08-17 NOTE — Plan of Care (Signed)
Pt educated on 15 minute checks, falls related to patient safety

## 2017-08-17 NOTE — ED Notes (Signed)
PT VOLUNTARY/SOC COMPLETE/PT PENDING PLACEMENT.

## 2017-08-17 NOTE — BH Assessment (Addendum)
Patient has been accepted to [facility].  Patient assigned to room 400 Bed 2 Accepting physician is Dr. Jama Flavors.  Attending physician is NP- Fransisca Kaufmann Call report to [phone].  Representative was Lindsay-AC.   ER Staff is aware of it:  Ms. Drinda Butts: ER Sect.;  Dr. Caryn Bee:  ER MD  Wendy:Patient's Nurse     Patient's Family/Support System Lear Ng (son) [phone]) have been updated as well.  Patient can arrive @ 1:00pm

## 2017-08-17 NOTE — ED Notes (Signed)
VOL /Accepted to [facility]/ Can arrive after 1pm/ [facility] called to transport

## 2017-08-17 NOTE — ED Notes (Signed)
Patient ate 100% of lunch and beverage. Patient is safe.

## 2017-08-17 NOTE — Plan of Care (Signed)
D: Pt  denies HI/AVH, passive SI-contract. Pt is pleasant and cooperative. Pt stayed in room most of the night .   A: Pt was offered support and encouragement. Pt was given scheduled medications. Pt was encourage to attend groups. Q 15 minute checks were done for safety.   R:Pt attends groups and interacts well with peers and staff. Pt is taking medication. Pt has no complaints.Pt receptive to treatment and safety maintained on unit.   Problem: Safety: Goal: Ability to disclose and discuss suicidal ideas will improve Outcome: Progressing   Problem: Self-Concept: Goal: Will verbalize positive feelings about self Outcome: Progressing

## 2017-08-17 NOTE — Progress Notes (Signed)
Patient ID: April Soto, female   DOB: 1961/01/07, 57 y.o.   MRN: [id number] Pt admitted to [facility] d/t increased depression, anxiety and suicidal ideation.  This is patient's first admission. Pt reported her mother passed 5 years ago and her depression and anxiety has been increasing.  Pt stated she was having thoughts of suicide with a plan to shoot herself prior to admission though she denied she had access to a gun.  Pt denied SI at the time of admission.  Pt states she does not take care of herself.  She has no interest in bathing/appearance or taking care of her teeth.  She stated if she didn't live with her grandchildren she probably wouldn't even see them.  She stated she feels like she has "lost her way" since the death of her mother.  Pt has difficulty with ambulation and uses a walker/wheelchair at home.  She has had back/foot surgery and is unable to walk long distance or stand for long periods of time.  Pt denied. HI and AVH.  Fifteen minute checks initiated for patient safety.  Pt oriented to unit.

## 2017-08-18 DIAGNOSIS — F332 Major depressive disorder, recurrent severe without psychotic features: Principal | ICD-10-CM

## 2017-08-18 DIAGNOSIS — F419 Anxiety disorder, unspecified: Secondary | ICD-10-CM

## 2017-08-18 DIAGNOSIS — F4010 Social phobia, unspecified: Secondary | ICD-10-CM

## 2017-08-18 DIAGNOSIS — G4700 Insomnia, unspecified: Secondary | ICD-10-CM

## 2017-08-18 DIAGNOSIS — R450 Nervousness: Secondary | ICD-10-CM

## 2017-08-18 DIAGNOSIS — R45851 Suicidal ideations: Secondary | ICD-10-CM

## 2017-08-18 DIAGNOSIS — F17210 Nicotine dependence, cigarettes, uncomplicated: Secondary | ICD-10-CM

## 2017-08-18 MED ORDER — VENLAFAXINE HCL ER 150 MG PO CP24
150.0000 mg | ORAL_CAPSULE | Freq: Every evening | ORAL | Status: DC
  Administered 2017-08-18 – 2017-08-20 (×3): 150 mg via ORAL
  Filled 2017-08-18 (×6): qty 1

## 2017-08-18 MED ORDER — ARIPIPRAZOLE 2 MG PO TABS
2.0000 mg | ORAL_TABLET | Freq: Every day | ORAL | Status: DC
  Administered 2017-08-18 – 2017-08-21 (×4): 2 mg via ORAL
  Filled 2017-08-18 (×6): qty 1

## 2017-08-18 MED ORDER — LORAZEPAM 1 MG PO TABS
1.0000 mg | ORAL_TABLET | Freq: Two times a day (BID) | ORAL | Status: DC
  Administered 2017-08-19 – 2017-08-20 (×4): 1 mg via ORAL
  Filled 2017-08-18 (×4): qty 1

## 2017-08-18 NOTE — BHH Group Notes (Signed)
LCSW Group Therapy Note 08/18/2017 2:59 PM  Type of Therapy/Topic: Group Therapy: Feelings about Diagnosis  Participation Level: Did Not Attend   Description of Group:  This group will allow patients to explore their thoughts and feelings about diagnoses they have received. Patients will be guided to explore their level of understanding and acceptance of these diagnoses. Facilitator will encourage patients to process their thoughts and feelings about the reactions of others to their diagnosis and will guide patients in identifying ways to discuss their diagnosis with significant others in their lives. This group will be process-oriented, with patients participating in exploration of their own experiences, giving and receiving support, and processing challenge from other group members.  Therapeutic Goals: 1. Patient will demonstrate understanding of diagnosis as evidenced by identifying two or more symptoms of the disorder 2. Patient will be able to express two feelings regarding the diagnosis 3. Patient will demonstrate their ability to communicate their needs through discussion and/or role play  Summary of Patient Progress:  Invited, chose not to attend.     Therapeutic Modalities:  Cognitive Behavioral Therapy Brief Therapy Feelings Identification    Seniya Stoffers Catalina Antigua Clinical Social Worker

## 2017-08-18 NOTE — Plan of Care (Signed)
D: Pt denies SI/HI/AVH. Pt is very pessimistic, appears to be very closed mined in her thinking" "I'm not getting the help I need". Pt was asked what help she needed she responded " I don't know I'm not a doctor". Then pt was informed that the doctor was doing things based on what she says and does. Pt appears focused on what she wants done and if it is not her way she appears to not care. Pt focused on " they took away my anxiety medicine". Pt was informed the reduced it from 3x a day to 2 x a day with Vistaril as needed. Pt is only focused on leaving . Pt stated she took 50 mg Trazodone in the past and it kept her up all night.   A: Pt was offered support and encouragement. Pt was given scheduled medications. Pt was encourage to attend groups. Q 15 minute checks were done for safety.   R: safety maintained on unit.   Problem: Self-Concept: Goal: Level of anxiety will decrease Outcome: Not Progressing   Problem: Coping: Goal: Coping ability will improve Outcome: Not Progressing   Problem: Safety: Goal: Ability to disclose and discuss suicidal ideas will improve Outcome: Progressing
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
## 2017-08-18 NOTE — H&P (Signed)
Psychiatric Admission Assessment Adult  Patient Identification: April Soto MRN:  [id number] Date of Evaluation:  08/18/2017 Chief Complaint:  MDD recurrent severe without psychotic features Principal Diagnosis: Major depressive disorder, recurrent, severe without psychosis Diagnosis:   Patient Active Problem List   Diagnosis Date Noted  . MDD (major depressive disorder), recurrent episode, severe ([place]) [F33.2] 08/17/2017    Priority: High  . Weight loss [R63.4] 11/28/2015  . 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 Herniated nucleus pulposus, lumbar [M51.26] 04/13/2014  . Degenerative disc disease, lumbar [M51.36] 04/05/2014  . Right-sided low back pain with right-sided sciatica [M54.41] 04/05/2014  . Hypothyroidism [E03.9] 04/05/2014  . Hypokalemia [E87.6] 04/05/2014  . Asthma, chronic [J45.909]   . Back pain [M54.9] 04/02/2014  . Other specified examination [Z01.89] 07/08/2013  . 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 Rash and nonspecific skin eruption [R21] 07/05/2013  . Screening for STD (sexually transmitted disease) [Z11.3] 07/05/2013   History of Present Illness:  On admission:  57 y.o. female. Ms. Burbage arrived to the ED by way of personal transportation by her son. She reports that she "I'm suicidal".  She reports that she has been feeling like this for "a couple of months".  She states that she has "been fighting depression" for her whole life.  She states that the feelings of suicide have been overwhelming.  She reports having a plan of "a gun or hanging myself".  She denied having access to weapons.  She reports that she had difficulty falling asleep.  She shared that when she does sleep, she has a hard time waking up. She denied changes in her appetite. She shared that she isolates from everyone but her son, and she has gotten to the point that she does not care about anything anymore.  She reports that she takes anxiety medications.  She denied having auditory or  visual hallucinations.  She denied homicidal ideation or intent.  She denied the use of alcohol or drugs.  She reports that she has been facing stress from being overwhelmed by stress. She states "I worry about everything".  Today on assessment, she reports a 10/10 depression with congruent affect, denies suicidal ideations, sleep was "pretty good", appetite is "poor."  She has been on Effexor for 3 years and feels it is no longer working, inquired about Abilify--will implement.    Associated Signs/Symptoms: Depression Symptoms:  depressed mood, fatigue, feelings of worthlessness/guilt, hopelessness, disturbed sleep, decreased appetite, (Hypo) Manic Symptoms:  none Anxiety Symptoms:  Excessive Worry, Social Anxiety, Psychotic Symptoms:  none PTSD Symptoms: NA Total Time spent with patient: 45 minutes  Past Psychiatric History: depression, anxiety  Is the patient at risk to self? Yes.    Has the patient been a risk to self in the past 6 months? Yes.    Has the patient been a risk to self within the distant past? Yes.    Is the patient a risk to others? No.  Has the patient been a risk to others in the past 6 months? No.  Has the patient been a risk to others within the distant past? No.   Prior Inpatient Therapy:  yes  Prior Outpatient Therapy:  yes  Alcohol Screening: 1. How often do you have a drink containing alcohol?: Never 2. How many drinks containing alcohol do you have on a typical day when you are drinking?: 1 or 2 3. How often do you have six or more drinks on one occasion?: Never AUDIT-C Score: 0 Intervention/Follow-up: AUDIT Score <7  follow-up not indicated Substance Abuse History in the last 12 months:  No. Consequences of Substance Abuse: Negative Previous Psychotropic Medications: No  Psychological Evaluations: Yes  Past Medical History:  Past Medical History:  Diagnosis Date  . Anemia    "years ago"  . Anxiety    takes Ativan daily  . Asthma   .  Bipolar 1 disorder ([place])   . Chronic back pain    HNP  . Constipation    takes [employer] daily  . Degenerative disc disease   . Depression    was on Prozac but taken off by MD for a couple of weeks  . Depression   . Fibromyalgia    takes Lyrica daily  . Genital herpes   . GERD (gastroesophageal reflux disease)    takes Nexium daily  . Headache(784.0)   . Heart murmur    "grew out of"  . History of bronchitis    last time 15+yrs ago  . History of rheumatic fever   . Hyperlipidemia    takes Simvastatin daily  . Hypertension    takes Coreg daily  . Hypothyroidism    takes Synthroid daily  . Joint pain   . Muscle spasm    takes Flexeril daily as needed  . Nocturia   . Osteoarthritis   . Peripheral edema    takes HCTZ daily  . Pneumonia    hx of;last time 30+yrs ago  . Raynaud's disease   . Rheumatic fever    as a child  . Seasonal allergies    takes Zyrtec daily and uses Dymista as well  . Stroke [facility])    ? 5 years ago. No lasting deficits  . Urinary incontinence   . Weakness    nmbness and tingling-right leg    Past Surgical History:  Procedure Laterality Date  . ABDOMINAL HYSTERECTOMY    . BACK SURGERY    . CHOLECYSTECTOMY    . COLONOSCOPY    . ESOPHAGOGASTRODUODENOSCOPY    . FOOT SURGERY Right 12/2013   ankle, heel and top of foot  . LUMBAR LAMINECTOMY/DECOMPRESSION MICRODISCECTOMY Right 01/14/2013   Procedure: RIGHT LUMBAR FOUR FIVE  LAMINECTOMY/DECOMPRESSION MICRODISCECTOMY 1 LEVEL;  Surgeon: Faythe Ghee, MD;  Location: [facility];  Service: Neurosurgery;  Laterality: Right;  RIGHT L45 microdiskectomy  . OTHER SURGICAL HISTORY     Gastric stapling for weight loss. patient reports done in 1980's  . SEPTOPLASTY    . TONSILLECTOMY    . TUBAL LIGATION     Family History:  Family History  Problem Relation Age of Onset  . Diabetes type II Mother   . Hypertension Mother   . Diabetes Mother   . Diabetes Sister   . Hypertension Sister   .  Hypertension Maternal Grandmother   . Diabetes Maternal Grandmother    Family Psychiatric  History: none Tobacco Screening: Have you used any form of tobacco in the last 30 days? (Cigarettes, Smokeless Tobacco, Cigars, and/or Pipes): Yes Tobacco use, Select all that apply: 5 or more cigarettes per day Are you interested in Tobacco Cessation Medications?: No, patient refused Counseled patient on smoking cessation including recognizing danger situations, developing coping skills and basic information about quitting provided: Yes Social History:  Social History   Substance and Sexual Activity  Alcohol Use No     Social History   Substance and Sexual Activity  Drug Use No    Additional Social History:   Allergies:  No Known Allergies Lab Results:  Results for orders placed or performed during the hospital encounter of 08/16/17 (from the past 48 hour(s))  Urine Drug Screen, Qualitative     Status: Abnormal   Collection Time: 08/16/17  6:40 PM  Result Value Ref Range   Tricyclic, Ur Screen NONE DETECTED NONE DETECTED   Amphetamines, Ur Screen NONE DETECTED NONE DETECTED   MDMA (Ecstasy)Ur Screen NONE DETECTED NONE DETECTED   Cocaine Metabolite,Ur [place] NONE DETECTED NONE DETECTED   Opiate, Ur Screen POSITIVE (A) NONE DETECTED   Phencyclidine (PCP) Ur S NONE DETECTED NONE DETECTED   Cannabinoid 50 Ng, Ur [place] NONE DETECTED NONE DETECTED   Barbiturates, Ur Screen NONE DETECTED NONE DETECTED   Benzodiazepine, Ur Scrn POSITIVE (A) NONE DETECTED   Methadone Scn, Ur NONE DETECTED NONE DETECTED    Comment: (NOTE) Tricyclics + metabolites, urine    Cutoff 1000 ng/mL Amphetamines + metabolites, urine  Cutoff 1000 ng/mL MDMA (Ecstasy), urine              Cutoff 500 ng/mL Cocaine Metabolite, urine          Cutoff 300 ng/mL Opiate + metabolites, urine        Cutoff 300 ng/mL Phencyclidine (PCP), urine         Cutoff 25 ng/mL Cannabinoid, urine                 Cutoff 50 ng/mL Barbiturates +  metabolites, urine  Cutoff 200 ng/mL Benzodiazepine, urine              Cutoff 200 ng/mL Methadone, urine                   Cutoff 300 ng/mL The urine drug screen provides only a preliminary, unconfirmed analytical test result and should not be used for non-medical purposes. Clinical consideration and professional judgment should be applied to any positive drug screen result due to possible interfering substances. A more specific alternate chemical method must be used in order to obtain a confirmed analytical result. Gas chromatography / mass spectrometry (GC/MS) is the preferred confirmat ory method. Performed at [facility], [address] [place], [place] [address]   Comprehensive metabolic panel     Status: Abnormal   Collection Time: 08/16/17  6:47 PM  Result Value Ref Range   Sodium 138 135 - 145 mmol/L   Potassium 4.2 3.5 - 5.1 mmol/L   Chloride 105 101 - 111 mmol/L   CO2 26 22 - 32 mmol/L   Glucose, Bld 137 (H) 65 - 99 mg/dL   BUN 8 6 - 20 mg/dL   Creatinine, Ser 0.55 0.44 - 1.00 mg/dL   Calcium 8.8 (L) 8.9 - 10.3 mg/dL   Total Protein 6.9 6.5 - 8.1 g/dL   Albumin 3.6 3.5 - 5.0 g/dL   AST 25 15 - 41 U/L   ALT 13 (L) 14 - 54 U/L   Alkaline Phosphatase 67 38 - 126 U/L   Total Bilirubin 0.6 0.3 - 1.2 mg/dL   GFR calc non Af Amer >60 >60 mL/min   GFR calc Af Amer >60 >60 mL/min    Comment: (NOTE) The eGFR has been calculated using the CKD EPI equation. This calculation has not been validated in all clinical situations. eGFR's persistently <60 mL/min signify possible Chronic Kidney Disease.    Anion gap 7 5 - 15    Comment: Performed at [facility], [address] [place],  [address]  Ethanol     Status: None  Collection Time: 08/16/17  6:47 PM  Result Value Ref Range   Alcohol, Ethyl (B) <10 <10 mg/dL    Comment:        LOWEST DETECTABLE LIMIT FOR SERUM ALCOHOL IS 10 mg/dL FOR MEDICAL PURPOSES ONLY Performed at [facility], [address] [place], [place] [address]   Salicylate level     Status: None   Collection Time: 08/16/17  6:47 PM  Result Value Ref Range   Salicylate Lvl <7.0 2.8 - 30.0 mg/dL    Comment: Performed at [facility], [address] [place], [place] [address]  Acetaminophen level     Status: None   Collection Time: 08/16/17  6:47 PM  Result Value Ref Range   Acetaminophen (Tylenol), Serum 12 10 - 30 ug/mL    Comment:        THERAPEUTIC CONCENTRATIONS VARY SIGNIFICANTLY. A RANGE OF 10-30 ug/mL MAY BE AN EFFECTIVE CONCENTRATION FOR MANY PATIENTS. HOWEVER, SOME ARE BEST TREATED AT CONCENTRATIONS OUTSIDE THIS RANGE. ACETAMINOPHEN CONCENTRATIONS >150 ug/mL AT 4 HOURS AFTER INGESTION AND >50 ug/mL AT 12 HOURS AFTER INGESTION ARE OFTEN ASSOCIATED WITH TOXIC REACTIONS. Performed at [facility], [address] [place], [place] [address]   cbc     Status: Abnormal   Collection Time: 08/16/17  6:47 PM  Result Value Ref Range   WBC 6.7 3.6 - 11.0 K/uL   RBC 4.94 3.80 - 5.20 MIL/uL   Hemoglobin 16.1 (H) 12.0 - 16.0 g/dL   HCT 46.5 35.0 - 47.0 %   MCV 94.0 80.0 - 100.0 fL   MCH 32.6 26.0 - 34.0 pg   MCHC 34.7 32.0 - 36.0 g/dL   RDW 13.9 11.5 - 14.5 %   Platelets 189 150 - 440 K/uL    Comment: Performed at [facility], [address], [place], [place] [address]    Blood Alcohol level:  Lab Results  Component Value Date   ETH <10 69/67/8938    Metabolic Disorder Labs:  No results found for: HGBA1C, MPG No results found for: PROLACTIN No results found for: CHOL, TRIG, HDL, CHOLHDL, VLDL, LDLCALC  Current Medications: Current Facility-Administered Medications  Medication Dose Route Frequency Provider Last Rate Last Dose  . acyclovir (ZOVIRAX) 200 MG capsule 400 mg  400 mg Oral QHS Elmarie Shiley A, NP   400 mg at 08/17/17 2258  . albuterol (PROVENTIL HFA;VENTOLIN HFA) 108 (90 Base) MCG/ACT inhaler 2 puff  2 puff Inhalation Q4H PRN Niel Hummer, NP      . alum & mag hydroxide-simeth (MAALOX/MYLANTA) 200-200-20 MG/5ML suspension 30 mL  30 mL Oral Q4H PRN Elmarie Shiley A, NP      . ARIPiprazole (ABILIFY) tablet 2 mg  2 mg Oral Daily Lord, Jamison Y, NP      . diclofenac (VOLTAREN) EC tablet 75 mg  75 mg Oral Daily PRN Elmarie Shiley A, NP   75 mg at 08/17/17 1750  . fluticasone (FLONASE) 50 MCG/ACT nasal spray 1 spray  1 spray Each Nare QHS PRN Niel Hummer, NP      . hydrocortisone (ANUSOL-HC) 2.5 % rectal cream 1 application  1 application Topical PRN Elmarie Shiley A, NP      . hydrOXYzine (ATARAX/VISTARIL) tablet 25 mg  25 mg Oral Q6H PRN Patriciaann Clan E, PA-C   25 mg at 08/17/17 2259  . levothyroxine (SYNTHROID, LEVOTHROID) tablet 112 mcg  112 mcg Oral QAC breakfast Niel Hummer, NP   112 mcg  at 08/18/17 7544  . linaclotide (LINZESS) capsule 145 mcg  145 mcg Oral Daily PRN Elmarie Shiley A, NP      . loratadine (CLARITIN) tablet 10 mg  10 mg Oral Daily Elmarie Shiley A, NP   10 mg at 08/18/17 1154  . [START ON 08/19/2017] LORazepam (ATIVAN) tablet 1 mg  1 mg Oral BID Patrecia Pour, NP      . metoprolol succinate (TOPROL-XL) 24 hr tablet 50 mg  50 mg Oral QHS Elmarie Shiley A, NP      . morphine (MS CONTIN) 12 hr tablet 30 mg  30 mg Oral Q12H Elmarie Shiley A, NP   30 mg at 08/18/17 1142  . olopatadine (PATANOL) 0.1 % ophthalmic solution 1 drop  1 drop Both Eyes BID Elmarie Shiley A, NP      . pantoprazole (PROTONIX) EC tablet 40 mg  40 mg Oral Daily Elmarie Shiley A, NP   40 mg at 08/18/17 1136  . pregabalin (LYRICA) capsule 150 mg  150 mg Oral TID Elmarie Shiley A, NP   150 mg at 08/18/17 1142  . rosuvastatin (CRESTOR) tablet 40 mg  40 mg Oral q1800 Elmarie Shiley A, NP      . traZODone (DESYREL) tablet 50 mg  50 mg Oral QHS,MR X 1 Simon, Spencer E, PA-C      . venlafaxine XR (EFFEXOR-XR) 24 hr capsule 150 mg  150 mg Oral QPM Sharma Covert, MD       PTA Medications: Medications Prior to Admission  Medication Sig Dispense Refill Last  Dose  . acyclovir (ZOVIRAX) 400 MG tablet Take 400 mg by mouth at bedtime.    08/15/2017 at Unknown time  . albuterol (PROAIR HFA) 108 (90 BASE) MCG/ACT inhaler Inhale 2 puffs into the lungs every 4 (four) hours as needed for wheezing or shortness of breath.    prn at prn  . cetirizine (ZYRTEC) 10 MG tablet Take 10 mg by mouth daily.    08/16/2017 at unknown time  . diclofenac (VOLTAREN) 75 MG EC tablet Take 75 mg by mouth daily as needed for mild pain or moderate pain.    prn at prn  . esomeprazole (NEXIUM) 40 MG capsule Take 40 mg by mouth daily.    08/15/2017 at Unknown time  . fluticasone (FLONASE) 50 MCG/ACT nasal spray Place 1 spray into both nostrils at bedtime as needed for allergies.    prn at prn  . hydrocortisone (ANUSOL-HC) 2.5 % rectal cream Apply 1 application topically as needed for hemorrhoids.    prn at prn  . levothyroxine (SYNTHROID, LEVOTHROID) 112 MCG tablet Take 112 mcg by mouth daily before breakfast.    08/16/2017 at Unknown time  . LINZESS 145 MCG CAPS capsule Take 145 mcg by mouth daily as needed (cramping).    prn at prn  . LORazepam (ATIVAN) 1 MG tablet Take 1 mg by mouth 3 (three) times daily.   08/16/2017 at Unknown time  . metoprolol succinate (TOPROL-XL) 50 MG 24 hr tablet Take 50 mg by mouth at bedtime.    08/15/2017 at Unknown time  . Morphine-Naltrexone (EMBEDA) 30-1.2 MG CPCR Take 1 capsule by mouth 2 (two) times daily.   08/16/2017 at Unknown time  . Olopatadine HCl (PATADAY) 0.2 % SOLN Place 1 drop into both eyes at bedtime as needed (allergies).   prn at prn  . oxyCODONE-acetaminophen (PERCOCET) 10-325 MG tablet Take 1 tablet by mouth every 6 (six) hours as needed for pain.  prn at prn  . oxyCODONE-acetaminophen (ROXICET) 5-325 MG tablet Take 1 tablet every 4 (four) hours as needed by mouth for severe pain. (Patient not taking: Reported on 08/17/2017) 20 tablet 0 Not Taking at Unknown time  . pregabalin (LYRICA) 150 MG capsule Take 150 mg by mouth 3 (three) times daily.    prn  at prn  . rosuvastatin (CRESTOR) 40 MG tablet Take 40 mg by mouth daily.   08/16/2017 at Unknown time  . venlafaxine XR (EFFEXOR-XR) 37.5 MG 24 hr capsule Take 75 mg by mouth every evening.    08/15/2017 at Unknown time    Musculoskeletal: Strength & Muscle Tone: within normal limits Gait & Station: normal Patient leans: N/A  Psychiatric Specialty Exam: Physical Exam  Constitutional: She is oriented to person, place, and time. She appears well-developed and well-nourished.  HENT:  Head: Normocephalic.  Neck: Normal range of motion.  Respiratory: Effort normal.  Musculoskeletal: Normal range of motion.  Neurological: She is alert and oriented to person, place, and time.  Psychiatric: Her speech is normal and behavior is normal. Judgment and thought content normal. Her mood appears anxious. Cognition and memory are normal. She exhibits a depressed mood.    Review of Systems  Psychiatric/Behavioral: Positive for depression. The patient is nervous/anxious.   All other systems reviewed and are negative.   Blood pressure 91/62, pulse 66, temperature 98.8 F (37.1 C), temperature source Oral, resp. rate 18, height 5' 4.5" (1.638 m), weight 106.6 kg (235 lb), SpO2 91 %.Body mass index is 39.71 kg/m.  General Appearance: Disheveled  Eye Contact:  Fair  Speech:  Normal Rate  Volume:  Decreased  Mood:  Anxious and Depressed  Affect:  Congruent  Thought Process:  Coherent and Descriptions of Associations: Intact  Orientation:  Full (Time, Place, and Person)  Thought Content:  Rumination  Suicidal Thoughts:  No  Homicidal Thoughts:  No  Memory:  Immediate;   Fair Recent;   Fair Remote;   Fair  Judgement:  Fair  Insight:  Fair  Psychomotor Activity:  Decreased  Concentration:  Concentration: Fair and Attention Span: Fair  Recall:  [employer] of Knowledge:  Fair  Language:  Good  Akathisia:  No  Handed:  Right  AIMS (if indicated):     Assets:  Leisure Time Resilience  ADL's:   Intact  Cognition:  WNL  Sleep:  Number of Hours: 6.75    Treatment Plan Summary: Daily contact with patient to assess and evaluate symptoms and progress in treatment, Medication management and Plan :  Major depressive disorder, recurrent, severe without psychosis: -Continue Effexor XR 150 mg daily for depression -Started Abilify 2 mg daily for depression  Anxiety -Decreased Ativan 1 mg TID to BID for anxiety -Started hydroxyzine 25 mg every six hours PRN anxiety  Insomnia -Start Trazodone 50 mg at bedtime PRN sleep  -Enroll in individual and group therapy -Discharge planning  Observation Level/Precautions:  15 minute checks  Laboratory:  completed in the ED, reviewed, stable  Psychotherapy:  Individual and group therapy  Medications:  See MAR  Consultations:  NOne  Discharge Concerns:  NOne  Estimated LOS:  5-7 days  Other:     Physician Treatment Plan for Primary Diagnosis:   Long Term Goal(s): Improvement in symptoms so as ready for discharge  Short Term Goals: Ability to identify changes in lifestyle to reduce recurrence of condition will improve, Ability to verbalize feelings will improve, Ability to disclose and discuss suicidal ideas, Ability to  demonstrate self-control will improve, Ability to identify and develop effective coping behaviors will improve, Ability to maintain clinical measurements within normal limits will improve and Compliance with prescribed medications will improve  Physician Treatment Plan for Secondary Diagnosis: Active Problems:   MDD (major depressive disorder), recurrent episode, severe ([place])  Long Term Goal(s): Improvement in symptoms so as ready for discharge  Short Term Goals: Ability to identify changes in lifestyle to reduce recurrence of condition will improve, Ability to verbalize feelings will improve, Ability to disclose and discuss suicidal ideas, Ability to demonstrate self-control will improve, Ability to identify and develop  effective coping behaviors will improve, Ability to maintain clinical measurements within normal limits will improve and Compliance with prescribed medications will improve  I certify that inpatient services furnished can reasonably be expected to improve the patient's condition.    Waylan Boga, NP 5/7/20192:07 PM

## 2017-08-18 NOTE — [facility] Group Notes (Signed)
Pt was invited but did not attend orientation/goals group. 

## 2017-08-18 NOTE — BHH Suicide Risk Assessment (Signed)
[facility] Admission Suicide Risk Assessment   Nursing information obtained from:    Demographic factors:    Current Mental Status:    Loss Factors:    Historical Factors:    Risk Reduction Factors:     Total Time spent with patient: 45 minutes Principal Problem: <principal problem not specified> Diagnosis:   Patient Active Problem List   Diagnosis Date Noted  . MDD (major depressive disorder), recurrent episode, severe (HCC) [F33.2] 08/17/2017  . Weight loss [R63.4] 11/28/2015  . 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 Herniated nucleus pulposus, lumbar [M51.26] 04/13/2014  . Degenerative disc disease, lumbar [M51.36] 04/05/2014  . Right-sided low back pain with right-sided sciatica [M54.41] 04/05/2014  . Hypothyroidism [E03.9] 04/05/2014  . Hypokalemia [E87.6] 04/05/2014  . Asthma, chronic [J45.909]   . Back pain [M54.9] 04/02/2014  . Other specified examination [Z01.89] 07/08/2013  . 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 Rash and nonspecific skin eruption [R21] 07/05/2013  . Screening for STD (sexually transmitted disease) [Z11.3] 07/05/2013   Subjective Data: Patient is seen and examined.  Patient is a 57 year old female with a past psychiatric history significant for bipolar disorder, depression, fibromyalgia, hyperlipidemia, anemia, asthma, anxiety, hypertension who presented to the [facility] emergency department with suicidal ideation.  April Soto denied any recent stressors, but stated that April Soto is felt depressed for many months now.  April Soto is followed by [facility] in [place].  April Soto is followed by Dr. Janeece Riggers.  April Soto stated April Soto had not seen him in at least 4 months.  April Soto admitted to helplessness, hopelessness and worthlessness.  April Soto stated April Soto is on Effexor.  April Soto stated April Soto gets upset because April Soto lives with her son and his family, and that "he is the head of the household".  April Soto stated that sometimes he "yells at me".  Review April Soto is tearful, helpless, hopeless  and worthless.  April Soto admitted to suicidal ideation.  April Soto was admitted to the hospital for evaluation and stabilization.  Continued Clinical Symptoms:    The "Alcohol Use Disorders Identification Test", Guidelines for Use in Primary Care, Second Edition.  World Science writer [facility]). Score between 0-7:  no or low risk or alcohol related problems. Score between 8-15:  moderate risk of alcohol related problems. Score between 16-19:  high risk of alcohol related problems. Score 20 or above:  warrants further diagnostic evaluation for alcohol dependence and treatment.   CLINICAL FACTORS:   Bipolar Disorder:   Depressive phase   Musculoskeletal: Strength & Muscle Tone: decreased Gait & Station: unable to stand Patient leans: N/A  Psychiatric Specialty Exam: Physical Exam  Nursing note and vitals reviewed. Constitutional: April Soto is oriented to person, place, and time. April Soto appears well-developed and well-nourished.  HENT:  Head: Normocephalic and atraumatic.  Respiratory: Effort normal.  Neurological: April Soto is alert and oriented to person, place, and time.    ROS  Blood pressure 91/62, pulse 66, temperature 98.8 F (37.1 C), temperature source Oral, resp. rate 18, height 5' 4.5" (1.638 m), weight 106.6 kg (235 lb), SpO2 91 %.Body mass index is 39.71 kg/m.  General Appearance: Disheveled  Eye Contact:  Minimal  Speech:  Normal Rate  Volume:  Decreased  Mood:  Depressed  Affect:  Congruent  Thought Process:  Coherent  Orientation:  Full (Time, Place, and Person)  Thought Content:  Illogical  Suicidal Thoughts:  Yes.  without intent/plan  Homicidal Thoughts:  No  Memory:  Immediate;   Fair  Judgement:  Impaired  Insight:  Lacking  Psychomotor  Activity:  Decreased  Concentration:  Concentration: Fair  Recall:  [employer] of Knowledge:  Fair  Language:  Fair  Akathisia:  No  Handed:  Right  AIMS (if indicated):     Assets:  Communication Skills Desire for  Improvement Resilience  ADL's:  Intact  Cognition:  WNL  Sleep:  Number of Hours: 6.75      COGNITIVE FEATURES THAT CONTRIBUTE TO RISK:  None    SUICIDE RISK:   Moderate:  Frequent suicidal ideation with limited intensity, and duration, some specificity in terms of plans, no associated intent, good self-control, limited dysphoria/symptomatology, some risk factors present, and identifiable protective factors, including available and accessible social support.  PLAN OF CARE: Patient is seen and examined.  Patient is a 57 year old female with a long-standing history reportedly for bipolar disorder, most recently depressed.  April Soto also has a history of anxiety.  We will increase her Effexor XR to 150 mg p.o. daily.  We will continue the rest of her psychiatric medications as is.  April Soto does have chronic pain problems and neuropathy.  April Soto is already on Lyrica, and we will have to see if April Soto is followed by pain clinic.  April Soto has a wheelchair in her room, and April Soto is been unable to ambulate.  April Soto states April Soto uses a walker at home.  We will get a physical therapy consult and a walker if at all possible.  The rest of her medications for her multiple medical problems will be continued.  April Soto will be encouraged to attend groups and work on her coping skills.  We will get old records from Dr. Berline Chough office given her status.  April Soto will meet with social work daily either in groups or individually.  We will work on her coping skills dealing with the home.  We will contact the family and get collateral information for her situation.  Her drug screen was positive for benzodiazepines, and April Soto takes Ativan chronically.  This will be continued.  I certify that inpatient services furnished can reasonably be expected to improve the patient's condition.   [name], MD 08/18/2017, 11:16 AM

## 2017-08-18 NOTE — Plan of Care (Signed)
  Problem: Coping: Goal: Ability to identify and develop effective coping behavior will improve Outcome: Progressing   

## 2017-08-18 NOTE — BHH Group Notes (Signed)
Pt did not attend wrap up group this evening. Pt stayed in their room instead 

## 2017-08-18 NOTE — Progress Notes (Signed)
Pt son called stating his mother was calling crying stating she wanted to leave. Writer explained to pt son that it would possibly be a good idea to schedule a family meeting to discuss concerns and questions .

## 2017-08-18 NOTE — Progress Notes (Signed)
Recreation Therapy Notes  Animal-Assisted Activity (AAA) Program Checklist/Progress Notes Patient Eligibility Criteria Checklist & Daily Group note for Rec Tx Intervention  Date: 5.7.19 Time: 1445 Location: [address]   AAA/T Program Assumption of Risk Form signed by Engineer, production or Parent Legal Guardian YES   Patient is free of allergies or sever asthma YES   Patient reports no fear of animals YES   Patient reports no history of cruelty to animals YES   Patient understands his/her participation is voluntary YES   Patient washes hands before animal contact YES   Patient washes hands after animal contact YES   Behavioral Response: Engaged  Education: Charity fundraiser, Appropriate Animal Interaction   Education Outcome: Acknowledges understanding/In group clarification offered/Needs additional education.   Clinical Observations/Feedback: Pt did not attend group.    Caroll Rancher, LRT/CTRS         Caroll Rancher A 08/18/2017 3:58 PM

## 2017-08-18 NOTE — Progress Notes (Signed)
D Pt is observed lying in her bed.Marland Kitchenasleep at 1100 this am. When pt awakens, ( at lunch) she is groggy, disoriented and confused" what time is it...where is everybody?". Pt observed in wheelchair, with pt scrubs on , she says she is embarassed about being around other people because of her poor hygiene. She is obese, smells of stale urine and B.O. And is  unsteady on her feet ( when she finagles herself to a standing position) . She says she has not been ambulatory for a very long time, that he sons avoid talking to her about it and says " I'll get my wife to help you" and then she doesn't want to impose on the daughter-in-was..swelling of the ankles she goes without bathing. Writer assisted  Pt into handicapped bathtub and she bathes and then pt assisted to put on clean hospital-supplied pt scubs, high falll risk yellow socks and assisted back to ehr bed. She does nto want to go to cafe for her meals and says " I really don't like to be around a lot of people"     A Pt denied having SI today and rated her depression, hopeLessness and anxeity " " 10/10/7", respectively. MD ordered PT consult ( to facilitate use of wheelchair). Pt back to bed as soon as she bathed and stated " ..I feel so much better". I wish I could take care of myself at home.      R Safety in pace. Pt contracts to not hurt herslef.

## 2017-08-19 MED ORDER — LORAZEPAM 2 MG/ML IJ SOLN
INTRAMUSCULAR | Status: AC
  Filled 2017-08-19: qty 1

## 2017-08-19 MED ORDER — DIPHENHYDRAMINE HCL 50 MG/ML IJ SOLN
INTRAMUSCULAR | Status: AC
  Filled 2017-08-19: qty 1

## 2017-08-19 MED ORDER — ZIPRASIDONE MESYLATE 20 MG IM SOLR
INTRAMUSCULAR | Status: AC
  Filled 2017-08-19: qty 20

## 2017-08-19 NOTE — Tx Team (Signed)
Interdisciplinary Treatment and Diagnostic Plan Update  08/19/2017 Time of Session: 0927 April Soto MRN: [id number]  Principal Diagnosis: <principal problem not specified>  Secondary Diagnoses: Active Problems:   MDD (major depressive disorder), recurrent episode, severe (HCC)   Current Medications:  Current Facility-Administered Medications  Medication Dose Route Frequency Provider Last Rate Last Dose  . acyclovir (ZOVIRAX) 200 MG capsule 400 mg  400 mg Oral QHS Fransisca Kaufmann A, NP   400 mg at 08/18/17 2101  . albuterol (PROVENTIL HFA;VENTOLIN HFA) 108 (90 Base) MCG/ACT inhaler 2 puff  2 puff Inhalation Q4H PRN Thermon Leyland, NP      . alum & mag hydroxide-simeth (MAALOX/MYLANTA) 200-200-20 MG/5ML suspension 30 mL  30 mL Oral Q4H PRN Fransisca Kaufmann A, NP      . ARIPiprazole (ABILIFY) tablet 2 mg  2 mg Oral Daily Charm Rings, NP   2 mg at 08/19/17 0819  . diclofenac (VOLTAREN) EC tablet 75 mg  75 mg Oral Daily PRN Fransisca Kaufmann A, NP   75 mg at 08/17/17 1750  . fluticasone (FLONASE) 50 MCG/ACT nasal spray 1 spray  1 spray Each Nare QHS PRN Thermon Leyland, NP      . hydrocortisone (ANUSOL-HC) 2.5 % rectal cream 1 application  1 application Topical PRN Fransisca Kaufmann A, NP      . hydrOXYzine (ATARAX/VISTARIL) tablet 25 mg  25 mg Oral Q6H PRN Donell Sievert E, PA-C   25 mg at 08/18/17 2101  . levothyroxine (SYNTHROID, LEVOTHROID) tablet 112 mcg  112 mcg Oral QAC breakfast Fransisca Kaufmann A, NP   112 mcg at 08/19/17 0644  . linaclotide (LINZESS) capsule 145 mcg  145 mcg Oral Daily PRN Fransisca Kaufmann A, NP   145 mcg at 08/19/17 1212  . loratadine (CLARITIN) tablet 10 mg  10 mg Oral Daily Fransisca Kaufmann A, NP   10 mg at 08/19/17 0818  . LORazepam (ATIVAN) tablet 1 mg  1 mg Oral BID Charm Rings, NP   1 mg at 08/19/17 0818  . metoprolol succinate (TOPROL-XL) 24 hr tablet 50 mg  50 mg Oral QHS Fransisca Kaufmann A, NP   50 mg at 08/18/17 2100  . morphine (MS CONTIN) 12 hr tablet 30 mg  30 mg Oral Q12H  Fransisca Kaufmann A, NP   30 mg at 08/19/17 0818  . olopatadine (PATANOL) 0.1 % ophthalmic solution 1 drop  1 drop Both Eyes BID Fransisca Kaufmann A, NP      . pantoprazole (PROTONIX) EC tablet 40 mg  40 mg Oral Daily Fransisca Kaufmann A, NP   40 mg at 08/19/17 0818  . pregabalin (LYRICA) capsule 150 mg  150 mg Oral TID Fransisca Kaufmann A, NP   150 mg at 08/19/17 1212  . rosuvastatin (CRESTOR) tablet 40 mg  40 mg Oral q1800 Fransisca Kaufmann A, NP   40 mg at 08/18/17 2136  . traZODone (DESYREL) tablet 50 mg  50 mg Oral QHS,MR X 1 Simon, Spencer E, PA-C      . venlafaxine XR (EFFEXOR-XR) 24 hr capsule 150 mg  150 mg Oral QPM [name], MD   150 mg at 08/18/17 1734   PTA Medications: Medications Prior to Admission  Medication Sig Dispense Refill Last Dose  . acyclovir (ZOVIRAX) 400 MG tablet Take 400 mg by mouth at bedtime.    08/15/2017 at Unknown time  . albuterol (PROAIR HFA) 108 (90 BASE) MCG/ACT inhaler Inhale 2 puffs into the lungs every 4 (four)  hours as needed for wheezing or shortness of breath.    prn at prn  . cetirizine (ZYRTEC) 10 MG tablet Take 10 mg by mouth daily.    08/16/2017 at unknown time  . diclofenac (VOLTAREN) 75 MG EC tablet Take 75 mg by mouth daily as needed for mild pain or moderate pain.    prn at prn  . esomeprazole (NEXIUM) 40 MG capsule Take 40 mg by mouth daily.    08/15/2017 at Unknown time  . fluticasone (FLONASE) 50 MCG/ACT nasal spray Place 1 spray into both nostrils at bedtime as needed for allergies.    prn at prn  . hydrocortisone (ANUSOL-HC) 2.5 % rectal cream Apply 1 application topically as needed for hemorrhoids.    prn at prn  . levothyroxine (SYNTHROID, LEVOTHROID) 112 MCG tablet Take 112 mcg by mouth daily before breakfast.    08/16/2017 at Unknown time  . LINZESS 145 MCG CAPS capsule Take 145 mcg by mouth daily as needed (cramping).    prn at prn  . LORazepam (ATIVAN) 1 MG tablet Take 1 mg by mouth 3 (three) times daily.   08/16/2017 at Unknown time  . metoprolol succinate  (TOPROL-XL) 50 MG 24 hr tablet Take 50 mg by mouth at bedtime.    08/15/2017 at Unknown time  . Morphine-Naltrexone (EMBEDA) 30-1.2 MG CPCR Take 1 capsule by mouth 2 (two) times daily.   08/16/2017 at Unknown time  . Olopatadine HCl (PATADAY) 0.2 % SOLN Place 1 drop into both eyes at bedtime as needed (allergies).   prn at prn  . oxyCODONE-acetaminophen (PERCOCET) 10-325 MG tablet Take 1 tablet by mouth every 6 (six) hours as needed for pain.   prn at prn  . oxyCODONE-acetaminophen (ROXICET) 5-325 MG tablet Take 1 tablet every 4 (four) hours as needed by mouth for severe pain. (Patient not taking: Reported on 08/17/2017) 20 tablet 0 Not Taking at Unknown time  . pregabalin (LYRICA) 150 MG capsule Take 150 mg by mouth 3 (three) times daily.    prn at prn  . rosuvastatin (CRESTOR) 40 MG tablet Take 40 mg by mouth daily.   08/16/2017 at Unknown time  . venlafaxine XR (EFFEXOR-XR) 37.5 MG 24 hr capsule Take 75 mg by mouth every evening.    08/15/2017 at Unknown time    Patient Stressors: Loss of mother x 5 years  Patient Strengths: Ability for insight Average or above average intelligence Capable of independent living Communication skills Motivation for treatment/growth Supportive family/friends  Treatment Modalities: Medication Management, Group therapy, Case management,  1 to 1 session with clinician, Psychoeducation, Recreational therapy.   Physician Treatment Plan for Primary Diagnosis: <principal problem not specified> Long Term Goal(s): Improvement in symptoms so as ready for discharge Improvement in symptoms so as ready for discharge   Short Term Goals: Ability to identify changes in lifestyle to reduce recurrence of condition will improve Ability to verbalize feelings will improve Ability to disclose and discuss suicidal ideas Ability to demonstrate self-control will improve Ability to identify and develop effective coping behaviors will improve Ability to maintain clinical measurements  within normal limits will improve Compliance with prescribed medications will improve Ability to identify changes in lifestyle to reduce recurrence of condition will improve Ability to verbalize feelings will improve Ability to disclose and discuss suicidal ideas Ability to demonstrate self-control will improve Ability to identify and develop effective coping behaviors will improve Ability to maintain clinical measurements within normal limits will improve Compliance with prescribed medications will improve  Medication Management: Evaluate patient's response, side effects, and tolerance of medication regimen.  Therapeutic Interventions: 1 to 1 sessions, Unit Group sessions and Medication administration.  Evaluation of Outcomes: Progressing  Physician Treatment Plan for Secondary Diagnosis: Active Problems:   MDD (major depressive disorder), recurrent episode, severe (HCC)  Long Term Goal(s): Improvement in symptoms so as ready for discharge Improvement in symptoms so as ready for discharge   Short Term Goals: Ability to identify changes in lifestyle to reduce recurrence of condition will improve Ability to verbalize feelings will improve Ability to disclose and discuss suicidal ideas Ability to demonstrate self-control will improve Ability to identify and develop effective coping behaviors will improve Ability to maintain clinical measurements within normal limits will improve Compliance with prescribed medications will improve Ability to identify changes in lifestyle to reduce recurrence of condition will improve Ability to verbalize feelings will improve Ability to disclose and discuss suicidal ideas Ability to demonstrate self-control will improve Ability to identify and develop effective coping behaviors will improve Ability to maintain clinical measurements within normal limits will improve Compliance with prescribed medications will improve     Medication Management:  Evaluate patient's response, side effects, and tolerance of medication regimen.  Therapeutic Interventions: 1 to 1 sessions, Unit Group sessions and Medication administration.  Evaluation of Outcomes: Progressing   RN Treatment Plan for Primary Diagnosis: <principal problem not specified> Long Term Goal(s): Knowledge of disease and therapeutic regimen to maintain health will improve  Short Term Goals: Ability to identify and develop effective coping behaviors will improve and Compliance with prescribed medications will improve  Medication Management: RN will administer medications as ordered by provider, will assess and evaluate patient's response and provide education to patient for prescribed medication. RN will report any adverse and/or side effects to prescribing provider.  Therapeutic Interventions: 1 on 1 counseling sessions, Psychoeducation, Medication administration, Evaluate responses to treatment, Monitor vital signs and CBGs as ordered, Perform/monitor CIWA, COWS, AIMS and Fall Risk screenings as ordered, Perform wound care treatments as ordered.  Evaluation of Outcomes: Progressing   LCSW Treatment Plan for Primary Diagnosis: <principal problem not specified> Long Term Goal(s): Safe transition to appropriate next level of care at discharge, Engage patient in therapeutic group addressing interpersonal concerns.  Short Term Goals: Engage patient in aftercare planning with referrals and resources, Increase social support and Increase skills for wellness and recovery  Therapeutic Interventions: Assess for all discharge needs, 1 to 1 time with Social worker, Explore available resources and support systems, Assess for adequacy in community support network, Educate family and significant other(s) on suicide prevention, Complete Psychosocial Assessment, Interpersonal group therapy.  Evaluation of Outcomes: Progressing   Progress in Treatment: Attending groups: No. Participating in  groups: No. Taking medication as prescribed: Yes. Toleration medication: Yes. Family/Significant other contact made: No, will contact:  daughter Patient understands diagnosis: Yes. Discussing patient identified problems/goals with staff: Yes. Medical problems stabilized or resolved: Yes. Denies suicidal/homicidal ideation: Yes. Issues/concerns per patient self-inventory: No. Other: none  New problem(s) identified: No, Describe:  none  New Short Term/Long Term Goal(s):Pt goal: to feel better, be able to handle stress.  Discharge Plan or Barriers:   Reason for Continuation of Hospitalization: Depression Medication stabilization  Estimated Length of Stay: 2-4 days.  Attendees: Patient: April Soto 08/19/2017   Physician: Dr Jola Babinski, MD 08/19/2017   Nursing: Liborio Nixon, RN 08/19/2017   RN Care Manager: 08/19/2017   Social Worker: Daleen Squibb, LCSW 08/19/2017   Recreational Therapist:  08/19/2017  Other:  08/19/2017   Other:  08/19/2017   Other: 08/19/2017        Scribe for Treatment Team: Lorri Frederick, LCSW 08/19/2017 1:00 PM

## 2017-08-19 NOTE — Progress Notes (Signed)
Recreation Therapy Notes  Date: 5.8.19 Time: 0930 Location: [address] Day[address]  Group Topic: Stress Management  Goal Area(s) Addresses:  Patient will verbalize importance of using healthy stress management.  Patient will identify positive emotions associated with healthy stress management.   Intervention: Stress Management  Activity :  Meditation.  LRT introduced the stress management technique of meditation.  LRT played Soto meditation that focused on the strength and resilience of mountains and how those characteristics can be used in daily life.  Patients were to follow along as meditation played.  Education:  Stress Management, Discharge Planning.   Education Outcome: Acknowledges edcuation/In group clarification offered/Needs additional education  Clinical Observations/Feedback: Pt did not attend group.      April Soto, LRT/CTRS         April Soto 08/19/2017 11:51 AM 

## 2017-08-19 NOTE — BHH Group Notes (Signed)
Adult Psychoeducational Group Note  Date:  08/19/2017 Time:  11:30 PM  Group Topic/Focus:  Wrap-Up Group:   The focus of this group is to help patients review their daily goal of treatment and discuss progress on daily workbooks.  Participation Level:  Active  Participation Quality:  Appropriate and Attentive  Affect:  Appropriate  Cognitive:  Alert and Appropriate  Insight: Appropriate and Good  Engagement in Group:  Engaged  Modes of Intervention:  Discussion and Education  Additional Comments:  Pt attended and participated in wrap up group this evening. Pt had a "really good" day, due to them going to meetings and getting a walker.   Chrisandra Netters 08/19/2017, 11:30 PM

## 2017-08-19 NOTE — Progress Notes (Signed)
Pt presents with a flat affect and depressed mood. Pt rates depression 8/10. Anxiety 5/10. Hopelessness 8/10. Pt denies SI/HI. Pt reports fair sleep at bedtime. Pt reports having a poor appetite today. Pt verbalized concerns regarding Ativan, and how she only received one dose yesterday. Pt expressed how some of her family members are  verbally aggressive towards her and how she shuts down and struggles with  standing up for herself. Pt stated goal today "listen and give feedback in group sessions and learn to give hope to others". Pt gait is unsteady and pt ambulates slowly in the hallway. Pt ordered a wheelchair and wheelchair observed in pt's room. Pt reports using wheelchair when needed.  Orders reviewed with pt. Verbal support provided. Pt encouraged to attend groups. 15 minute checks performed for safety. Pt compliant with tx plan.

## 2017-08-19 NOTE — Evaluation (Signed)
Physical Therapy Evaluation Patient Details Name: April Soto MRN: [id number] DOB: [birth year] Today's Date: 08/19/2017   History of Present Illness  Patient's 57 year old female with a past psychiatric history significant for bipolar disorder, depression, fibromyalgia, anxiety, anemia, asthma, hypertension, right ankle surgery, back surgery. Admitted 08/17/17 for suicide ideation, depression.  Clinical Impression  The patient reports dizziness with rolling, lying onto back and then sitting up. No noted nystagmus. Patient reports this history has been for some time. The patient was provided a RW which improved gait safety and stability and smaller socks for safety.  Pt admitted with above diagnosis. Pt currently with functional limitations due to the deficits listed below (see PT Problem List).  Pt will benefit from skilled PT to increase their independence and safety with mobility to allow discharge to the venue listed below.       Follow Up Recommendations Home health PT    Equipment Recommendations  None recommended by PT    Recommendations for Other Services       Precautions / Restrictions Precautions Precautions: Fall Precaution Comments: SI      Mobility  Bed Mobility Overal bed mobility: Independent             General bed mobility comments: reports getting dizzy to roll and lie on the back  Transfers Overall transfer level: Modified independent Equipment used: Rolling walker (2 wheeled);None Transfers: Sit to/from Stand Sit to Stand: Modified independent (Device/Increase time)         General transfer comment: pushes self up  Ambulation/Gait Ambulation/Gait assistance: Modified independent (Device/Increase time) Ambulation Distance (Feet): 150 Feet Assistive device: Rolling walker (2 wheeled);None Gait Pattern/deviations: Step-to pattern;Step-through pattern;Trunk flexed     General Gait Details: patient observed in hallway ambulating but relies on  rail. Ambulated with RW with improved stability  Stairs            Wheelchair Mobility    Modified Rankin (Stroke Patients Only)       Balance                                             Pertinent Vitals/Pain Pain Assessment: 0-10 Pain Score: 7  Pain Location: back Pain Descriptors / Indicators: Aching Pain Intervention(s): Premedicated before session;Monitored during session    Home Living Family/patient expects to be discharged to:: Private residence Living Arrangements: Children Available Help at Discharge: Family;Available 24 hours/day Type of Home: House         Home Equipment: Walker - 2 wheels;Cane - single point;Wheelchair - manual;Bedside commode Additional Comments: living with son    Prior Function Level of Independence: Independent with assistive device(s)               Hand Dominance        Extremity/Trunk Assessment   Upper Extremity Assessment Upper Extremity Assessment: Generalized weakness    Lower Extremity Assessment Lower Extremity Assessment: Generalized weakness;RLE deficits/detail RLE Deficits / Details: decreased ankle dorsiflexion( surgeical repair)    Cervical / Trunk Assessment Cervical / Trunk Assessment: Normal  Communication   Communication: No difficulties  Cognition Arousal/Alertness: Awake/alert Behavior During Therapy: WFL for tasks assessed/performed Overall Cognitive Status: Within Functional Limits for tasks assessed  General Comments      Exercises     Assessment/Plan    PT Assessment Patient needs continued PT services  PT Problem List Decreased strength;Decreased activity tolerance;Decreased balance;Decreased mobility;Decreased safety awareness;Decreased knowledge of precautions;Pain       PT Treatment Interventions DME instruction;Gait training;Functional mobility training;Therapeutic activities;Patient/family education     PT Goals (Current goals can be found in the Care Plan section)  Acute Rehab PT Goals Patient Stated Goal: to go home PT Goal Formulation: With patient Time For Goal Achievement: 09/02/17 Potential to Achieve Goals: Good    Frequency Min 2X/week   Barriers to discharge        Co-evaluation               AM-PAC PT "6 Clicks" Daily Activity  Outcome Measure Difficulty turning over in bed (including adjusting bedclothes, sheets and blankets)?: None Difficulty moving from lying on back to sitting on the side of the bed? : None Difficulty sitting down on and standing up from a chair with arms (e.g., wheelchair, bedside commode, etc,.)?: None Help needed moving to and from a bed to chair (including a wheelchair)?: None Help needed walking in hospital room?: A Little Help needed climbing 3-5 steps with a railing? : A Lot 6 Click Score: 21    End of Session Equipment Utilized During Treatment: Gait belt Activity Tolerance: Patient tolerated treatment well Patient left: in bed Nurse Communication: Mobility status PT Visit Diagnosis: Unsteadiness on feet (R26.81);Dizziness and giddiness (R42);History of falling (Z91.81)    Time: 0454-0981 PT Time Calculation (min) (ACUTE ONLY): 30 min   Charges:   PT Evaluation $PT Eval Low Complexity: 1 Low PT Treatments $Gait Training: 8-22 mins   PT G CodesBlanchard Kelch PT [phone]   Rada Hay 08/19/2017, 6:12 PM

## 2017-08-19 NOTE — Progress Notes (Signed)
[facility] MD Progress Note  08/19/2017 3:20 PM April Soto  MRN:  [id number] Subjective: Patient seen and examined.  Patient's 57 year old female with a past psychiatric history significant for bipolar disorder, depression, fibromyalgia, anxiety, anemia, asthma, hypertension.  She originally presented to the [facility] emergency department with suicidal ideation.  She states she feels little bit better today.  She is out of the wheelchair walking around.  Physical therapy is going to see her today.  She stated her suicidal ideation has decreased.  We had increased her Effexor yesterday.  She is tolerating that increase.  Her main interest is getting some therapy on learning how to communicate better with her son.  We discussed that extensively. Principal Problem: <principal problem not specified> Diagnosis:   Patient Active Problem List   Diagnosis Date Noted  . MDD (major depressive disorder), recurrent episode, severe (HCC) [F33.2] 08/17/2017  . Weight loss [R63.4] 11/28/2015  . 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 Herniated nucleus pulposus, lumbar [M51.26] 04/13/2014  . Degenerative disc disease, lumbar [M51.36] 04/05/2014  . Right-sided low back pain with right-sided sciatica [M54.41] 04/05/2014  . Hypothyroidism [E03.9] 04/05/2014  . Hypokalemia [E87.6] 04/05/2014  . Asthma, chronic [J45.909]   . Back pain [M54.9] 04/02/2014  . Other specified examination [Z01.89] 07/08/2013  . 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 Rash and nonspecific skin eruption [R21] 07/05/2013  . Screening for STD (sexually transmitted disease) [Z11.3] 07/05/2013   Total Time spent with patient: 20 minutes  Past Psychiatric History: See admission H&P  Past Medical History:  Past Medical History:  Diagnosis Date  . Anemia    "years ago"  . Anxiety    takes Ativan daily  . Asthma   . Bipolar 1 disorder (HCC)   . Chronic back pain    HNP  . Constipation    takes Ra [employer]  daily  . Degenerative disc disease   . Depression    was on Prozac but taken off by MD for a couple of weeks  . Depression   . Fibromyalgia    takes Lyrica daily  . Genital herpes   . GERD (gastroesophageal reflux disease)    takes Nexium daily  . Headache(784.0)   . Heart murmur    "grew out of"  . History of bronchitis    last time 15+yrs ago  . History of rheumatic fever   . Hyperlipidemia    takes Simvastatin daily  . Hypertension    takes Coreg daily  . Hypothyroidism    takes Synthroid daily  . Joint pain   . Muscle spasm    takes Flexeril daily as needed  . Nocturia   . Osteoarthritis   . Peripheral edema    takes HCTZ daily  . Pneumonia    hx of;last time 30+yrs ago  . Raynaud's disease   . Rheumatic fever    as a child  . Seasonal allergies    takes Zyrtec daily and uses Dymista as well  . Stroke [facility])    ? 5 years ago. No lasting deficits  . Urinary incontinence   . Weakness    nmbness and tingling-right leg    Past Surgical History:  Procedure Laterality Date  . ABDOMINAL HYSTERECTOMY    . BACK SURGERY    . CHOLECYSTECTOMY    . COLONOSCOPY    . ESOPHAGOGASTRODUODENOSCOPY    . FOOT SURGERY Right 12/2013   ankle, heel and top of foot  . LUMBAR LAMINECTOMY/DECOMPRESSION MICRODISCECTOMY Right 01/14/2013  Procedure: RIGHT LUMBAR FOUR FIVE  LAMINECTOMY/DECOMPRESSION MICRODISCECTOMY 1 LEVEL;  Surgeon: Reinaldo Meeker, MD;  Location: [facility];  Service: Neurosurgery;  Laterality: Right;  RIGHT L45 microdiskectomy  . OTHER SURGICAL HISTORY     Gastric stapling for weight loss. patient reports done in 1980's  . SEPTOPLASTY    . TONSILLECTOMY    . TUBAL LIGATION     Family History:  Family History  Problem Relation Age of Onset  . Diabetes type II Mother   . Hypertension Mother   . Diabetes Mother   . Diabetes Sister   . Hypertension Sister   . Hypertension Maternal Grandmother   . Diabetes Maternal Grandmother    Family Psychiatric  History:  See admission H&P Social History:  Social History   Substance and Sexual Activity  Alcohol Use No     Social History   Substance and Sexual Activity  Drug Use No    Social History   Socioeconomic History  . Marital status: Divorced    Spouse name: Not on file  . Number of children: Not on file  . Years of education: Not on file  . Highest education level: Not on file  Occupational History  . Not on file  Social Needs  . Financial resource strain: Not on file  . Food insecurity:    Worry: Not on file    Inability: Not on file  . Transportation needs:    Medical: Not on file    Non-medical: Not on file  Tobacco Use  . Smoking status: Current Some Day Smoker    Packs/day: 1.00    Years: 40.00    Pack years: 40.00    Types: Cigarettes  . Smokeless tobacco: Never Used  Substance and Sexual Activity  . Alcohol use: No  . Drug use: No  . Sexual activity: Not Currently    Birth control/protection: Surgical  Lifestyle  . Physical activity:    Days per week: Not on file    Minutes per session: Not on file  . Stress: Not on file  Relationships  . Social connections:    Talks on phone: Not on file    Gets together: Not on file    Attends religious service: Not on file    Active member of club or organization: Not on file    Attends meetings of clubs or organizations: Not on file    Relationship status: Not on file  Other Topics Concern  . Not on file  Social History Narrative  . Not on file   Additional Social History:                         Sleep: Fair  Appetite:  Fair  Current Medications: Current Facility-Administered Medications  Medication Dose Route Frequency Provider Last Rate Last Dose  . acyclovir (ZOVIRAX) 200 MG capsule 400 mg  400 mg Oral QHS Fransisca Kaufmann A, NP   400 mg at 08/18/17 2101  . albuterol (PROVENTIL HFA;VENTOLIN HFA) 108 (90 Base) MCG/ACT inhaler 2 puff  2 puff Inhalation Q4H PRN Thermon Leyland, NP      . alum & mag  hydroxide-simeth (MAALOX/MYLANTA) 200-200-20 MG/5ML suspension 30 mL  30 mL Oral Q4H PRN Fransisca Kaufmann A, NP      . ARIPiprazole (ABILIFY) tablet 2 mg  2 mg Oral Daily Charm Rings, NP   2 mg at 08/19/17 0819  . diclofenac (VOLTAREN) EC tablet 75 mg  75  mg Oral Daily PRN Fransisca Kaufmann A, NP   75 mg at 08/17/17 1750  . fluticasone (FLONASE) 50 MCG/ACT nasal spray 1 spray  1 spray Each Nare QHS PRN Thermon Leyland, NP      . hydrocortisone (ANUSOL-HC) 2.5 % rectal cream 1 application  1 application Topical PRN Fransisca Kaufmann A, NP      . hydrOXYzine (ATARAX/VISTARIL) tablet 25 mg  25 mg Oral Q6H PRN Donell Sievert E, PA-C   25 mg at 08/18/17 2101  . levothyroxine (SYNTHROID, LEVOTHROID) tablet 112 mcg  112 mcg Oral QAC breakfast Fransisca Kaufmann A, NP   112 mcg at 08/19/17 0644  . linaclotide (LINZESS) capsule 145 mcg  145 mcg Oral Daily PRN Fransisca Kaufmann A, NP   145 mcg at 08/19/17 1212  . loratadine (CLARITIN) tablet 10 mg  10 mg Oral Daily Fransisca Kaufmann A, NP   10 mg at 08/19/17 0818  . LORazepam (ATIVAN) tablet 1 mg  1 mg Oral BID Charm Rings, NP   1 mg at 08/19/17 0818  . metoprolol succinate (TOPROL-XL) 24 hr tablet 50 mg  50 mg Oral QHS Fransisca Kaufmann A, NP   50 mg at 08/18/17 2100  . morphine (MS CONTIN) 12 hr tablet 30 mg  30 mg Oral Q12H Fransisca Kaufmann A, NP   30 mg at 08/19/17 0818  . olopatadine (PATANOL) 0.1 % ophthalmic solution 1 drop  1 drop Both Eyes BID Fransisca Kaufmann A, NP      . pantoprazole (PROTONIX) EC tablet 40 mg  40 mg Oral Daily Fransisca Kaufmann A, NP   40 mg at 08/19/17 0818  . pregabalin (LYRICA) capsule 150 mg  150 mg Oral TID Fransisca Kaufmann A, NP   150 mg at 08/19/17 1212  . rosuvastatin (CRESTOR) tablet 40 mg  40 mg Oral q1800 Fransisca Kaufmann A, NP   40 mg at 08/18/17 2136  . traZODone (DESYREL) tablet 50 mg  50 mg Oral QHS,MR X 1 Simon, Spencer E, PA-C      . venlafaxine XR (EFFEXOR-XR) 24 hr capsule 150 mg  150 mg Oral QPM [name], MD   150 mg at 08/18/17 1734    Lab  Results: No results found for this or any previous visit (from the past 48 hour(s)).  Blood Alcohol level:  Lab Results  Component Value Date   ETH <10 08/16/2017    Metabolic Disorder Labs: No results found for: HGBA1C, MPG No results found for: PROLACTIN No results found for: CHOL, TRIG, HDL, CHOLHDL, VLDL, LDLCALC  Physical Findings: AIMS: Facial and Oral Movements Muscles of Facial Expression: None, normal Lips and Perioral Area: None, normal Jaw: None, normal Tongue: None, normal,Extremity Movements Upper (arms, wrists, hands, fingers): None, normal Lower (legs, knees, ankles, toes): None, normal, Trunk Movements Neck, shoulders, hips: None, normal, Overall Severity Severity of abnormal movements (highest score from questions above): None, normal Incapacitation due to abnormal movements: None, normal Patient's awareness of abnormal movements (rate only patient's report): No Awareness, Dental Status Current problems with teeth and/or dentures?: No Does patient usually wear dentures?: No  CIWA:    COWS:     Musculoskeletal: Strength & Muscle Tone: decreased Gait & Station: ataxic Patient leans: N/A  Psychiatric Specialty Exam: Physical Exam  Nursing note and vitals reviewed. Constitutional: She is oriented to person, place, and time. She appears well-developed and well-nourished.  HENT:  Head: Normocephalic.  Respiratory: Effort normal.  Neurological: She is alert and oriented to person,  place, and time.    ROS  Blood pressure 95/78, pulse 83, temperature 98.4 F (36.9 C), temperature source Oral, resp. rate 16, height 5' 4.5" (1.638 m), weight 106.6 kg (235 lb), SpO2 91 %.Body mass index is 39.71 kg/m.  General Appearance: Casual  Eye Contact:  Fair  Speech:  Normal Rate  Volume:  Normal  Mood:  Depressed  Affect:  Congruent  Thought Process:  Coherent  Orientation:  Full (Time, Place, and Person)  Thought Content:  Logical  Suicidal Thoughts:  Yes.   without intent/plan  Homicidal Thoughts:  No  Memory:  Immediate;   Fair  Judgement:  Intact  Insight:  Fair  Psychomotor Activity:  Normal  Concentration:  Concentration: Fair  Recall:  [employer] of Knowledge:  Fair  Language:  Fair  Akathisia:  Negative  Handed:  Right  AIMS (if indicated):     Assets:  Communication Skills Desire for Improvement Social Support  ADL's:  Intact  Cognition:  WNL  Sleep:  Number of Hours: 6.75     Treatment Plan Summary: Daily contact with patient to assess and evaluate symptoms and progress in treatment, Medication management and Plan Patient seen and examined.  Patient is a 57 year old female with the above-stated past psychiatric history seen in follow-up.  She is doing a little bit better today.  Her Effexor XR was increased yesterday.  No changes in her medications today.  We will continue to monitor for improvement.  Will continue 15-minute checks.  [name], MD 08/19/2017, 3:20 PM
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
## 2017-08-19 NOTE — [facility] Suicide Risk Assessment (Signed)
[facility] INPATIENT:  Family/Significant Other Suicide Prevention Education  Suicide Prevention Education:  Contact Attempts: Lear Ng, son,  [phone] been identified by the patient as the family member/significant other with whom the patient will be residing, and identified as the person(s) who will aid the patient in the event of a mental health crisis.  With written consent from the patient, two attempts were made to provide suicide prevention education, prior to and/or following the patient's discharge.  We were unsuccessful in providing suicide prevention education.  A suicide education pamphlet was given to the patient to share with family/significant other.  Date and time of first attempt:08/19/17, 38 Date and time of second attempt:  Lorri Frederick, LCSW 08/19/2017, 3:47 PM

## 2017-08-20 LAB — TSH: TSH: 0.329 u[IU]/mL — AB (ref 0.350–4.500)

## 2017-08-20 MED ORDER — TIZANIDINE HCL 2 MG PO TABS
2.0000 mg | ORAL_TABLET | Freq: Four times a day (QID) | ORAL | Status: DC | PRN
  Administered 2017-08-20: 2 mg via ORAL
  Filled 2017-08-20: qty 1

## 2017-08-20 NOTE — [facility] Group Notes (Signed)
[facility] LCSW Group Therapy Note  Date/Time: 08/20/17, 1315  Type of Therapy/Topic:  Group Therapy:  Balance in Life  Participation Level:  Did not attend  Description of Group:    This group will address the concept of balance and how it feels and looks when one is unbalanced. Patients will be encouraged to process areas in their lives that are out of balance, and identify reasons for remaining unbalanced. Facilitators will guide patients utilizing problem- solving interventions to address and correct the stressor making their life unbalanced. Understanding and applying boundaries will be explored and addressed for obtaining  and maintaining a balanced life. Patients will be encouraged to explore ways to assertively make their unbalanced needs known to significant others in their lives, using other group members and facilitator for support and feedback.  Therapeutic Goals: 1. Patient will identify two or more emotions or situations they have that consume much of in their lives. 2. Patient will identify signs/triggers that life has become out of balance:  3. Patient will identify two ways to set boundaries in order to achieve balance in their lives:  4. Patient will demonstrate ability to communicate their needs through discussion and/or role plays  Summary of Patient Progress:          Therapeutic Modalities:   Cognitive Behavioral Therapy Solution-Focused Therapy Assertiveness Training  Greg Marcel Gary, LCSW 

## 2017-08-20 NOTE — Progress Notes (Signed)
[facility] MD Progress Note  08/20/2017 12:43 PM April Soto  MRN:  [id number] Subjective: Patient is seen and examined.  Patient is a 57 year old female with a past psychiatric history significant for bipolar disorder, depression, fibromyalgia, anxiety, anemia, asthma and hypertension.  She is seen in follow-up.  She stated her mood was doing better but she is having more pain today.  She was moving around a lot today with her walker versus a wheelchair, and that is probably what caused the pain.  We discussed today potential discharge tomorrow.  She denied any suicidal ideation, she denied any side effects to her current medications. Principal Problem: <principal problem not specified> Diagnosis:   Patient Active Problem List   Diagnosis Date Noted  . MDD (major depressive disorder), recurrent episode, severe (HCC) [F33.2] 08/17/2017  . Weight loss [R63.4] 11/28/2015  . 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 Herniated nucleus pulposus, lumbar [M51.26] 04/13/2014  . Degenerative disc disease, lumbar [M51.36] 04/05/2014  . Right-sided low back pain with right-sided sciatica [M54.41] 04/05/2014  . Hypothyroidism [E03.9] 04/05/2014  . Hypokalemia [E87.6] 04/05/2014  . Asthma, chronic [J45.909]   . Back pain [M54.9] 04/02/2014  . Other specified examination [Z01.89] 07/08/2013  . 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 Rash and nonspecific skin eruption [R21] 07/05/2013  . Screening for STD (sexually transmitted disease) [Z11.3] 07/05/2013   Total Time spent with patient: 20 minutes  Past Psychiatric History: See admission H&P  Past Medical History:  Past Medical History:  Diagnosis Date  . Anemia    "years ago"  . Anxiety    takes Ativan daily  . Asthma   . Bipolar 1 disorder (HCC)   . Chronic back pain    HNP  . Constipation    takes Ra [employer] daily  . Degenerative disc disease   . Depression    was on Prozac but taken off by MD for a couple of weeks  . Depression    . Fibromyalgia    takes Lyrica daily  . Genital herpes   . GERD (gastroesophageal reflux disease)    takes Nexium daily  . Headache(784.0)   . Heart murmur    "grew out of"  . History of bronchitis    last time 15+yrs ago  . History of rheumatic fever   . Hyperlipidemia    takes Simvastatin daily  . Hypertension    takes Coreg daily  . Hypothyroidism    takes Synthroid daily  . Joint pain   . Muscle spasm    takes Flexeril daily as needed  . Nocturia   . Osteoarthritis   . Peripheral edema    takes HCTZ daily  . Pneumonia    hx of;last time 30+yrs ago  . Raynaud's disease   . Rheumatic fever    as a child  . Seasonal allergies    takes Zyrtec daily and uses Dymista as well  . Stroke [facility])    ? 5 years ago. No lasting deficits  . Urinary incontinence   . Weakness    nmbness and tingling-right leg    Past Surgical History:  Procedure Laterality Date  . ABDOMINAL HYSTERECTOMY    . BACK SURGERY    . CHOLECYSTECTOMY    . COLONOSCOPY    . ESOPHAGOGASTRODUODENOSCOPY    . FOOT SURGERY Right 12/2013   ankle, heel and top of foot  . LUMBAR LAMINECTOMY/DECOMPRESSION MICRODISCECTOMY Right 01/14/2013   Procedure: RIGHT LUMBAR FOUR FIVE  LAMINECTOMY/DECOMPRESSION MICRODISCECTOMY 1 LEVEL;  Surgeon: Harvie Heck  Sonda Primes, MD;  Location: [facility];  Service: Neurosurgery;  Laterality: Right;  RIGHT L45 microdiskectomy  . OTHER SURGICAL HISTORY     Gastric stapling for weight loss. patient reports done in 1980's  . SEPTOPLASTY    . TONSILLECTOMY    . TUBAL LIGATION     Family History:  Family History  Problem Relation Age of Onset  . Diabetes type II Mother   . Hypertension Mother   . Diabetes Mother   . Diabetes Sister   . Hypertension Sister   . Hypertension Maternal Grandmother   . Diabetes Maternal Grandmother    Family Psychiatric  History: See admission H&P Social History:  Social History   Substance and Sexual Activity  Alcohol Use No     Social History    Substance and Sexual Activity  Drug Use No    Social History   Socioeconomic History  . Marital status: Divorced    Spouse name: Not on file  . Number of children: Not on file  . Years of education: Not on file  . Highest education level: Not on file  Occupational History  . Not on file  Social Needs  . Financial resource strain: Not on file  . Food insecurity:    Worry: Not on file    Inability: Not on file  . Transportation needs:    Medical: Not on file    Non-medical: Not on file  Tobacco Use  . Smoking status: Current Some Day Smoker    Packs/day: 1.00    Years: 40.00    Pack years: 40.00    Types: Cigarettes  . Smokeless tobacco: Never Used  Substance and Sexual Activity  . Alcohol use: No  . Drug use: No  . Sexual activity: Not Currently    Birth control/protection: Surgical  Lifestyle  . Physical activity:    Days per week: Not on file    Minutes per session: Not on file  . Stress: Not on file  Relationships  . Social connections:    Talks on phone: Not on file    Gets together: Not on file    Attends religious service: Not on file    Active member of club or organization: Not on file    Attends meetings of clubs or organizations: Not on file    Relationship status: Not on file  Other Topics Concern  . Not on file  Social History Narrative  . Not on file   Additional Social History:                         Sleep: Fair  Appetite:  Good  Current Medications: Current Facility-Administered Medications  Medication Dose Route Frequency Provider Last Rate Last Dose  . acyclovir (ZOVIRAX) 200 MG capsule 400 mg  400 mg Oral QHS Fransisca Kaufmann A, NP   400 mg at 08/19/17 2115  . albuterol (PROVENTIL HFA;VENTOLIN HFA) 108 (90 Base) MCG/ACT inhaler 2 puff  2 puff Inhalation Q4H PRN Thermon Leyland, NP      . alum & mag hydroxide-simeth (MAALOX/MYLANTA) 200-200-20 MG/5ML suspension 30 mL  30 mL Oral Q4H PRN Fransisca Kaufmann A, NP      . ARIPiprazole  (ABILIFY) tablet 2 mg  2 mg Oral Daily Charm Rings, NP   2 mg at 08/20/17 0809  . diclofenac (VOLTAREN) EC tablet 75 mg  75 mg Oral Daily PRN Fransisca Kaufmann A, NP   75 mg at  08/20/17 0914  . fluticasone (FLONASE) 50 MCG/ACT nasal spray 1 spray  1 spray Each Nare QHS PRN Thermon Leyland, NP      . hydrocortisone (ANUSOL-HC) 2.5 % rectal cream 1 application  1 application Topical PRN Fransisca Kaufmann A, NP      . hydrOXYzine (ATARAX/VISTARIL) tablet 25 mg  25 mg Oral Q6H PRN Donell Sievert E, PA-C   25 mg at 08/19/17 2115  . levothyroxine (SYNTHROID, LEVOTHROID) tablet 112 mcg  112 mcg Oral QAC breakfast Fransisca Kaufmann A, NP   112 mcg at 08/20/17 1610  . linaclotide (LINZESS) capsule 145 mcg  145 mcg Oral Daily PRN Fransisca Kaufmann A, NP   145 mcg at 08/19/17 1212  . loratadine (CLARITIN) tablet 10 mg  10 mg Oral Daily Fransisca Kaufmann A, NP   10 mg at 08/20/17 0809  . LORazepam (ATIVAN) tablet 1 mg  1 mg Oral BID Charm Rings, NP   1 mg at 08/20/17 0809  . metoprolol succinate (TOPROL-XL) 24 hr tablet 50 mg  50 mg Oral QHS Fransisca Kaufmann A, NP   50 mg at 08/19/17 2114  . morphine (MS CONTIN) 12 hr tablet 30 mg  30 mg Oral Q12H Fransisca Kaufmann A, NP   30 mg at 08/20/17 0809  . olopatadine (PATANOL) 0.1 % ophthalmic solution 1 drop  1 drop Both Eyes BID Fransisca Kaufmann A, NP      . pantoprazole (PROTONIX) EC tablet 40 mg  40 mg Oral Daily Fransisca Kaufmann A, NP   40 mg at 08/20/17 0809  . pregabalin (LYRICA) capsule 150 mg  150 mg Oral TID Fransisca Kaufmann A, NP   150 mg at 08/20/17 1221  . rosuvastatin (CRESTOR) tablet 40 mg  40 mg Oral q1800 Fransisca Kaufmann A, NP   40 mg at 08/19/17 1708  . traZODone (DESYREL) tablet 50 mg  50 mg Oral QHS,MR X 1 Kerry Hough, PA-C   50 mg at 08/19/17 2114  . venlafaxine XR (EFFEXOR-XR) 24 hr capsule 150 mg  150 mg Oral QPM [name], MD   150 mg at 08/19/17 1708    Lab Results: No results found for this or any previous visit (from the past 48 hour(s)).  Blood Alcohol level:   Lab Results  Component Value Date   ETH <10 08/16/2017    Metabolic Disorder Labs: No results found for: HGBA1C, MPG No results found for: PROLACTIN No results found for: CHOL, TRIG, HDL, CHOLHDL, VLDL, LDLCALC  Physical Findings: AIMS: Facial and Oral Movements Muscles of Facial Expression: None, normal Lips and Perioral Area: None, normal Jaw: None, normal Tongue: None, normal,Extremity Movements Upper (arms, wrists, hands, fingers): None, normal Lower (legs, knees, ankles, toes): None, normal, Trunk Movements Neck, shoulders, hips: None, normal, Overall Severity Severity of abnormal movements (highest score from questions above): None, normal Incapacitation due to abnormal movements: None, normal Patient's awareness of abnormal movements (rate only patient's report): No Awareness, Dental Status Current problems with teeth and/or dentures?: No Does patient usually wear dentures?: No  CIWA:    COWS:     Musculoskeletal: Strength & Muscle Tone: decreased Gait & Station: unsteady Patient leans: N/A  Psychiatric Specialty Exam: Physical Exam  Nursing note and vitals reviewed. Constitutional: She is oriented to person, place, and time. She appears well-developed and well-nourished.  HENT:  Head: Normocephalic and atraumatic.  Respiratory: Effort normal.  Neurological: She is alert and oriented to person, place, and time.    ROS  Blood pressure 105/78, pulse 86, temperature 98 F (36.7 C), temperature source Oral, resp. rate 16, height 5' 4.5" (1.638 m), weight 106.6 kg (235 lb), SpO2 91 %.Body mass index is 39.71 kg/m.  General Appearance: Casual  Eye Contact:  Fair  Speech:  Normal Rate  Volume:  Normal  Mood:  Anxious  Affect:  Appropriate  Thought Process:  Coherent  Orientation:  Full (Time, Place, and Person)  Thought Content:  Logical  Suicidal Thoughts:  No  Homicidal Thoughts:  No  Memory:  Immediate;   Fair  Judgement:  Intact  Insight:  Fair   Psychomotor Activity:  Decreased  Concentration:  Concentration: Fair  Recall:  [employer] of Knowledge:  Fair  Language:  Fair  Akathisia:  No  Handed:  Right  AIMS (if indicated):     Assets:  Desire for Improvement Financial Resources/Insurance Housing Resilience Social Support  ADL's:  Intact  Cognition:  WNL  Sleep:  Number of Hours: 6.75     Treatment Plan Summary: Daily contact with patient to assess and evaluate symptoms and progress in treatment, Medication management and Plan Patient is seen and examined.  Patient is a 57 year old female with the above-stated past psychiatric history seen in follow-up.  Her mood continues to improve.  I am not going to change any of her psychiatric medications today.  She is having increased back pain from probably more ambulation.  Emina put some muscle relaxers on board for her.  We will try Zanaflex that 2 to 4 mg p.o. every 8 hours as needed for pain.  Hopefully this will be beneficial.  If she continues to improve we can look at potential discharge tomorrow.  [name], MD 08/20/2017, 12:43 PM

## 2017-08-20 NOTE — Progress Notes (Signed)
Patient ID: April Soto, female   DOB: 1960/05/24, 57 y.o.   MRN: [id number] Pt with flat and sad affect endorsed moderate anxiety, depression and severe back pain; "my back pain is about a 7"-see MAR. Pt who is a high fall risk is wearing a pair of yellow non-skid socks and yellow wristband. Pt denied SI, HI or AVH. Medications offered as prescribed. All patient's questions and concerns addressed. Support, encouragement, and safe environment provided. Will continue to monitor for any changes. 15-minute safety checks continue. Pt attended wrap-up group. Pt was med compliant.

## 2017-08-20 NOTE — Progress Notes (Signed)
Adult Psychoeducational Group Note  Date:  08/20/2017 Time:  2:07 PM  Group Topic/Focus:  Goals Group:   The focus of this group is to help patients establish daily goals to achieve during treatment and discuss how the patient can incorporate goal setting into their daily lives to aide in recovery.  Participation Level:  Active  Participation Quality:  Appropriate  Affect:  Appropriate  Cognitive:  Alert  Insight: Appropriate  Engagement in Group:  Engaged  Modes of Intervention:  Discussion  Additional Comments:  Pt attended group and participated in group discussion.  Kaidin Boehle R Lexander Tremblay 08/20/2017, 2:07 PM

## 2017-08-20 NOTE — Progress Notes (Signed)
Pt presents with a flat affect and depressed mood. Pt rates depression 7/10. Anxiety 5/10. Hopelessness 7/10. Pt denies SI/HI. Pt expressed lacking motivation to perform ADL's . Pt verbalized that she have a hard time wanting to bathe, put on clothes or go to meals. Pt verbalized that she did shower this morning after she "wet the bed" last night. Pt encouraged to engage in the milieu, attend meals and rec therapy off the unit. Pt did attend rec therapy in the courtyard this afternoon when encouraged by staff but then declined to attend lunch in the cafeteria due to pain. Pt c/o ongoing chronic pain today. Pt administered scheduled and prn pain meds. MD made aware of pt pain.  Medications reviewed with pt. Verbal support provided. Pt encouraged to attend groups. 15 minute checks performed for safety. Pt compliant with tx plan.

## 2017-08-20 NOTE — [facility] Suicide Risk Assessment (Signed)
[facility] INPATIENT:  Family/Significant Other Suicide Prevention Education  Suicide Prevention Education:  Education Completed; Lear Ng, son, [phone], has been identified by the patient as the family member/significant other with whom the patient will be residing, and identified as the person(s) who will aid the patient in the event of a mental health crisis (suicidal ideations/suicide attempt).  With written consent from the patient, the family member/significant other has been provided the following suicide prevention education, prior to the and/or following the discharge of the patient.  The suicide prevention education provided includes the following:  Suicide risk factors  Suicide prevention and interventions  National Suicide Hotline telephone number  [facility] assessment telephone number  [facility] Emergency Assistance 911  [facility] and/or Residential Mobile Crisis Unit telephone number  Request made of family/significant other to:  Remove weapons (e.g., guns, rifles, knives), all items previously/currently identified as safety concern.  No guns in the home per [place].  Remove drugs/medications (over-the-counter, prescriptions, illicit drugs), all items previously/currently identified as a safety concern.  The family member/significant other verbalizes understanding of the suicide prevention education information provided.  The family member/significant other agrees to remove the items of safety concern listed above.  PT lives with her other son who is bipolar.  They bump heads a lot.  Pt tried to live in own apartment but got lonely and depressed.  Brother thinks pt is bipolar as well because her moods change quickly.    Lorri Frederick, LCSW 08/20/2017, 8:44 AM

## 2017-08-20 NOTE — Progress Notes (Signed)
Pt observed in dayroom, attending wrap-up group. Pt presents with depressed/flat/worried affect and mood. Pt denies SI/HI/AVH at this time. Rates pain 5/10; Lower back. BP WNL; on lower end. Pt was encourage to push fluids and eat. Pt is minimal with interaction. Pt states she will live with son once d/c. Pt states she felt ready. PRN vistaril requested and given. Continue with POC.

## 2017-08-21 MED ORDER — ARIPIPRAZOLE 2 MG PO TABS: 2.0000 mg | ORAL_TABLET | Freq: Every day | ORAL | 0 refills | Status: DC

## 2017-08-21 MED ORDER — HYDROXYZINE HCL 25 MG PO TABS: 25.0000 mg | ORAL_TABLET | Freq: Four times a day (QID) | ORAL | 0 refills | Status: DC | PRN

## 2017-08-21 MED ORDER — VENLAFAXINE HCL ER 150 MG PO CP24: 150.0000 mg | ORAL_CAPSULE | Freq: Every evening | ORAL | 0 refills | Status: AC

## 2017-08-21 NOTE — [facility] Suicide Risk Assessment (Signed)
[facility] Discharge Suicide Risk Assessment   Principal Problem: <principal problem not specified> Discharge Diagnoses:  Patient Active Problem List   Diagnosis Date Noted  . MDD (major depressive disorder), recurrent episode, severe (HCC) [F33.2] 08/17/2017  . Weight loss [R63.4] 11/28/2015  . 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 Herniated nucleus pulposus, lumbar [M51.26] 04/13/2014  . Degenerative disc disease, lumbar [M51.36] 04/05/2014  . Right-sided low back pain with right-sided sciatica [M54.41] 04/05/2014  . Hypothyroidism [E03.9] 04/05/2014  . Hypokalemia [E87.6] 04/05/2014  . Asthma, chronic [J45.909]   . Back pain [M54.9] 04/02/2014  . Other specified examination [Z01.89] 07/08/2013  . 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 Rash and nonspecific skin eruption [R21] 07/05/2013  . Screening for STD (sexually transmitted disease) [Z11.3] 07/05/2013    Total Time spent with patient: 30 minutes  Musculoskeletal: Strength & Muscle Tone: decreased Gait & Station: unsteady Patient leans: N/A  Psychiatric Specialty Exam: Review of Systems  All other systems reviewed and are negative.   Blood pressure 111/60, pulse 77, temperature 98 F (36.7 C), temperature source Oral, resp. rate 16, height 5' 4.5" (1.638 m), weight 106.6 kg (235 lb), SpO2 (!) 77 %.Body mass index is 39.71 kg/m.  General Appearance: Casual  Eye Contact::  Fair  Speech:  Normal Rate409  Volume:  Normal  Mood:  Anxious  Affect:  Appropriate  Thought Process:  Coherent  Orientation:  Full (Time, Place, and Person)  Thought Content:  Logical  Suicidal Thoughts:  No  Homicidal Thoughts:  No  Memory:  Immediate;   Fair  Judgement:  Fair  Insight:  Fair  Psychomotor Activity:  Normal  Concentration:  Good  Recall:  Fair  Fund of Knowledge:Good  Language: Good  Akathisia:  No  Handed:  Right  AIMS (if indicated):     Assets:  Communication Skills Desire for Improvement Housing Resilience   Sleep:  Number of Hours: 6.75  Cognition: WNL  ADL's:  Intact   Mental Status Per Nursing Assessment::   On Admission:     Demographic Factors:  Divorced or widowed and Caucasian  Loss Factors: NA  Historical Factors: Impulsivity  Risk Reduction Factors:   Positive social support  Continued Clinical Symptoms:  Depression:   Impulsivity  Cognitive Features That Contribute To Risk:  None    Suicide Risk:  Minimal: No identifiable suicidal ideation.  Patients presenting with no risk factors but with morbid ruminations; may be classified as minimal risk based on the severity of the depressive symptoms  Follow-up Information    [facility] Go on 09/01/2017.   Why:  Please attend your medication appt with Leone Payor on Tuesday, 09/01/17, at 10:15am. Contact information: [address] [place] [address] [phone]           Plan Of Care/Follow-up recommendations:  Activity:  ad lib  [name], MD 08/21/2017, 7:52 AM

## 2017-08-21 NOTE — Progress Notes (Signed)
  [facility] Adult Case Management Discharge Plan :  Will you be returning to the same living situation after discharge:  Yes,  with son At discharge, do you have transportation home?: Yes,  son Do you have the ability to pay for your medications: Yes,  [employer]  Release of information consent forms completed and in the chart;  Patient's signature needed at discharge.  Patient to Follow up at: Follow-up Information    [facility]. Go on 09/01/2017.   Why:  Please attend your medication appt with Leone Payor on Tuesday, 09/01/17, at 10:15am. Contact information: [address] [place] [address] [phone]        [facility] Go on 09/23/2017.   Why:  Please attend your therapy appt with Bethanne Ginger on Wednesday, 09/23/17, at 9:00am. Contact information: [address] [place] [address]          Next level of care provider has access to [facility] Link:no  Safety Planning and Suicide Prevention discussed: Yes,  with son  Have you used any form of tobacco in the last 30 days? (Cigarettes, Smokeless Tobacco, Cigars, and/or Pipes): Yes  Has patient been referred to the Quitline?: Patient refused referral  Patient has been referred for addiction treatment: N/A  Lorri Frederick, LCSW 08/21/2017, 10:57 AM

## 2017-08-21 NOTE — [facility] Group Notes (Signed)
[facility] LCSW Group Therapy Note  Date/Time: 5/0/19, 1315  Type of Therapy/Topic:  Group Therapy:  Feelings about Diagnosis  Participation Level:  Active   Mood: pleasant   Description of Group:    This group will allow patients to explore their thoughts and feelings about diagnoses they have received. Patients will be guided to explore their level of understanding and acceptance of these diagnoses. Facilitator will encourage patients to process their thoughts and feelings about the reactions of others to their diagnosis, and will guide patients in identifying ways to discuss their diagnosis with significant others in their lives. This group will be process-oriented, with patients participating in exploration of their own experiences as well as giving and receiving support and challenge from other group members.   Therapeutic Goals: 1. Patient will demonstrate understanding of diagnosis as evidence by identifying two or more symptoms of the disorder:  2. Patient will be able to express two feelings regarding the diagnosis 3. Patient will demonstrate ability to communicate their needs through discussion and/or role plays  Summary of Patient Progress:Pt was active during group discussion and made a number of contributions.  Pt was aware of her diagnosis and symptoms.  Good participation.        Therapeutic Modalities:   Cognitive Behavioral Therapy Brief Therapy Feelings Identification   Daleen Squibb, LCSW

## 2017-08-21 NOTE — Progress Notes (Signed)
Recreation Therapy Notes  Date: 5.10.19 Time: 0930 Location: [address] Dayroom  Group Topic: Stress Management  Goal Area(s) Addresses:  Patient will verbalize importance of using healthy stress management.  Patient will identify positive emotions associated with healthy stress management.   Behavioral Response: Engaged  Intervention: Stress Management  Activity : Body Scan Meditation.  LRT played meditation that allowed patients to take note of any sensations and feelings they may have been experiencing throughout their bodies.  Patients were to follow along as meditation played.  Education:  Stress Management, Discharge Planning.   Education Outcome: Acknowledges edcuation/In group clarification offered/Needs additional education  Clinical Observations/Feedback: Pt attended group.    Caroll Rancher, LRT/CTRS         Caroll Rancher A 08/21/2017 12:37 PM

## 2017-08-21 NOTE — Discharge Summary (Signed)
Physician Discharge Summary Note  Patient:  April Soto is an 57 y.o., female MRN:  [id number] DOB:  June 17, 1960 Patient phone:  [phone] (home)  Patient address:   [address] [place] [address],  Total Time spent with patient: 20 minutes  Date of Admission:  08/17/2017 Date of Discharge: 08/21/17  Reason for Admission:  Worsening depression with Si  Principal Problem: MDD (major depressive disorder), recurrent episode, severe [facility]) Discharge Diagnoses: Patient Active Problem List   Diagnosis Date Noted  . MDD (major depressive disorder), recurrent episode, severe (HCC) [F33.2] 08/17/2017  . Weight loss [R63.4] 11/28/2015  . 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 Herniated nucleus pulposus, lumbar [M51.26] 04/13/2014  . Degenerative disc disease, lumbar [M51.36] 04/05/2014  . Right-sided low back pain with right-sided sciatica [M54.41] 04/05/2014  . Hypothyroidism [E03.9] 04/05/2014  . Hypokalemia [E87.6] 04/05/2014  . Asthma, chronic [J45.909]   . Back pain [M54.9] 04/02/2014  . Other specified examination [Z01.89] 07/08/2013  . 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 Rash and nonspecific skin eruption [R21] 07/05/2013  . Screening for STD (sexually transmitted disease) [Z11.3] 07/05/2013    Past Psychiatric History: depression, anxiety  Past Medical History:  Past Medical History:  Diagnosis Date  . Anemia    "years ago"  . Anxiety    takes Ativan daily  . Asthma   . Bipolar 1 disorder (HCC)   . Chronic back pain    HNP  . Constipation    takes Ra [employer] daily  . Degenerative disc disease   . Depression    was on Prozac but taken off by MD for a couple of weeks  . Depression   . Fibromyalgia    takes Lyrica daily  . Genital herpes   . GERD (gastroesophageal reflux disease)    takes Nexium daily  . Headache(784.0)   . Heart murmur    "grew out of"  . History of bronchitis    last time 15+yrs ago  . History of rheumatic  fever   . Hyperlipidemia    takes Simvastatin daily  . Hypertension    takes Coreg daily  . Hypothyroidism    takes Synthroid daily  . Joint pain   . Muscle spasm    takes Flexeril daily as needed  . Nocturia   . Osteoarthritis   . Peripheral edema    takes HCTZ daily  . Pneumonia    hx of;last time 30+yrs ago  . Raynaud's disease   . Rheumatic fever    as a child  . Seasonal allergies    takes Zyrtec daily and uses Dymista as well  . Stroke [facility])    ? 5 years ago. No lasting deficits  . Urinary incontinence   . Weakness    nmbness and tingling-right leg    Past Surgical History:  Procedure Laterality Date  . ABDOMINAL HYSTERECTOMY    . BACK SURGERY    . CHOLECYSTECTOMY    . COLONOSCOPY    . ESOPHAGOGASTRODUODENOSCOPY    . FOOT SURGERY Right 12/2013   ankle, heel and top of foot  . LUMBAR LAMINECTOMY/DECOMPRESSION MICRODISCECTOMY Right 01/14/2013   Procedure: RIGHT LUMBAR FOUR FIVE  LAMINECTOMY/DECOMPRESSION MICRODISCECTOMY 1 LEVEL;  Surgeon: Reinaldo Meeker, MD;  Location: [facility];  Service: Neurosurgery;  Laterality: Right;  RIGHT L45 microdiskectomy  . OTHER SURGICAL HISTORY     Gastric stapling for weight loss. patient reports done in 1980's  . SEPTOPLASTY    . TONSILLECTOMY    .  TUBAL LIGATION     Family History:  Family History  Problem Relation Age of Onset  . Diabetes type II Mother   . Hypertension Mother   . Diabetes Mother   . Diabetes Sister   . Hypertension Sister   . Hypertension Maternal Grandmother   . Diabetes Maternal Grandmother    Family Psychiatric  History: Denies Social History:  Social History   Substance and Sexual Activity  Alcohol Use No     Social History   Substance and Sexual Activity  Drug Use No    Social History   Socioeconomic History  . Marital status: Divorced    Spouse name: Not on file  . Number of children: Not on file  . Years of education: Not on file  . Highest education level: Not on file   Occupational History  . Not on file  Social Needs  . Financial resource strain: Not on file  . Food insecurity:    Worry: Not on file    Inability: Not on file  . Transportation needs:    Medical: Not on file    Non-medical: Not on file  Tobacco Use  . Smoking status: Current Some Day Smoker    Packs/day: 1.00    Years: 40.00    Pack years: 40.00    Types: Cigarettes  . Smokeless tobacco: Never Used  Substance and Sexual Activity  . Alcohol use: No  . Drug use: No  . Sexual activity: Not Currently    Birth control/protection: Surgical  Lifestyle  . Physical activity:    Days per week: Not on file    Minutes per session: Not on file  . Stress: Not on file  Relationships  . Social connections:    Talks on phone: Not on file    Gets together: Not on file    Attends religious service: Not on file    Active member of club or organization: Not on file    Attends meetings of clubs or organizations: Not on file    Relationship status: Not on file  Other Topics Concern  . Not on file  Social History Narrative  . Not on file    Hospital Course:   08/18/17 [facility] MDD Assessment: 57 y.o.female.Ms. Emminger arrived to the ED by way of personal transportation by her son. She reports that she "I'm suicidal". She reports that she has been feeling like this for "a couple of months". She states that she has "been fighting depression" for her whole life. She states that the feelings of suicide have been overwhelming. She reports having a plan of "a gun or hanging myself". She denied having access to weapons. She reports that she had difficulty falling asleep. She shared that when she does sleep, she has a hard time waking up. She denied changes in her appetite. She shared that she isolates from everyone but her son, and she has gotten to the point that she does not care about anything anymore. She reports that she takes anxiety medications. She denied having auditory or visual  hallucinations. She denied homicidal ideation or intent. She denied the use of alcohol or drugs. She reports that she has been facing stress from being overwhelmed by stress. She states "I worry about everything". Today on assessment, she reports a 10/10 depression with congruent affect, denies suicidal ideations, sleep was "pretty good", appetite is "poor."  She has been on Effexor for 3 years and feels it is no longer working, inquired about Abilify--will implement.  Patient remained on the [facility] unit for 3 days. The patient stabilized on medication and therapy. Patient was discharged on Abilify 2 mg Daily, Vistaril 25 mg Q6H PRN, and Trazodone 50 mg QHS PRN, and Effexor XR 150 mg Daily. Patient has shown improvement with improved mood, affect, sleep, appetite, and interaction. Patient has attended group and participated. Patient has been seen in the day room interacting with peers and staff appropriately. Patient denies any SI/HI/AVH and contracts for safety. Patient agrees to follow up at [facility]. Patient is provided with prescriptions for their medications upon discharge.    Physical Findings: AIMS: Facial and Oral Movements Muscles of Facial Expression: None, normal Lips and Perioral Area: None, normal Jaw: None, normal Tongue: None, normal,Extremity Movements Upper (arms, wrists, hands, fingers): None, normal Lower (legs, knees, ankles, toes): None, normal, Trunk Movements Neck, shoulders, hips: None, normal, Overall Severity Severity of abnormal movements (highest score from questions above): None, normal Incapacitation due to abnormal movements: None, normal Patient's awareness of abnormal movements (rate only patient's report): No Awareness, Dental Status Current problems with teeth and/or dentures?: No Does patient usually wear dentures?: No  CIWA:    COWS:     Musculoskeletal: Strength & Muscle Tone: within normal limits Gait & Station: normal Patient  leans: N/A  Psychiatric Specialty Exam: Physical Exam  Nursing note and vitals reviewed. Constitutional: She is oriented to person, place, and time. She appears well-developed and well-nourished.  Cardiovascular: Normal rate.  Respiratory: Effort normal.  Musculoskeletal: Normal range of motion.  Neurological: She is alert and oriented to person, place, and time.  Skin: Skin is warm.    Review of Systems  Constitutional: Negative.   HENT: Negative.   Eyes: Negative.   Respiratory: Negative.   Cardiovascular: Negative.   Gastrointestinal: Negative.   Genitourinary: Negative.   Musculoskeletal: Negative.   Skin: Negative.   Neurological: Negative.   Endo/Heme/Allergies: Negative.   Psychiatric/Behavioral: Negative.     Blood pressure 111/60, pulse 77, temperature 98 F (36.7 C), temperature source Oral, resp. rate 16, height 5' 4.5" (1.638 m), weight 106.6 kg (235 lb), SpO2 (!) 77 %.Body mass index is 39.71 kg/m.  General Appearance: Casual  Eye Contact:  Good  Speech:  Clear and Coherent and Normal Rate  Volume:  Normal  Mood:  Euthymic  Affect:  Congruent  Thought Process:  Goal Directed and Descriptions of Associations: Intact  Orientation:  Full (Time, Place, and Person)  Thought Content:  WDL  Suicidal Thoughts:  No  Homicidal Thoughts:  No  Memory:  Immediate;   Good Recent;   Good Remote;   Good  Judgement:  Fair  Insight:  Fair  Psychomotor Activity:  Normal  Concentration:  Concentration: Good and Attention Span: Good  Recall:  Good  Fund of Knowledge:  Good  Language:  Good  Akathisia:  No  Handed:  Right  AIMS (if indicated):     Assets:  Communication Skills Desire for Improvement Financial Resources/Insurance Housing Physical Health Social Support Transportation  ADL's:  Intact  Cognition:  WNL  Sleep:  Number of Hours: 6.75     Have you used any form of tobacco in the last 30 days? (Cigarettes, Smokeless Tobacco, Cigars, and/or Pipes): Yes   Has this patient used any form of tobacco in the last 30 days? (Cigarettes, Smokeless Tobacco, Cigars, and/or Pipes) Yes, Yes, A prescription for an FDA-approved tobacco cessation medication was offered at discharge and the patient refused  Blood Alcohol level:  Lab  Results  Component Value Date   ETH <10 08/16/2017    Metabolic Disorder Labs:  No results found for: HGBA1C, MPG No results found for: PROLACTIN No results found for: CHOL, TRIG, HDL, CHOLHDL, VLDL, LDLCALC  See Psychiatric Specialty Exam and Suicide Risk Assessment completed by Attending Physician prior to discharge.  Discharge destination:  Home  Is patient on multiple antipsychotic therapies at discharge:  No   Has Patient had three or more failed trials of antipsychotic monotherapy by history:  No  Recommended Plan for Multiple Antipsychotic Therapies: NA   Allergies as of 08/21/2017   No Known Allergies     Medication List    STOP taking these medications   LORazepam 1 MG tablet Commonly known as:  ATIVAN     TAKE these medications     Indication  acyclovir 400 MG tablet Commonly known as:  ZOVIRAX Take 400 mg by mouth at bedtime.  Indication:  Per PCP   ARIPiprazole 2 MG tablet Commonly known as:  ABILIFY Take 1 tablet (2 mg total) by mouth daily. For mood control Start taking on:  08/22/2017  Indication:  mood stability   cetirizine 10 MG tablet Commonly known as:  ZYRTEC Take 10 mg by mouth daily.  Indication:  Hayfever   diclofenac 75 MG EC tablet Commonly known as:  VOLTAREN Take 75 mg by mouth daily as needed for mild pain or moderate pain.  Indication:  Backache   EMBEDA 30-1.2 MG Cpcr Generic drug:  Morphine-Naltrexone Take 1 capsule by mouth 2 (two) times daily.  Indication:  Moderate to Severe Chronic Pain   esomeprazole 40 MG capsule Commonly known as:  NEXIUM Take 40 mg by mouth daily.  Indication:  Gastroesophageal Reflux Disease   fluticasone 50 MCG/ACT nasal  spray Commonly known as:  FLONASE Place 1 spray into both nostrils at bedtime as needed for allergies.  Indication:  Signs and Symptoms of Nose Diseases   hydrocortisone 2.5 % rectal cream Commonly known as:  ANUSOL-HC Apply 1 application topically as needed for hemorrhoids.  Indication:  Per PCP   hydrOXYzine 25 MG tablet Commonly known as:  ATARAX/VISTARIL Take 1 tablet (25 mg total) by mouth every 6 (six) hours as needed for anxiety.  Indication:  Feeling Anxious   levothyroxine 112 MCG tablet Commonly known as:  SYNTHROID, LEVOTHROID Take 112 mcg by mouth daily before breakfast.  Indication:  Underactive Thyroid   LINZESS 145 MCG Caps capsule Generic drug:  linaclotide Take 145 mcg by mouth daily as needed (cramping).  Indication:  Per PCP   metoprolol succinate 50 MG 24 hr tablet Commonly known as:  TOPROL-XL Take 50 mg by mouth at bedtime.  Indication:  High Blood Pressure Disorder   oxyCODONE-acetaminophen 10-325 MG tablet Commonly known as:  PERCOCET Take 1 tablet by mouth every 6 (six) hours as needed for pain. What changed:  Another medication with the same name was removed. Continue taking this medication, and follow the directions you see here.  Indication:  Pain   PATADAY 0.2 % Soln Generic drug:  Olopatadine HCl Place 1 drop into both eyes at bedtime as needed (allergies).  Indication:  Per PCP   pregabalin 150 MG capsule Commonly known as:  LYRICA Take 150 mg by mouth 3 (three) times daily.  Indication:  Per PCP   PROAIR HFA 108 (90 Base) MCG/ACT inhaler Generic drug:  albuterol Inhale 2 puffs into the lungs every 4 (four) hours as needed for wheezing or shortness of breath.  Indication:  Asthma   rosuvastatin 40 MG tablet Commonly known as:  CRESTOR Take 40 mg by mouth daily.  Indication:  High Amount of Fats in the Blood   venlafaxine XR 150 MG 24 hr capsule Commonly known as:  EFFEXOR-XR Take 1 capsule (150 mg total) by mouth every evening.  For mood control What changed:    medication strength  how much to take  additional instructions  Indication:  mood stability      Follow-up Information    [facility] Go on 09/01/2017.   Why:  Please attend your medication appt with Leone Payor on Tuesday, 09/01/17, at 10:15am. Contact information: [address] [place] [address] [phone]           Follow-up recommendations:  Continue activity as tolerated. Continue diet as recommended by your PCP. Ensure to keep all appointments with outpatient providers.  Comments:  Patient is instructed prior to discharge to: Take all medications as prescribed by his/her mental healthcare provider. Report any adverse effects and or reactions from the medicines to his/her outpatient provider promptly. Patient has been instructed & cautioned: To not engage in alcohol and or illegal drug use while on prescription medicines. In the event of worsening symptoms, patient is instructed to call the crisis hotline, 911 and or go to the nearest ED for appropriate evaluation and treatment of symptoms. To follow-up with his/her primary care provider for your other medical issues, concerns and or health care needs.    Signed: Gerlene Burdock Money, FNP 08/21/2017, 10:54 AM

## 2017-08-22 NOTE — Progress Notes (Signed)
Late entry Pt dc'd to home after dc instructions were reviewed and cc of instructions given to pt ( AVS, SRA, SSP and transition record). Pt stated verbal understanding and completed daily assessment and on this she wrote she denied SI and she rated her depression, hopelessness and anxiety " 5/5/2", respectively.Pt dc'd to care of her son.

## 2018-03-30 ENCOUNTER — Other Ambulatory Visit: Payer: Self-pay | Admitting: Nurse Practitioner

## 2018-03-30 ENCOUNTER — Ambulatory Visit
Admission: RE | Admit: 2018-03-30 | Discharge: 2018-03-30 | Disposition: A | Discharge status: Home / Self Care | Payer: Medicare Other | Source: Ambulatory Visit | Attending: Nurse Practitioner | Admitting: Nurse Practitioner

## 2018-03-30 DIAGNOSIS — Z1231 Encounter for screening mammogram for malignant neoplasm of breast: Secondary | ICD-10-CM | POA: Diagnosis present

## 2020-05-03 ENCOUNTER — Other Ambulatory Visit: Payer: Self-pay

## 2020-05-03 ENCOUNTER — Ambulatory Visit (INDEPENDENT_AMBULATORY_CARE_PROVIDER_SITE_OTHER): Payer: Medicare Other | Admitting: Family Medicine

## 2020-05-03 ENCOUNTER — Encounter: Payer: Self-pay | Admitting: Family Medicine

## 2020-05-03 ENCOUNTER — Other Ambulatory Visit: Payer: Self-pay | Admitting: Family Medicine

## 2020-05-03 VITALS — BP 147/88 | HR 132 | Temp 97.1°F | Ht 64.5 in | Wt 220.0 lb

## 2020-05-03 DIAGNOSIS — E039 Hypothyroidism, unspecified: Secondary | ICD-10-CM | POA: Diagnosis not present

## 2020-05-03 DIAGNOSIS — G8929 Other chronic pain: Secondary | ICD-10-CM

## 2020-05-03 DIAGNOSIS — M5441 Lumbago with sciatica, right side: Secondary | ICD-10-CM | POA: Diagnosis not present

## 2020-05-03 DIAGNOSIS — I10 Essential (primary) hypertension: Secondary | ICD-10-CM | POA: Diagnosis not present

## 2020-05-03 DIAGNOSIS — F32A Depression, unspecified: Secondary | ICD-10-CM

## 2020-05-03 MED ORDER — METOPROLOL SUCCINATE ER 50 MG PO TB24: 50.0000 mg | ORAL_TABLET | Freq: Every day | ORAL | 1 refills | Status: DC

## 2020-05-03 NOTE — Progress Notes (Signed)
Subjective:    Patient ID: April Soto, female    DOB: 07/31/1960, 60 y.o.   MRN: [id number]  April Soto is a 60 y.o. female presenting on 05/03/2020 for Establish Care (Pt come in requesting refills on medication.)   HPI  Ms. Gent presents to clinic as a new patient to establish care.  Previous PCP was at [facility], switching offices due to wanting change.  Records will be requested.  Past medical, family, and surgical history reviewed w/ pt.  She has acute concerns today for medication refill for her metoprolol  She has no other acute concerns today.  Reports her last labs were done with [facility] within the last 6 months and that they were "normal".  Depression screen PHQ 2/9 07/05/2013  Decreased Interest 1  Down, Depressed, Hopeless 0  PHQ - 2 Score 1    Social History   Tobacco Use  . Smoking status: Current Some Day Smoker    Packs/day: 1.00    Years: 40.00    Pack years: 40.00    Types: Cigarettes  . Smokeless tobacco: Never Used  Vaping Use  . Vaping Use: Never used  Substance Use Topics  . Alcohol use: No  . Drug use: No    Review of Systems  Constitutional: Negative.   HENT: Negative.   Eyes: Negative.   Respiratory: Negative.   Cardiovascular: Negative.   Gastrointestinal: Negative.   Endocrine: Negative.   Genitourinary: Negative.   Musculoskeletal: Negative.   Skin: Negative.   Allergic/Immunologic: Negative.   Neurological: Negative.   Hematological: Negative.   Psychiatric/Behavioral: Negative.    Per HPI unless specifically indicated above     Objective:    BP (!) 147/88 (BP Location: Right Arm, Patient Position: Sitting, Cuff Size: Large)   Pulse (!) 132   Temp (!) 97.1 F (36.2 C) (Temporal)   Ht 5' 4.5" (1.638 m)   Wt 220 lb (99.8 kg)   SpO2 100%   BMI 37.18 kg/m   Wt Readings from Last 3 Encounters:  05/03/20 220 lb (99.8 kg)  08/16/17 235 lb (106.6 kg)  03/13/17 235 lb (106.6 kg)    Physical  Exam Vitals and nursing note reviewed.  Constitutional:      General: She is not in acute distress.    Appearance: Normal appearance. She is well-developed and well-groomed. She is obese. She is not ill-appearing or toxic-appearing.  HENT:     Head: Normocephalic and atraumatic.     Nose:     Comments: Lesia Sago is in place, covering mouth and nose. Eyes:     General: Lids are normal. Vision grossly intact.        Right eye: No discharge.        Left eye: No discharge.     Extraocular Movements: Extraocular movements intact.     Conjunctiva/sclera: Conjunctivae normal.     Pupils: Pupils are equal, round, and reactive to light.  Cardiovascular:     Rate and Rhythm: Regular rhythm. Tachycardia present.     Pulses: Normal pulses.  Pulmonary:     Effort: Pulmonary effort is normal. No respiratory distress.  Skin:    General: Skin is warm and dry.     Capillary Refill: Capillary refill takes less than 2 seconds.  Neurological:     General: No focal deficit present.     Mental Status: She is alert and oriented to person, place, and time.  Psychiatric:  Attention and Perception: Attention and perception normal.        Mood and Affect: Mood and affect normal.        Speech: Speech normal.        Behavior: Behavior normal. Behavior is cooperative.        Thought Content: Thought content normal.        Cognition and Memory: Cognition and memory normal.        Judgment: Judgment normal.    Results for orders placed or performed during the hospital encounter of 08/16/17  Comprehensive metabolic panel  Result Value Ref Range   Sodium 138 135 - 145 mmol/L   Potassium 4.2 3.5 - 5.1 mmol/L   Chloride 105 101 - 111 mmol/L   CO2 26 22 - 32 mmol/L   Glucose, Bld 137 (H) 65 - 99 mg/dL   BUN 8 6 - 20 mg/dL   Creatinine, Ser 6.71 0.44 - 1.00 mg/dL   Calcium 8.8 (L) 8.9 - 10.3 mg/dL   Total Protein 6.9 6.5 - 8.1 g/dL   Albumin 3.6 3.5 - 5.0 g/dL   AST 25 15 - 41 U/L   ALT 13 (L) 14  - 54 U/L   Alkaline Phosphatase 67 38 - 126 U/L   Total Bilirubin 0.6 0.3 - 1.2 mg/dL   GFR calc non Af Amer >60 >60 mL/min   GFR calc Af Amer >60 >60 mL/min   Anion gap 7 5 - 15  Ethanol  Result Value Ref Range   Alcohol, Ethyl (B) <10 <10 mg/dL  Salicylate level  Result Value Ref Range   Salicylate Lvl <7.0 2.8 - 30.0 mg/dL  Acetaminophen level  Result Value Ref Range   Acetaminophen (Tylenol), Serum 12 10 - 30 ug/mL  cbc  Result Value Ref Range   WBC 6.7 3.6 - 11.0 K/uL   RBC 4.94 3.80 - 5.20 MIL/uL   Hemoglobin 16.1 (H) 12.0 - 16.0 g/dL   HCT 24.5 80.9 - 98.3 %   MCV 94.0 80.0 - 100.0 fL   MCH 32.6 26.0 - 34.0 pg   MCHC 34.7 32.0 - 36.0 g/dL   RDW 38.2 50.5 - 39.7 %   Platelets 189 150 - 440 K/uL  Urine Drug Screen, Qualitative  Result Value Ref Range   Tricyclic, Ur Screen NONE DETECTED NONE DETECTED   Amphetamines, Ur Screen NONE DETECTED NONE DETECTED   MDMA (Ecstasy)Ur Screen NONE DETECTED NONE DETECTED   Cocaine Metabolite,Ur [place] NONE DETECTED NONE DETECTED   Opiate, Ur Screen POSITIVE (A) NONE DETECTED   Phencyclidine (PCP) Ur S NONE DETECTED NONE DETECTED   Cannabinoid 50 Ng, Ur [place] NONE DETECTED NONE DETECTED   Barbiturates, Ur Screen NONE DETECTED NONE DETECTED   Benzodiazepine, Ur Scrn POSITIVE (A) NONE DETECTED   Methadone Scn, Ur NONE DETECTED NONE DETECTED      Assessment & Plan:   Problem List Items Addressed This Visit      Cardiovascular and Mediastinum   Hypertension - Primary    Has been on metoprolol succinate 50mg  nightly, reports has been out of this medication for 2 days.  Will restart on medication and refill on metoprolol sent to pharmacy on file.  RTC in 3 months for CPE and labs.      Relevant Medications   icosapent Ethyl (VASCEPA) 1 g capsule   metoprolol succinate (TOPROL-XL) 50 MG 24 hr tablet     Endocrine   Hypothyroidism    Currently taking of levothyroxine.  Reports  labs [facility] and  that her TSH labs were WNL.  Will request copies of previous medical records and have her RTC in 3 months for labs.      Relevant Medications   metoprolol succinate (TOPROL-XL) 50 MG 24 hr tablet     Other   Back pain    Currently following with pain management and is being treated with cyclobenzaprine 10mg  TID and MS contin 15mg  TID.  Reviewed and educated will keep those medications with her pain management provider, will not be renewing through PCP.  Patient verbalized understanding.      Relevant Medications   cyclobenzaprine (FLEXERIL) 10 MG tablet   morphine (MS CONTIN) 15 MG 12 hr tablet   Depression    Currently following with a psychiatrist for the management of her depression with venlafaxine, hydroxyzine, and geodon.  Will keep these prescriptions with her psychiatrist.  Patient verbalized understanding.      Relevant Medications   hydrOXYzine (ATARAX/VISTARIL) 10 MG tablet    Other Visit Diagnoses    Essential hypertension       Relevant Medications   icosapent Ethyl (VASCEPA) 1 g capsule   metoprolol succinate (TOPROL-XL) 50 MG 24 hr tablet      Meds ordered this encounter  Medications  . metoprolol succinate (TOPROL-XL) 50 MG 24 hr tablet    Sig: Take 1 tablet (50 mg total) by mouth at bedtime.    Dispense:  90 tablet    Refill:  1   Follow up plan: Return in about 3 months (around 08/01/2020) for CPE & Labs.   , FNP Family Nurse Practitioner [facility] 05/03/2020, 2:38 PM

## 2020-05-03 NOTE — Assessment & Plan Note (Signed)
Currently following with a psychiatrist for the management of her depression with venlafaxine, hydroxyzine, and geodon.  Will keep these prescriptions with her psychiatrist.  Patient verbalized understanding.

## 2020-05-03 NOTE — Assessment & Plan Note (Signed)
Currently taking of levothyroxine.  Reports [facility] and that her TSH labs were WNL.  Will request copies of previous medical records and have her RTC in 3 months for labs.

## 2020-05-03 NOTE — Patient Instructions (Signed)
I have sent in a refill on your metoprolol to your pharmacy on file.  Keep all upcoming appointments with your psychiatrist and pain management providers.  We will see you back in clinic in 3 months for your physical and labs to be drawn  We will request copies of your medical records from [facility] for review

## 2020-05-03 NOTE — Assessment & Plan Note (Signed)
Currently following with pain management and is being treated with cyclobenzaprine 10mg  TID and MS contin 15mg  TID.  Reviewed and educated will keep those medications with her pain management provider, will not be renewing through PCP.  Patient verbalized understanding.

## 2020-05-03 NOTE — Assessment & Plan Note (Signed)
Has been on metoprolol succinate 50mg  nightly, reports has been out of this medication for 2 days.  Will restart on medication and refill on metoprolol sent to pharmacy on file.  RTC in 3 months for CPE and labs.

## 2020-05-15 ENCOUNTER — Other Ambulatory Visit: Payer: Self-pay | Admitting: Family Medicine

## 2020-05-15 DIAGNOSIS — E785 Hyperlipidemia, unspecified: Secondary | ICD-10-CM

## 2020-05-16 NOTE — Telephone Encounter (Signed)
Requested medication (s) are due for refill today:  yes  Requested medication (s) are on the active medication list: yes  Last refill: 12/23/19  Future visit scheduled: yes  Notes to clinic:  historical provider   Requested Prescriptions  Pending Prescriptions Disp Refills   rosuvastatin (CRESTOR) 40 MG tablet [Pharmacy Med Name: ROSUVASTATIN CALCIUM 40 MG TAB] 90 tablet     Sig: TAKE 1 TABLET BY MOUTH DAILY      Cardiovascular:  Antilipid - Statins Failed - 05/15/2020  4:37 PM      Failed - Total Cholesterol in normal range and within 360 days    No results found for: CHOL, POCCHOL, CHOLTOT        Failed - LDL in normal range and within 360 days    No results found for: LDLCALC, LDLC, HIRISKLDL, POCLDL, LDLDIRECT, REALLDLC, TOTLDLC        Failed - HDL in normal range and within 360 days    No results found for: HDL, POCHDL        Failed - Triglycerides in normal range and within 360 days    No results found for: TRIG, POCTRIG        Passed - Patient is not pregnant      Passed - Valid encounter within last 12 months    Recent Outpatient Visits           1 week ago Hypertension, unspecified type   [facility], Jodelle Gross, FNP       Future Appointments             In 2 months Althea Charon, Netta Neat, DO [facility], [facility]

## 2020-06-01 ENCOUNTER — Other Ambulatory Visit: Payer: Self-pay | Admitting: Family Medicine

## 2020-06-01 DIAGNOSIS — A6000 Herpesviral infection of urogenital system, unspecified: Secondary | ICD-10-CM

## 2020-06-01 DIAGNOSIS — K219 Gastro-esophageal reflux disease without esophagitis: Secondary | ICD-10-CM

## 2020-06-01 DIAGNOSIS — E7800 Pure hypercholesterolemia, unspecified: Secondary | ICD-10-CM

## 2020-06-01 NOTE — Telephone Encounter (Signed)
Can you please contact patient?  Previous PCP Danielle Rankin, FNP   I have not seen her before.  I see a discrepancy in the dosing of Acyclovir - the chart says she used to take 400mg  daily, this request is for 400mg  twice a day.  Can you clarify which dose?  , DO [facility] 06/01/2020, 5:20 PM

## 2020-06-01 NOTE — Telephone Encounter (Signed)
Requested medication (s) are due for refill today: historical provider  Requested medication (s) are on the active medication list: yes  Last refill:  na  Future visit scheduled: yes in 2 months  Notes to clinic:  Lovaza not on medication list. Acyclovir and nexium by historical provider     Requested Prescriptions  Pending Prescriptions Disp Refills   omega-3 acid ethyl esters (LOVAZA) 1 g capsule [Pharmacy Med Name: OMEGA-3-ACID ETHYL ESTERS 1 GM CAP] 120 capsule     Sig: TAKE TWO CAPSULES BY MOUTH TWICE A DAY      Endocrinology:  Nutritional Agents Passed - 06/01/2020 10:47 AM      Passed - Valid encounter within last 12 months    Recent Outpatient Visits           4 weeks ago Hypertension, unspecified type   [facility], Jodelle Gross, FNP       Future Appointments             In 2 months Althea Charon, Netta Neat, DO [facility], [facility]               acyclovir (ZOVIRAX) 400 MG tablet [Pharmacy Med Name: ACYCLOVIR 400 MG TAB] 60 tablet     Sig: TAKE 1 TABLET BY MOUTH TWICE A DAY      Antimicrobials:  Antiviral Agents - Anti-Herpetic Passed - 06/01/2020 10:47 AM      Passed - Valid encounter within last 12 months    Recent Outpatient Visits           4 weeks ago Hypertension, unspecified type   [facility], Jodelle Gross, FNP       Future Appointments             In 2 months Althea Charon, Netta Neat, DO [facility], [facility]               esomeprazole (NEXIUM) 40 MG capsule [Pharmacy Med Name: ESOMEPRAZOLE MAGNESIUM 40 MG CAP] 30 capsule     Sig: TAKE 1 CAPSULE BY MOUTH ONCE DAILY WITH LARGE GLASS OF WATER      Gastroenterology: Proton Pump Inhibitors Passed - 06/01/2020 10:47 AM      Passed - Valid encounter within last 12 months    Recent Outpatient Visits           4 weeks ago Hypertension, unspecified type   [facility], Jodelle Gross, FNP       Future  Appointments             In 2 months Althea Charon, Netta Neat, DO [facility], [facility]

## 2020-06-05 DIAGNOSIS — G894 Chronic pain syndrome: Secondary | ICD-10-CM | POA: Diagnosis not present

## 2020-06-05 DIAGNOSIS — M542 Cervicalgia: Secondary | ICD-10-CM | POA: Diagnosis not present

## 2020-06-07 DIAGNOSIS — Z79891 Long term (current) use of opiate analgesic: Secondary | ICD-10-CM | POA: Diagnosis not present

## 2020-06-07 DIAGNOSIS — G894 Chronic pain syndrome: Secondary | ICD-10-CM | POA: Diagnosis not present

## 2020-06-07 DIAGNOSIS — M542 Cervicalgia: Secondary | ICD-10-CM | POA: Diagnosis not present

## 2020-06-07 DIAGNOSIS — K5903 Drug induced constipation: Secondary | ICD-10-CM | POA: Diagnosis not present

## 2020-06-07 DIAGNOSIS — M5416 Radiculopathy, lumbar region: Secondary | ICD-10-CM | POA: Diagnosis not present

## 2020-06-13 ENCOUNTER — Other Ambulatory Visit: Payer: Self-pay

## 2020-06-13 ENCOUNTER — Ambulatory Visit (INDEPENDENT_AMBULATORY_CARE_PROVIDER_SITE_OTHER): Payer: Medicare Other | Admitting: Family Medicine

## 2020-06-13 ENCOUNTER — Encounter: Payer: Self-pay | Admitting: Family Medicine

## 2020-06-13 VITALS — Ht 66.0 in | Wt 220.0 lb

## 2020-06-13 DIAGNOSIS — J0110 Acute frontal sinusitis, unspecified: Secondary | ICD-10-CM

## 2020-06-13 MED ORDER — AMOXICILLIN-POT CLAVULANATE 875-125 MG PO TABS: 1.0000 | ORAL_TABLET | Freq: Two times a day (BID) | ORAL | 0 refills | Status: DC

## 2020-06-13 NOTE — Progress Notes (Signed)
Virtual Visit via Telephone The purpose of this virtual visit is to provide medical care while limiting exposure to the novel coronavirus (COVID19) for both patient and office staff.  Consent was obtained for phone visit:  Yes.   Answered questions that patient had about telehealth interaction:  Yes.   I discussed the limitations, risks, security and privacy concerns of performing an evaluation and management service by telephone. I also discussed with the patient that there may be a patient responsible charge related to this service. The patient expressed understanding and agreed to proceed.  Patient Location: Home Provider Location: [facility] (Office)  Participants in virtual visit: - Patient: April [name] Renaldo Fiddler - CMA: Burnell Blanks, CMA - Provider: Dr Althea Charon  ---------------------------------------------------------------------- Chief Complaint  Patient presents with  . Nasal Congestion  . Fatigue    S: Reviewed CMA documentation. I have called patient and gathered additional HPI as follows:  Sinusitis / Congestion Reports that symptoms started 1 week ago, with bilateral sinus pressure and congestion, with productive sputum and cough. Admits some dyspnea. - Tried OTC Tylenol as needed. In past has used Mucinex before, does not have any now. She has maintenance inhalers symbicort, Spiriva for COPD, using these Has Albuterol Denies wheezing No COVID test and she does not leave the house, no contact and says does not think it is covid. Unvaccinated.  Denies any fevers, chills, sweats, body ache, shortness of breath, headache, abdominal pain, diarrhea  Past Medical History:  Diagnosis Date  . Anemia    "years ago"  . Anxiety    takes Ativan daily  . Asthma   . Bipolar 1 disorder (HCC)   . Chronic back pain    HNP  . Constipation    takes Ra [employer] daily  . Degenerative disc disease   . Depression    was on Prozac but taken off by MD for a  couple of weeks  . Depression   . Fibromyalgia    takes Lyrica daily  . Genital herpes   . GERD (gastroesophageal reflux disease)    takes Nexium daily  . Headache(784.0)   . Heart murmur    "grew out of"  . History of bronchitis    last time 15+yrs ago  . History of rheumatic fever   . Hyperlipidemia    takes Simvastatin daily  . Hypertension    takes Coreg daily  . Hypothyroidism    takes Synthroid daily  . Joint pain   . Muscle spasm    takes Flexeril daily as needed  . Nocturia   . Osteoarthritis   . Peripheral edema    takes HCTZ daily  . Pneumonia    hx of;last time 30+yrs ago  . Raynaud's disease   . Rheumatic fever    as a child  . Seasonal allergies    takes Zyrtec daily and uses Dymista as well  . Stroke [facility])    ? 5 years ago. No lasting deficits  . Urinary incontinence   . Weakness    nmbness and tingling-right leg   Social History   Tobacco Use  . Smoking status: Current Some Day Smoker    Packs/day: 1.00    Years: 40.00    Pack years: 40.00    Types: Cigarettes  . Smokeless tobacco: Never Used  Vaping Use  . Vaping Use: Never used  Substance Use Topics  . Alcohol use: No  . Drug use: No    Current Outpatient Medications:  .  amoxicillin-clavulanate (AUGMENTIN) 875-125 MG tablet, Take 1 tablet by mouth 2 (two) times daily. For 10 days, Disp: 20 tablet, Rfl: 0 .  acyclovir (ZOVIRAX) 400 MG tablet, Take 1 tablet (400 mg total) by mouth daily., Disp: 30 tablet, Rfl: 2 .  albuterol (VENTOLIN HFA) 108 (90 Base) MCG/ACT inhaler, Inhale 2 puffs into the lungs every 4 (four) hours as needed for wheezing or shortness of breath. , Disp: , Rfl:  .  cyclobenzaprine (FLEXERIL) 10 MG tablet, Take 10 mg by mouth 3 (three) times daily., Disp: , Rfl:  .  diclofenac (VOLTAREN) 75 MG EC tablet, Take 75 mg by mouth daily as needed for mild pain or moderate pain. , Disp: , Rfl:  .  esomeprazole (NEXIUM) 40 MG capsule, TAKE 1 CAPSULE BY MOUTH ONCE DAILY WITH LARGE  GLASS OF WATER, Disp: 30 capsule, Rfl: 2 .  fluticasone (FLONASE) 50 MCG/ACT nasal spray, Place 1 spray into both nostrils at bedtime as needed for allergies. , Disp: , Rfl:  .  hydrocortisone (ANUSOL-HC) 2.5 % rectal cream, Apply 1 application topically as needed for hemorrhoids. , Disp: , Rfl:  .  hydrOXYzine (ATARAX/VISTARIL) 10 MG tablet, Take 10 mg by mouth 2 (two) times daily., Disp: , Rfl:  .  icosapent Ethyl (VASCEPA) 1 g capsule, Vascepa 1 gram capsule, Disp: , Rfl:  .  levothyroxine (SYNTHROID, LEVOTHROID) 112 MCG tablet, Take 112 mcg by mouth daily before breakfast. , Disp: , Rfl:  .  lubiprostone (AMITIZA) 24 MCG capsule, , Disp: , Rfl:  .  metoprolol succinate (TOPROL-XL) 50 MG 24 hr tablet, Take 1 tablet (50 mg total) by mouth at bedtime., Disp: 90 tablet, Rfl: 1 .  morphine (MS CONTIN) 15 MG 12 hr tablet, Take 15 mg by mouth 3 (three) times daily., Disp: , Rfl:  .  Olopatadine HCl 0.2 % SOLN, Place 1 drop into both eyes at bedtime as needed (allergies)., Disp: , Rfl:  .  omega-3 acid ethyl esters (LOVAZA) 1 g capsule, TAKE TWO CAPSULES BY MOUTH TWICE A DAY, Disp: 120 capsule, Rfl: 2 .  pregabalin (LYRICA) 150 MG capsule, Take 150 mg by mouth 3 (three) times daily., Disp: , Rfl:  .  rosuvastatin (CRESTOR) 40 MG tablet, TAKE 1 TABLET BY MOUTH DAILY, Disp: 90 tablet, Rfl: 0 .  SPIRIVA HANDIHALER 18 MCG inhalation capsule, 1 capsule daily., Disp: , Rfl:  .  SYMBICORT 160-4.5 MCG/ACT inhaler, Inhale into the lungs., Disp: , Rfl:  .  venlafaxine XR (EFFEXOR-XR) 150 MG 24 hr capsule, Take 1 capsule (150 mg total) by mouth every evening. For mood control, Disp: 30 capsule, Rfl: 0 .  ziprasidone (GEODON) 40 MG capsule, Take 40 mg by mouth at bedtime., Disp: , Rfl:   Depression screen PHQ 2/9 07/05/2013  Decreased Interest 1  Down, Depressed, Hopeless 0  PHQ - 2 Score 1    No flowsheet data found.  -------------------------------------------------------------------------- O: No  physical exam performed due to remote telephone encounter.  Lab results reviewed.  No results found for this or any previous visit (from the past 2160 hour(s)).  -------------------------------------------------------------------------- A&P:  Problem List Items Addressed This Visit   None   Visit Diagnoses    Acute non-recurrent frontal sinusitis    -  Primary   Relevant Medications   amoxicillin-clavulanate (AUGMENTIN) 875-125 MG tablet     Clinically with acute sinusitis Secondary lower respiratory symptoms No sign by history of COPD flare or asthma flare Cannot rule out COVID19, unvaccinated. Advised her to  get COVID19 tested and she has declined at this time. May consider COVID home test if she is interested.  For now we will cover empirically for sinusitis with Augmentin BID x 10 days Use albuterol PRN, other maintenance inhalers Recommend OTC Mucinex, Tylenol / NSAID PRN Improve hydration to thin secretions Follow-up if unresolved - can go to Urgent Care if worse or new concerns.  Meds ordered this encounter  Medications  . amoxicillin-clavulanate (AUGMENTIN) 875-125 MG tablet    Sig: Take 1 tablet by mouth 2 (two) times daily. For 10 days    Dispense:  20 tablet    Refill:  0    Follow-up: - Return in 10-14 days if not improved  Patient verbalizes understanding with the above medical recommendations including the limitation of remote medical advice.  Specific follow-up and call-back criteria were given for patient to follow-up or seek medical care more urgently if needed.   - Time spent in direct consultation with patient on phone: 7 minutes   Saralyn Pilar, DO [facility] 06/13/2020, 4:24 PM

## 2020-06-13 NOTE — Patient Instructions (Addendum)
For now we will order Augmentin BID x 10 days Use albuterol PRN, other maintenance inhalers Recommend OTC Mucinex, Tylenol / NSAID PRN Improve hydration to thin secretions Follow-up if unresolved - can go to Urgent Care if worse or new concerns.  Please schedule a Follow-up Appointment to: Return in about 10 days (around 06/23/2020), or if symptoms worsen or fail to improve, for sinusitis.  If you have any other questions or concerns, please feel free to call the office or send a message through MyChart. You may also schedule an earlier appointment if necessary.  Additionally, you may be receiving a survey about your experience at our office within a few days to 1 week by e-mail or mail. We value your feedback.  Saralyn Pilar, DO [facility], [place]

## 2020-07-03 DIAGNOSIS — G894 Chronic pain syndrome: Secondary | ICD-10-CM | POA: Diagnosis not present

## 2020-07-03 DIAGNOSIS — M542 Cervicalgia: Secondary | ICD-10-CM | POA: Diagnosis not present

## 2020-07-05 DIAGNOSIS — Z79891 Long term (current) use of opiate analgesic: Secondary | ICD-10-CM | POA: Diagnosis not present

## 2020-07-05 DIAGNOSIS — G894 Chronic pain syndrome: Secondary | ICD-10-CM | POA: Diagnosis not present

## 2020-07-05 DIAGNOSIS — M542 Cervicalgia: Secondary | ICD-10-CM | POA: Diagnosis not present

## 2020-07-05 DIAGNOSIS — Z79899 Other long term (current) drug therapy: Secondary | ICD-10-CM | POA: Diagnosis not present

## 2020-07-05 DIAGNOSIS — K5903 Drug induced constipation: Secondary | ICD-10-CM | POA: Diagnosis not present

## 2020-07-05 DIAGNOSIS — M5416 Radiculopathy, lumbar region: Secondary | ICD-10-CM | POA: Diagnosis not present

## 2020-07-19 ENCOUNTER — Other Ambulatory Visit: Payer: Self-pay | Admitting: Family Medicine

## 2020-07-19 NOTE — Telephone Encounter (Signed)
Requested medication (s) are due for refill today: historical med  Requested medication (s) are on the active medication list: yes  Last refill:  02/19/2017  Future visit scheduled: yes in 1 month  Notes to clinic:  historical med . Do you want to renew Rx ?     Requested Prescriptions  Pending Prescriptions Disp Refills   SYNTHROID 112 MCG tablet [Pharmacy Med Name: SYNTHROID 112 MCG TAB] 90 tablet     Sig: TAKE 1 TABLET BY MOUTH DAILY 1 HOUR PRIOR FOR BREAKFAST ON AN EMPTY STOMACH      Endocrinology:  Hypothyroid Agents Failed - 07/19/2020 10:53 AM      Failed - TSH needs to be rechecked within 3 months after an abnormal result. Refill until TSH is due.      Failed - TSH in normal range and within 360 days    TSH  Date Value Ref Range Status  08/20/2017 0.329 (L) 0.350 - 4.500 uIU/mL Final    Comment:    Performed by a 3rd Generation assay with a functional sensitivity of <=0.01 uIU/mL. Performed at [facility], [address], [place], [place] [address]   04/03/2014 1.470 0.350 - 4.500 uIU/mL Final          Passed - Valid encounter within last 12 months    Recent Outpatient Visits           1 month ago Acute non-recurrent frontal sinusitis   [facility], Netta Neat, DO   2 months ago Hypertension, unspecified type   [facility], Jodelle Gross, FNP       Future Appointments             In 1 week Althea Charon, Netta Neat, DO [facility], [facility]

## 2020-08-01 ENCOUNTER — Encounter: Payer: Self-pay | Admitting: Family Medicine

## 2020-08-01 ENCOUNTER — Ambulatory Visit
Admission: RE | Admit: 2020-08-01 | Discharge: 2020-08-01 | Disposition: A | Discharge status: Home / Self Care | Payer: Medicare Other | Source: Ambulatory Visit | Attending: Family Medicine | Admitting: Family Medicine

## 2020-08-01 ENCOUNTER — Ambulatory Visit
Admission: RE | Admit: 2020-08-01 | Discharge: 2020-08-01 | Disposition: A | Discharge status: Home / Self Care | Payer: Medicare Other | Attending: Family Medicine | Admitting: Family Medicine

## 2020-08-01 ENCOUNTER — Ambulatory Visit (INDEPENDENT_AMBULATORY_CARE_PROVIDER_SITE_OTHER): Payer: Medicare Other | Admitting: Family Medicine

## 2020-08-01 ENCOUNTER — Other Ambulatory Visit: Payer: Self-pay

## 2020-08-01 VITALS — BP 128/85 | HR 96 | Ht 67.0 in | Wt 197.2 lb

## 2020-08-01 DIAGNOSIS — E559 Vitamin D deficiency, unspecified: Secondary | ICD-10-CM | POA: Diagnosis not present

## 2020-08-01 DIAGNOSIS — M5441 Lumbago with sciatica, right side: Secondary | ICD-10-CM | POA: Diagnosis not present

## 2020-08-01 DIAGNOSIS — E039 Hypothyroidism, unspecified: Secondary | ICD-10-CM

## 2020-08-01 DIAGNOSIS — R059 Cough, unspecified: Secondary | ICD-10-CM | POA: Insufficient documentation

## 2020-08-01 DIAGNOSIS — Z720 Tobacco use: Secondary | ICD-10-CM | POA: Insufficient documentation

## 2020-08-01 DIAGNOSIS — G8929 Other chronic pain: Secondary | ICD-10-CM

## 2020-08-01 DIAGNOSIS — Z01818 Encounter for other preprocedural examination: Secondary | ICD-10-CM | POA: Insufficient documentation

## 2020-08-01 DIAGNOSIS — Z780 Asymptomatic menopausal state: Secondary | ICD-10-CM | POA: Diagnosis not present

## 2020-08-01 DIAGNOSIS — J4540 Moderate persistent asthma, uncomplicated: Secondary | ICD-10-CM | POA: Diagnosis not present

## 2020-08-01 DIAGNOSIS — Z981 Arthrodesis status: Secondary | ICD-10-CM | POA: Diagnosis not present

## 2020-08-01 DIAGNOSIS — R7309 Other abnormal glucose: Secondary | ICD-10-CM | POA: Diagnosis not present

## 2020-08-01 DIAGNOSIS — M545 Low back pain, unspecified: Secondary | ICD-10-CM | POA: Diagnosis not present

## 2020-08-01 DIAGNOSIS — I10 Essential (primary) hypertension: Secondary | ICD-10-CM | POA: Diagnosis not present

## 2020-08-01 DIAGNOSIS — Z1231 Encounter for screening mammogram for malignant neoplasm of breast: Secondary | ICD-10-CM | POA: Diagnosis not present

## 2020-08-01 NOTE — Progress Notes (Signed)
Subjective:    Patient ID: April Soto, female    DOB: 1960/05/07, 60 y.o.   MRN: [id number]  April Soto is a 60 y.o. female presenting on 08/01/2020 for Hypertension  Previous PCP Danielle Rankin, FNP   HPI   Chronic Low Back Pain History of Lumbar DDD with radiculopathy History of surgery with fusion in 2015 Followed by [facility] They have requested Lumbar spine x-ray and DEXA to be done here and sent to them for review.  Hypothyroidism Due for labs.  History of Abnormal glucose Due for lab check  HTN She has no new concern. Today normal BP. Controlled on medication.  Pre-OP Clearance Tobacco Abuse Cough She was clinically diagnosed with COPD. But states she has never been tested. She has history of poor dentition. She was going to have teeth pulled. Needs pre op clearance She is on Spiriva, Symbicort. Doing well without flare up but has occasional bronchitis.  She needs upcoming dental extraction, and will have general anesthesia  Regarding surgical and anesthesia history: - Known history of several major surgeries in past, and has tolerated general anesthesia well without problem, complication or allergy. No family history of problem with anesthesia - Able to tolerate regular exercise up to >4 METs, walking up flight of stairs - No known history of cardiovascular disease. Never had MI or known CAD. - Chronic Asthma vs COPD. Active smoker 1ppd 40 yr - Denies exertional symptoms of chest pain or tightness, apnea, syncopal episodes, palpitations   Past Surgical History:  Procedure Laterality Date  . ABDOMINAL HYSTERECTOMY    . BACK SURGERY    . CHOLECYSTECTOMY    . COLONOSCOPY    . ESOPHAGOGASTRODUODENOSCOPY    . FOOT SURGERY Right 12/2013   ankle, heel and top of foot  . LUMBAR LAMINECTOMY/DECOMPRESSION MICRODISCECTOMY Right 01/14/2013   Procedure: RIGHT LUMBAR FOUR FIVE  LAMINECTOMY/DECOMPRESSION MICRODISCECTOMY 1 LEVEL;  Surgeon: Reinaldo Meeker,  MD;  Location: [facility];  Service: Neurosurgery;  Laterality: Right;  RIGHT L45 microdiskectomy  . OTHER SURGICAL HISTORY     Gastric stapling for weight loss. patient reports done in 1980's  . SEPTOPLASTY    . TONSILLECTOMY    . TUBAL LIGATION        Depression screen [facility] 2/9 07/05/2013  Decreased Interest 1  Down, Depressed, Hopeless 0  PHQ - 2 Score 1    Social History   Tobacco Use  . Smoking status: Current Some Day Smoker    Packs/day: 1.00    Years: 40.00    Pack years: 40.00    Types: Cigarettes  . Smokeless tobacco: Never Used  Vaping Use  . Vaping Use: Never used  Substance Use Topics  . Alcohol use: No  . Drug use: No    Review of Systems Per HPI unless specifically indicated above     Objective:    BP 128/85   Pulse 96   Ht 5\' 7"  (1.702 m)   Wt 197 lb 3.2 oz (89.4 kg)   SpO2 98%   BMI 30.89 kg/m   Wt Readings from Last 3 Encounters:  08/01/20 197 lb 3.2 oz (89.4 kg)  06/13/20 220 lb (99.8 kg)  05/03/20 220 lb (99.8 kg)    Physical Exam Vitals and nursing note reviewed.  Constitutional:      General: She is not in acute distress.    Appearance: She is well-developed. She is not diaphoretic.     Comments: Well-appearing, comfortable, cooperative  HENT:     Head: Normocephalic and atraumatic.  Eyes:     General:        Right eye: No discharge.        Left eye: No discharge.     Conjunctiva/sclera: Conjunctivae normal.  Neck:     Thyroid: No thyromegaly.  Cardiovascular:     Rate and Rhythm: Normal rate and regular rhythm.     Heart sounds: Normal heart sounds. No murmur heard.   Pulmonary:     Effort: Pulmonary effort is normal. No respiratory distress.     Breath sounds: Normal breath sounds. No wheezing or rales.  Musculoskeletal:        General: Normal range of motion.     Cervical back: Normal range of motion and neck supple.     Right lower leg: No edema.     Left lower leg: No edema.     Comments: Uses walker   Lymphadenopathy:     Cervical: No cervical adenopathy.  Skin:    General: Skin is warm and dry.     Findings: No erythema or rash.  Neurological:     Mental Status: She is alert and oriented to person, place, and time.  Psychiatric:        Behavior: Behavior normal.     Comments: Well groomed, good eye contact, normal speech and thoughts       Results for orders placed or performed during the hospital encounter of 08/16/17  Comprehensive metabolic panel  Result Value Ref Range   Sodium 138 135 - 145 mmol/L   Potassium 4.2 3.5 - 5.1 mmol/L   Chloride 105 101 - 111 mmol/L   CO2 26 22 - 32 mmol/L   Glucose, Bld 137 (H) 65 - 99 mg/dL   BUN 8 6 - 20 mg/dL   Creatinine, Ser 6.570.55 0.44 - 1.00 mg/dL   Calcium 8.8 (L) 8.9 - 10.3 mg/dL   Total Protein 6.9 6.5 - 8.1 g/dL   Albumin 3.6 3.5 - 5.0 g/dL   AST 25 15 - 41 U/L   ALT 13 (L) 14 - 54 U/L   Alkaline Phosphatase 67 38 - 126 U/L   Total Bilirubin 0.6 0.3 - 1.2 mg/dL   GFR calc non Af Amer >60 >60 mL/min   GFR calc Af Amer >60 >60 mL/min   Anion gap 7 5 - 15  Ethanol  Result Value Ref Range   Alcohol, Ethyl (B) <10 <10 mg/dL  Salicylate level  Result Value Ref Range   Salicylate Lvl <7.0 2.8 - 30.0 mg/dL  Acetaminophen level  Result Value Ref Range   Acetaminophen (Tylenol), Serum 12 10 - 30 ug/mL  cbc  Result Value Ref Range   WBC 6.7 3.6 - 11.0 K/uL   RBC 4.94 3.80 - 5.20 MIL/uL   Hemoglobin 16.1 (H) 12.0 - 16.0 g/dL   HCT 84.646.5 96.235.0 - 95.247.0 %   MCV 94.0 80.0 - 100.0 fL   MCH 32.6 26.0 - 34.0 pg   MCHC 34.7 32.0 - 36.0 g/dL   RDW 84.113.9 32.411.5 - 40.114.5 %   Platelets 189 150 - 440 K/uL  Urine Drug Screen, Qualitative  Result Value Ref Range   Tricyclic, Ur Screen NONE DETECTED NONE DETECTED   Amphetamines, Ur Screen NONE DETECTED NONE DETECTED   MDMA (Ecstasy)Ur Screen NONE DETECTED NONE DETECTED   Cocaine Metabolite,Ur [place] NONE DETECTED NONE DETECTED   Opiate, Ur Screen POSITIVE (A) NONE DETECTED   Phencyclidine (PCP)  Ur  S NONE DETECTED NONE DETECTED   Cannabinoid 50 Ng, Ur [place] NONE DETECTED NONE DETECTED   Barbiturates, Ur Screen NONE DETECTED NONE DETECTED   Benzodiazepine, Ur Scrn POSITIVE (A) NONE DETECTED   Methadone Scn, Ur NONE DETECTED NONE DETECTED      Assessment & Plan:   Problem List Items Addressed This Visit    Tobacco abuse   Relevant Orders   DG Chest 2 View   S/P lumbar fusion   Relevant Orders   DG Lumbar Spine Complete   Hypothyroidism   Relevant Orders   TSH   T4, free   Essential hypertension   Relevant Orders   CBC with Differential/Platelet   COMPLETE METABOLIC PANEL WITH GFR   Chronic midline low back pain with right-sided sciatica - Primary   Relevant Orders   DG Lumbar Spine Complete   Asthma, chronic    Other Visit Diagnoses    Cough       Relevant Orders   DG Chest 2 View   Postmenopausal estrogen deficiency       Relevant Orders   DG Bone Density   Pre-op examination       Relevant Orders   DG Chest 2 View   Encounter for screening mammogram for malignant neoplasm of breast       Relevant Orders   MM 3D SCREEN BREAST BILATERAL   Abnormal glucose       Relevant Orders   Hemoglobin A1c   Vitamin D deficiency       Relevant Orders   VITAMIN D 25 Hydroxy (Vit-D Deficiency, Fractures)      No orders of the defined types were placed in this encounter.  #Lumbar Spine DDD / Chronic Back Pain S/p Lumbar spinal fusion w/ hardware Prior fall Order X-rays today Lumbar Spine and requested DEXA Bone Density at [facility]  Discussed w/ X-ray to fax results to:  [facility] [address], [address] [place], [place] [address] Ph: [phone] Fax: [phone]  ATTN Egbert Garibaldi PA  #Mammogram Ordered routine screening mammogram, [place]  #Dental Surgery / Pre Op Will have upcoming dental extractions  She will request pre-op clearance form - to be completed. Will check chemistry, labs, A1c CBC, Chest X-ray  #Chronic Asthma vs COPD /  Emphysema Tobacco Abuse  1ppd for 40 year Some symptoms, episodic On Spiriva, Symbicort - seems to help maintain breathing Most likely some mild COPD Will check CXR today Future if indicated can refer to Pulm for PFTs  Other labs including Thyroid panel for management of her hypothyroidism  Orders Placed This Encounter  Procedures  . DG Chest 2 View    Standing Status:   Future    Standing Expiration Date:   08/01/2021    Order Specific Question:   Reason for Exam (SYMPTOM  OR DIAGNOSIS REQUIRED)    Answer:   occasional cough, smoker, pre-op clearance    Order Specific Question:   Preferred imaging location?    Answer:   [facility]  . DG Lumbar Spine Complete    Standing Status:   Future    Standing Expiration Date:   08/01/2021    Order Specific Question:   Reason for Exam (SYMPTOM  OR DIAGNOSIS REQUIRED)    Answer:   recent fall, low back pain, check hardware with history of lumbar DDD, s/p fusion    Order Specific Question:   Preferred imaging location?    Answer:   [facility]  D[facility]    Standing Status:   Future    Standing Expiration Date:   08/01/2021    Order Specific Question:   Reason for Exam (SYMPTOM  OR DIAGNOSIS REQUIRED)    Answer:   postmenopausal estrogen deficiency screening    Order Specific Question:   Preferred imaging location?    Answer:   [facility]  . MM 3D SCREEN BREAST BILATERAL    Standing Status:   Future    Standing Expiration Date:   08/01/2021    Order Specific Question:   Reason for Exam (SYMPTOM  OR DIAGNOSIS REQUIRED)    Answer:   Screening bilateral 3D Mammogram [place]    Order Specific Question:   Preferred imaging location?    Answer:   [facility]  . Hemoglobin A1c    Standing Status:   Future    Standing Expiration Date:   09/12/2020  . CBC with Differential/Platelet    Standing Status:   Future    Standing Expiration Date:   09/12/2020  . COMPLETE METABOLIC PANEL WITH GFR    Standing Status:   Future     Standing Expiration Date:   09/12/2020  . TSH    Standing Status:   Future    Standing Expiration Date:   09/12/2020  . T4, free    Standing Status:   Future    Standing Expiration Date:   09/12/2020  . VITAMIN D 25 Hydroxy (Vit-D Deficiency, Fractures)    Standing Status:   Future    Standing Expiration Date:   09/12/2020      Follow up plan: Return in about 1 week (around 08/08/2020) for 1 week 4/27 at 1130am or latest lab draw only. Follow-up with Rene Kocher in 3 months.   Saralyn Pilar, DO [facility] 08/01/2020, 1:35 PM

## 2020-08-01 NOTE — Patient Instructions (Addendum)
Thank you for coming to the office today.  For DEXA Scan (Bone mineral density) screening for osteoporosis  Call the Imaging Center below anytime to schedule your own appointment now that order has been placed.  [facility] [address] [place], [place] [address] Phone: [phone]  ---- X-rays today Lumbar spine will send results and DEXA results to your pain doctors  Chest x-ray to evaluate before the dental surgery.  DUE for FASTING BLOOD WORK (no food or drink after midnight before the lab appointment, only water or coffee without cream/sugar on the morning of)  SCHEDULE "Lab Only" visit in the morning at the clinic for lab draw in 1 WEEK  - Make sure Lab Only appointment is at about 1 week before your next appointment, so that results will be available  For Lab Results, once available within 2-3 days of blood draw, you can can log in to MyChart online to view your results and a brief explanation. Also, we can discuss results at next follow-up visit.  Please schedule a Follow-up Appointment to: Return in about 1 week (around 08/08/2020) for 1 week 4/27 at 1130am or latest lab draw only. Follow-up with Rene Kocher in 3 months.  If you have any other questions or concerns, please feel free to call the office or send a message through MyChart. You may also schedule an earlier appointment if necessary.  Additionally, you may be receiving a survey about your experience at our office within a few days to 1 week by e-mail or mail. We value your feedback.  Saralyn Pilar, DO [facility], [place]

## 2020-08-03 DIAGNOSIS — M542 Cervicalgia: Secondary | ICD-10-CM | POA: Diagnosis not present

## 2020-08-03 DIAGNOSIS — G894 Chronic pain syndrome: Secondary | ICD-10-CM | POA: Diagnosis not present

## 2020-08-07 ENCOUNTER — Telehealth: Payer: Self-pay

## 2020-08-07 NOTE — Telephone Encounter (Signed)
Patient schedule for Virtual visit with Dr Kirtland Bouchard. Will check urine at lab appt tomorrow.

## 2020-08-07 NOTE — Telephone Encounter (Signed)
Patient has lab appointment tomorrow and would like to know if a urinalysis can be added because she is having burning when she urinates. She thinks she has a UTI.

## 2020-08-08 ENCOUNTER — Telehealth (INDEPENDENT_AMBULATORY_CARE_PROVIDER_SITE_OTHER): Payer: Medicare Other | Admitting: Family Medicine

## 2020-08-08 ENCOUNTER — Encounter: Payer: Self-pay | Admitting: Family Medicine

## 2020-08-08 ENCOUNTER — Telehealth: Payer: Self-pay

## 2020-08-08 ENCOUNTER — Other Ambulatory Visit: Payer: Medicare Other

## 2020-08-08 VITALS — Ht 67.0 in | Wt 197.0 lb

## 2020-08-08 DIAGNOSIS — N3001 Acute cystitis with hematuria: Secondary | ICD-10-CM

## 2020-08-08 DIAGNOSIS — E559 Vitamin D deficiency, unspecified: Secondary | ICD-10-CM | POA: Diagnosis not present

## 2020-08-08 DIAGNOSIS — K029 Dental caries, unspecified: Secondary | ICD-10-CM

## 2020-08-08 DIAGNOSIS — R300 Dysuria: Secondary | ICD-10-CM | POA: Diagnosis not present

## 2020-08-08 DIAGNOSIS — E039 Hypothyroidism, unspecified: Secondary | ICD-10-CM | POA: Diagnosis not present

## 2020-08-08 DIAGNOSIS — R7309 Other abnormal glucose: Secondary | ICD-10-CM | POA: Diagnosis not present

## 2020-08-08 DIAGNOSIS — I10 Essential (primary) hypertension: Secondary | ICD-10-CM | POA: Diagnosis not present

## 2020-08-08 DIAGNOSIS — K089 Disorder of teeth and supporting structures, unspecified: Secondary | ICD-10-CM

## 2020-08-08 LAB — POCT URINALYSIS DIPSTICK
Bilirubin, UA: NEGATIVE
Glucose, UA: NEGATIVE
Ketones, UA: 5
Nitrite, UA: POSITIVE
Protein, UA: NEGATIVE
Spec Grav, UA: 1.015 (ref 1.010–1.025)
Urobilinogen, UA: 0.2 E.U./dL
pH, UA: 5 (ref 5.0–8.0)

## 2020-08-08 MED ORDER — CEPHALEXIN 500 MG PO CAPS
500.0000 mg | ORAL_CAPSULE | Freq: Three times a day (TID) | ORAL | 0 refills | Status: DC
Start: 2020-08-08 — End: 2021-06-13

## 2020-08-08 NOTE — Telephone Encounter (Signed)
Referral ordered  Saralyn Pilar, DO [facility] 08/08/2020, 12:08 PM

## 2020-08-08 NOTE — Progress Notes (Signed)
Unable to reach patient virtually multiple attempts throughout the day. She was in our office temporarily today to leave urine sample.  Results for orders placed or performed in visit on 08/08/20 (from the past 24 hour(s))  POCT Urinalysis Dipstick     Status: Abnormal   Collection Time: 08/08/20 11:40 AM  Result Value Ref Range   Color, UA yellow    Clarity, UA turbid    Glucose, UA Negative Negative   Bilirubin, UA neg    Ketones, UA 5 trace    Spec Grav, UA 1.015 1.010 - 1.025   Blood, UA large 3+    pH, UA 5.0 5.0 - 8.0   Protein, UA Negative Negative   Urobilinogen, UA 0.2 0.2 or 1.0 E.U./dL   Nitrite, UA pos    Leukocytes, UA Large (3+) (A) Negative   Appearance turbid    Odor foul    Sent Urine Culture  Due to identification of abnormal urine and symptoms, I will agree to go ahead and send antibiotic Keflex 500mg  TID for 7 days to treat this. However, we were unfortunately unable to discuss her symptoms and provide medical evaluation today. This is a courtesy rx to treat this UTI and she will need to follow-up to have actual virtual or office visit completed for any further management of this problem.  If symptoms get worse or severe concerns, she should seek care immediately at hospital ED or UC sooner.  , DO [facility] 08/08/2020, 4:45 PM

## 2020-08-08 NOTE — Telephone Encounter (Signed)
Copied from CRM [id number]. Topic: General - Other >> Aug 07, 2020  9:30 AM Tamela Oddi wrote: Reason for CRM: Patient called to give the fax # for the dentist that she would like to be referred to.  She stated she spoke with someone yesterday and they told her to call back with the fax #.  It is [phone], [facility].  If there are any questions, please cal patient at [phone]

## 2020-08-09 LAB — CBC WITH DIFFERENTIAL/PLATELET
Absolute Monocytes: 1018 cells/uL — ABNORMAL HIGH (ref 200–950)
Basophils Absolute: 10 cells/uL (ref 0–200)
Basophils Relative: 0.1 %
Eosinophils Absolute: 106 cells/uL (ref 15–500)
Eosinophils Relative: 1.1 %
HCT: 54.5 % — ABNORMAL HIGH (ref 35.0–45.0)
Hemoglobin: 18.1 g/dL — ABNORMAL HIGH (ref 11.7–15.5)
Lymphs Abs: 3619 cells/uL (ref 850–3900)
MCH: 31.7 pg (ref 27.0–33.0)
MCHC: 33.2 g/dL (ref 32.0–36.0)
MCV: 95.4 fL (ref 80.0–100.0)
MPV: 12.5 fL (ref 7.5–12.5)
Monocytes Relative: 10.6 %
Neutro Abs: 4848 cells/uL (ref 1500–7800)
Neutrophils Relative %: 50.5 %
Platelets: 172 10*3/uL (ref 140–400)
RBC: 5.71 10*6/uL — ABNORMAL HIGH (ref 3.80–5.10)
RDW: 13.4 % (ref 11.0–15.0)
Total Lymphocyte: 37.7 %
WBC: 9.6 10*3/uL (ref 3.8–10.8)

## 2020-08-09 LAB — COMPLETE METABOLIC PANEL WITH GFR
AG Ratio: 1.3 (calc) (ref 1.0–2.5)
ALT: 11 U/L (ref 6–29)
AST: 19 U/L (ref 10–35)
Albumin: 3.6 g/dL (ref 3.6–5.1)
Alkaline phosphatase (APISO): 100 U/L (ref 37–153)
BUN/Creatinine Ratio: 9 (calc) (ref 6–22)
BUN: 5 mg/dL — ABNORMAL LOW (ref 7–25)
CO2: 30 mmol/L (ref 20–32)
Calcium: 9.3 mg/dL (ref 8.6–10.4)
Chloride: 101 mmol/L (ref 98–110)
Creat: 0.54 mg/dL (ref 0.50–0.99)
GFR, Est African American: 118 mL/min/{1.73_m2} (ref >=60)
GFR, Est Non African American: 102 mL/min/{1.73_m2} (ref >=60)
Globulin: 2.7 g/dL (calc) (ref 1.9–3.7)
Glucose, Bld: 103 mg/dL — ABNORMAL HIGH (ref 65–99)
Potassium: 3.4 mmol/L — ABNORMAL LOW (ref 3.5–5.3)
Sodium: 140 mmol/L (ref 135–146)
Total Bilirubin: 1.2 mg/dL (ref 0.2–1.2)
Total Protein: 6.3 g/dL (ref 6.1–8.1)

## 2020-08-09 LAB — T4, FREE: Free T4: 1.8 ng/dL (ref 0.8–1.8)

## 2020-08-09 LAB — VITAMIN D 25 HYDROXY (VIT D DEFICIENCY, FRACTURES): Vit D, 25-Hydroxy: 79 ng/mL (ref 30–100)

## 2020-08-09 LAB — HEMOGLOBIN A1C
Hgb A1c MFr Bld: 5.1 % of total Hgb (ref <5.7)
Mean Plasma Glucose: 100 mg/dL
eAG (mmol/L): 5.5 mmol/L

## 2020-08-09 LAB — TSH: TSH: 0.08 mIU/L — ABNORMAL LOW (ref 0.40–4.50)

## 2020-08-10 LAB — URINE CULTURE
MICRO NUMBER:: 11821895
SPECIMEN QUALITY:: ADEQUATE

## 2020-08-10 MED ORDER — SYNTHROID 100 MCG PO TABS: 100.0000 ug | ORAL_TABLET | Freq: Every day | ORAL | 1 refills | Status: DC

## 2020-08-10 NOTE — Addendum Note (Signed)
Addended by: Smitty Cords on: 08/10/2020 06:08 PM   Modules accepted: Orders

## 2020-08-13 ENCOUNTER — Telehealth: Payer: Self-pay | Admitting: Family Medicine

## 2020-08-13 NOTE — Telephone Encounter (Signed)
Can you forward to our referral coordinator? I am not quite sure what else the patient needs at this point.  Thanks  Saralyn Pilar, DO [facility] 08/13/2020, 11:01 AM

## 2020-08-13 NOTE — Telephone Encounter (Signed)
April Soto is calling from Clinical biochemist in [place]. But it should have gone to the [facility] office. Not able to accept the referral the pt needs to go to a general dentist. [phone]

## 2020-08-20 NOTE — Telephone Encounter (Signed)
She can be scheduled w/ Rene Kocher. I have seen her for several acutes in past 2 months prior to [place] starting.  Her last visit virtual for UTI she never completed. We sent in medicine and did not see her for visit.  She may go to urgent too if needed if cannot get in soon enough here and needs more prompt evaluation.  Saralyn Pilar, DO [facility] 08/20/2020, 4:33 PM

## 2020-08-20 NOTE — Telephone Encounter (Signed)
Copied from CRM [id number]. Topic: General - Other >> Aug 20, 2020  3:37 PM April Soto wrote: Reason for CRM: Patient called to ask the nurse or doctor to send in a script for her recurring UTI.  She stated that it feels like it is coming back.  Please advise and call patient to confirm at [phone]   Do you want her to be seen?

## 2020-08-22 NOTE — Telephone Encounter (Signed)
The pt have an appt scheduled with Rene Kocher on Monday.

## 2020-08-27 ENCOUNTER — Telehealth: Payer: Medicare Other | Admitting: Internal Medicine

## 2020-08-28 ENCOUNTER — Other Ambulatory Visit: Payer: Self-pay | Admitting: Family Medicine

## 2020-08-28 DIAGNOSIS — E785 Hyperlipidemia, unspecified: Secondary | ICD-10-CM

## 2020-08-28 NOTE — Telephone Encounter (Signed)
Requested medication (s) are due for refill today: Yes  Requested medication (s) are on the active medication list: Yes  Last refill:  05/15/20  Future visit scheduled: No  Notes to clinic:  Unable to refill per protocol due to failed labs, no updated results.      Requested Prescriptions  Pending Prescriptions Disp Refills   rosuvastatin (CRESTOR) 40 MG tablet 90 tablet 0    Sig: Take 1 tablet (40 mg total) by mouth daily.      Cardiovascular:  Antilipid - Statins Failed - 08/28/2020  1:15 PM      Failed - Total Cholesterol in normal range and within 360 days    No results found for: CHOL, POCCHOL, CHOLTOT        Failed - LDL in normal range and within 360 days    No results found for: LDLCALC, LDLC, HIRISKLDL, POCLDL, LDLDIRECT, REALLDLC, TOTLDLC        Failed - HDL in normal range and within 360 days    No results found for: HDL, POCHDL        Failed - Triglycerides in normal range and within 360 days    No results found for: TRIG, POCTRIG        Passed - Patient is not pregnant      Passed - Valid encounter within last 12 months    Recent Outpatient Visits           2 weeks ago Acute cystitis with hematuria   [facility] [place], Netta Neat, DO   3 weeks ago Chronic midline low back pain with right-sided sciatica   [facility] [place], Netta Neat, DO   2 months ago Acute non-recurrent frontal sinusitis   [facility] [place], Netta Neat, DO   3 months ago Hypertension, unspecified type   [facility], Jodelle [place], [place]

## 2020-08-28 NOTE — Telephone Encounter (Signed)
Copied from CRM [id number]. Topic: Quick Communication - Rx Refill/Question >> Aug 28, 2020 12:59 PM Gaetana Michaelis A wrote: Medication: rosuvastatin (CRESTOR) 40 MG tablet   Has the patient contacted their pharmacy? Yes. Patient has contacted their pharmacy unsuccessfully and contacted their insurance provider for further assistance  Preferred Pharmacy (with phone number or street name): [facility] [place], [place] - [address]  Phone:  [phone] Fax:  [phone]  Agent: Please be advised that RX refills may take up to 3 business days. We ask that you follow-up with your pharmacy.

## 2020-08-29 ENCOUNTER — Other Ambulatory Visit: Payer: Self-pay | Admitting: Internal Medicine

## 2020-08-29 DIAGNOSIS — E7800 Pure hypercholesterolemia, unspecified: Secondary | ICD-10-CM

## 2020-08-29 MED ORDER — ROSUVASTATIN CALCIUM 40 MG PO TABS
40.0000 mg | ORAL_TABLET | Freq: Every day | ORAL | 0 refills | Status: DC
Start: 2020-08-29 — End: 2021-01-11

## 2020-08-29 NOTE — Telephone Encounter (Signed)
There is no lipid profile on file. She needs to schedule lab only appt to get this done.

## 2020-08-29 NOTE — Progress Notes (Signed)
lipid

## 2020-08-30 DIAGNOSIS — K5903 Drug induced constipation: Secondary | ICD-10-CM | POA: Diagnosis not present

## 2020-08-30 DIAGNOSIS — Z79899 Other long term (current) drug therapy: Secondary | ICD-10-CM | POA: Diagnosis not present

## 2020-08-30 DIAGNOSIS — M542 Cervicalgia: Secondary | ICD-10-CM | POA: Diagnosis not present

## 2020-08-30 DIAGNOSIS — Z79891 Long term (current) use of opiate analgesic: Secondary | ICD-10-CM | POA: Diagnosis not present

## 2020-08-30 DIAGNOSIS — M5416 Radiculopathy, lumbar region: Secondary | ICD-10-CM | POA: Diagnosis not present

## 2020-08-30 DIAGNOSIS — G894 Chronic pain syndrome: Secondary | ICD-10-CM | POA: Diagnosis not present

## 2020-09-02 DIAGNOSIS — M542 Cervicalgia: Secondary | ICD-10-CM | POA: Diagnosis not present

## 2020-09-02 DIAGNOSIS — G894 Chronic pain syndrome: Secondary | ICD-10-CM | POA: Diagnosis not present

## 2020-09-13 ENCOUNTER — Telehealth: Payer: Self-pay

## 2020-09-13 DIAGNOSIS — G8929 Other chronic pain: Secondary | ICD-10-CM

## 2020-09-13 DIAGNOSIS — M5441 Lumbago with sciatica, right side: Secondary | ICD-10-CM

## 2020-09-13 NOTE — Telephone Encounter (Signed)
Copied from CRM [id number]. Topic: Referral - Request for Referral >> Sep 13, 2020 10:59 AM Marylen Ponto wrote: Has patient seen PCP for this complaint? No - Pt pain doctor advised pt to request referral from pcp *If NO, is insurance requiring patient see PCP for this issue before PCP can refer them? Referral for which specialty:  Preferred provider/office: [facility] Reason for referral: Pt requests spinal injection

## 2020-09-13 NOTE — Addendum Note (Signed)
Addended by: Lorre Munroe on: 09/13/2020 12:22 PM   Modules accepted: Orders

## 2020-09-13 NOTE — Telephone Encounter (Signed)
Referral placed.

## 2020-09-27 ENCOUNTER — Other Ambulatory Visit: Payer: Self-pay | Admitting: Family Medicine

## 2020-09-27 DIAGNOSIS — A6000 Herpesviral infection of urogenital system, unspecified: Secondary | ICD-10-CM

## 2020-09-27 DIAGNOSIS — E7800 Pure hypercholesterolemia, unspecified: Secondary | ICD-10-CM

## 2020-09-28 ENCOUNTER — Other Ambulatory Visit: Payer: Self-pay | Admitting: Family Medicine

## 2020-09-28 NOTE — Telephone Encounter (Signed)
  Notes to clinic: medication filled by a historical provider  Review for refill    Requested Prescriptions  Pending Prescriptions Disp Refills   ziprasidone (GEODON) 40 MG capsule [Pharmacy Med Name: ZIPRASIDONE HCL 40 MG CAP] 30 capsule     Sig: TAKE 1 CAPSULE BY MOUTH EVERY EVENING WITH FOOD FOR MOOD      Not Delegated - Psychiatry:  Antipsychotics - Second Generation (Atypical) - ziprasidone Failed - 09/28/2020  9:36 AM      Failed - This refill cannot be delegated      Passed - Valid encounter within last 6 months    Recent Outpatient Visits           1 month ago Acute cystitis with hematuria   [facility] [place], Netta Neat, DO   1 month ago Chronic midline low back pain with right-sided sciatica   [facility] [place], Netta Neat, DO   3 months ago Acute non-recurrent frontal sinusitis   [facility] [place], Netta Neat, DO   4 months ago Hypertension, unspecified type   [facility], Jodelle [place], [place]

## 2020-10-03 DIAGNOSIS — G894 Chronic pain syndrome: Secondary | ICD-10-CM | POA: Diagnosis not present

## 2020-10-03 DIAGNOSIS — M542 Cervicalgia: Secondary | ICD-10-CM | POA: Diagnosis not present

## 2020-10-04 ENCOUNTER — Telehealth: Payer: Self-pay

## 2020-10-04 NOTE — Telephone Encounter (Signed)
LVOM for Chiwanda to return my call

## 2020-10-04 NOTE — Telephone Encounter (Signed)
Copied from CRM [id number]. Topic: Referral - Status >> Sep 27, 2020  2:02 PM Marylen Ponto wrote: Reason for CRM: Chiwanda with [facility] request that Nicki Reaper call her back at [phone]

## 2020-10-05 ENCOUNTER — Telehealth: Payer: Self-pay

## 2020-10-05 NOTE — Telephone Encounter (Signed)
Copied from CRM [id number]. Topic: General - Other >> Oct 04, 2020  2:44 PM Pawlus, Maxine Glenn A wrote: Reason for CRM:  [facility] - [place] Otto [phone] needs more information regarding the refferal that was sent over. Caller was asking for further notes along with any MRIs the pt may have.

## 2020-10-24 ENCOUNTER — Other Ambulatory Visit: Payer: Self-pay | Admitting: Family Medicine

## 2020-10-24 DIAGNOSIS — K219 Gastro-esophageal reflux disease without esophagitis: Secondary | ICD-10-CM

## 2020-10-24 NOTE — Telephone Encounter (Signed)
Requested medication (s) are due for refill today - yes  Requested medication (s) are on the active medication list -yes  Future visit scheduled -no  Last refill: 08/01/20  Notes to clinic: Patient has not made est care appointment with NP at office as requested- sent for review of RF request  Requested Prescriptions  Pending Prescriptions Disp Refills   esomeprazole (NEXIUM) 40 MG capsule [Pharmacy Med Name: ESOMEPRAZOLE MAGNESIUM 40 MG CAP] 30 capsule 2    Sig: TAKE 1 CAPSULE BY MOUTH ONCE DAILY WITH LARGE GLASS OF WATER      Gastroenterology: Proton Pump Inhibitors Passed - 10/24/2020  3:46 PM      Passed - Valid encounter within last 12 months    Recent Outpatient Visits           2 months ago Acute cystitis with hematuria   [facility] Smitty Cords, DO   2 months ago Chronic midline low back pain with right-sided sciatica   [facility] [place], Netta Neat, DO   4 months ago Acute non-recurrent frontal sinusitis   [facility] [place], Netta Neat, DO   5 months ago Hypertension, unspecified type   [facility], Jodelle [place], [place]                    Requested Prescriptions  Pending Prescriptions Disp Refills   esomeprazole (NEXIUM) 40 MG capsule [Pharmacy Med Name: ESOMEPRAZOLE MAGNESIUM 40 MG CAP] 30 capsule 2    Sig: TAKE 1 CAPSULE BY MOUTH ONCE DAILY WITH LARGE GLASS OF WATER      Gastroenterology: Proton Pump Inhibitors Passed - 10/24/2020  3:46 PM      Passed - Valid encounter within last 12 months    Recent Outpatient Visits           2 months ago Acute cystitis with hematuria   [facility] [place], Netta Neat, DO   2 months ago Chronic midline low back pain with right-sided sciatica   [facility] [place], Netta Neat, DO   4 months ago Acute non-recurrent frontal sinusitis   [facility] [place], Netta Neat,  DO   5 months ago Hypertension, unspecified type   [facility], [place], [place]

## 2020-10-25 ENCOUNTER — Other Ambulatory Visit: Payer: Self-pay | Admitting: Internal Medicine

## 2020-10-25 DIAGNOSIS — I10 Essential (primary) hypertension: Secondary | ICD-10-CM

## 2020-10-25 NOTE — Telephone Encounter (Signed)
  Notes to clinic:  Medication filled by a historical provider  Review for refill Last seen by Danielle Rankin    Requested Prescriptions  Pending Prescriptions Disp Refills   SYMBICORT 160-4.5 MCG/ACT inhaler [Pharmacy Med Name: SYMBICORT 160-4.5 MCG/ACT INH AERO] 10.2 g     Sig: INHALE 2 PUFFS BY MOUTH TWICE DAILY (MAINTENANCE INHALER). RINSE MOUTH WELL AFTER USE TO PREVENT THRUSH.      Pulmonology:  Combination Products Passed - 10/25/2020  9:17 AM      Passed - Valid encounter within last 12 months    Recent Outpatient Visits           2 months ago Acute cystitis with hematuria   [facility] [place], Netta Neat, DO   2 months ago Chronic midline low back pain with right-sided sciatica   [facility] Smitty Cords, DO   4 months ago Acute non-recurrent frontal sinusitis   [facility] [place], Netta Neat, DO   5 months ago Hypertension, unspecified type   [facility], [place], FNP                  metoprolol succinate (TOPROL-XL) 50 MG 24 hr tablet [Pharmacy Med Name: METOPROLOL SUCCINATE ER 50 MG TAB] 90 tablet 1    Sig: TAKE 1 TABLET BY MOUTH AT BEDTIME      Cardiovascular:  Beta Blockers Passed - 10/25/2020  9:17 AM      Passed - Last BP in normal range    BP Readings from Last 1 Encounters:  08/01/20 128/85          Passed - Last Heart Rate in normal range    Pulse Readings from Last 1 Encounters:  08/01/20 96          Passed - Valid encounter within last 6 months    Recent Outpatient Visits           2 months ago Acute cystitis with hematuria   [facility] Smitty Cords, DO   2 months ago Chronic midline low back pain with right-sided sciatica   [facility] [place], Netta Neat, DO   4 months ago Acute non-recurrent frontal sinusitis   [facility] [place], Netta Neat, DO   5 months ago Hypertension,  unspecified type   [facility], [place], [place]

## 2020-10-26 ENCOUNTER — Telehealth: Payer: Self-pay

## 2020-10-30 NOTE — Telephone Encounter (Signed)
Error

## 2020-11-02 DIAGNOSIS — G894 Chronic pain syndrome: Secondary | ICD-10-CM | POA: Diagnosis not present

## 2020-11-02 DIAGNOSIS — M542 Cervicalgia: Secondary | ICD-10-CM | POA: Diagnosis not present

## 2020-11-07 ENCOUNTER — Other Ambulatory Visit: Payer: Self-pay | Admitting: Family Medicine

## 2020-11-07 DIAGNOSIS — E7800 Pure hypercholesterolemia, unspecified: Secondary | ICD-10-CM

## 2020-11-07 NOTE — Telephone Encounter (Signed)
Requested medications are due for refill today.  yes  Requested medications are on the active medications list.  yes  Last refill. 09/27/2020  Future visit scheduled.   no  Notes to clinic.  Danielle Rankin is listed as PCP.

## 2020-11-21 ENCOUNTER — Telehealth: Payer: Self-pay | Admitting: Family Medicine

## 2020-11-21 NOTE — Telephone Encounter (Signed)
Patient states that she needs to have a repeat urine test done. Patient still having symptoms after treatment. CB: [phone]

## 2020-11-29 DIAGNOSIS — K5903 Drug induced constipation: Secondary | ICD-10-CM | POA: Diagnosis not present

## 2020-11-29 DIAGNOSIS — Z79899 Other long term (current) drug therapy: Secondary | ICD-10-CM | POA: Diagnosis not present

## 2020-11-29 DIAGNOSIS — M5416 Radiculopathy, lumbar region: Secondary | ICD-10-CM | POA: Diagnosis not present

## 2020-11-29 DIAGNOSIS — G894 Chronic pain syndrome: Secondary | ICD-10-CM | POA: Diagnosis not present

## 2020-11-29 DIAGNOSIS — Z79891 Long term (current) use of opiate analgesic: Secondary | ICD-10-CM | POA: Diagnosis not present

## 2020-11-29 DIAGNOSIS — M542 Cervicalgia: Secondary | ICD-10-CM | POA: Diagnosis not present

## 2020-12-03 DIAGNOSIS — M542 Cervicalgia: Secondary | ICD-10-CM | POA: Diagnosis not present

## 2020-12-03 DIAGNOSIS — G894 Chronic pain syndrome: Secondary | ICD-10-CM | POA: Diagnosis not present

## 2020-12-13 DIAGNOSIS — M542 Cervicalgia: Secondary | ICD-10-CM | POA: Diagnosis not present

## 2020-12-13 DIAGNOSIS — K5903 Drug induced constipation: Secondary | ICD-10-CM | POA: Diagnosis not present

## 2020-12-13 DIAGNOSIS — G894 Chronic pain syndrome: Secondary | ICD-10-CM | POA: Diagnosis not present

## 2020-12-13 DIAGNOSIS — Z79891 Long term (current) use of opiate analgesic: Secondary | ICD-10-CM | POA: Diagnosis not present

## 2020-12-13 DIAGNOSIS — M5416 Radiculopathy, lumbar region: Secondary | ICD-10-CM | POA: Diagnosis not present

## 2020-12-20 ENCOUNTER — Telehealth: Payer: Self-pay | Admitting: Family Medicine

## 2020-12-20 NOTE — Telephone Encounter (Signed)
N/A-Unable to leave a message for patient to call back and schedule Medicare Annual Wellness Visit (AWV) to be done virtually or by telephone.  No hx of AWV eligible as of 12/14/11  Please schedule at anytime with [facility].      40 Minutes appointment   Any questions, please call me at [phone]

## 2020-12-24 ENCOUNTER — Other Ambulatory Visit: Payer: Self-pay | Admitting: Internal Medicine

## 2020-12-24 DIAGNOSIS — E7800 Pure hypercholesterolemia, unspecified: Secondary | ICD-10-CM

## 2020-12-24 NOTE — Telephone Encounter (Signed)
Requested medications are due for refill today.  yes  Requested medications are on the active medications list.  yes  Last refill. 11/08/2020  Future visit scheduled.   no  Notes to clinic.  PCP is listed as Danielle Rankin.

## 2021-01-03 DIAGNOSIS — M542 Cervicalgia: Secondary | ICD-10-CM | POA: Diagnosis not present

## 2021-01-03 DIAGNOSIS — G894 Chronic pain syndrome: Secondary | ICD-10-CM | POA: Diagnosis not present

## 2021-01-11 ENCOUNTER — Other Ambulatory Visit: Payer: Self-pay | Admitting: Internal Medicine

## 2021-01-11 ENCOUNTER — Other Ambulatory Visit: Payer: Self-pay | Admitting: Family Medicine

## 2021-01-11 DIAGNOSIS — E785 Hyperlipidemia, unspecified: Secondary | ICD-10-CM

## 2021-01-11 DIAGNOSIS — A6000 Herpesviral infection of urogenital system, unspecified: Secondary | ICD-10-CM

## 2021-01-11 NOTE — Telephone Encounter (Signed)
Requested medication (s) are due for refill today: Yes  Requested medication (s) are on the active medication list: Yes  Last refill:  08/29/20  Future visit scheduled: No  Notes to clinic:  Protocol indicates pt. Needs lab work.    Requested Prescriptions  Pending Prescriptions Disp Refills   rosuvastatin (CRESTOR) 40 MG tablet [Pharmacy Med Name: ROSUVASTATIN CALCIUM 40 MG TAB] 90 tablet 0    Sig: TAKE 1 TABLET BY MOUTH DAILY     Cardiovascular:  Antilipid - Statins Failed - 01/11/2021 10:50 AM      Failed - Total Cholesterol in normal range and within 360 days    No results found for: CHOL, POCCHOL, CHOLTOT        Failed - LDL in normal range and within 360 days    No results found for: LDLCALC, LDLC, HIRISKLDL, POCLDL, LDLDIRECT, REALLDLC, TOTLDLC        Failed - HDL in normal range and within 360 days    No results found for: HDL, POCHDL        Failed - Triglycerides in normal range and within 360 days    No results found for: TRIG, POCTRIG        Passed - Patient is not pregnant      Passed - Valid encounter within last 12 months    Recent Outpatient Visits           5 months ago Acute cystitis with hematuria   [facility] [place], Netta Neat, DO   5 months ago Chronic midline low back pain with right-sided sciatica   [facility] Smitty Cords, DO   7 months ago Acute non-recurrent frontal sinusitis   [facility] [place], Netta Neat, DO   8 months ago Hypertension, unspecified type   [facility], [place], [place]

## 2021-01-18 ENCOUNTER — Other Ambulatory Visit: Payer: Self-pay | Admitting: Family Medicine

## 2021-01-18 NOTE — Telephone Encounter (Signed)
Requested medications are due for refill today yes  Requested medications are on the active medication list yes  Last refill 12/04/20  Last visit 08/01/20  Future visit scheduled No  Notes to clinic Historical Provider, please assess.

## 2021-01-23 DIAGNOSIS — M542 Cervicalgia: Secondary | ICD-10-CM | POA: Diagnosis not present

## 2021-01-23 DIAGNOSIS — Z79899 Other long term (current) drug therapy: Secondary | ICD-10-CM | POA: Diagnosis not present

## 2021-01-23 DIAGNOSIS — K5903 Drug induced constipation: Secondary | ICD-10-CM | POA: Diagnosis not present

## 2021-01-23 DIAGNOSIS — M545 Low back pain, unspecified: Secondary | ICD-10-CM | POA: Diagnosis not present

## 2021-01-23 DIAGNOSIS — G894 Chronic pain syndrome: Secondary | ICD-10-CM | POA: Diagnosis not present

## 2021-01-23 DIAGNOSIS — Z79891 Long term (current) use of opiate analgesic: Secondary | ICD-10-CM | POA: Diagnosis not present

## 2021-01-23 DIAGNOSIS — M5416 Radiculopathy, lumbar region: Secondary | ICD-10-CM | POA: Diagnosis not present

## 2021-02-02 DIAGNOSIS — G894 Chronic pain syndrome: Secondary | ICD-10-CM | POA: Diagnosis not present

## 2021-02-02 DIAGNOSIS — M542 Cervicalgia: Secondary | ICD-10-CM | POA: Diagnosis not present

## 2021-02-12 ENCOUNTER — Other Ambulatory Visit: Payer: Self-pay | Admitting: Internal Medicine

## 2021-02-12 DIAGNOSIS — E7800 Pure hypercholesterolemia, unspecified: Secondary | ICD-10-CM

## 2021-02-12 NOTE — Telephone Encounter (Signed)
Requested medication (s) are due for refill today:   Yes  Requested medication (s) are on the active medication list:   Yes  Future visit scheduled:   No   Last ordered: 12/25/2020 #60, 0 refills  Returned because been assigned to [facility] however Rene Kocher has not seen this pt yet.      Requested Prescriptions  Pending Prescriptions Disp Refills   omega-3 acid ethyl esters (LOVAZA) 1 g capsule [Pharmacy Med Name: OMEGA-3-ACID ETHYL ESTERS 1 GM CAP] 60 capsule 0    Sig: TAKE TWO CAPSULES BY MOUTH TWICE A DAY     Endocrinology:  Nutritional Agents Passed - 02/12/2021 12:05 PM      Passed - Valid encounter within last 12 months    Recent Outpatient Visits           6 months ago Acute cystitis with hematuria   [facility] Smitty Cords, DO   6 months ago Chronic midline low back pain with right-sided sciatica   [facility] Smitty Cords, DO   8 months ago Acute non-recurrent frontal sinusitis   [facility] [place], Netta Neat, DO   9 months ago Hypertension, unspecified type   [facility], [place], [place]

## 2021-03-05 DIAGNOSIS — G894 Chronic pain syndrome: Secondary | ICD-10-CM | POA: Diagnosis not present

## 2021-03-05 DIAGNOSIS — M542 Cervicalgia: Secondary | ICD-10-CM | POA: Diagnosis not present

## 2021-03-11 ENCOUNTER — Other Ambulatory Visit: Payer: Self-pay | Admitting: Family Medicine

## 2021-03-11 DIAGNOSIS — E039 Hypothyroidism, unspecified: Secondary | ICD-10-CM

## 2021-03-12 ENCOUNTER — Other Ambulatory Visit: Payer: Self-pay | Admitting: Family Medicine

## 2021-03-12 DIAGNOSIS — E039 Hypothyroidism, unspecified: Secondary | ICD-10-CM

## 2021-03-12 NOTE — Telephone Encounter (Signed)
Requested medications are due for refill today No, filled for 90 02/12/21  Requested medications are on the active medication list yes  Last refill 02/12/21  Last visit 08/07/20  Future visit scheduled no  Notes to clinic requesting too soon, please assess. Requested Prescriptions  Pending Prescriptions Disp Refills   SYNTHROID 100 MCG tablet [Pharmacy Med Name: SYNTHROID 100 MCG TAB] 90 tablet 1    Sig: TAKE 1 TABLET BY MOUTH DAILY BEFORE BREAKFAST     Endocrinology:  Hypothyroid Agents Failed - 03/11/2021 11:59 AM      Failed - TSH needs to be rechecked within 3 months after an abnormal result. Refill until TSH is due.      Failed - TSH in normal range and within 360 days    TSH  Date Value Ref Range Status  08/08/2020 0.08 (L) 0.40 - 4.50 mIU/L Final          Passed - Valid encounter within last 12 months    Recent Outpatient Visits           7 months ago Acute cystitis with hematuria   [facility] [place], Netta Neat, DO   7 months ago Chronic midline low back pain with right-sided sciatica   [facility] Smitty Cords, DO   9 months ago Acute non-recurrent frontal sinusitis   [facility] [place], Netta Neat, DO   10 months ago Hypertension, unspecified type   [facility], [place], [place]

## 2021-03-14 ENCOUNTER — Other Ambulatory Visit: Payer: Self-pay

## 2021-03-14 DIAGNOSIS — E7800 Pure hypercholesterolemia, unspecified: Secondary | ICD-10-CM

## 2021-03-15 ENCOUNTER — Other Ambulatory Visit: Payer: Self-pay

## 2021-03-15 ENCOUNTER — Other Ambulatory Visit: Payer: Medicare Other

## 2021-03-15 DIAGNOSIS — E7800 Pure hypercholesterolemia, unspecified: Secondary | ICD-10-CM | POA: Diagnosis not present

## 2021-03-16 LAB — LIPID PANEL
Cholesterol: 136 mg/dL (ref <200)
HDL: 44 mg/dL — ABNORMAL LOW (ref >=50)
LDL Cholesterol (Calc): 69 mg/dL (calc)
Non-HDL Cholesterol (Calc): 92 mg/dL (calc) (ref <130)
Total CHOL/HDL Ratio: 3.1 (calc) (ref <5.0)
Triglycerides: 155 mg/dL — ABNORMAL HIGH (ref <150)

## 2021-03-17 DIAGNOSIS — R300 Dysuria: Secondary | ICD-10-CM | POA: Diagnosis not present

## 2021-03-20 DIAGNOSIS — G894 Chronic pain syndrome: Secondary | ICD-10-CM | POA: Diagnosis not present

## 2021-03-20 DIAGNOSIS — M545 Low back pain, unspecified: Secondary | ICD-10-CM | POA: Diagnosis not present

## 2021-03-20 DIAGNOSIS — M542 Cervicalgia: Secondary | ICD-10-CM | POA: Diagnosis not present

## 2021-03-20 DIAGNOSIS — K5903 Drug induced constipation: Secondary | ICD-10-CM | POA: Diagnosis not present

## 2021-03-20 DIAGNOSIS — M5416 Radiculopathy, lumbar region: Secondary | ICD-10-CM | POA: Diagnosis not present

## 2021-03-20 DIAGNOSIS — Z79891 Long term (current) use of opiate analgesic: Secondary | ICD-10-CM | POA: Diagnosis not present

## 2021-04-04 DIAGNOSIS — G894 Chronic pain syndrome: Secondary | ICD-10-CM | POA: Diagnosis not present

## 2021-04-04 DIAGNOSIS — M542 Cervicalgia: Secondary | ICD-10-CM | POA: Diagnosis not present

## 2021-04-17 ENCOUNTER — Other Ambulatory Visit: Payer: Self-pay | Admitting: Internal Medicine

## 2021-04-17 DIAGNOSIS — A6000 Herpesviral infection of urogenital system, unspecified: Secondary | ICD-10-CM

## 2021-04-18 NOTE — Telephone Encounter (Signed)
Requested medications are due for refill today.  yes  Requested medications are on the active medications list.  yes  Last refill. 01/11/2021  Future visit scheduled.   no  Notes to clinic.  Pt was last seen in office 08/08/2020. Pt was to follow up 3 months later. PCP listed as Ms. Malfi.    Requested Prescriptions  Pending Prescriptions Disp Refills   acyclovir (ZOVIRAX) 400 MG tablet [Pharmacy Med Name: ACYCLOVIR 400 MG TAB] 30 tablet 2    Sig: TAKE 1 TABLET BY MOUTH DAILY     Antimicrobials:  Antiviral Agents - Anti-Herpetic Passed - 04/17/2021  1:26 PM      Passed - Valid encounter within last 12 months    Recent Outpatient Visits           8 months ago Acute cystitis with hematuria   [place], DO   8 months ago Chronic midline low back pain with right-sided sciatica   [place], DO   10 months ago Acute non-recurrent frontal sinusitis   [place], DO   11 months ago Hypertension, unspecified type   [facility], [place]ita [place], [place]

## 2021-05-02 DIAGNOSIS — G894 Chronic pain syndrome: Secondary | ICD-10-CM | POA: Diagnosis not present

## 2021-05-02 DIAGNOSIS — Z79891 Long term (current) use of opiate analgesic: Secondary | ICD-10-CM | POA: Diagnosis not present

## 2021-05-02 DIAGNOSIS — M5416 Radiculopathy, lumbar region: Secondary | ICD-10-CM | POA: Diagnosis not present

## 2021-05-02 DIAGNOSIS — K5903 Drug induced constipation: Secondary | ICD-10-CM | POA: Diagnosis not present

## 2021-05-02 DIAGNOSIS — M542 Cervicalgia: Secondary | ICD-10-CM | POA: Diagnosis not present

## 2021-05-02 DIAGNOSIS — M545 Low back pain, unspecified: Secondary | ICD-10-CM | POA: Diagnosis not present

## 2021-05-05 DIAGNOSIS — M542 Cervicalgia: Secondary | ICD-10-CM | POA: Diagnosis not present

## 2021-05-05 DIAGNOSIS — G894 Chronic pain syndrome: Secondary | ICD-10-CM | POA: Diagnosis not present

## 2021-05-09 DIAGNOSIS — Z79891 Long term (current) use of opiate analgesic: Secondary | ICD-10-CM | POA: Diagnosis not present

## 2021-05-09 DIAGNOSIS — Z79899 Other long term (current) drug therapy: Secondary | ICD-10-CM | POA: Diagnosis not present

## 2021-05-10 ENCOUNTER — Emergency Department: Payer: Medicare Other

## 2021-05-10 ENCOUNTER — Encounter: Payer: Self-pay | Admitting: Emergency Medicine

## 2021-05-10 ENCOUNTER — Emergency Department
Admission: EM | Admit: 2021-05-10 | Discharge: 2021-05-10 | Disposition: A | Discharge status: Home / Self Care | Payer: Medicare Other | Attending: Emergency Medicine | Admitting: Emergency Medicine

## 2021-05-10 ENCOUNTER — Other Ambulatory Visit: Payer: Self-pay

## 2021-05-10 DIAGNOSIS — I10 Essential (primary) hypertension: Secondary | ICD-10-CM | POA: Diagnosis not present

## 2021-05-10 DIAGNOSIS — J439 Emphysema, unspecified: Secondary | ICD-10-CM | POA: Diagnosis not present

## 2021-05-10 DIAGNOSIS — U071 COVID-19: Secondary | ICD-10-CM | POA: Diagnosis not present

## 2021-05-10 DIAGNOSIS — R531 Weakness: Secondary | ICD-10-CM | POA: Diagnosis not present

## 2021-05-10 DIAGNOSIS — I700 Atherosclerosis of aorta: Secondary | ICD-10-CM | POA: Diagnosis not present

## 2021-05-10 DIAGNOSIS — Z743 Need for continuous supervision: Secondary | ICD-10-CM | POA: Diagnosis not present

## 2021-05-10 DIAGNOSIS — E860 Dehydration: Secondary | ICD-10-CM | POA: Diagnosis not present

## 2021-05-10 DIAGNOSIS — R079 Chest pain, unspecified: Secondary | ICD-10-CM | POA: Diagnosis not present

## 2021-05-10 DIAGNOSIS — E039 Hypothyroidism, unspecified: Secondary | ICD-10-CM | POA: Insufficient documentation

## 2021-05-10 DIAGNOSIS — J9811 Atelectasis: Secondary | ICD-10-CM | POA: Diagnosis not present

## 2021-05-10 DIAGNOSIS — J45909 Unspecified asthma, uncomplicated: Secondary | ICD-10-CM | POA: Diagnosis not present

## 2021-05-10 DIAGNOSIS — R410 Disorientation, unspecified: Secondary | ICD-10-CM | POA: Diagnosis not present

## 2021-05-10 DIAGNOSIS — R5383 Other fatigue: Secondary | ICD-10-CM

## 2021-05-10 DIAGNOSIS — R6884 Jaw pain: Secondary | ICD-10-CM | POA: Insufficient documentation

## 2021-05-10 DIAGNOSIS — K029 Dental caries, unspecified: Secondary | ICD-10-CM | POA: Diagnosis not present

## 2021-05-10 DIAGNOSIS — M47812 Spondylosis without myelopathy or radiculopathy, cervical region: Secondary | ICD-10-CM | POA: Diagnosis not present

## 2021-05-10 DIAGNOSIS — J849 Interstitial pulmonary disease, unspecified: Secondary | ICD-10-CM | POA: Diagnosis not present

## 2021-05-10 DIAGNOSIS — R918 Other nonspecific abnormal finding of lung field: Secondary | ICD-10-CM | POA: Diagnosis not present

## 2021-05-10 LAB — COMPREHENSIVE METABOLIC PANEL WITH GFR
ALT: 19 U/L (ref 0–44)
AST: 28 U/L (ref 15–41)
Albumin: 2.8 g/dL — ABNORMAL LOW (ref 3.5–5.0)
Alkaline Phosphatase: 88 U/L (ref 38–126)
Anion gap: 3 — ABNORMAL LOW (ref 5–15)
BUN: 6 mg/dL — ABNORMAL LOW (ref 8–23)
CO2: 29 mmol/L (ref 22–32)
Calcium: 7.8 mg/dL — ABNORMAL LOW (ref 8.9–10.3)
Chloride: 101 mmol/L (ref 98–111)
Creatinine, Ser: 0.7 mg/dL (ref 0.44–1.00)
GFR, Estimated: >60 mL/min
Glucose, Bld: 97 mg/dL (ref 70–99)
Potassium: 3.7 mmol/L (ref 3.5–5.1)
Sodium: 133 mmol/L — ABNORMAL LOW (ref 135–145)
Total Bilirubin: 0.5 mg/dL (ref 0.3–1.2)
Total Protein: 6 g/dL — ABNORMAL LOW (ref 6.5–8.1)

## 2021-05-10 LAB — CBC WITH DIFFERENTIAL/PLATELET
Abs Immature Granulocytes: 0.01 10*3/uL (ref 0.00–0.07)
Basophils Absolute: 0 10*3/uL (ref 0.0–0.1)
Basophils Relative: 0 %
Eosinophils Absolute: 0 10*3/uL (ref 0.0–0.5)
Eosinophils Relative: 1 %
HCT: 40.2 % (ref 36.0–46.0)
Hemoglobin: 12.7 g/dL (ref 12.0–15.0)
Immature Granulocytes: 0 %
Lymphocytes Relative: 24 %
Lymphs Abs: 1.2 10*3/uL (ref 0.7–4.0)
MCH: 31.5 pg (ref 26.0–34.0)
MCHC: 31.6 g/dL (ref 30.0–36.0)
MCV: 99.8 fL (ref 80.0–100.0)
Monocytes Absolute: 0.7 10*3/uL (ref 0.1–1.0)
Monocytes Relative: 14 %
Neutro Abs: 2.8 10*3/uL (ref 1.7–7.7)
Neutrophils Relative %: 61 %
Platelets: 114 10*3/uL — ABNORMAL LOW (ref 150–400)
RBC: 4.03 MIL/uL (ref 3.87–5.11)
RDW: 14 % (ref 11.5–15.5)
WBC: 4.7 10*3/uL (ref 4.0–10.5)
nRBC: 0 % (ref 0.0–0.2)

## 2021-05-10 LAB — RESP PANEL BY RT-PCR (FLU A&B, COVID) ARPGX2
Influenza A by PCR: NEGATIVE
Influenza B by PCR: NEGATIVE
SARS Coronavirus 2 by RT PCR: POSITIVE — AB

## 2021-05-10 LAB — LACTIC ACID, PLASMA: Lactic Acid, Venous: 1.6 mmol/L (ref 0.5–1.9)

## 2021-05-10 MED ORDER — ONDANSETRON 4 MG PO TBDP: 4.0000 mg | ORAL_TABLET | Freq: Three times a day (TID) | ORAL | 0 refills | Status: AC | PRN

## 2021-05-10 MED ORDER — SODIUM CHLORIDE 0.9 % IV BOLUS (SEPSIS)
1000.0000 mL | Freq: Once | INTRAVENOUS | Status: AC
  Administered 2021-05-10: 1000 mL via INTRAVENOUS

## 2021-05-10 MED ORDER — SODIUM CHLORIDE 0.9 % IV SOLN
3.0000 g | INTRAVENOUS | Status: AC
  Administered 2021-05-10: 3 g via INTRAVENOUS
  Filled 2021-05-10: qty 8

## 2021-05-10 MED ORDER — SODIUM CHLORIDE 0.9 % IV BOLUS: 1000.0000 mL | Freq: Once | INTRAVENOUS | Status: DC

## 2021-05-10 MED ORDER — ONDANSETRON HCL 4 MG/2ML IJ SOLN
4.0000 mg | Freq: Once | INTRAMUSCULAR | Status: AC
  Administered 2021-05-10: 4 mg via INTRAVENOUS
  Filled 2021-05-10: qty 2

## 2021-05-10 MED ORDER — IOHEXOL 300 MG/ML  SOLN
75.0000 mL | Freq: Once | INTRAMUSCULAR | Status: AC | PRN
  Administered 2021-05-10: 75 mL via INTRAVENOUS

## 2021-05-10 MED ORDER — PAXLOVID (300/100) 20 X 150 MG & 10 X 100MG PO TBPK: 3.0000 | ORAL_TABLET | Freq: Two times a day (BID) | ORAL | 0 refills | Status: AC

## 2021-05-10 NOTE — ED Triage Notes (Signed)
Pt comes into the ED via [facility] from home c/o AMS. Pt had 6 teethe pulled last week and was placed on antibiotics.  Pt is able to answer questions appropriately, but is processing the order of things different from baseline.  NIH negative.    80 systolic, after fluids 109 systolic.   81 HR 48 CO2 97% RA 99 oral

## 2021-05-10 NOTE — Discharge Instructions (Addendum)
Your blood tests and CT scans were all okay.  Your COVID test is positive.  Take Paxlovid as prescribed to help control your symptoms and speed up your recovery.  Drink plenty of fluids to stay hydrated.

## 2021-05-10 NOTE — ED Provider Notes (Signed)
[facility] Provider Note    Event Date/Time   First MD Initiated Contact with Patient 05/10/21 1231     (approximate)   History   Altered Mental Status   HPI  April Soto is a 61 y.o. female with a history of asthma, hypothyroidism, depression, hypertension who is brought to the ED due to altered mental status.  Reportedly she had 6 teeth pulled a week ago.  Patient states that she has had persistent jaw pain since then which is not escalating.  She does have some difficulty with swallowing she reports.  No chest pain or shortness of breath.  No cough or fever.  She does report feeling fatigued and low energy.  No other acute complaints.  EMS report initial blood pressure was about 80 systolic, which normalized after getting 500 mL of IV fluids.  Other vitals unremarkable     Physical Exam   Triage Vital Signs: ED Triage Vitals  Enc Vitals Group     BP 05/10/21 1229 (!) 89/45     Pulse Rate 05/10/21 1229 81     Resp 05/10/21 1229 17     Temp 05/10/21 1229 98.8 F (37.1 C)     Temp Source 05/10/21 1229 Oral     SpO2 05/10/21 1229 96 %     Weight 05/10/21 1226 197 lb 1.5 oz (89.4 kg)     Height 05/10/21 1226 5\' 7"  (1.702 m)     Head Circumference --      Peak Flow --      Pain Score 05/10/21 1226 4     Pain Loc --      Pain Edu? --      Excl. in GC? --     Most recent vital signs: Vitals:   05/10/21 1315 05/10/21 1515  BP: 105/66 110/69  Pulse: 70 64  Resp: 14 13  Temp: 98.2 F (36.8 C) 98 F (36.7 C)  SpO2: 93% 91%     General: Awake, no distress.  Oriented x3 CV:  Good peripheral perfusion.  Symmetric pulses.  Regular rate Resp:  Normal effort.  Unlabored breathing.  Clear to auscultation bilaterally Abd:  No distention.  Soft and nontender Other:  No peripheral edema or soft tissue inflammatory changes.  No jaw or gingival swelling on exam, floor mouth is soft, no tongue elevation.  There is poor dentition with multiple  decayed stumps of teeth.   ED Results / Procedures / Treatments   Labs (all labs ordered are listed, but only abnormal results are displayed) Labs Reviewed  RESP PANEL BY RT-PCR (FLU A&B, COVID) ARPGX2 - Abnormal; Notable for the following components:      Result Value   SARS Coronavirus 2 by RT PCR POSITIVE (*)    All other components within normal limits  COMPREHENSIVE METABOLIC PANEL - Abnormal; Notable for the following components:   Sodium 133 (*)    BUN 6 (*)    Calcium 7.8 (*)    Total Protein 6.0 (*)    Albumin 2.8 (*)    Anion gap 3 (*)    All other components within normal limits  CBC WITH DIFFERENTIAL/PLATELET - Abnormal; Notable for the following components:   Platelets 114 (*)    All other components within normal limits  CULTURE, BLOOD (ROUTINE X 2)  CULTURE, BLOOD (ROUTINE X 2)  URINE CULTURE  LACTIC ACID, PLASMA  URINALYSIS, COMPLETE (UACMP) WITH MICROSCOPIC     EKG  RADIOLOGY Chest x-ray viewed and interpreted by me, concerning for right basilar infiltrate.  Radiology report reviewed.  CT neck and chest unremarkable   PROCEDURES:  Critical Care performed: No  Procedures   MEDICATIONS ORDERED IN ED: Medications  ondansetron (ZOFRAN) injection 4 mg (4 mg Intravenous Given 05/10/21 1241)  sodium chloride 0.9 % bolus 1,000 mL (0 mLs Intravenous Stopped 05/10/21 1334)    And  sodium chloride 0.9 % bolus 1,000 mL (0 mLs Intravenous Stopped 05/10/21 1334)    And  sodium chloride 0.9 % bolus 1,000 mL (0 mLs Intravenous Stopped 05/10/21 1452)  Ampicillin-Sulbactam (UNASYN) 3 g in sodium chloride 0.9 % 100 mL IVPB (0 g Intravenous Stopped 05/10/21 1334)  iohexol (OMNIPAQUE) 300 MG/ML solution 75 mL (75 mLs Intravenous Contrast Given 05/10/21 1344)     IMPRESSION / MDM / ASSESSMENT AND PLAN / ED COURSE  I reviewed the triage vital signs and the nursing notes.                              Differential diagnosis includes, but is not limited to, jaw  abscess, pneumonia, dehydration, electrolyte abnormalities     Patient presents with persistent jaw pain symptoms a week after multiple dental extractions.  She is fatigued but appears oriented.  No respiratory distress, maintaining airway without difficulty.  Chest x-ray is indeterminate.  Will obtain CT neck and chest to further evaluate for infectious complications.  Lab panel is unremarkable.  ----------------------------------------- 3:21 PM on 05/10/2021 ----------------------------------------- CT scans unremarkable.  COVID test is positive.  We will send Paxlovid and Zofran and p.o. trial.      FINAL CLINICAL IMPRESSION(S) / ED DIAGNOSES   Final diagnoses:  COVID-19 virus infection  Dehydration  Fatigue, unspecified type     Rx / DC Orders   ED Discharge Orders          Ordered    ondansetron (ZOFRAN-ODT) 4 MG disintegrating tablet  Every 8 hours PRN        05/10/21 1520    nirmatrelvir & ritonavir (PAXLOVID, 300/100,) 20 x 150 MG & 10 x 100MG  TBPK  2 times daily        05/10/21 1520             Note:  This document was prepared using Dragon voice recognition software and may include unintentional dictation errors.   05/12/21, MD 05/10/21 [phone]

## 2021-05-11 LAB — BLOOD CULTURE ID PANEL (REFLEXED) - BCID2

## 2021-05-14 LAB — CULTURE, BLOOD (ROUTINE X 2): Special Requests: ADEQUATE

## 2021-05-15 LAB — CULTURE, BLOOD (ROUTINE X 2): Special Requests: ADEQUATE

## 2021-05-21 ENCOUNTER — Telehealth: Payer: Self-pay | Admitting: Internal Medicine

## 2021-05-21 DIAGNOSIS — I10 Essential (primary) hypertension: Secondary | ICD-10-CM

## 2021-05-22 NOTE — Telephone Encounter (Signed)
Filled for 90 day supply 04/17/21, 34 days ago. Requested Prescriptions  Pending Prescriptions Disp Refills   metoprolol succinate (TOPROL-XL) 50 MG 24 hr tablet [Pharmacy Med Name: METOPROLOL SUCCINATE ER 50 MG TAB] 90 tablet 1    Sig: TAKE 1 TABLET BY MOUTH AT BEDTIME     Cardiovascular:  Beta Blockers Failed - 05/21/2021  5:09 PM      Failed - Valid encounter within last 6 months    Recent Outpatient Visits          9 months ago Acute cystitis with hematuria   [place], DO   9 months ago Chronic midline low back pain with right-sided sciatica   [facility] Olin Hauser, DO   11 months ago Acute non-recurrent frontal sinusitis   [facility] [place], Devonne Doughty, DO   1 year ago Hypertension, unspecified type   [facility], Lupita Raider, [place]             Passed - Last BP in normal range    BP Readings from Last 1 Encounters:  05/10/21 112/71         Passed - Last Heart Rate in normal range    Pulse Readings from Last 1 Encounters:  05/10/21 66

## 2021-05-22 NOTE — Telephone Encounter (Signed)
Refill has been denied because I have never seen her.  She needs to set up an appointment for follow-up of chronic conditions with me.  If she does this we can send in her metoprolol

## 2021-05-22 NOTE — Telephone Encounter (Signed)
April Soto called from the pharmacy to report that they never received the latest refill Rx in January. Stating they need PCP to resubmit Rx. Please advise

## 2021-05-22 NOTE — Telephone Encounter (Signed)
Tried calling pt's number is not is service.  PEC if pt calls back about this prescription please advise pt that she will need to schedule an office visit before she has any refills.   Thanks,   -Mickel Baas

## 2021-05-23 ENCOUNTER — Other Ambulatory Visit: Payer: Self-pay | Admitting: Internal Medicine

## 2021-05-23 DIAGNOSIS — I10 Essential (primary) hypertension: Secondary | ICD-10-CM

## 2021-05-23 NOTE — Telephone Encounter (Signed)
Requested Prescriptions  Pending Prescriptions Disp Refills   metoprolol succinate (TOPROL-XL) 50 MG 24 hr tablet [Pharmacy Med Name: METOPROLOL SUCCINATE ER 50 MG TAB] 30 tablet 0    Sig: TAKE 1 TABLET BY MOUTH AT BEDTIME     Cardiovascular:  Beta Blockers Failed - 05/23/2021 10:06 AM      Failed - Valid encounter within last 6 months    Recent Outpatient Visits          9 months ago Acute cystitis with hematuria   [place], DO   9 months ago Chronic midline low back pain with right-sided sciatica   [facility] Olin Hauser, DO   11 months ago Acute non-recurrent frontal sinusitis   [facility] [place], Devonne Doughty, DO   1 year ago Hypertension, unspecified type   [facility], Lupita Raider, [place]             Passed - Last BP in normal range    BP Readings from Last 1 Encounters:  05/10/21 112/71         Passed - Last Heart Rate in normal range    Pulse Readings from Last 1 Encounters:  05/10/21 66

## 2021-05-23 NOTE — Telephone Encounter (Signed)
Pt called, got message for "call cannot be completed at this time". Pt is due for an appt for medication refills.

## 2021-05-30 DIAGNOSIS — M542 Cervicalgia: Secondary | ICD-10-CM | POA: Diagnosis not present

## 2021-05-30 DIAGNOSIS — G894 Chronic pain syndrome: Secondary | ICD-10-CM | POA: Diagnosis not present

## 2021-05-30 DIAGNOSIS — K5903 Drug induced constipation: Secondary | ICD-10-CM | POA: Diagnosis not present

## 2021-05-30 DIAGNOSIS — M5416 Radiculopathy, lumbar region: Secondary | ICD-10-CM | POA: Diagnosis not present

## 2021-05-30 DIAGNOSIS — Z79891 Long term (current) use of opiate analgesic: Secondary | ICD-10-CM | POA: Diagnosis not present

## 2021-05-30 DIAGNOSIS — F17210 Nicotine dependence, cigarettes, uncomplicated: Secondary | ICD-10-CM | POA: Diagnosis not present

## 2021-05-30 DIAGNOSIS — M545 Low back pain, unspecified: Secondary | ICD-10-CM | POA: Diagnosis not present

## 2021-06-05 DIAGNOSIS — G894 Chronic pain syndrome: Secondary | ICD-10-CM | POA: Diagnosis not present

## 2021-06-05 DIAGNOSIS — M542 Cervicalgia: Secondary | ICD-10-CM | POA: Diagnosis not present

## 2021-06-11 ENCOUNTER — Other Ambulatory Visit: Payer: Self-pay | Admitting: Internal Medicine

## 2021-06-11 DIAGNOSIS — E785 Hyperlipidemia, unspecified: Secondary | ICD-10-CM

## 2021-06-12 NOTE — Telephone Encounter (Signed)
Requested Prescriptions  ?Pending Prescriptions Disp Refills  ?? rosuvastatin (CRESTOR) 40 MG tablet [Pharmacy Med Name: ROSUVASTATIN CALCIUM 40 MG TAB] 90 tablet 0  ?  Sig: TAKE 1 TABLET BY MOUTH DAILY  ?  ? Cardiovascular:  Antilipid - Statins 2 Failed - 06/11/2021  4:07 PM  ?  ?  Failed - Lipid Panel in normal range within the last 12 months  ?  Cholesterol  ?Date Value Ref Range Status  ?03/15/2021 136 <200 mg/dL Final  ? ?LDL Cholesterol (Calc)  ?Date Value Ref Range Status  ?03/15/2021 69 mg/dL (calc) Final  ?  Comment:  ?  Reference range: <100 ?Marland Kitchen ?Desirable range <100 mg/dL for primary prevention;   ?<70 mg/dL for patients with CHD or diabetic patients  ?with > or = 2 CHD risk factors. ?. ?LDL-C is now calculated using the Martin-Hopkins  ?calculation, which is a validated novel method providing  ?better accuracy than the Friedewald equation in the  ?estimation of LDL-C.  ?Cresenciano Genre et al. Annamaria Helling. [id number]): 2061-2068  ?(http://education.[employer].com/faq/FAQ164) ?  ? ?HDL  ?Date Value Ref Range Status  ?03/15/2021 44 (L) > OR = 50 mg/dL Final  ? ?Triglycerides  ?Date Value Ref Range Status  ?03/15/2021 155 (H) <150 mg/dL Final  ? ?  ?  ?  Passed - Cr in normal range and within 360 days  ?  Creat  ?Date Value Ref Range Status  ?08/08/2020 0.54 0.50 - 0.99 mg/dL Final  ?  Comment:  ?  For patients >52 years of age, the reference limit ?for Creatinine is approximately 13% higher for people ?identified as African-American. ?. ?  ? ?Creatinine, Ser  ?Date Value Ref Range Status  ?05/10/2021 0.70 0.44 - 1.00 mg/dL Final  ?   ?  ?  Passed - Patient is not pregnant  ?  ?  Passed - Valid encounter within last 12 months  ?  Recent Outpatient Visits   ?      ? 10 months ago Acute cystitis with hematuria  ? [place], DO  ? 10 months ago Chronic midline low back pain with right-sided sciatica  ? [place], DO  ? 12 months ago  Acute non-recurrent frontal sinusitis  ? [place], DO  ? 1 year ago Hypertension, unspecified type  ? [facility], Lupita Raider, FNP  ?  ?  ?Future Appointments   ?        ? Tomorrow Jearld Fenton, NP [facility], [place]  ?  ? ?  ?  ?  ? ?

## 2021-06-13 ENCOUNTER — Other Ambulatory Visit: Payer: Self-pay | Admitting: Family Medicine

## 2021-06-13 ENCOUNTER — Ambulatory Visit (INDEPENDENT_AMBULATORY_CARE_PROVIDER_SITE_OTHER): Payer: Medicare Other | Admitting: Internal Medicine

## 2021-06-13 ENCOUNTER — Other Ambulatory Visit: Payer: Self-pay

## 2021-06-13 ENCOUNTER — Encounter: Payer: Self-pay | Admitting: Internal Medicine

## 2021-06-13 ENCOUNTER — Other Ambulatory Visit: Payer: Self-pay | Admitting: Internal Medicine

## 2021-06-13 VITALS — BP 126/77 | HR 79 | Temp 96.9°F | Ht 66.0 in | Wt 184.0 lb

## 2021-06-13 DIAGNOSIS — R7309 Other abnormal glucose: Secondary | ICD-10-CM | POA: Diagnosis not present

## 2021-06-13 DIAGNOSIS — E782 Mixed hyperlipidemia: Secondary | ICD-10-CM | POA: Diagnosis not present

## 2021-06-13 DIAGNOSIS — J4540 Moderate persistent asthma, uncomplicated: Secondary | ICD-10-CM | POA: Diagnosis not present

## 2021-06-13 DIAGNOSIS — E039 Hypothyroidism, unspecified: Secondary | ICD-10-CM

## 2021-06-13 DIAGNOSIS — M5441 Lumbago with sciatica, right side: Secondary | ICD-10-CM | POA: Diagnosis not present

## 2021-06-13 DIAGNOSIS — E7800 Pure hypercholesterolemia, unspecified: Secondary | ICD-10-CM

## 2021-06-13 DIAGNOSIS — Z6829 Body mass index (BMI) 29.0-29.9, adult: Secondary | ICD-10-CM

## 2021-06-13 DIAGNOSIS — G8929 Other chronic pain: Secondary | ICD-10-CM

## 2021-06-13 DIAGNOSIS — I10 Essential (primary) hypertension: Secondary | ICD-10-CM

## 2021-06-13 DIAGNOSIS — I700 Atherosclerosis of aorta: Secondary | ICD-10-CM

## 2021-06-13 DIAGNOSIS — E785 Hyperlipidemia, unspecified: Secondary | ICD-10-CM | POA: Insufficient documentation

## 2021-06-13 DIAGNOSIS — K5909 Other constipation: Secondary | ICD-10-CM

## 2021-06-13 DIAGNOSIS — F332 Major depressive disorder, recurrent severe without psychotic features: Secondary | ICD-10-CM

## 2021-06-13 DIAGNOSIS — A6000 Herpesviral infection of urogenital system, unspecified: Secondary | ICD-10-CM | POA: Diagnosis not present

## 2021-06-13 DIAGNOSIS — K219 Gastro-esophageal reflux disease without esophagitis: Secondary | ICD-10-CM

## 2021-06-13 DIAGNOSIS — A6004 Herpesviral vulvovaginitis: Secondary | ICD-10-CM

## 2021-06-13 DIAGNOSIS — E663 Overweight: Secondary | ICD-10-CM | POA: Insufficient documentation

## 2021-06-13 MED ORDER — ESOMEPRAZOLE MAGNESIUM 40 MG PO CPDR: 40.0000 mg | DELAYED_RELEASE_CAPSULE | Freq: Every day | ORAL | 1 refills | Status: DC

## 2021-06-13 MED ORDER — DICLOFENAC SODIUM 75 MG PO TBEC: 75.0000 mg | DELAYED_RELEASE_TABLET | Freq: Two times a day (BID) | ORAL | 1 refills | Status: DC

## 2021-06-13 MED ORDER — ACYCLOVIR 400 MG PO TABS: 400.0000 mg | ORAL_TABLET | Freq: Every day | ORAL | 1 refills | Status: DC

## 2021-06-13 MED ORDER — SPIRIVA HANDIHALER 18 MCG IN CAPS: 1.0000 | ORAL_CAPSULE | Freq: Every day | RESPIRATORY_TRACT | 1 refills | Status: DC

## 2021-06-13 MED ORDER — OMEGA-3-ACID ETHYL ESTERS 1 G PO CAPS: 2.0000 | ORAL_CAPSULE | Freq: Two times a day (BID) | ORAL | 1 refills | Status: DC

## 2021-06-13 NOTE — Telephone Encounter (Signed)
Requested medication (s) are due for refill today: yes ? ?Requested medication (s) are on the active medication list: yes   ? ?Last refill: 03/14/21 #90 0 refills ? ?Future visit scheduled Yes 12/17/21 ? ?Notes to clinic: Pt had appt today and labs drawn. Dose pending  on results per PCP note; please review. Thank you ? ?Requested Prescriptions  ?Pending Prescriptions Disp Refills  ? SYNTHROID 100 MCG tablet [Pharmacy Med Name: SYNTHROID 100 MCG TAB] 90 tablet 0  ?  Sig: TAKE 1 TABLET BY MOUTH DAILY BEFORE BREAKFAST  ?  ? Endocrinology:  Hypothyroid Agents Failed - 06/13/2021  4:12 PM  ?  ?  Failed - TSH in normal range and within 360 days  ?  TSH  ?Date Value Ref Range Status  ?08/08/2020 0.08 (L) 0.40 - 4.50 mIU/L Final  ?  ?  ?  ?  Passed - Valid encounter within last 12 months  ?  Recent Outpatient Visits   ? ?      ? Today Acquired hypothyroidism  ? [facility] [place], [place], NP  ? 10 months ago Acute cystitis with hematuria  ? [place], DO  ? 10 months ago Chronic midline low back pain with right-sided sciatica  ? [place], DO  ? 1 year ago Acute non-recurrent frontal sinusitis  ? [place], DO  ? 1 year ago Hypertension, unspecified type  ? [facility], Lupita Raider, FNP  ? ?  ?  ?Future Appointments   ? ?        ? In 6 months Baity, Coralie Keens, NP [facility], [place]  ? ?  ? ?  ?  ?  ? ? ? ? ?

## 2021-06-13 NOTE — Assessment & Plan Note (Signed)
Encouraged high-fiber diet and adequate water intake ?Continue Movantik ?

## 2021-06-13 NOTE — Assessment & Plan Note (Signed)
Deteriorated ?We will restart Spiriva in addition to Symbicort and Albuterol ?Encourage smoking cessation ?

## 2021-06-13 NOTE — Assessment & Plan Note (Signed)
C-Met and lipid profile today ?Continue Rosuvastatin and Fish Oil ?We will have her start baby aspirin ?

## 2021-06-13 NOTE — Progress Notes (Signed)
Subjective:    Patient ID: April Soto, female    DOB: 1960/05/30, 61 y.o.   MRN: [id number]  HPI  Pt presents to the clinic today for follow up of chronic conditions. She is establishing care with me today, transferring care from Danielle Rankin, NP.  HTN: Her BP today is 126/77.  She is taking Metoprolol as prescribed.  ECG from 04/2021.  Reviewed.  Asthma, Moderate Persistent: Managed on Symbicort and Albuterol. She does feel like her breathing has gotten worse and would like to get started back on Spiriva. There are no PFTs on file. She does smoke.  She does not follow with pulmonology  Hypothyroidism: She denies any issues on her current dose of Levothyroxine.  She does not follow with endocrinology.  Chronic Low Back Pain: Managed with MS Contin, Oxycodone, Cyclobenzaprine and Pregabalin. She would like to restart Diclofenac. She does follow with pain management.  GERD: She is not sure what triggers this. She Esomeprazole as needed. There is no upper GI on file.  Chronic Constipation: Managed with MovantiK. She does not follow with GI.  HLD: Her last LDL was 69, triglycerides 381, 03/2021. She is taking Rosuvastatin as prescribed. She does not consume a low fat diet.  Depression: Chronic, managed on Venlafaxine, Hydroxyzine and Geodon as prescribed. She is currently seeing a therapist  and a psychiatrist.  Genital Herpes: She reports recent outbreak on Valacyclovir.  Review of Systems   Past Medical History:  Diagnosis Date   Anemia    "years ago"   Anxiety    takes Ativan daily   Asthma    Bipolar 1 disorder (HCC)    Chronic back pain    HNP   Constipation    takes Ra Col Rite daily   Degenerative disc disease    Depression    was on Prozac but taken off by MD for a couple of weeks   Depression    Fibromyalgia    takes Lyrica daily   Genital herpes    GERD (gastroesophageal reflux disease)    takes Nexium daily   Headache(784.0)    Heart murmur    "grew  out of"   History of bronchitis    last time 15+yrs ago   History of rheumatic fever    Hyperlipidemia    takes Simvastatin daily   Hypertension    takes Coreg daily   Hypothyroidism    takes Synthroid daily   Joint pain    Muscle spasm    takes Flexeril daily as needed   Nocturia    Osteoarthritis    Peripheral edema    takes HCTZ daily   Pneumonia    hx of;last time 30+yrs ago   Raynaud's disease    Rheumatic fever    as a child   Seasonal allergies    takes Zyrtec daily and uses Dymista as well   Stroke (HCC)    ? 5 years ago. No lasting deficits   Urinary incontinence    Weakness    nmbness and tingling-right leg    Current Outpatient Medications  Medication Sig Dispense Refill   acyclovir (ZOVIRAX) 400 MG tablet TAKE 1 TABLET BY MOUTH DAILY 30 tablet 2   albuterol (VENTOLIN HFA) 108 (90 Base) MCG/ACT inhaler Inhale 2 puffs into the lungs every 4 (four) hours as needed for wheezing or shortness of breath.      cyclobenzaprine (FLEXERIL) 10 MG tablet Take 10 mg by mouth 3 (three) times daily.  diclofenac (VOLTAREN) 75 MG EC tablet Take 75 mg by mouth daily as needed for mild pain or moderate pain.      esomeprazole (NEXIUM) 40 MG capsule TAKE 1 CAPSULE BY MOUTH ONCE DAILY WITH LARGE GLASS OF WATER 30 capsule 2   fluticasone (FLONASE) 50 MCG/ACT nasal spray Place 1 spray into both nostrils at bedtime as needed for allergies.      hydrocortisone (ANUSOL-HC) 2.5 % rectal cream Apply 1 application topically as needed for hemorrhoids.      hydrOXYzine (ATARAX/VISTARIL) 10 MG tablet Take 10 mg by mouth 2 (two) times daily.     icosapent Ethyl (VASCEPA) 1 g capsule Vascepa 1 gram capsule     lubiprostone (AMITIZA) 24 MCG capsule      metoprolol succinate (TOPROL-XL) 50 MG 24 hr tablet TAKE 1 TABLET BY MOUTH AT BEDTIME 30 tablet 0   morphine (MS CONTIN) 15 MG 12 hr tablet Take 15 mg by mouth 3 (three) times daily.     MOVANTIK 25 MG TABS tablet Take 25 mg by mouth daily.      Olopatadine HCl 0.2 % SOLN Place 1 drop into both eyes at bedtime as needed (allergies).     omega-3 acid ethyl esters (LOVAZA) 1 g capsule TAKE TWO CAPSULES BY MOUTH TWICE A DAY 60 capsule 0   ondansetron (ZOFRAN-ODT) 4 MG disintegrating tablet Take 1 tablet (4 mg total) by mouth every 8 (eight) hours as needed for nausea or vomiting. 20 tablet 0   pregabalin (LYRICA) 150 MG capsule Take 150 mg by mouth 3 (three) times daily.     rosuvastatin (CRESTOR) 40 MG tablet TAKE 1 TABLET BY MOUTH DAILY 90 tablet 0   SPIRIVA HANDIHALER 18 MCG inhalation capsule 1 capsule daily.     SYMBICORT 160-4.5 MCG/ACT inhaler INHALE 2 PUFFS BY MOUTH TWICE DAILY (MAINTENANCE INHALER). RINSE MOUTH WELL AFTER USE TO PREVENT THRUSH. 10.2 g 0   SYNTHROID 100 MCG tablet TAKE 1 TABLET BY MOUTH DAILY BEFORE BREAKFAST 90 tablet 0   venlafaxine XR (EFFEXOR-XR) 150 MG 24 hr capsule Take 1 capsule (150 mg total) by mouth every evening. For mood control 30 capsule 0   ziprasidone (GEODON) 40 MG capsule TAKE 1 CAPSULE BY MOUTH EVERY EVENING WITH FOOD FOR MOOD 90 capsule 0   No current facility-administered medications for this visit.    No Known Allergies  Family History  Problem Relation Age of Onset   Diabetes type II Mother    Hypertension Mother    Diabetes Mother    Diabetes Sister    Hypertension Sister    Hypertension Maternal Grandmother    Diabetes Maternal Grandmother    Breast cancer Neg Hx     Social History   Socioeconomic History   Marital status: Divorced    Spouse name: Not on file   Number of children: Not on file   Years of education: Not on file   Highest education level: Not on file  Occupational History   Not on file  Tobacco Use   Smoking status: Some Days    Packs/day: 1.00    Years: 40.00    Pack years: 40.00    Types: Cigarettes   Smokeless tobacco: Never  Vaping Use   Vaping Use: Never used  Substance and Sexual Activity   Alcohol use: No   Drug use: No   Sexual activity:  Not Currently    Birth control/protection: Surgical  Other Topics Concern   Not on file  Social  History Narrative   Not on file   Social Determinants of Health   Financial Resource Strain: Not on file  Food Insecurity: Not on file  Transportation Needs: Not on file  Physical Activity: Not on file  Stress: Not on file  Social Connections: Not on file  Intimate Partner Violence: Not on file     Constitutional: Denies fever, malaise, fatigue, headache or abrupt weight changes.  HEENT: Denies eye pain, eye redness, ear pain, ringing in the ears, wax buildup, runny nose, nasal congestion, bloody nose, or sore throat. Respiratory: Patient reports shortness of breath.  Denies difficulty breathing, cough or sputum production.   Cardiovascular: Denies chest pain, chest tightness, palpitations or swelling in the hands or feet.  Gastrointestinal: Pt reports constipation. Denies abdominal pain, bloating, diarrhea or blood in the stool.  GU: Denies urgency, frequency, pain with urination, burning sensation, blood in urine, odor or discharge. Musculoskeletal: Pt reports chronic low back pain. Denies decrease in range of motion, difficulty with gait, muscle pain or joint swelling.  Skin: Denies redness, rashes, lesions or ulcercations.  Neurological: Denies dizziness, difficulty with memory, difficulty with speech or problems with balance and coordination.  Psych: Pt has a history of depression. Denies anxiety, SI/HI.  No other specific complaints in a complete review of systems (except as listed in HPI above).     Objective:   Physical Exam  BP 126/77 (BP Location: Right Arm, Patient Position: Sitting, Cuff Size: Large)    Pulse 79    Temp (!) 96.9 F (36.1 C) (Temporal)    Ht 5\' 6"  (1.676 m)    Wt 184 lb (83.5 kg)    SpO2 99%    BMI 29.70 kg/m   Wt Readings from Last 3 Encounters:  05/10/21 197 lb 1.5 oz (89.4 kg)  08/08/20 197 lb (89.4 kg)  08/01/20 197 lb 3.2 oz (89.4 kg)     General: Appears her stated age, chronically ill-appearing, in NAD. Skin: Warm, dry and intact.  HEENT: Head: normal shape and size; Eyes: EOMs intact;  Neck:  Neck supple, trachea midline. No masses, lumps or thyromegaly present.  Cardiovascular: Normal rate and rhythm. S1,S2 noted.  No murmur, rubs or gallops noted. No JVD or BLE edema. No carotid bruits noted. Pulmonary/Chest: Normal effort and positive vesicular breath sounds. No respiratory distress. No wheezes, rales or ronchi noted.  Abdomen: Soft and nontender. Normal bowel sounds.  Musculoskeletal: Gait slow and steady with use of walker. Neurological: Alert and oriented.  Psychiatric: Mood and affect flat. Behavior is normal. Judgment and thought content normal.    BMET    Component Value Date/Time   NA 133 (L) 05/10/2021 1229   NA 140 03/30/2014 2045   K 3.7 05/10/2021 1229   K 3.7 03/30/2014 2045   CL 101 05/10/2021 1229   CL 106 03/30/2014 2045   CO2 29 05/10/2021 1229   CO2 28 03/30/2014 2045   GLUCOSE 97 05/10/2021 1229   GLUCOSE 96 03/30/2014 2045   BUN 6 (L) 05/10/2021 1229   BUN 7 03/30/2014 2045   CREATININE 0.70 05/10/2021 1229   CREATININE 0.54 08/08/2020 1124   CALCIUM 7.8 (L) 05/10/2021 1229   CALCIUM 8.7 03/30/2014 2045   GFRNONAA >60 05/10/2021 1229   GFRNONAA 102 08/08/2020 1124   GFRAA 118 08/08/2020 1124    Lipid Panel     Component Value Date/Time   CHOL 136 03/15/2021 1403   TRIG 155 (H) 03/15/2021 1403   HDL 44 (L) 03/15/2021 1403  CHOLHDL 3.1 03/15/2021 1403   LDLCALC 69 03/15/2021 1403    CBC    Component Value Date/Time   WBC 4.7 05/10/2021 1229   RBC 4.03 05/10/2021 1229   HGB 12.7 05/10/2021 1229   HGB 15.5 03/30/2014 2045   HCT 40.2 05/10/2021 1229   HCT 47.0 03/30/2014 2045   PLT 114 (L) 05/10/2021 1229   PLT 174 03/30/2014 2045   MCV 99.8 05/10/2021 1229   MCV 92 03/30/2014 2045   MCH 31.5 05/10/2021 1229   MCHC 31.6 05/10/2021 1229   RDW 14.0 05/10/2021 1229    RDW 15.4 (H) 03/30/2014 2045   LYMPHSABS 1.2 05/10/2021 1229   MONOABS 0.7 05/10/2021 1229   EOSABS 0.0 05/10/2021 1229   BASOSABS 0.0 05/10/2021 1229    Hgb A1C Lab Results  Component Value Date   HGBA1C 5.1 08/08/2020           Assessment & Plan:    Nicki Reaperegina Matteus Mcnelly, NP This visit occurred during the SARS-CoV-2 public health emergency.  Safety protocols were in place, including screening questions prior to the visit, additional usage of staff PPE, and extensive cleaning of exam room while observing appropriate contact time as indicated for disinfecting solutions. ]

## 2021-06-13 NOTE — Assessment & Plan Note (Signed)
TSH and free T4 today We will adjust Levothyroxine if needed based on labs 

## 2021-06-13 NOTE — Assessment & Plan Note (Signed)
Continue Valacyclovir 

## 2021-06-13 NOTE — Assessment & Plan Note (Signed)
Stable on Venlafaxine, Hydroxyzine Geodon ?She will continue to see therapy and psychiatry ?

## 2021-06-13 NOTE — Assessment & Plan Note (Signed)
Controlled on Metoprolol ?Reinforced DASH diet and exercise for weight loss ?C-Met today ?

## 2021-06-13 NOTE — Assessment & Plan Note (Signed)
Encourage weight loss as this can help reduce reflux symptoms ?Continue Esomeprazole as needed ?

## 2021-06-13 NOTE — Patient Instructions (Signed)
Heart-Healthy Eating Plan Many factors influence your heart (coronary) health, including eating and exercise habits. Coronary risk increases with abnormal blood fat (lipid) levels. Heart-healthy meal planning includes limiting unhealthy fats, increasing healthy fats, and making other diet and lifestyle changes. What is my plan? Your health care provider may recommend that you: Limit your fat intake to _________% or less of your total calories each day. Limit your saturated fat intake to _________% or less of your total calories each day. Limit the amount of cholesterol in your diet to less than _________ mg per day. What are tips for following this plan? Cooking Cook foods using methods other than frying. Baking, boiling, grilling, and broiling are all good options. Other ways to reduce fat include: Removing the skin from poultry. Removing all visible fats from meats. Steaming vegetables in water or broth. Meal planning  At meals, imagine dividing your plate into fourths: Fill one-half of your plate with vegetables and green salads. Fill one-fourth of your plate with whole grains. Fill one-fourth of your plate with lean protein foods. Eat 4-5 servings of vegetables per day. One serving equals 1 cup raw or cooked vegetable, or 2 cups raw leafy greens. Eat 4-5 servings of fruit per day. One serving equals 1 medium whole fruit,  cup dried fruit,  cup fresh, frozen, or canned fruit, or  cup 100% fruit juice. Eat more foods that contain soluble fiber. Examples include apples, broccoli, carrots, beans, peas, and barley. Aim to get 25-30 g of fiber per day. Increase your consumption of legumes, nuts, and seeds to 4-5 servings per week. One serving of dried beans or legumes equals  cup cooked, 1 serving of nuts is  cup, and 1 serving of seeds equals 1 tablespoon. Fats Choose healthy fats more often. Choose monounsaturated and polyunsaturated fats, such as olive and canola oils, flaxseeds,  walnuts, almonds, and seeds. Eat more omega-3 fats. Choose salmon, mackerel, sardines, tuna, flaxseed oil, and ground flaxseeds. Aim to eat fish at least 2 times each week. Check food labels carefully to identify foods with trans fats or high amounts of saturated fat. Limit saturated fats. These are found in animal products, such as meats, butter, and cream. Plant sources of saturated fats include palm oil, palm kernel oil, and coconut oil. Avoid foods with partially hydrogenated oils in them. These contain trans fats. Examples are stick margarine, some tub margarines, cookies, crackers, and other baked goods. Avoid fried foods. General information Eat more home-cooked food and less restaurant, buffet, and fast food. Limit or avoid alcohol. Limit foods that are high in starch and sugar. Lose weight if you are overweight. Losing just 5-10% of your body weight can help your overall health and prevent diseases such as diabetes and heart disease. Monitor your salt (sodium) intake, especially if you have high blood pressure. Talk with your health care provider about your sodium intake. Try to incorporate more vegetarian meals weekly. What foods can I eat? Fruits All fresh, canned (in natural juice), or frozen fruits. Vegetables Fresh or frozen vegetables (raw, steamed, roasted, or grilled). Green salads. Grains Most grains. Choose whole wheat and whole grains most of the time. Rice and pasta, including brown rice and pastas made with whole wheat. Meats and other proteins Lean, well-trimmed beef, veal, pork, and lamb. Chicken and turkey without skin. All fish and shellfish. Wild duck, rabbit, pheasant, and venison. Egg whites or low-cholesterol egg substitutes. Dried beans, peas, lentils, and tofu. Seeds and most nuts. Dairy Low-fat or nonfat cheeses,   including ricotta and mozzarella. Skim or 1% milk (liquid, powdered, or evaporated). Buttermilk made with low-fat milk. Nonfat or low-fat  yogurt. Fats and oils Non-hydrogenated (trans-free) margarines. Vegetable oils, including soybean, sesame, sunflower, olive, peanut, safflower, corn, canola, and cottonseed. Salad dressings or mayonnaise made with a vegetable oil. Beverages Water (mineral or sparkling). Coffee and tea. Diet carbonated beverages. Sweets and desserts Sherbet, gelatin, and fruit ice. Small amounts of dark chocolate. Limit all sweets and desserts. Seasonings and condiments All seasonings and condiments. The items listed above may not be a complete list of foods and beverages you can eat. Contact a dietitian for more options. What foods are not recommended? Fruits Canned fruit in heavy syrup. Fruit in cream or butter sauce. Fried fruit. Limit coconut. Vegetables Vegetables cooked in cheese, cream, or butter sauce. Fried vegetables. Grains Breads made with saturated or trans fats, oils, or whole milk. Croissants. Sweet rolls. Donuts. High-fat crackers, such as cheese crackers. Meats and other proteins Fatty meats, such as hot dogs, ribs, sausage, bacon, rib-eye roast or steak. High-fat deli meats, such as salami and bologna. Caviar. Domestic duck and goose. Organ meats, such as liver. Dairy Cream, sour cream, cream cheese, and creamed cottage cheese. Whole-milk cheeses. Whole or 2% milk (liquid, evaporated, or condensed). Whole buttermilk. Cream sauce or high-fat cheese sauce. Whole-milk yogurt. Fats and oils Meat fat, or shortening. Cocoa butter, hydrogenated oils, palm oil, coconut oil, palm kernel oil. Solid fats and shortenings, including bacon fat, salt pork, lard, and butter. Nondairy cream substitutes. Salad dressings with cheese or sour cream. Beverages Regular sodas and any drinks with added sugar. Sweets and desserts Frosting. Pudding. Cookies. Cakes. Pies. Milk chocolate or white chocolate. Buttered syrups. Full-fat ice cream or ice cream drinks. The items listed above may not be a complete list of  foods and beverages to avoid. Contact a dietitian for more information. Summary Heart-healthy meal planning includes limiting unhealthy fats, increasing healthy fats, and making other diet and lifestyle changes. Lose weight if you are overweight. Losing just 5-10% of your body weight can help your overall health and prevent diseases such as diabetes and heart disease. Focus on eating a balance of foods, including fruits and vegetables, low-fat or nonfat dairy, lean protein, nuts and legumes, whole grains, and heart-healthy oils and fats. This information is not intended to replace advice given to you by your health care provider. Make sure you discuss any questions you have with your health care provider. Document Revised: 08/09/2020 Document Reviewed: 08/09/2020 Elsevier Patient Education  2022 [employer]vier Inc.  

## 2021-06-13 NOTE — Assessment & Plan Note (Signed)
C-Met and lipid profile today Encouraged her to consume a low-fat diet Continue Rosuvastatin and Fish Oil 

## 2021-06-13 NOTE — Assessment & Plan Note (Signed)
Continue MS Contin, Oxycodone, Cyclobenzaprine and Pregabalin per pain management ?We will restart Diclofenac ?Encourage regular stretching ?

## 2021-06-13 NOTE — Assessment & Plan Note (Signed)
Encourage diet for weight loss ?Unable to exercise ?

## 2021-06-13 NOTE — Telephone Encounter (Signed)
Requested Prescriptions  ?Pending Prescriptions Disp Refills  ?? metoprolol succinate (TOPROL-XL) 50 MG 24 hr tablet [Pharmacy Med Name: METOPROLOL SUCCINATE ER 50 MG TAB] 90 tablet 0  ?  Sig: TAKE 1 TABLET BY MOUTH AT BEDTIME  ?  ? Cardiovascular:  Beta Blockers Passed - 06/13/2021  4:11 PM  ?  ?  Passed - Last BP in normal range  ?  BP Readings from Last 1 Encounters:  ?06/13/21 126/77  ?   ?  ?  Passed - Last Heart Rate in normal range  ?  Pulse Readings from Last 1 Encounters:  ?06/13/21 79  ?   ?  ?  Passed - Valid encounter within last 6 months  ?  Recent Outpatient Visits   ?      ? Today Acquired hypothyroidism  ? [facility] [place], [place], NP  ? 10 months ago Acute cystitis with hematuria  ? [facility] [place], Netta Neat, DO  ? 10 months ago Chronic midline low back pain with right-sided sciatica  ? [facility] [place], Netta Neat, DO  ? 1 year ago Acute non-recurrent frontal sinusitis  ? [facility] [place], Netta Neat, DO  ? 1 year ago Hypertension, unspecified type  ? [facility], Jodelle Gross, FNP  ?  ?  ?Future Appointments   ?        ? In 6 months Baity, Salvadore [place], NP [facility]  ?  ? ?  ?  ?  ? ? ?

## 2021-06-14 ENCOUNTER — Ambulatory Visit: Payer: Self-pay

## 2021-06-14 DIAGNOSIS — E039 Hypothyroidism, unspecified: Secondary | ICD-10-CM

## 2021-06-14 LAB — CBC
HCT: 45.7 % — ABNORMAL HIGH (ref 35.0–45.0)
Hemoglobin: 15.3 g/dL (ref 11.7–15.5)
MCH: 31.2 pg (ref 27.0–33.0)
MCHC: 33.5 g/dL (ref 32.0–36.0)
MCV: 93.3 fL (ref 80.0–100.0)
MPV: 12.3 fL (ref 7.5–12.5)
Platelets: 155 10*3/uL (ref 140–400)
RBC: 4.9 10*6/uL (ref 3.80–5.10)
RDW: 11.8 % (ref 11.0–15.0)
WBC: 6.8 10*3/uL (ref 3.8–10.8)

## 2021-06-14 LAB — LIPID PANEL
Cholesterol: 122 mg/dL (ref <200)
HDL: 38 mg/dL — ABNORMAL LOW (ref >=50)
LDL Cholesterol (Calc): 63 mg/dL (calc)
Non-HDL Cholesterol (Calc): 84 mg/dL (calc) (ref <130)
Total CHOL/HDL Ratio: 3.2 (calc) (ref <5.0)
Triglycerides: 119 mg/dL (ref <150)

## 2021-06-14 LAB — COMPLETE METABOLIC PANEL WITH GFR
AG Ratio: 1.4 (calc) (ref 1.0–2.5)
ALT: 13 U/L (ref 6–29)
AST: 19 U/L (ref 10–35)
Albumin: 3.7 g/dL (ref 3.6–5.1)
Alkaline phosphatase (APISO): 92 U/L (ref 37–153)
BUN/Creatinine Ratio: 9 (calc) (ref 6–22)
BUN: 6 mg/dL — ABNORMAL LOW (ref 7–25)
CO2: 30 mmol/L (ref 20–32)
Calcium: 9.2 mg/dL (ref 8.6–10.4)
Chloride: 103 mmol/L (ref 98–110)
Creat: 0.67 mg/dL (ref 0.50–1.05)
Globulin: 2.7 g/dL (calc) (ref 1.9–3.7)
Glucose, Bld: 95 mg/dL (ref 65–99)
Potassium: 3.6 mmol/L (ref 3.5–5.3)
Sodium: 141 mmol/L (ref 135–146)
Total Bilirubin: 0.6 mg/dL (ref 0.2–1.2)
Total Protein: 6.4 g/dL (ref 6.1–8.1)
eGFR: 99 mL/min/{1.73_m2} (ref >=60)

## 2021-06-14 LAB — HEMOGLOBIN A1C
Hgb A1c MFr Bld: 5.3 % of total Hgb (ref <5.7)
Mean Plasma Glucose: 105 mg/dL
eAG (mmol/L): 5.8 mmol/L

## 2021-06-14 LAB — TSH: TSH: 0.21 mIU/L — ABNORMAL LOW (ref 0.40–4.50)

## 2021-06-14 LAB — T4, FREE: Free T4: 1.4 ng/dL (ref 0.8–1.8)

## 2021-06-14 NOTE — Telephone Encounter (Signed)
Pt was given lab results from [place], NP on 06/14/21. Pt agrees to try levothyroxine and wants it sent to [facility]. Pt was scheduled for TSH recheck on 07/15/21.  ? ?Summary: Pharmacy say that dose of SYNTHROID 100 MCG tablet  ? Patient called in stated that she requested a refill on her medication SYNTHROID 100 MCG tablet but was told by the pharmacy that [facility] need her to call since her dose need to be changed. Please advise call patient at Ph# [phone]   ?  ? ?

## 2021-06-17 MED ORDER — LEVOTHYROXINE SODIUM 88 MCG PO TABS: 88.0000 ug | ORAL_TABLET | Freq: Every day | ORAL | 1 refills | Status: DC

## 2021-06-17 NOTE — Addendum Note (Signed)
Addended by: Lorre Munroe on: 06/17/2021 12:00 PM ? ? Modules accepted: Orders ? ?

## 2021-06-17 NOTE — Telephone Encounter (Signed)
Synthroid 88 mcgs sent to pharmacy and future TSH ordered ?

## 2021-06-27 DIAGNOSIS — K5903 Drug induced constipation: Secondary | ICD-10-CM | POA: Diagnosis not present

## 2021-06-27 DIAGNOSIS — F17210 Nicotine dependence, cigarettes, uncomplicated: Secondary | ICD-10-CM | POA: Diagnosis not present

## 2021-06-27 DIAGNOSIS — M542 Cervicalgia: Secondary | ICD-10-CM | POA: Diagnosis not present

## 2021-06-27 DIAGNOSIS — M5416 Radiculopathy, lumbar region: Secondary | ICD-10-CM | POA: Diagnosis not present

## 2021-06-27 DIAGNOSIS — Z79891 Long term (current) use of opiate analgesic: Secondary | ICD-10-CM | POA: Diagnosis not present

## 2021-06-27 DIAGNOSIS — M545 Low back pain, unspecified: Secondary | ICD-10-CM | POA: Diagnosis not present

## 2021-06-27 DIAGNOSIS — G894 Chronic pain syndrome: Secondary | ICD-10-CM | POA: Diagnosis not present

## 2021-07-03 DIAGNOSIS — M542 Cervicalgia: Secondary | ICD-10-CM | POA: Diagnosis not present

## 2021-07-03 DIAGNOSIS — G894 Chronic pain syndrome: Secondary | ICD-10-CM | POA: Diagnosis not present

## 2021-07-15 ENCOUNTER — Other Ambulatory Visit: Payer: Medicare Other

## 2021-07-15 DIAGNOSIS — E039 Hypothyroidism, unspecified: Secondary | ICD-10-CM

## 2021-07-18 ENCOUNTER — Other Ambulatory Visit: Payer: Medicare Other

## 2021-07-18 DIAGNOSIS — E039 Hypothyroidism, unspecified: Secondary | ICD-10-CM | POA: Diagnosis not present

## 2021-07-19 LAB — TSH: TSH: 1.74 mIU/L (ref 0.40–4.50)

## 2021-07-25 DIAGNOSIS — M545 Low back pain, unspecified: Secondary | ICD-10-CM | POA: Diagnosis not present

## 2021-07-25 DIAGNOSIS — Z79891 Long term (current) use of opiate analgesic: Secondary | ICD-10-CM | POA: Diagnosis not present

## 2021-07-25 DIAGNOSIS — G894 Chronic pain syndrome: Secondary | ICD-10-CM | POA: Diagnosis not present

## 2021-07-25 DIAGNOSIS — K5903 Drug induced constipation: Secondary | ICD-10-CM | POA: Diagnosis not present

## 2021-07-25 DIAGNOSIS — M542 Cervicalgia: Secondary | ICD-10-CM | POA: Diagnosis not present

## 2021-07-25 DIAGNOSIS — F17210 Nicotine dependence, cigarettes, uncomplicated: Secondary | ICD-10-CM | POA: Diagnosis not present

## 2021-07-25 DIAGNOSIS — M5416 Radiculopathy, lumbar region: Secondary | ICD-10-CM | POA: Diagnosis not present

## 2021-08-09 ENCOUNTER — Ambulatory Visit (INDEPENDENT_AMBULATORY_CARE_PROVIDER_SITE_OTHER): Payer: Medicare Other

## 2021-08-09 VITALS — Wt 184.0 lb

## 2021-08-09 DIAGNOSIS — N200 Calculus of kidney: Secondary | ICD-10-CM | POA: Insufficient documentation

## 2021-08-09 DIAGNOSIS — Z0000 Encounter for general adult medical examination without abnormal findings: Secondary | ICD-10-CM | POA: Diagnosis not present

## 2021-08-09 DIAGNOSIS — M1990 Unspecified osteoarthritis, unspecified site: Secondary | ICD-10-CM | POA: Insufficient documentation

## 2021-08-09 DIAGNOSIS — E079 Disorder of thyroid, unspecified: Secondary | ICD-10-CM | POA: Insufficient documentation

## 2021-08-09 DIAGNOSIS — J309 Allergic rhinitis, unspecified: Secondary | ICD-10-CM | POA: Insufficient documentation

## 2021-08-09 NOTE — Progress Notes (Signed)
?Virtual Visit via Telephone Note ? ?I connected with  April Soto on 08/09/21 at  3:15 PM EDT by telephone and verified that I am speaking with the correct person using two identifiers. ? ?Location: ?Patient: home ?Provider: [facility] ?Persons participating in the virtual visit: patient/Nurse Health Advisor ?  ?I discussed the limitations, risks, security and privacy concerns of performing an evaluation and management service by telephone and the availability of in person appointments. The patient expressed understanding and agreed to proceed. ? ?Interactive audio and video telecommunications were attempted between this nurse and patient, however failed, due to patient having technical difficulties OR patient did not have access to video capability.  We continued and completed visit with audio only. ? ?Some vital signs may be absent or patient reported.  ? ?Dionisio David, LPN ? ?Subjective:  ? April Soto is a 61 y.o. female who presents for Medicare Annual (Subsequent) preventive examination. ? ?Review of Systems    ? ?  ? ?   ?Objective:  ?  ?Today's Vitals  ? 08/09/21 1516  ?PainSc: 6   ? ?There is no height or weight on file to calculate BMI. ? ? ?  05/10/2021  ? 12:28 PM 08/17/2017  ?  4:00 PM 03/13/2017  ? 12:11 PM 02/22/2016  ? 12:06 AM 12/12/2015  ?  4:00 PM 11/28/2015  ? 11:45 AM 04/02/2014  ?  4:03 PM  ?Advanced Directives  ?Does Patient Have a Medical Advance Directive? No  No No No No No  ?Would patient like information on creating a medical advance directive?   No - Patient declined  No - patient declined information    ?  ? Information is confidential and restricted. Go to Review Flowsheets to unlock data.  ? ? ?Current Medications (verified) ?Outpatient Encounter Medications as of 08/09/2021  ?Medication Sig  ? acyclovir (ZOVIRAX) 400 MG tablet Take 1 tablet (400 mg total) by mouth daily.  ? albuterol (VENTOLIN HFA) 108 (90 Base) MCG/ACT inhaler Inhale 2 puffs into the lungs every 4 (four) hours as  needed for wheezing or shortness of breath.   ? amoxicillin (AMOXIL) 500 MG capsule Take 500 mg by mouth 3 (three) times daily.  ? cyclobenzaprine (FLEXERIL) 10 MG tablet Take 10 mg by mouth 3 (three) times daily.  ? diclofenac (VOLTAREN) 75 MG EC tablet Take 1 tablet (75 mg total) by mouth 2 (two) times daily.  ? esomeprazole (NEXIUM) 40 MG capsule Take 1 capsule (40 mg total) by mouth daily.  ? fluticasone (FLONASE) 50 MCG/ACT nasal spray Place 1 spray into both nostrils at bedtime as needed for allergies.   ? hydrocortisone (ANUSOL-HC) 2.5 % rectal cream Apply 1 application topically as needed for hemorrhoids.   ? hydrOXYzine (ATARAX) 25 MG tablet Take 25 mg by mouth daily.  ? hydrOXYzine (ATARAX/VISTARIL) 10 MG tablet Take 10 mg by mouth 2 (two) times daily.  ? levothyroxine (SYNTHROID) 88 MCG tablet Take 1 tablet (88 mcg total) by mouth daily.  ? metoprolol succinate (TOPROL-XL) 50 MG 24 hr tablet TAKE 1 TABLET BY MOUTH AT BEDTIME  ? morphine (MS CONTIN) 15 MG 12 hr tablet Take 15 mg by mouth 3 (three) times daily.  ? MOVANTIK 25 MG TABS tablet Take 25 mg by mouth daily.  ? nitrofurantoin, macrocrystal-monohydrate, (MACROBID) 100 MG capsule Take 100 mg by mouth 2 (two) times daily.  ? Olopatadine HCl 0.2 % SOLN Place 1 drop into both eyes at bedtime as needed (allergies).  ?  omega-3 acid ethyl esters (LOVAZA) 1 g capsule Take 2 capsules (2 g total) by mouth 2 (two) times daily.  ? ondansetron (ZOFRAN-ODT) 4 MG disintegrating tablet Take 1 tablet (4 mg total) by mouth every 8 (eight) hours as needed for nausea or vomiting.  ? Oxycodone HCl 10 MG TABS Take 10 mg by mouth 4 (four) times daily as needed.  ? oxyCODONE-acetaminophen (PERCOCET) 10-325 MG tablet Take 1 tablet by mouth 2 (two) times daily as needed.  ? pregabalin (LYRICA) 100 MG capsule Take 100 mg by mouth 3 (three) times daily.  ? pregabalin (LYRICA) 150 MG capsule Take 150 mg by mouth 3 (three) times daily.  ? rosuvastatin (CRESTOR) 40 MG tablet TAKE  1 TABLET BY MOUTH DAILY  ? SPIRIVA HANDIHALER 18 MCG inhalation capsule Place 1 capsule (18 mcg total) into inhaler and inhale daily.  ? SYMBICORT 160-4.5 MCG/ACT inhaler INHALE 2 PUFFS BY MOUTH TWICE DAILY (MAINTENANCE INHALER). RINSE MOUTH WELL AFTER USE TO PREVENT THRUSH.  ? venlafaxine XR (EFFEXOR-XR) 150 MG 24 hr capsule Take 1 capsule (150 mg total) by mouth every evening. For mood control  ? ziprasidone (GEODON) 40 MG capsule TAKE 1 CAPSULE BY MOUTH EVERY EVENING WITH FOOD FOR MOOD  ? ?No facility-administered encounter medications on file as of 08/09/2021.  ? ? ?Allergies (verified) ?Patient has no known allergies.  ? ?History: ?Past Medical History:  ?Diagnosis Date  ? Anemia   ? "years ago"  ? Anxiety   ? takes Ativan daily  ? Asthma   ? Bipolar 1 disorder ([place])   ? Chronic back pain   ? HNP  ? Constipation   ? takes  & Noble daily  ? Degenerative disc disease   ? Depression   ? was on Prozac but taken off by MD for a couple of weeks  ? Depression   ? Fibromyalgia   ? takes Lyrica daily  ? Genital herpes   ? GERD (gastroesophageal reflux disease)   ? takes Nexium daily  ? Headache(784.0)   ? Heart murmur   ? "grew out of"  ? History of bronchitis   ? last time 15+yrs ago  ? History of rheumatic fever   ? Hyperlipidemia   ? takes Simvastatin daily  ? Hypertension   ? takes Coreg daily  ? Hypothyroidism   ? takes Synthroid daily  ? Joint pain   ? Muscle spasm   ? takes Flexeril daily as needed  ? Nocturia   ? Osteoarthritis   ? Peripheral edema   ? takes HCTZ daily  ? Pneumonia   ? hx of;last time 30+yrs ago  ? Raynaud's disease   ? Rheumatic fever   ? as a child  ? Seasonal allergies   ? takes Zyrtec daily and uses Dymista as well  ? Stroke [facility])   ? ? 5 years ago. No lasting deficits  ? Urinary incontinence   ? Weakness   ? nmbness and tingling-right leg  ? ?Past Surgical History:  ?Procedure Laterality Date  ? ABDOMINAL HYSTERECTOMY    ? BACK SURGERY    ? CHOLECYSTECTOMY    ? COLONOSCOPY    ?  ESOPHAGOGASTRODUODENOSCOPY    ? FOOT SURGERY Right 12/2013  ? ankle, heel and top of foot  ? LUMBAR LAMINECTOMY/DECOMPRESSION MICRODISCECTOMY Right 01/14/2013  ? Procedure: RIGHT LUMBAR FOUR FIVE  LAMINECTOMY/DECOMPRESSION MICRODISCECTOMY 1 LEVEL;  Surgeon: Faythe Ghee, MD;  Location: [facility];  Service: Neurosurgery;  Laterality: Right;  RIGHT L45 microdiskectomy  ?  OTHER SURGICAL HISTORY    ? Gastric stapling for weight loss. patient reports done in 1980's  ? SEPTOPLASTY    ? TONSILLECTOMY    ? TUBAL LIGATION    ? ?Family History  ?Problem Relation Age of Onset  ? Diabetes type II Mother   ? Hypertension Mother   ? Diabetes Mother   ? Diabetes Sister   ? Hypertension Sister   ? Hypertension Maternal Grandmother   ? Diabetes Maternal Grandmother   ? Breast cancer Neg Hx   ? ?Social History  ? ?Socioeconomic History  ? Marital status: Divorced  ?  Spouse name: Not on file  ? Number of children: Not on file  ? Years of education: Not on file  ? Highest education level: Not on file  ?Occupational History  ? Not on file  ?Tobacco Use  ? Smoking status: Some Days  ?  Packs/day: 1.00  ?  Years: 40.00  ?  Pack years: 40.00  ?  Types: Cigarettes  ? Smokeless tobacco: Never  ?Vaping Use  ? Vaping Use: Never used  ?Substance and Sexual Activity  ? Alcohol use: No  ? Drug use: No  ? Sexual activity: Not Currently  ?  Birth control/protection: Surgical  ?Other Topics Concern  ? Not on file  ?Social History Narrative  ? Not on file  ? ?Social Determinants of Health  ? ?Financial Resource Strain: Not on file  ?Food Insecurity: Not on file  ?Transportation Needs: Not on file  ?Physical Activity: Not on file  ?Stress: Not on file  ?Social Connections: Not on file  ? ? ?Tobacco Counseling ?Ready to quit: Not Answered ?Counseling given: Not Answered ? ? ?Clinical Intake: ? ?Pre-visit preparation completed: Yes ? ?Pain : 0-10 ?Pain Score: 6  ?Pain Location: Back ?Pain Orientation: Lower ?Pain Descriptors / Indicators: Aching,  Tightness, Constant ?Pain Onset: In the past 7 days ? ?  ? ?Nutritional Risks: None ?Diabetes: No ? ?How often do you need to have someone help you when you read instructions, pamphlets, or other written materia

## 2021-08-09 NOTE — Patient Instructions (Signed)
April Soto , ?Thank you for taking time to come for your Medicare Wellness Visit. I appreciate your ongoing commitment to your health goals. Please review the following plan we discussed and let me know if I can assist you in the future.  ? ?Screening recommendations/referrals: ?Colonoscopy: declined referral ?Mammogram: declined referral ?Bone Density: declined referral ?Recommended yearly ophthalmology/optometry visit for glaucoma screening and checkup ?Recommended yearly dental visit for hygiene and checkup ? ?Vaccinations: ?Influenza vaccine: n/d ?Pneumococcal vaccine: n/d ?Tdap vaccine: n/d ?Shingles vaccine: n/d  ?Covid-19: n/d ? ?Advanced directives: no ? ?Conditions/risks identified: none ? ?Next appointment: Follow up in one year for your annual wellness visit. 08/15/22 @ 1:30pm by phone ? ?Preventive Care 40-64 Years, Female ?Preventive care refers to lifestyle choices and visits with your health care provider that can promote health and wellness. ?What does preventive care include? ?A yearly physical exam. This is also called an annual well check. ?Dental exams once or twice a year. ?Routine eye exams. Ask your health care provider how often you should have your eyes checked. ?Personal lifestyle choices, including: ?Daily care of your teeth and gums. ?Regular physical activity. ?Eating a healthy diet. ?Avoiding tobacco and drug use. ?Limiting alcohol use. ?Practicing safe sex. ?Taking low-dose aspirin daily starting at age 39. ?Taking vitamin and mineral supplements as recommended by your health care provider. ?What happens during an annual well check? ?The services and screenings done by your health care provider during your annual well check will depend on your age, overall health, lifestyle risk factors, and family history of disease. ?Counseling  ?Your health care provider may ask you questions about your: ?Alcohol use. ?Tobacco use. ?Drug use. ?Emotional well-being. ?Home and relationship  well-being. ?Sexual activity. ?Eating habits. ?Work and work Statistician. ?Method of birth control. ?Menstrual cycle. ?Pregnancy history. ?Screening  ?You may have the following tests or measurements: ?Height, weight, and BMI. ?Blood pressure. ?Lipid and cholesterol levels. These may be checked every 5 years, or more frequently if you are over 30 years old. ?Skin check. ?Lung cancer screening. You may have this screening every year starting at age 69 if you have a 30-pack-year history of smoking and currently smoke or have quit within the past 15 years. ?Fecal occult blood test (FOBT) of the stool. You may have this test every year starting at age 59. ?Flexible sigmoidoscopy or colonoscopy. You may have a sigmoidoscopy every 5 years or a colonoscopy every 10 years starting at age 53. ?Hepatitis C blood test. ?Hepatitis B blood test. ?Sexually transmitted disease (STD) testing. ?Diabetes screening. This is done by checking your blood sugar (glucose) after you have not eaten for a while (fasting). You may have this done every 1-3 years. ?Mammogram. This may be done every 1-2 years. Talk to your health care provider about when you should start having regular mammograms. This may depend on whether you have a family history of breast cancer. ?BRCA-related cancer screening. This may be done if you have a family history of breast, ovarian, tubal, or peritoneal cancers. ?Pelvic exam and Pap test. This may be done every 3 years starting at age 38. Starting at age 48, this may be done every 5 years if you have a Pap test in combination with an HPV test. ?Bone density scan. This is done to screen for osteoporosis. You may have this scan if you are at high risk for osteoporosis. ?Discuss your test results, treatment options, and if necessary, the need for more tests with your health care provider. ?Vaccines  ?  Your health care provider may recommend certain vaccines, such as: ?Influenza vaccine. This is recommended every  year. ?Tetanus, diphtheria, and acellular pertussis (Tdap, Td) vaccine. You may need a Td booster every 10 years. ?Zoster vaccine. You may need this after age 60. ?Pneumococcal 13-valent conjugate (PCV13) vaccine. You may need this if you have certain conditions and were not previously vaccinated. ?Pneumococcal polysaccharide (PPSV23) vaccine. You may need one or two doses if you smoke cigarettes or if you have certain conditions. ?Talk to your health care provider about which screenings and vaccines you need and how often you need them. ?This information is not intended to replace advice given to you by your health care provider. Make sure you discuss any questions you have with your health care provider. ?Document Released: 04/27/2015 Document Revised: 12/19/2015 Document Reviewed: 01/30/2015 ?Elsevier Interactive Patient Education ? 2017 Elsevier Inc. ? ? ? ?Fall Prevention in the Home ?Falls can cause injuries. They can happen to people of all ages. There are many things you can do to make your home safe and to help prevent falls. ?What can I do on the outside of my home? ?Regularly fix the edges of walkways and driveways and fix any cracks. ?Remove anything that might make you trip as you walk through a door, such as a raised step or threshold. ?Trim any bushes or trees on the path to your home. ?Use bright outdoor lighting. ?Clear any walking paths of anything that might make someone trip, such as rocks or tools. ?Regularly check to see if handrails are loose or broken. Make sure that both sides of any steps have handrails. ?Any raised decks and porches should have guardrails on the edges. ?Have any leaves, snow, or ice cleared regularly. ?Use sand or salt on walking paths during winter. ?Clean up any spills in your garage right away. This includes oil or grease spills. ?What can I do in the bathroom? ?Use night lights. ?Install grab bars by the toilet and in the tub and shower. Do not use towel bars as grab  bars. ?Use non-skid mats or decals in the tub or shower. ?If you need to sit down in the shower, use a plastic, non-slip stool. ?Keep the floor dry. Clean up any water that spills on the floor as soon as it happens. ?Remove soap buildup in the tub or shower regularly. ?Attach bath mats securely with double-sided non-slip rug tape. ?Do not have throw rugs and other things on the floor that can make you trip. ?What can I do in the bedroom? ?Use night lights. ?Make sure that you have a light by your bed that is easy to reach. ?Do not use any sheets or blankets that are too big for your bed. They should not hang down onto the floor. ?Have a firm chair that has side arms. You can use this for support while you get dressed. ?Do not have throw rugs and other things on the floor that can make you trip. ?What can I do in the kitchen? ?Clean up any spills right away. ?Avoid walking on wet floors. ?Keep items that you use a lot in easy-to-reach places. ?If you need to reach something above you, use a strong step stool that has a grab bar. ?Keep electrical cords out of the way. ?Do not use floor polish or wax that makes floors slippery. If you must use wax, use non-skid floor wax. ?Do not have throw rugs and other things on the floor that can make you trip. ?What   can I do with my stairs? ?Do not leave any items on the stairs. ?Make sure that there are handrails on both sides of the stairs and use them. Fix handrails that are broken or loose. Make sure that handrails are as long as the stairways. ?Check any carpeting to make sure that it is firmly attached to the stairs. Fix any carpet that is loose or worn. ?Avoid having throw rugs at the top or bottom of the stairs. If you do have throw rugs, attach them to the floor with carpet tape. ?Make sure that you have a light switch at the top of the stairs and the bottom of the stairs. If you do not have them, ask someone to add them for you. ?What else can I do to help prevent  falls? ?Wear shoes that: ?Do not have high heels. ?Have rubber bottoms. ?Are comfortable and fit you well. ?Are closed at the toe. Do not wear sandals. ?If you use a stepladder: ?Make sure that it is fully opened. Do not climb

## 2021-08-22 ENCOUNTER — Other Ambulatory Visit: Payer: Self-pay | Admitting: Internal Medicine

## 2021-08-22 DIAGNOSIS — E039 Hypothyroidism, unspecified: Secondary | ICD-10-CM

## 2021-08-22 NOTE — Telephone Encounter (Signed)
Requested Prescriptions  ?Pending Prescriptions Disp Refills  ?? levothyroxine (SYNTHROID) 88 MCG tablet [Pharmacy Med Name: LEVOTHYROXINE SODIUM 88 MCG TAB] 30 tablet 1  ?  Sig: TAKE 1 TABLET BY MOUTH DAILY  ?  ? Endocrinology:  Hypothyroid Agents Passed - 08/22/2021  9:50 AM  ?  ?  Passed - TSH in normal range and within 360 days  ?  TSH  ?Date Value Ref Range Status  ?07/18/2021 1.74 0.40 - 4.50 mIU/L Final  ?   ?  ?  Passed - Valid encounter within last 12 months  ?  Recent Outpatient Visits   ?      ? 2 months ago Acquired hypothyroidism  ? [facility] [place], [place], NP  ? 1 year ago Acute cystitis with hematuria  ? [facility] [place], Netta Neat, DO  ? 1 year ago Chronic midline low back pain with right-sided sciatica  ? [facility] [place], Netta Neat, DO  ? 1 year ago Acute non-recurrent frontal sinusitis  ? [facility] [place], Netta Neat, DO  ? 1 year ago Hypertension, unspecified type  ? [facility], Jodelle Gross, FNP  ?  ?  ?Future Appointments   ?        ? In 3 months [place], Salvadore Oxford, NP [facility], [facility]  ?  ? ?  ?  ?  ? ? ?

## 2021-09-02 ENCOUNTER — Encounter: Payer: Self-pay | Admitting: Family Medicine

## 2021-09-02 ENCOUNTER — Ambulatory Visit (INDEPENDENT_AMBULATORY_CARE_PROVIDER_SITE_OTHER): Payer: Medicare Other | Admitting: Family Medicine

## 2021-09-02 VITALS — BP 128/66 | HR 74 | Wt 187.2 lb

## 2021-09-02 DIAGNOSIS — G894 Chronic pain syndrome: Secondary | ICD-10-CM | POA: Diagnosis not present

## 2021-09-02 DIAGNOSIS — N3281 Overactive bladder: Secondary | ICD-10-CM | POA: Diagnosis not present

## 2021-09-02 DIAGNOSIS — M542 Cervicalgia: Secondary | ICD-10-CM | POA: Diagnosis not present

## 2021-09-02 MED ORDER — MIRABEGRON ER 50 MG PO TB24: 50.0000 mg | ORAL_TABLET | Freq: Every day | ORAL | 2 refills | Status: DC

## 2021-09-02 NOTE — Patient Instructions (Addendum)
Thank you for coming to the office today.  [facility] -1st floor [address] [place],  [place]  [address] Phone: [phone]  Referral sent, stay tuned for apt with Urologist  Start Myrbetriq 50mg  daily for urinary urge and leakage.   Please schedule a Follow-up Appointment to: Return if symptoms worsen or fail to improve.  If you have any other questions or concerns, please feel free to call the office or send a message through MyChart. You may also schedule an earlier appointment if necessary.  Additionally, you may be receiving a survey about your experience at our office within a few days to 1 week by e-mail or mail. We value your feedback.  , DO [facility], [facility]

## 2021-09-02 NOTE — Progress Notes (Signed)
Subjective:    Patient ID: April Soto, female    DOB: [birth year], 61 y.o.   MRN: [id number]  April Soto is a 61 y.o. female presenting on 09/02/2021 for overactive bladder  PCP Nicki Reaper, FNP   HPI  OAB / Possible Neurogenic Bladder Patient here today to follow-up on a chronic long term issue of reported >3 years of bladder urgency and incontinence. She reports has been to Urology several years ago but no significant treatment plan or improvement. She has used pads/depends before for incontinence. Not on other medication. - She has history of chronic back pain and history of CVA among other chronic illnesses - Additionally history of UTI but last documented 1 year ago. She does not endorse dysuria, hematuria, fever chills nausea vomiting       09/02/2021    3:49 PM 08/09/2021    3:21 PM 07/05/2013    1:57 PM  Depression screen PHQ 2/9  Decreased Interest 0 0 1  Down, Depressed, Hopeless 0 0 0  PHQ - 2 Score 0 0 1  Altered sleeping 0    Tired, decreased energy 0    Change in appetite 0    Feeling bad or failure about yourself  0    Trouble concentrating 0    Moving slowly or fidgety/restless 0    Suicidal thoughts 0    PHQ-9 Score 0    Difficult doing work/chores Not difficult at all      Social History   Tobacco Use   Smoking status: Some Days    Packs/day: 1.00    Years: 40.00    Pack years: 40.00    Types: Cigarettes   Smokeless tobacco: Never  Vaping Use   Vaping Use: Never used  Substance Use Topics   Alcohol use: No   Drug use: No    Review of Systems Per HPI unless specifically indicated above     Objective:    BP 128/66   Pulse 74   Wt 187 lb 3.2 oz (84.9 kg)   SpO2 97%   BMI 30.21 kg/m   Wt Readings from Last 3 Encounters:  09/02/21 187 lb 3.2 oz (84.9 kg)  08/09/21 184 lb (83.5 kg)  06/13/21 184 lb (83.5 kg)    Physical Exam Vitals and nursing note reviewed.  Constitutional:      General: She is not in acute distress.     Appearance: Normal appearance. She is well-developed. She is not diaphoretic.     Comments: Well-appearing, comfortable, cooperative  HENT:     Head: Normocephalic and atraumatic.  Eyes:     General:        Right eye: No discharge.        Left eye: No discharge.     Conjunctiva/sclera: Conjunctivae normal.  Cardiovascular:     Rate and Rhythm: Normal rate.  Pulmonary:     Effort: Pulmonary effort is normal.  Skin:    General: Skin is warm and dry.     Findings: No erythema or rash.  Neurological:     Mental Status: She is alert and oriented to person, place, and time.  Psychiatric:        Mood and Affect: Mood normal.        Behavior: Behavior normal.        Thought Content: Thought content normal.     Comments: Well groomed, good eye contact, normal speech and thoughts   Results for orders placed or performed in visit  on 07/15/21  TSH  Result Value Ref Range   TSH 1.74 0.40 - 4.50 mIU/L      Assessment & Plan:   Problem List Items Addressed This Visit   None Visit Diagnoses     OAB (overactive bladder)    -  Primary   Relevant Medications   mirabegron ER (MYRBETRIQ) 50 MG TB24 tablet   Other Relevant Orders   Ambulatory referral to Urology       OAB Suspected neurogenic bladder component w/ urge vs overflow incontinence Comorbid conditions with history of CVA / Chronic back pain  No documented previous anti cholinergic trial or Urology notes  Given her other medications, risk of side effects - will bypass anti cholinergics in this patient.  Start Myrbetriq 50mg  daily for symptoms. Discussed it is higher tier may have higher cost.  Referral to Urology for further diagnostic / management.  [facility] -1st [facility] [address] [place],  [place]  [place] Phone: [phone]   Orders Placed This Encounter  Procedures   Ambulatory referral to Urology    Referral Priority:   Routine    Referral Type:    Consultation    Referral Reason:   Specialty Services Required    Requested Specialty:   Urology    Number of Visits Requested:   1     Meds ordered this encounter  Medications   mirabegron ER (MYRBETRIQ) 50 MG TB24 tablet    Sig: Take 1 tablet (50 mg total) by mouth daily.    Dispense:  30 tablet    Refill:  2      Follow up plan: Return if symptoms worsen or fail to improve.  [phone], DO [facility] 09/02/2021, 4:06 PM

## 2021-09-19 DIAGNOSIS — M542 Cervicalgia: Secondary | ICD-10-CM | POA: Diagnosis not present

## 2021-09-19 DIAGNOSIS — M545 Low back pain, unspecified: Secondary | ICD-10-CM | POA: Diagnosis not present

## 2021-09-19 DIAGNOSIS — G894 Chronic pain syndrome: Secondary | ICD-10-CM | POA: Diagnosis not present

## 2021-09-19 DIAGNOSIS — Z79891 Long term (current) use of opiate analgesic: Secondary | ICD-10-CM | POA: Diagnosis not present

## 2021-09-19 DIAGNOSIS — F17210 Nicotine dependence, cigarettes, uncomplicated: Secondary | ICD-10-CM | POA: Diagnosis not present

## 2021-09-19 DIAGNOSIS — M5416 Radiculopathy, lumbar region: Secondary | ICD-10-CM | POA: Diagnosis not present

## 2021-09-19 DIAGNOSIS — K5903 Drug induced constipation: Secondary | ICD-10-CM | POA: Diagnosis not present

## 2021-09-23 ENCOUNTER — Other Ambulatory Visit: Payer: Self-pay | Admitting: Internal Medicine

## 2021-09-23 DIAGNOSIS — E7800 Pure hypercholesterolemia, unspecified: Secondary | ICD-10-CM

## 2021-09-24 ENCOUNTER — Other Ambulatory Visit: Payer: Self-pay | Admitting: Internal Medicine

## 2021-09-24 DIAGNOSIS — I10 Essential (primary) hypertension: Secondary | ICD-10-CM

## 2021-09-24 DIAGNOSIS — E785 Hyperlipidemia, unspecified: Secondary | ICD-10-CM

## 2021-09-24 NOTE — Telephone Encounter (Signed)
Requested Prescriptions  Pending Prescriptions Disp Refills  . omega-3 acid ethyl esters (LOVAZA) 1 g capsule [Pharmacy Med Name: OMEGA-3-ACID ETHYL ESTERS 1 GM CAP] 180 capsule 1    Sig: TAKE 2 CAPSULES BY MOUTH 2 TIMES DAILY     Endocrinology:  Nutritional Agents - omega-3 acid ethyl esters Failed - 09/23/2021  5:05 PM      Failed - Lipid Panel in normal range within the last 12 months    Cholesterol  Date Value Ref Range Status  06/13/2021 122 <200 mg/dL Final   LDL Cholesterol (Calc)  Date Value Ref Range Status  06/13/2021 63 mg/dL (calc) Final    Comment:    Reference range: <100 . Desirable range <100 mg/dL for primary prevention;   <70 mg/dL for patients with CHD or diabetic patients  with > or = 2 CHD risk factors. Marland Kitchen LDL-C is now calculated using the Martin-Hopkins  calculation, which is a validated novel method providing  better accuracy than the Friedewald equation in the  estimation of LDL-C.  Cresenciano Genre et al. Annamaria Helling. MU:[id number]): 2061-2068  (http://education.QuestDiagnostics.com/faq/FAQ164)    HDL  Date Value Ref Range Status  06/13/2021 38 (L) > OR = 50 mg/dL Final   Triglycerides  Date Value Ref Range Status  06/13/2021 119 <150 mg/dL Final         Passed - Valid encounter within last 12 months    Recent Outpatient Visits          3 weeks ago OAB (overactive bladder)   [place], DO   3 months ago Acquired hypothyroidism   [facility] [place], Coralie Keens, NP   1 year ago Acute cystitis with hematuria   [place], DO   1 year ago Chronic midline low back pain with right-sided sciatica   [employer] [facility], DO   1 year ago Acute non-recurrent frontal sinusitis   [facility] O[place] Hauser, DO      Future Appointments            In 1 month MacDiarmid, Nicki Reaper, MD [place]    In 2 months [place], Coralie Keens, NP [facility], [facility]

## 2021-09-25 NOTE — Telephone Encounter (Signed)
Requested Prescriptions  Pending Prescriptions Disp Refills  . metoprolol succinate (TOPROL-XL) 50 MG 24 hr tablet [Pharmacy Med Name: METOPROLOL SUCCINATE ER 50 MG TAB] 90 tablet 0    Sig: TAKE ONE TABLET BY MOUTH AT BEDTIME     Cardiovascular:  Beta Blockers Passed - 09/24/2021  5:29 PM      Passed - Last BP in normal range    BP Readings from Last 1 Encounters:  09/02/21 128/66         Passed - Last Heart Rate in normal range    Pulse Readings from Last 1 Encounters:  09/02/21 74         Passed - Valid encounter within last 6 months    Recent Outpatient Visits          3 weeks ago OAB (overactive bladder)   [facility] Smitty Cords, DO   3 months ago Acquired hypothyroidism   [facility] [place], Salvadore Oxford, NP   1 year ago Acute cystitis with hematuria   [facility] [place], Netta Neat, DO   1 year ago Chronic midline low back pain with right-sided sciatica   [facility] Smitty Cords, DO   1 year ago Acute non-recurrent frontal sinusitis   [facility] [place], Netta Neat, DO      Future Appointments            In 1 month MacDiarmid, Lorin Picket, MD [facility]   In 2 months [place], Salvadore Oxford, NP [facility]           . rosuvastatin (CRESTOR) 40 MG tablet [Pharmacy Med Name: ROSUVASTATIN CALCIUM 40 MG TAB] 90 tablet 0    Sig: TAKE 1 TABLET BY MOUTH DAILY     Cardiovascular:  Antilipid - Statins 2 Failed - 09/24/2021  5:29 PM      Failed - Lipid Panel in normal range within the last 12 months    Cholesterol  Date Value Ref Range Status  06/13/2021 122 <200 mg/dL Final   LDL Cholesterol (Calc)  Date Value Ref Range Status  06/13/2021 63 mg/dL (calc) Final    Comment:    Reference range: <100 . Desirable range <100 mg/dL for primary prevention;   <70 mg/dL for patients with CHD or diabetic patients  with > or = 2 CHD  risk factors. Marland Kitchen LDL-C is now calculated using the Martin-Hopkins  calculation, which is a validated novel method providing  better accuracy than the Friedewald equation in the  estimation of LDL-C.  Horald Pollen et al. Lenox Ahr. 1610;960(45): 2061-2068  (http://education.[employer].com/faq/FAQ164)    HDL  Date Value Ref Range Status  06/13/2021 38 (L) > OR = 50 mg/dL Final   Triglycerides  Date Value Ref Range Status  06/13/2021 119 <150 mg/dL Final         Passed - Cr in normal range and within 360 days    Creat  Date Value Ref Range Status  06/13/2021 0.67 0.50 - 1.05 mg/dL Final         Passed - Patient is not pregnant      Passed - Valid encounter within last 12 months    Recent Outpatient Visits          3 weeks ago OAB (overactive bladder)   [facility] Smitty Cords, DO   3 months ago Acquired hypothyroidism   [facility] [place], Salvadore Oxford, NP  1 year ago Acute cystitis with hematuria   [facility] [place], Netta Neat, DO   1 year ago Chronic midline low back pain with right-sided sciatica   [facility] Smitty Cords, DO   1 year ago Acute non-recurrent frontal sinusitis   [facility] [place], Netta Neat, DO      Future Appointments            In 1 month MacDiarmid, Lorin Picket, MD [facility]   In 2 months [place], Salvadore Oxford, NP [facility], [facility]

## 2021-10-24 ENCOUNTER — Other Ambulatory Visit: Payer: Self-pay | Admitting: Internal Medicine

## 2021-10-24 DIAGNOSIS — A6000 Herpesviral infection of urogenital system, unspecified: Secondary | ICD-10-CM

## 2021-10-24 NOTE — Telephone Encounter (Signed)
Requested Prescriptions  Pending Prescriptions Disp Refills  . acyclovir (ZOVIRAX) 400 MG tablet [Pharmacy Med Name: ACYCLOVIR 400 MG TAB] 90 tablet 1    Sig: TAKE 1 TABLET BY MOUTH DAILY     Antimicrobials:  Antiviral Agents - Anti-Herpetic Passed - 10/24/2021  4:55 PM      Passed - Valid encounter within last 12 months    Recent Outpatient Visits          1 month ago OAB (overactive bladder)   [facility] Smitty Cords, DO   4 months ago Acquired hypothyroidism   [facility] [place], Salvadore Oxford, NP   1 year ago Acute cystitis with hematuria   [facility] Smitty Cords, DO   1 year ago Chronic midline low back pain with right-sided sciatica   [facility] Smitty Cords, DO   1 year ago Acute non-recurrent frontal sinusitis   [facility] [place], Netta Neat, DO      Future Appointments            In 3 weeks MacDiarmid, Lorin Picket, MD [facility]   In 1 month [place], Salvadore Oxford, NP [facility], [facility]

## 2021-10-25 ENCOUNTER — Other Ambulatory Visit: Payer: Self-pay | Admitting: Internal Medicine

## 2021-10-25 NOTE — Telephone Encounter (Signed)
Requested medication (s) are due for refill today: yes  Requested medication (s) are on the active medication list: yes  Last refill:  03/01/20  Future visit scheduled: yes  Notes to clinic:  historical med. Please assess     Requested Prescriptions  Pending Prescriptions Disp Refills   fluticasone (FLONASE) 50 MCG/ACT nasal spray [Pharmacy Med Name: FLUTICASONE PROPIONATE 50 MCG/ACT N] 16 g     Sig: INSTILL 1 SPRAY IN EACH NOSTRIL DAILY ASNEEDED FOR SEASONAL ALLERGY SYMPTOMS     Ear, Nose, and Throat: Nasal Preparations - Corticosteroids Passed - 10/25/2021 11:56 AM      Passed - Valid encounter within last 12 months    Recent Outpatient Visits           1 month ago OAB (overactive bladder)   [facility] Smitty Cords, DO   4 months ago Acquired hypothyroidism   [facility] [place], Salvadore Oxford, NP   1 year ago Acute cystitis with hematuria   [facility] Smitty Cords, DO   1 year ago Chronic midline low back pain with right-sided sciatica   [facility] Smitty Cords, DO   1 year ago Acute non-recurrent frontal sinusitis   [facility] [place], Netta Neat, DO       Future Appointments             In 3 weeks MacDiarmid, Lorin Picket, MD [facility]   In 1 month [place], Salvadore Oxford, NP [facility]            Signed Prescriptions Disp Refills   SYMBICORT 160-4.5 MCG/ACT inhaler 10.2 g 2    Sig: INHALE 2 PUFFS BY MOUTH TWICE DAILY (MAINTENANCE INHALER). RINSE MOUTH WELL AFTER USE TO PREVENT THRUSH.     Pulmonology:  Combination Products Passed - 10/25/2021 11:56 AM      Passed - Valid encounter within last 12 months    Recent Outpatient Visits           1 month ago OAB (overactive bladder)   [facility] Smitty Cords, DO   4 months ago Acquired hypothyroidism   [facility] [place],  Salvadore Oxford, NP   1 year ago Acute cystitis with hematuria   [facility] Smitty Cords, DO   1 year ago Chronic midline low back pain with right-sided sciatica   [facility] Smitty Cords, DO   1 year ago Acute non-recurrent frontal sinusitis   [facility] [place], Netta Neat, DO       Future Appointments             In 3 weeks MacDiarmid, Lorin Picket, MD [facility]   In 1 month [place], Salvadore Oxford, NP [facility], [facility]

## 2021-10-25 NOTE — Telephone Encounter (Signed)
Requested Prescriptions  Pending Prescriptions Disp Refills  . fluticasone (FLONASE) 50 MCG/ACT nasal spray [Pharmacy Med Name: FLUTICASONE PROPIONATE 50 MCG/ACT N] 16 g     Sig: INSTILL 1 SPRAY IN EACH NOSTRIL DAILY ASNEEDED FOR SEASONAL ALLERGY SYMPTOMS     Ear, Nose, and Throat: Nasal Preparations - Corticosteroids Passed - 10/25/2021 11:56 AM      Passed - Valid encounter within last 12 months    Recent Outpatient Visits          1 month ago OAB (overactive bladder)   [facility] Smitty Cords, DO   4 months ago Acquired hypothyroidism   [facility] [place], Salvadore Oxford, NP   1 year ago Acute cystitis with hematuria   [facility] Smitty Cords, DO   1 year ago Chronic midline low back pain with right-sided sciatica   [facility] Smitty Cords, DO   1 year ago Acute non-recurrent frontal sinusitis   [facility] Althe[facility] Charon, Netta Neat, DO      Future Appointments            In 3 weeks MacDiarmid, Lorin Picket, MD [facility]   In 1 month [place], Salvadore Oxford, NP [facility]           . SYMBICORT 160-4.5 MCG/ACT inhaler [Pharmacy Med Name: SYMBICORT 160-4.5 MCG/ACT INH AERO] 10.2 g 0    Sig: INHALE 2 PUFFS BY MOUTH TWICE DAILY (MAINTENANCE INHALER). RINSE MOUTH WELL AFTER USE TO PREVENT THRUSH.     Pulmonology:  Combination Products Passed - 10/25/2021 11:56 AM      Passed - Valid encounter within last 12 months    Recent Outpatient Visits          1 month ago OAB (overactive bladder)   [facility] Smitty Cords, DO   4 months ago Acquired hypothyroidism   [facility] [place], Salvadore Oxford, NP   1 year ago Acute cystitis with hematuria   [facility] Smitty Cords, DO   1 year ago Chronic midline low back pain with right-sided sciatica   [facility]  Smitty Cords, DO   1 year ago Acute non-recurrent frontal sinusitis   [facility] [place], Netta Neat, DO      Future Appointments            In 3 weeks MacDiarmid, Lorin Picket, MD [facility]   In 1 month [place], Salvadore Oxford, NP [facility]

## 2021-11-18 ENCOUNTER — Ambulatory Visit: Payer: Medicare Other | Admitting: Urology

## 2021-11-21 ENCOUNTER — Ambulatory Visit: Payer: Self-pay | Admitting: *Deleted

## 2021-11-21 NOTE — Telephone Encounter (Signed)
  Chief Complaint: Feet "Purple" Symptoms: H/O Raynauds x 7-8 years. States worsening past week,now both feet "Purple, almost black at times." Cool to touch, swelling, right foot>left, to ankles. Reports 40 lb weight gain in 3 weeks, "They decreased my thyroid med." SOB with exertion and at rest at times. States unable to tell if swelling, edema elsewhere.  Frequency: 1 week worsening Pertinent Negatives: Patient denies  Disposition: [x] ED /[] Urgent Care (no appt availability in office) / [] Appointment(In office/virtual)/ []  [place] [facility]/ [] Home Care/ [] Refused Recommended Disposition /[] [place] [facility]/ []  Follow-up with PCP Additional Notes: Advised ED, states will follow disposition. Reason for Disposition  Entire foot is cool or blue in comparison to other side    Both feet  Answer Assessment - Initial Assessment Questions 1. ONSET: "When did the swelling start?" (e.g., minutes, hours, days)     Feet and ankles 2. LOCATION: "What part of the leg is swollen?"  "Are both legs swollen or just one leg?"     both 3. SEVERITY: "How bad is the swelling?" (e.g., localized; mild, moderate, severe)   - Localized: Small area of swelling localized to one leg.   - MILD pedal edema: Swelling limited to foot and ankle, pitting edema < 1/4 inch (6 mm) deep, rest and elevation eliminate most or all swelling.   - MODERATE edema: Swelling of lower leg to knee, pitting edema > 1/4 inch (6 mm) deep, rest and elevation only partially reduce swelling.   - SEVERE edema: Swelling extends above knee, facial or hand swelling present.      Pitting 4. REDNESS: "Does the swelling look red or infected?"     Purplish. "Color of beet." Cool to touch, nothing new 5. PAIN: "Is the swelling painful to touch?" If Yes, ask: "How painful is it?"   (Scale 1-10; mild, moderate or severe)     No 6. FEVER: "Do you have a fever?" If Yes, ask: "What is it, how was it measured, and when did it start?"      no 7.  CAUSE: "What do you think is causing the leg swelling?"     Raynauds 8. MEDICAL HISTORY: "Do you have a history of blood clots (e.g., DVT), cancer, heart failure, kidney disease, or liver failure?"      9. RECURRENT SYMPTOM: "Have you had leg swelling before?" If Yes, ask: "When was the last time?" "What happened that time?"     Yes, not this bad. Raynauds Disease. 10. OTHER SYMPTOMS: "Do you have any other symptoms?" (e.g., chest pain, difficulty breathing)       Weight gain of 40lbs in 3 weeks, some SOB 2 weeks ago, with exertion and rest  Protocols used: Leg Swelling and Edema-A-AH

## 2021-11-22 NOTE — Telephone Encounter (Signed)
She needs to be seen by me before referral will be placed for vascular specialist.  They will want office notes to go along with the referral.

## 2021-11-22 NOTE — Telephone Encounter (Signed)
Will review ED note 

## 2021-11-22 NOTE — Telephone Encounter (Signed)
Pt. States she did not go to ED. Wants a referral to [facility]. Please advise pt.

## 2021-11-22 NOTE — Telephone Encounter (Signed)
Pt advised. She states she as an appointment schedule with you on 12/17/2021  Thanks,   -Vernona Rieger

## 2021-11-25 ENCOUNTER — Other Ambulatory Visit: Payer: Self-pay | Admitting: Internal Medicine

## 2021-11-25 DIAGNOSIS — E039 Hypothyroidism, unspecified: Secondary | ICD-10-CM

## 2021-11-26 ENCOUNTER — Ambulatory Visit: Payer: Medicare Other | Admitting: Urology

## 2021-11-26 NOTE — Progress Notes (Incomplete)
11/26/21 12:05 PM   April Soto 22-Dec-1960 [id number]  Referring provider:  Lorre Munroe, NP [address] [place],  [place] [address] No chief complaint on file.      HPI: April Soto is a 61 y.o.femalewho presents today for further evaluation of OAB symptoms.   She was seen on 09/02/2021 by Dr Althea Charon. She was noted to have >3 years of bladder urgency and incontinence.     PMH: Past Medical History:  Diagnosis Date   Anemia    "years ago"   Anxiety    takes Ativan daily   Asthma    Bipolar 1 disorder (HCC)    Chronic back pain    HNP   Constipation    takes Ra Col Rite daily   Degenerative disc disease    Depression    was on Prozac but taken off by MD for a couple of weeks   Depression    Fibromyalgia    takes Lyrica daily   Genital herpes    GERD (gastroesophageal reflux disease)    takes Nexium daily   Headache(784.0)    Heart murmur    "grew out of"   History of bronchitis    last time 15+yrs ago   History of rheumatic fever    Hyperlipidemia    takes Simvastatin daily   Hypertension    takes Coreg daily   Hypothyroidism    takes Synthroid daily   Joint pain    Muscle spasm    takes Flexeril daily as needed   Nocturia    Osteoarthritis    Peripheral edema    takes HCTZ daily   Pneumonia    hx of;last time 30+yrs ago   Raynaud's disease    Rheumatic fever    as a child   Seasonal allergies    takes Zyrtec daily and uses Dymista as well   Stroke (HCC)    ? 5 years ago. No lasting deficits   Urinary incontinence    Weakness    nmbness and tingling-right leg    Surgical History: Past Surgical History:  Procedure Laterality Date   ABDOMINAL HYSTERECTOMY     BACK SURGERY     CHOLECYSTECTOMY     COLONOSCOPY     ESOPHAGOGASTRODUODENOSCOPY     FOOT SURGERY Right 12/2013   ankle, heel and top of foot   LUMBAR LAMINECTOMY/DECOMPRESSION MICRODISCECTOMY Right 01/14/2013   Procedure: RIGHT LUMBAR FOUR FIVE   LAMINECTOMY/DECOMPRESSION MICRODISCECTOMY 1 LEVEL;  Surgeon: Reinaldo Meeker, MD;  Location: [facility];  Service: Neurosurgery;  Laterality: Right;  RIGHT L45 microdiskectomy   OTHER SURGICAL HISTORY     Gastric stapling for weight loss. patient reports done in 1980's   SEPTOPLASTY     TONSILLECTOMY     TUBAL LIGATION      Home Medications:  Allergies as of 11/26/2021   No Known Allergies      Medication List        Accurate as of November 26, 2021 12:05 PM. If you have any questions, ask your nurse or doctor.          acyclovir 400 MG tablet Commonly known as: ZOVIRAX TAKE 1 TABLET BY MOUTH DAILY   albuterol 108 (90 Base) MCG/ACT inhaler Commonly known as: VENTOLIN HFA Inhale 2 puffs into the lungs every 4 (four) hours as needed for wheezing or shortness of breath.   amoxicillin 500 MG capsule Commonly known as: AMOXIL Take 500 mg by mouth  3 (three) times daily.   cyclobenzaprine 10 MG tablet Commonly known as: FLEXERIL Take 10 mg by mouth 3 (three) times daily.   diclofenac 75 MG EC tablet Commonly known as: VOLTAREN Take 1 tablet (75 mg total) by mouth 2 (two) times daily.   esomeprazole 40 MG capsule Commonly known as: NEXIUM Take 1 capsule (40 mg total) by mouth daily.   fluticasone 50 MCG/ACT nasal spray Commonly known as: FLONASE INSTILL 1 SPRAY IN EACH NOSTRIL DAILY ASNEEDED FOR SEASONAL ALLERGY SYMPTOMS   hydrocortisone 2.5 % rectal cream Commonly known as: ANUSOL-HC Apply 1 application topically as needed for hemorrhoids.   hydrOXYzine 10 MG tablet Commonly known as: ATARAX Take 10 mg by mouth 2 (two) times daily.   hydrOXYzine 25 MG tablet Commonly known as: ATARAX Take 25 mg by mouth daily.   levothyroxine 88 MCG tablet Commonly known as: SYNTHROID TAKE 1 TABLET BY MOUTH DAILY   metoprolol succinate 50 MG 24 hr tablet Commonly known as: TOPROL-XL TAKE ONE TABLET BY MOUTH AT BEDTIME   mirabegron ER 50 MG Tb24 tablet Commonly known  as: Myrbetriq Take 1 tablet (50 mg total) by mouth daily.   morphine 15 MG 12 hr tablet Commonly known as: MS CONTIN Take 15 mg by mouth 3 (three) times daily.   Movantik 25 MG Tabs tablet Generic drug: naloxegol oxalate Take 25 mg by mouth daily.   nitrofurantoin (macrocrystal-monohydrate) 100 MG capsule Commonly known as: MACROBID Take 100 mg by mouth 2 (two) times daily.   Olopatadine HCl 0.2 % Soln Place 1 drop into both eyes at bedtime as needed (allergies).   omega-3 acid ethyl esters 1 g capsule Commonly known as: LOVAZA TAKE 2 CAPSULES BY MOUTH 2 TIMES DAILY   ondansetron 4 MG disintegrating tablet Commonly known as: ZOFRAN-ODT Take 1 tablet (4 mg total) by mouth every 8 (eight) hours as needed for nausea or vomiting.   Oxycodone HCl 10 MG Tabs Take 10 mg by mouth 4 (four) times daily as needed.   oxyCODONE-acetaminophen 10-325 MG tablet Commonly known as: PERCOCET Take 1 tablet by mouth 2 (two) times daily as needed.   pregabalin 150 MG capsule Commonly known as: LYRICA Take 150 mg by mouth 3 (three) times daily.   pregabalin 100 MG capsule Commonly known as: LYRICA Take 100 mg by mouth 3 (three) times daily.   rosuvastatin 40 MG tablet Commonly known as: CRESTOR TAKE 1 TABLET BY MOUTH DAILY   Spiriva HandiHaler 18 MCG inhalation capsule Generic drug: tiotropium Place 1 capsule (18 mcg total) into inhaler and inhale daily.   Symbicort 160-4.5 MCG/ACT inhaler Generic drug: budesonide-formoterol INHALE 2 PUFFS BY MOUTH TWICE DAILY (MAINTENANCE INHALER). RINSE MOUTH WELL AFTER USE TO PREVENT THRUSH.   venlafaxine XR 150 MG 24 hr capsule Commonly known as: EFFEXOR-XR Take 1 capsule (150 mg total) by mouth every evening. For mood control   ziprasidone 40 MG capsule Commonly known as: GEODON TAKE 1 CAPSULE BY MOUTH EVERY EVENING WITH FOOD FOR MOOD        Allergies:  No Known Allergies  Family History: Family History  Problem Relation Age of  Onset   Diabetes type II Mother    Hypertension Mother    Diabetes Mother    Diabetes Sister    Hypertension Sister    Hypertension Maternal Grandmother    Diabetes Maternal Grandmother    Breast cancer Neg Hx     Social History:  reports that she has been smoking cigarettes. She has  a 40.00 pack-year smoking history. She has never used smokeless tobacco. She reports that she does not drink alcohol and does not use drugs.   Physical Exam: There were no vitals taken for this visit.  Constitutional:  Alert and oriented, No acute distress. HEENT: [place] AT, moist mucus membranes.  Trachea midline, no masses. Cardiovascular: No clubbing, cyanosis, or edema. Respiratory: Normal respiratory effort, no increased work of breathing. Skin: No rashes, bruises or suspicious lesions. Neurologic: Grossly intact, no focal deficits, moving all 4 extremities. Psychiatric: Normal mood and affect.  Laboratory Data:  Lab Results  Component Value Date   CREATININE 0.67 06/13/2021   Lab Results  Component Value Date   HGBA1C 5.3 06/13/2021    Urinalysis   Pertinent Imaging:    Assessment & Plan:     No follow-ups on file.  I,Kailey Littlejohn,acting as a Neurosurgeon for Vanna Scotland, MD.,have documented all relevant documentation on the behalf of Vanna Scotland, MD,as directed by  Vanna Scotland, MD while in the presence of Vanna Scotland, MD.   [facility] [address], [address] [place], [place] [address] [phone]

## 2021-11-26 NOTE — Telephone Encounter (Signed)
Requested Prescriptions  Pending Prescriptions Disp Refills  . levothyroxine (SYNTHROID) 88 MCG tablet [Pharmacy Med Name: LEVOTHYROXINE SODIUM 88 MCG TAB] 90 tablet 1    Sig: TAKE 1 TABLET BY MOUTH DAILY     Endocrinology:  Hypothyroid Agents Passed - 11/25/2021  2:47 PM      Passed - TSH in normal range and within 360 days    TSH  Date Value Ref Range Status  07/18/2021 1.74 0.40 - 4.50 mIU/L Final         Passed - Valid encounter within last 12 months    Recent Outpatient Visits          2 months ago OAB (overactive bladder)   [facility] Smitty Cords, DO   5 months ago Acquired hypothyroidism   [facility] [place], Salvadore Oxford, NP   1 year ago Acute cystitis with hematuria   [facility] Smitty Cords, DO   1 year ago Chronic midline low back pain with right-sided sciatica   [facility] Smitty Cords, DO   1 year ago Acute non-recurrent frontal sinusitis   [facility] [place], Netta Neat, DO      Future Appointments            Today Vanna Scotland, MD [facility]   In 3 weeks [place], Salvadore Oxford, NP [facility]           . fluticasone (FLONASE) 50 MCG/ACT nasal spray [Pharmacy Med Name: FLUTICASONE PROPIONATE 50 MCG/ACT N] 16 g 0    Sig: INSTILL 1 SPRAY IN EACH NOSTRIL DAILY ASNEEDED FOR SEASONAL ALLERGY SYMPTOMS     Ear, Nose, and Throat: Nasal Preparations - Corticosteroids Passed - 11/25/2021  2:47 PM      Passed - Valid encounter within last 12 months    Recent Outpatient Visits          2 months ago OAB (overactive bladder)   [facility] Smitty Cords, DO   5 months ago Acquired hypothyroidism   [facility] [place], Salvadore Oxford, NP   1 year ago Acute cystitis with hematuria   [facility] Smitty Cords, DO   1 year ago Chronic midline low back  pain with right-sided sciatica   [facility] Smitty Cords, DO   1 year ago Acute non-recurrent frontal sinusitis   [facility] [place], Netta Neat, DO      Future Appointments            Today Vanna Scotland, MD [facility]   In 3 weeks [place], Salvadore Oxford, NP [facility]

## 2021-12-12 DIAGNOSIS — M545 Low back pain, unspecified: Secondary | ICD-10-CM | POA: Diagnosis not present

## 2021-12-12 DIAGNOSIS — M542 Cervicalgia: Secondary | ICD-10-CM | POA: Diagnosis not present

## 2021-12-12 DIAGNOSIS — K5903 Drug induced constipation: Secondary | ICD-10-CM | POA: Diagnosis not present

## 2021-12-12 DIAGNOSIS — Z79891 Long term (current) use of opiate analgesic: Secondary | ICD-10-CM | POA: Diagnosis not present

## 2021-12-12 DIAGNOSIS — M5416 Radiculopathy, lumbar region: Secondary | ICD-10-CM | POA: Diagnosis not present

## 2021-12-12 DIAGNOSIS — G894 Chronic pain syndrome: Secondary | ICD-10-CM | POA: Diagnosis not present

## 2021-12-12 DIAGNOSIS — F17210 Nicotine dependence, cigarettes, uncomplicated: Secondary | ICD-10-CM | POA: Diagnosis not present

## 2021-12-17 ENCOUNTER — Encounter: Payer: Self-pay | Admitting: Internal Medicine

## 2021-12-17 ENCOUNTER — Ambulatory Visit (INDEPENDENT_AMBULATORY_CARE_PROVIDER_SITE_OTHER): Payer: Medicare Other | Admitting: Internal Medicine

## 2021-12-17 VITALS — BP 116/78 | HR 76 | Temp 96.8°F | Ht 66.0 in | Wt 206.0 lb

## 2021-12-17 DIAGNOSIS — E782 Mixed hyperlipidemia: Secondary | ICD-10-CM | POA: Diagnosis not present

## 2021-12-17 DIAGNOSIS — Z6833 Body mass index (BMI) 33.0-33.9, adult: Secondary | ICD-10-CM

## 2021-12-17 DIAGNOSIS — Z23 Encounter for immunization: Secondary | ICD-10-CM | POA: Diagnosis not present

## 2021-12-17 DIAGNOSIS — E039 Hypothyroidism, unspecified: Secondary | ICD-10-CM | POA: Diagnosis not present

## 2021-12-17 DIAGNOSIS — Z0001 Encounter for general adult medical examination with abnormal findings: Secondary | ICD-10-CM | POA: Diagnosis not present

## 2021-12-17 DIAGNOSIS — Z114 Encounter for screening for human immunodeficiency virus [HIV]: Secondary | ICD-10-CM | POA: Diagnosis not present

## 2021-12-17 DIAGNOSIS — E6609 Other obesity due to excess calories: Secondary | ICD-10-CM

## 2021-12-17 MED ORDER — SYNTHROID 88 MCG PO TABS: 88.0000 ug | ORAL_TABLET | Freq: Every day | ORAL | 0 refills | Status: DC

## 2021-12-17 MED ORDER — CETIRIZINE HCL 10 MG PO TABS: 10.0000 mg | ORAL_TABLET | Freq: Every day | ORAL | 1 refills | Status: AC

## 2021-12-17 NOTE — Patient Instructions (Signed)
Health Maintenance for Postmenopausal Women Menopause is a normal process in which your ability to get pregnant comes to an end. This process happens slowly over many months or years, usually between the ages of 48 and 55. Menopause is complete when you have missed your menstrual period for 12 months. It is important to talk with your health care provider about some of the most common conditions that affect women after menopause (postmenopausal women). These include heart disease, cancer, and bone loss (osteoporosis). Adopting a healthy lifestyle and getting preventive care can help to promote your health and wellness. The actions you take can also lower your chances of developing some of these common conditions. What are the signs and symptoms of menopause? During menopause, you may have the following symptoms: Hot flashes. These can be moderate or severe. Night sweats. Decrease in sex drive. Mood swings. Headaches. Tiredness (fatigue). Irritability. Memory problems. Problems falling asleep or staying asleep. Talk with your health care provider about treatment options for your symptoms. Do I need hormone replacement therapy? Hormone replacement therapy is effective in treating symptoms that are caused by menopause, such as hot flashes and night sweats. Hormone replacement carries certain risks, especially as you become older. If you are thinking about using estrogen or estrogen with progestin, discuss the benefits and risks with your health care provider. How can I reduce my risk for heart disease and stroke? The risk of heart disease, heart attack, and stroke increases as you age. One of the causes may be a change in the body's hormones during menopause. This can affect how your body uses dietary fats, triglycerides, and cholesterol. Heart attack and stroke are medical emergencies. There are many things that you can do to help prevent heart disease and stroke. Watch your blood pressure High  blood pressure causes heart disease and increases the risk of stroke. This is more likely to develop in people who have high blood pressure readings or are overweight. Have your blood pressure checked: Every 3-5 years if you are 18-39 years of age. Every year if you are 40 years old or older. Eat a healthy diet  Eat a diet that includes plenty of vegetables, fruits, low-fat dairy products, and lean protein. Do not eat a lot of foods that are high in solid fats, added sugars, or sodium. Get regular exercise Get regular exercise. This is one of the most important things you can do for your health. Most adults should: Try to exercise for at least 150 minutes each week. The exercise should increase your heart rate and make you sweat (moderate-intensity exercise). Try to do strengthening exercises at least twice each week. Do these in addition to the moderate-intensity exercise. Spend less time sitting. Even light physical activity can be beneficial. Other tips Work with your health care provider to achieve or maintain a healthy weight. Do not use any products that contain nicotine or tobacco. These products include cigarettes, chewing tobacco, and vaping devices, such as e-cigarettes. If you need help quitting, ask your health care provider. Know your numbers. Ask your health care provider to check your cholesterol and your blood sugar (glucose). Continue to have your blood tested as directed by your health care provider. Do I need screening for cancer? Depending on your health history and family history, you may need to have cancer screenings at different stages of your life. This may include screening for: Breast cancer. Cervical cancer. Lung cancer. Colorectal cancer. What is my risk for osteoporosis? After menopause, you may be   at increased risk for osteoporosis. Osteoporosis is a condition in which bone destruction happens more quickly than new bone creation. To help prevent osteoporosis or  the bone fractures that can happen because of osteoporosis, you may take the following actions: If you are 19-50 years old, get at least 1,000 mg of calcium and at least 600 international units (IU) of vitamin D per day. If you are older than age 50 but younger than age 70, get at least 1,200 mg of calcium and at least 600 international units (IU) of vitamin D per day. If you are older than age 70, get at least 1,200 mg of calcium and at least 800 international units (IU) of vitamin D per day. Smoking and drinking excessive alcohol increase the risk of osteoporosis. Eat foods that are rich in calcium and vitamin D, and do weight-bearing exercises several times each week as directed by your health care provider. How does menopause affect my mental health? Depression may occur at any age, but it is more common as you become older. Common symptoms of depression include: Feeling depressed. Changes in sleep patterns. Changes in appetite or eating patterns. Feeling an overall lack of motivation or enjoyment of activities that you previously enjoyed. Frequent crying spells. Talk with your health care provider if you think that you are experiencing any of these symptoms. General instructions See your health care provider for regular wellness exams and vaccines. This may include: Scheduling regular health, dental, and eye exams. Getting and maintaining your vaccines. These include: Influenza vaccine. Get this vaccine each year before the flu season begins. Pneumonia vaccine. Shingles vaccine. Tetanus, diphtheria, and pertussis (Tdap) booster vaccine. Your health care provider may also recommend other immunizations. Tell your health care provider if you have ever been abused or do not feel safe at home. Summary Menopause is a normal process in which your ability to get pregnant comes to an end. This condition causes hot flashes, night sweats, decreased interest in sex, mood swings, headaches, or lack  of sleep. Treatment for this condition may include hormone replacement therapy. Take actions to keep yourself healthy, including exercising regularly, eating a healthy diet, watching your weight, and checking your blood pressure and blood sugar levels. Get screened for cancer and depression. Make sure that you are up to date with all your vaccines. This information is not intended to replace advice given to you by your health care provider. Make sure you discuss any questions you have with your health care provider. Document Revised: 08/20/2020 Document Reviewed: 08/20/2020 Elsevier Patient Education  2023 [employer]  

## 2021-12-17 NOTE — Progress Notes (Unsigned)
Subjective:    Patient ID: April Soto, female    DOB: 12/26/1960, 61 y.o.   MRN: [id number]  HPI  Pt presents to the clinic today for her annual exam.  Flu: 2022 Tetanus: < 10 years ago, [facility] Covid: never Shingrix: never Pap smear: Hysterectomy Mammogram: 03/2018 Bone density: never Colon screening: 5 years ago, [facility] Vision screening: as needed Dentist: no, no teeth  Diet: She does not eat much meat. She consumes some fruits and veggies. She does eat fried foods. She drinks mostly water, tea, soda. Exercise: Walking  Review of Systems  Past Medical History:  Diagnosis Date   Anemia    "years ago"   Anxiety    takes Ativan daily   Asthma    Bipolar 1 disorder (HCC)    Chronic back pain    HNP   Constipation    takes Ra Col Rite daily   Degenerative disc disease    Depression    was on Prozac but taken off by MD for a couple of weeks   Depression    Fibromyalgia    takes Lyrica daily   Genital herpes    GERD (gastroesophageal reflux disease)    takes Nexium daily   Headache(784.0)    Heart murmur    "grew out of"   History of bronchitis    last time 15+yrs ago   History of rheumatic fever    Hyperlipidemia    takes Simvastatin daily   Hypertension    takes Coreg daily   Hypothyroidism    takes Synthroid daily   Joint pain    Muscle spasm    takes Flexeril daily as needed   Nocturia    Osteoarthritis    Peripheral edema    takes HCTZ daily   Pneumonia    hx of;last time 30+yrs ago   Raynaud's disease    Rheumatic fever    as a child   Seasonal allergies    takes Zyrtec daily and uses Dymista as well   Stroke ([place])    ? 5 years ago. No lasting deficits   Urinary incontinence    Weakness    nmbness and tingling-right leg    Current Outpatient Medications  Medication Sig Dispense Refill   acyclovir (ZOVIRAX) 400 MG tablet TAKE 1 TABLET BY MOUTH DAILY 90 tablet 0   albuterol (VENTOLIN HFA) 108 (90 Base) MCG/ACT  inhaler Inhale 2 puffs into the lungs every 4 (four) hours as needed for wheezing or shortness of breath.      amoxicillin (AMOXIL) 500 MG capsule Take 500 mg by mouth 3 (three) times daily. (Patient not taking: Reported on 09/02/2021)     cyclobenzaprine (FLEXERIL) 10 MG tablet Take 10 mg by mouth 3 (three) times daily. (Patient not taking: Reported on 08/09/2021)     diclofenac (VOLTAREN) 75 MG EC tablet Take 1 tablet (75 mg total) by mouth 2 (two) times daily. (Patient not taking: Reported on 09/02/2021) 180 tablet 1   esomeprazole (NEXIUM) 40 MG capsule Take 1 capsule (40 mg total) by mouth daily. 90 capsule 1   fluticasone (FLONASE) 50 MCG/ACT nasal spray INSTILL 1 SPRAY IN EACH NOSTRIL DAILY ASNEEDED FOR SEASONAL ALLERGY SYMPTOMS 16 g 0   hydrocortisone (ANUSOL-HC) 2.5 % rectal cream Apply 1 application topically as needed for hemorrhoids.      hydrOXYzine (ATARAX) 25 MG tablet Take 25 mg by mouth daily.     hydrOXYzine (ATARAX/VISTARIL) 10 MG tablet Take 10 mg  by mouth 2 (two) times daily.     levothyroxine (SYNTHROID) 88 MCG tablet TAKE 1 TABLET BY MOUTH DAILY 90 tablet 1   metoprolol succinate (TOPROL-XL) 50 MG 24 hr tablet TAKE ONE TABLET BY MOUTH AT BEDTIME 90 tablet 0   mirabegron ER (MYRBETRIQ) 50 MG TB24 tablet Take 1 tablet (50 mg total) by mouth daily. 30 tablet 2   morphine (MS CONTIN) 15 MG 12 hr tablet Take 15 mg by mouth 3 (three) times daily.     MOVANTIK 25 MG TABS tablet Take 25 mg by mouth daily.     nitrofurantoin, macrocrystal-monohydrate, (MACROBID) 100 MG capsule Take 100 mg by mouth 2 (two) times daily.     Olopatadine HCl 0.2 % SOLN Place 1 drop into both eyes at bedtime as needed (allergies).     omega-3 acid ethyl esters (LOVAZA) 1 g capsule TAKE 2 CAPSULES BY MOUTH 2 TIMES DAILY 180 capsule 1   ondansetron (ZOFRAN-ODT) 4 MG disintegrating tablet Take 1 tablet (4 mg total) by mouth every 8 (eight) hours as needed for nausea or vomiting. 20 tablet 0   Oxycodone HCl 10 MG  TABS Take 10 mg by mouth 4 (four) times daily as needed.     oxyCODONE-acetaminophen (PERCOCET) 10-325 MG tablet Take 1 tablet by mouth 2 (two) times daily as needed.     pregabalin (LYRICA) 100 MG capsule Take 100 mg by mouth 3 (three) times daily.     pregabalin (LYRICA) 150 MG capsule Take 150 mg by mouth 3 (three) times daily.     rosuvastatin (CRESTOR) 40 MG tablet TAKE 1 TABLET BY MOUTH DAILY 90 tablet 0   SPIRIVA HANDIHALER 18 MCG inhalation capsule Place 1 capsule (18 mcg total) into inhaler and inhale daily. 90 capsule 1   SYMBICORT 160-4.5 MCG/ACT inhaler INHALE 2 PUFFS BY MOUTH TWICE DAILY (MAINTENANCE INHALER). RINSE MOUTH WELL AFTER USE TO PREVENT THRUSH. 10.2 g 2   venlafaxine XR (EFFEXOR-XR) 150 MG 24 hr capsule Take 1 capsule (150 mg total) by mouth every evening. For mood control 30 capsule 0   ziprasidone (GEODON) 40 MG capsule TAKE 1 CAPSULE BY MOUTH EVERY EVENING WITH FOOD FOR MOOD 90 capsule 0   No current facility-administered medications for this visit.    No Known Allergies  Family History  Problem Relation Age of Onset   Diabetes type II Mother    Hypertension Mother    Diabetes Mother    Diabetes Sister    Hypertension Sister    Hypertension Maternal Grandmother    Diabetes Maternal Grandmother    Breast cancer Neg Hx     Social History   Socioeconomic History   Marital status: Divorced    Spouse name: Not on file   Number of children: Not on file   Years of education: Not on file   Highest education level: Not on file  Occupational History   Not on file  Tobacco Use   Smoking status: Some Days    Packs/day: 1.00    Years: 40.00    Total pack years: 40.00    Types: Cigarettes   Smokeless tobacco: Never  Vaping Use   Vaping Use: Never used  Substance and Sexual Activity   Alcohol use: No   Drug use: No   Sexual activity: Not Currently    Birth control/protection: Surgical  Other Topics Concern   Not on file  Social History Narrative   Not  on file   Social Determinants of Health  Financial Resource Strain: Low Risk  (08/09/2021)   Overall Financial Resource Strain (CARDIA)    Difficulty of Paying Living Expenses: Not hard at all  Food Insecurity: No Food Insecurity (08/09/2021)   Hunger Vital Sign    Worried About Running Out of Food in the Last Year: Never true    Ran Out of Food in the Last Year: Never true  Transportation Needs: No Transportation Needs (08/09/2021)   PRAPARE - Hydrologist (Medical): No    Lack of Transportation (Non-Medical): No  Physical Activity: Unknown (08/09/2021)   Exercise Vital Sign    Days of Exercise per Week: 0 days    Minutes of Exercise per Session: Patient refused  Stress: No Stress Concern Present (08/09/2021)   [place]    Feeling of Stress : Not at all  Social Connections: Moderately Isolated (08/09/2021)   Social Connection and Isolation Panel [NHANES]    Frequency of Communication with Friends and Family: More than three times a week    Frequency of Social Gatherings with Friends and Family: Twice a week    Attends Religious Services: More than 4 times per year    Active Member of [employer] or Organizations: No    Attends Archivist Meetings: Never    Marital Status: Divorced  Human resources officer Violence: Not At Risk (08/09/2021)   Humiliation, Afraid, Rape, and Kick questionnaire    Fear of Current or Ex-Partner: No    Emotionally Abused: No    Physically Abused: No    Sexually Abused: No     Constitutional: Patient reports fatigue, headache.  Denies fever, malaise, or abrupt weight changes.  HEENT: Patient reports runny nose.  Denies eye pain, eye redness, ear pain, ringing in the ears, wax buildup, nasal congestion, bloody nose, or sore throat. Respiratory: Denies difficulty breathing, shortness of breath, cough or sputum production.   Cardiovascular: Denies chest pain, chest  tightness, palpitations or swelling in the hands or feet.  Gastrointestinal: Patient reports constipation.  Denies abdominal pain, bloating, diarrhea or blood in the stool.  GU: Denies urgency, frequency, pain with urination, burning sensation, blood in urine, odor or discharge. Musculoskeletal: Patient reports chronic joint muscle pain, difficulty with gait.  Denies decrease in range of motion, or joint swelling.  Skin: Denies redness, rashes, lesions or ulcercations.  Neurological: Denies dizziness, difficulty with memory, difficulty with speech or problems with balance and coordination.  Psych: Patient has a history of depression.  Denies anxiety, SI/HI.  No other specific complaints in a complete review of systems (except as listed in HPI above).     Objective:   Physical Exam   BP 116/78 (BP Location: Right Arm, Patient Position: Sitting, Cuff Size: Normal)   Pulse 76   Temp (!) 96.8 F (36 C) (Temporal)   Ht 5' 6"  (1.676 m)   Wt 206 lb (93.4 kg)   SpO2 94%   BMI 33.25 kg/m   Wt Readings from Last 3 Encounters:  09/02/21 187 lb 3.2 oz (84.9 kg)  08/09/21 184 lb (83.5 kg)  06/13/21 184 lb (83.5 kg)    General: Appears older than her stated age, obese, chronically ill-appearing, in NAD. Skin: Warm, extremely dry, scaly but intact.  Wound discoloration noted of lower extremities. HEENT: Head: normal shape and size; Eyes: sclera white, no icterus, conjunctiva pink, PERRLA and EOMs intact;  Neck:  Neck supple, trachea midline. No masses, lumps present.  Cardiovascular: Normal  rate and rhythm. S1,S2 noted.  No murmur, rubs or gallops noted. No JVD or BLE edema. No carotid bruits noted. Pulmonary/Chest: Normal effort and positive vesicular breath sounds. No respiratory distress. No wheezes, rales or ronchi noted.  Abdomen: Normal bowel sounds.  Musculoskeletal: Strength 5/5 BUE/BLE.  Gait slow but steady with use of walker. Neurological: Alert and oriented. Cranial nerves II-XII  grossly intact. Coordination normal.  Psychiatric: Mood and affect flat. Behavior is normal. Judgment and thought content normal.    BMET    Component Value Date/Time   NA 141 06/13/2021 1512   NA 140 03/30/2014 2045   K 3.6 06/13/2021 1512   K 3.7 03/30/2014 2045   CL 103 06/13/2021 1512   CL 106 03/30/2014 2045   CO2 30 06/13/2021 1512   CO2 28 03/30/2014 2045   GLUCOSE 95 06/13/2021 1512   GLUCOSE 96 03/30/2014 2045   BUN 6 (L) 06/13/2021 1512   BUN 7 03/30/2014 2045   CREATININE 0.67 06/13/2021 1512   CALCIUM 9.2 06/13/2021 1512   CALCIUM 8.7 03/30/2014 2045   GFRNONAA >60 05/10/2021 1229   GFRNONAA 102 08/08/2020 1124   GFRAA 118 08/08/2020 1124    Lipid Panel     Component Value Date/Time   CHOL 122 06/13/2021 1512   TRIG 119 06/13/2021 1512   HDL 38 (L) 06/13/2021 1512   CHOLHDL 3.2 06/13/2021 1512   LDLCALC 63 06/13/2021 1512    CBC    Component Value Date/Time   WBC 6.8 06/13/2021 1512   RBC 4.90 06/13/2021 1512   HGB 15.3 06/13/2021 1512   HGB 15.5 03/30/2014 2045   HCT 45.7 (H) 06/13/2021 1512   HCT 47.0 03/30/2014 2045   PLT 155 06/13/2021 1512   PLT 174 03/30/2014 2045   MCV 93.3 06/13/2021 1512   MCV 92 03/30/2014 2045   MCH 31.2 06/13/2021 1512   MCHC 33.5 06/13/2021 1512   RDW 11.8 06/13/2021 1512   RDW 15.4 (H) 03/30/2014 2045   LYMPHSABS 1.2 05/10/2021 1229   MONOABS 0.7 05/10/2021 1229   EOSABS 0.0 05/10/2021 1229   BASOSABS 0.0 05/10/2021 1229    Hgb A1C Lab Results  Component Value Date   HGBA1C 5.3 06/13/2021           Assessment & Plan:   Preventative Health Maintenance:  Flu shot today She declines tetanus for financial reasons, advised if she gets bit or cut to get this done Encouraged her to get a COVID-vaccine Discussed Shingrix vaccine, she will check coverage with her insurance company and schedule this at the pharmacy if she would like to have this done She no longer needs Pap smears She declines orders for  mammogram or bone density at this time Colon Screening UTD, will request copy Encouraged her to consume a balanced diet and exercise regimen Advised her to see an eye doctor and dentist annually We will check CBC, c-Met, lipid, and HIV today  RTC in 1 month for lab only TSH, free T4, 6 months, follow-up chronic conditions Webb Silversmith, NP

## 2021-12-17 NOTE — Assessment & Plan Note (Signed)
Encourage diet and exercise for weight loss 

## 2021-12-18 LAB — COMPLETE METABOLIC PANEL WITH GFR
AG Ratio: 1.2 (calc) (ref 1.0–2.5)
ALT: 18 U/L (ref 6–29)
AST: 22 U/L (ref 10–35)
Albumin: 3.8 g/dL (ref 3.6–5.1)
Alkaline phosphatase (APISO): 91 U/L (ref 37–153)
BUN: 8 mg/dL (ref 7–25)
CO2: 28 mmol/L (ref 20–32)
Calcium: 8.9 mg/dL (ref 8.6–10.4)
Chloride: 105 mmol/L (ref 98–110)
Creat: 0.68 mg/dL (ref 0.50–1.05)
Globulin: 3.1 g/dL (calc) (ref 1.9–3.7)
Glucose, Bld: 93 mg/dL (ref 65–99)
Potassium: 4.3 mmol/L (ref 3.5–5.3)
Sodium: 142 mmol/L (ref 135–146)
Total Bilirubin: 0.5 mg/dL (ref 0.2–1.2)
Total Protein: 6.9 g/dL (ref 6.1–8.1)
eGFR: 98 mL/min/{1.73_m2} (ref >=60)

## 2021-12-18 LAB — CBC
HCT: 47.2 % — ABNORMAL HIGH (ref 35.0–45.0)
Hemoglobin: 15.8 g/dL — ABNORMAL HIGH (ref 11.7–15.5)
MCH: 31.5 pg (ref 27.0–33.0)
MCHC: 33.5 g/dL (ref 32.0–36.0)
MCV: 94 fL (ref 80.0–100.0)
MPV: 11.9 fL (ref 7.5–12.5)
Platelets: 157 10*3/uL (ref 140–400)
RBC: 5.02 10*6/uL (ref 3.80–5.10)
RDW: 12.8 % (ref 11.0–15.0)
WBC: 8.9 10*3/uL (ref 3.8–10.8)

## 2021-12-18 LAB — LIPID PANEL
Cholesterol: 129 mg/dL (ref <200)
HDL: 35 mg/dL — ABNORMAL LOW (ref >=50)
LDL Cholesterol (Calc): 69 mg/dL (calc)
Non-HDL Cholesterol (Calc): 94 mg/dL (calc) (ref <130)
Total CHOL/HDL Ratio: 3.7 (calc) (ref <5.0)
Triglycerides: 169 mg/dL — ABNORMAL HIGH (ref <150)

## 2021-12-18 LAB — HIV ANTIBODY (ROUTINE TESTING W REFLEX): HIV 1&2 Ab, 4th Generation: NONREACTIVE

## 2021-12-24 ENCOUNTER — Telehealth: Payer: Self-pay | Admitting: Internal Medicine

## 2021-12-24 NOTE — Telephone Encounter (Signed)
[place] [facility] Sent renewal paper work on 8.28.23 and Vernona Rieger confirmed the office received it they are calling to check on getting this back / please advise

## 2021-12-27 ENCOUNTER — Other Ambulatory Visit: Payer: Self-pay | Admitting: Internal Medicine

## 2021-12-27 DIAGNOSIS — E785 Hyperlipidemia, unspecified: Secondary | ICD-10-CM

## 2021-12-27 DIAGNOSIS — I10 Essential (primary) hypertension: Secondary | ICD-10-CM

## 2021-12-27 NOTE — Telephone Encounter (Signed)
Both the patient and [facility] called for refill request. Patient reports that she is out of the Metoprolol Succinate 50 MG and Rosuvastatin Calcium 40 MG and requests refill asap. Patient stated she has been out of the medications for 3 days now.

## 2022-01-27 ENCOUNTER — Other Ambulatory Visit: Payer: Self-pay | Admitting: Family Medicine

## 2022-01-27 ENCOUNTER — Other Ambulatory Visit: Payer: Self-pay | Admitting: Internal Medicine

## 2022-01-27 DIAGNOSIS — N3281 Overactive bladder: Secondary | ICD-10-CM

## 2022-01-28 NOTE — Telephone Encounter (Signed)
Requested Prescriptions  Pending Prescriptions Disp Refills  . MYRBETRIQ 50 MG TB24 tablet [Pharmacy Med Name: MYRBETRIQ 50 MG TAB] 30 tablet 2    Sig: TAKE 1 TABLET BY MOUTH DAILY     Urology: Bladder Agents - mirabegron Passed - 01/27/2022  4:36 PM      Passed - Cr in normal range and within 360 days    Creat  Date Value Ref Range Status  12/17/2021 0.68 0.50 - 1.05 mg/dL Final         Passed - ALT in normal range and within 360 days    ALT  Date Value Ref Range Status  12/17/2021 18 6 - 29 U/L Final   SGPT (ALT)  Date Value Ref Range Status  03/30/2014 20 U/L Final    Comment:    14-63 NOTE: New Reference Range 11/01/13          Passed - AST in normal range and within 360 days    AST  Date Value Ref Range Status  12/17/2021 22 10 - 35 U/L Final   SGOT(AST)  Date Value Ref Range Status  03/30/2014 18 15 - 37 Unit/L Final         Passed - eGFR is 15 or above and within 360 days    GFR, Est African American  Date Value Ref Range Status  08/08/2020 118 > OR = 60 mL/min/1.58m Final   GFR, Est Non African American  Date Value Ref Range Status  08/08/2020 102 > OR = 60 mL/min/1.752mFinal   GFR, Estimated  Date Value Ref Range Status  05/10/2021 >60 >60 mL/min Final    Comment:    (NOTE) Calculated using the CKD-EPI Creatinine Equation (2021)    eGFR  Date Value Ref Range Status  12/17/2021 98 > OR = 60 mL/min/1.7324minal         Passed - Last BP in normal range    BP Readings from Last 1 Encounters:  12/17/21 116/78         Passed - Valid encounter within last 12 months    Recent Outpatient Visits          1 month ago Need for influenza vaccination   [facility]   4 months ago OAB (overactive bladder)   [facility]   7 months ago [facility] hypothyroidism   [facility]   1 year ago Acute cystitis with hematuria   [facility]   1 year ago Chronic midline low back pain with right-sided sciatica   [facility]

## 2022-01-28 NOTE — Telephone Encounter (Signed)
Requested Prescriptions  Pending Prescriptions Disp Refills  . SYMBICORT 160-4.5 MCG/ACT inhaler [Pharmacy Med Name: SYMBICORT 160-4.5 MCG/ACT INH AERO] 10.2 g 2    Sig: INHALE 2 PUFFS BY MOUTH TWICE DAILY (MAINTENANCE INHALER). RINSE MOUTH WELL AFTER USE TO PREVENT THRUSH.     Pulmonology:  Combination Products Passed - 01/27/2022  4:37 PM      Passed - Valid encounter within last 12 months    Recent Outpatient Visits          1 month ago Need for influenza vaccination   [facility] [place], Coralie Keens, NP   4 months ago OAB (overactive bladder)   [place], DO   7 months ago Acquired hypothyroidism   [facility] [place], Coralie Keens, NP   1 year ago Acute cystitis with hematuria   [place], DO   1 year ago Chronic midline low back pain with right-sided sciatica   [place], [place]

## 2022-02-27 DIAGNOSIS — F17210 Nicotine dependence, cigarettes, uncomplicated: Secondary | ICD-10-CM | POA: Diagnosis not present

## 2022-02-27 DIAGNOSIS — Z79891 Long term (current) use of opiate analgesic: Secondary | ICD-10-CM | POA: Diagnosis not present

## 2022-02-27 DIAGNOSIS — G894 Chronic pain syndrome: Secondary | ICD-10-CM | POA: Diagnosis not present

## 2022-03-14 ENCOUNTER — Emergency Department: Payer: Medicare Other

## 2022-03-14 ENCOUNTER — Inpatient Hospital Stay
Admission: EM | Admit: 2022-03-14 | Discharge: 2022-03-17 | DRG: 189 | Disposition: A | Discharge status: Home / Self Care | Payer: Medicare Other | Attending: Internal Medicine | Admitting: Internal Medicine

## 2022-03-14 ENCOUNTER — Observation Stay: Payer: Medicare Other

## 2022-03-14 ENCOUNTER — Other Ambulatory Visit: Payer: Self-pay

## 2022-03-14 DIAGNOSIS — F32A Depression, unspecified: Secondary | ICD-10-CM | POA: Diagnosis not present

## 2022-03-14 DIAGNOSIS — F319 Bipolar disorder, unspecified: Secondary | ICD-10-CM | POA: Diagnosis present

## 2022-03-14 DIAGNOSIS — R062 Wheezing: Secondary | ICD-10-CM | POA: Diagnosis not present

## 2022-03-14 DIAGNOSIS — I7300 Raynaud's syndrome without gangrene: Secondary | ICD-10-CM | POA: Diagnosis not present

## 2022-03-14 DIAGNOSIS — R0902 Hypoxemia: Secondary | ICD-10-CM | POA: Diagnosis not present

## 2022-03-14 DIAGNOSIS — F17210 Nicotine dependence, cigarettes, uncomplicated: Secondary | ICD-10-CM | POA: Diagnosis present

## 2022-03-14 DIAGNOSIS — R509 Fever, unspecified: Secondary | ICD-10-CM | POA: Diagnosis not present

## 2022-03-14 DIAGNOSIS — R072 Precordial pain: Secondary | ICD-10-CM | POA: Diagnosis not present

## 2022-03-14 DIAGNOSIS — J449 Chronic obstructive pulmonary disease, unspecified: Secondary | ICD-10-CM | POA: Insufficient documentation

## 2022-03-14 DIAGNOSIS — E039 Hypothyroidism, unspecified: Secondary | ICD-10-CM | POA: Diagnosis not present

## 2022-03-14 DIAGNOSIS — E872 Acidosis, unspecified: Secondary | ICD-10-CM | POA: Diagnosis not present

## 2022-03-14 DIAGNOSIS — Z90710 Acquired absence of both cervix and uterus: Secondary | ICD-10-CM | POA: Diagnosis not present

## 2022-03-14 DIAGNOSIS — R0602 Shortness of breath: Secondary | ICD-10-CM | POA: Diagnosis not present

## 2022-03-14 DIAGNOSIS — Z8249 Family history of ischemic heart disease and other diseases of the circulatory system: Secondary | ICD-10-CM

## 2022-03-14 DIAGNOSIS — Z6833 Body mass index (BMI) 33.0-33.9, adult: Secondary | ICD-10-CM | POA: Diagnosis not present

## 2022-03-14 DIAGNOSIS — E876 Hypokalemia: Secondary | ICD-10-CM | POA: Diagnosis not present

## 2022-03-14 DIAGNOSIS — Z8673 Personal history of transient ischemic attack (TIA), and cerebral infarction without residual deficits: Secondary | ICD-10-CM

## 2022-03-14 DIAGNOSIS — J9601 Acute respiratory failure with hypoxia: Secondary | ICD-10-CM | POA: Diagnosis not present

## 2022-03-14 DIAGNOSIS — E669 Obesity, unspecified: Secondary | ICD-10-CM | POA: Diagnosis present

## 2022-03-14 DIAGNOSIS — Z79891 Long term (current) use of opiate analgesic: Secondary | ICD-10-CM

## 2022-03-14 DIAGNOSIS — J441 Chronic obstructive pulmonary disease with (acute) exacerbation: Secondary | ICD-10-CM | POA: Diagnosis present

## 2022-03-14 DIAGNOSIS — Z79899 Other long term (current) drug therapy: Secondary | ICD-10-CM

## 2022-03-14 DIAGNOSIS — Z833 Family history of diabetes mellitus: Secondary | ICD-10-CM

## 2022-03-14 DIAGNOSIS — Z9049 Acquired absence of other specified parts of digestive tract: Secondary | ICD-10-CM

## 2022-03-14 DIAGNOSIS — Z1152 Encounter for screening for COVID-19: Secondary | ICD-10-CM | POA: Diagnosis not present

## 2022-03-14 DIAGNOSIS — M797 Fibromyalgia: Secondary | ICD-10-CM | POA: Diagnosis not present

## 2022-03-14 DIAGNOSIS — J9690 Respiratory failure, unspecified, unspecified whether with hypoxia or hypercapnia: Secondary | ICD-10-CM | POA: Diagnosis present

## 2022-03-14 DIAGNOSIS — Z743 Need for continuous supervision: Secondary | ICD-10-CM | POA: Diagnosis not present

## 2022-03-14 DIAGNOSIS — E785 Hyperlipidemia, unspecified: Secondary | ICD-10-CM | POA: Diagnosis present

## 2022-03-14 DIAGNOSIS — I10 Essential (primary) hypertension: Secondary | ICD-10-CM | POA: Diagnosis present

## 2022-03-14 DIAGNOSIS — E8809 Other disorders of plasma-protein metabolism, not elsewhere classified: Secondary | ICD-10-CM | POA: Insufficient documentation

## 2022-03-14 DIAGNOSIS — G4489 Other headache syndrome: Secondary | ICD-10-CM | POA: Diagnosis not present

## 2022-03-14 DIAGNOSIS — G8929 Other chronic pain: Secondary | ICD-10-CM | POA: Diagnosis not present

## 2022-03-14 DIAGNOSIS — F17200 Nicotine dependence, unspecified, uncomplicated: Secondary | ICD-10-CM | POA: Diagnosis not present

## 2022-03-14 DIAGNOSIS — Z79890 Hormone replacement therapy (postmenopausal): Secondary | ICD-10-CM

## 2022-03-14 DIAGNOSIS — F419 Anxiety disorder, unspecified: Secondary | ICD-10-CM | POA: Diagnosis present

## 2022-03-14 DIAGNOSIS — D696 Thrombocytopenia, unspecified: Secondary | ICD-10-CM | POA: Diagnosis not present

## 2022-03-14 DIAGNOSIS — I499 Cardiac arrhythmia, unspecified: Secondary | ICD-10-CM | POA: Diagnosis not present

## 2022-03-14 DIAGNOSIS — K219 Gastro-esophageal reflux disease without esophagitis: Secondary | ICD-10-CM | POA: Diagnosis not present

## 2022-03-14 DIAGNOSIS — R059 Cough, unspecified: Secondary | ICD-10-CM | POA: Diagnosis not present

## 2022-03-14 LAB — COMPREHENSIVE METABOLIC PANEL
ALT: 34 U/L (ref 0–44)
AST: 52 U/L — ABNORMAL HIGH (ref 15–41)
Albumin: 3.4 g/dL — ABNORMAL LOW (ref 3.5–5.0)
Alkaline Phosphatase: 85 U/L (ref 38–126)
Anion gap: 12 (ref 5–15)
BUN: 9 mg/dL (ref 8–23)
CO2: 27 mmol/L (ref 22–32)
Calcium: 8.7 mg/dL — ABNORMAL LOW (ref 8.9–10.3)
Chloride: 98 mmol/L (ref 98–111)
Creatinine, Ser: 0.64 mg/dL (ref 0.44–1.00)
GFR, Estimated: >60 mL/min (ref >60)
Glucose, Bld: 153 mg/dL — ABNORMAL HIGH (ref 70–99)
Potassium: 3 mmol/L — ABNORMAL LOW (ref 3.5–5.1)
Sodium: 137 mmol/L (ref 135–145)
Total Bilirubin: 1.6 mg/dL — ABNORMAL HIGH (ref 0.3–1.2)
Total Protein: 7.8 g/dL (ref 6.5–8.1)

## 2022-03-14 LAB — URINALYSIS, ROUTINE W REFLEX MICROSCOPIC
Bacteria, UA: NONE SEEN
Bilirubin Urine: NEGATIVE
Glucose, UA: NEGATIVE mg/dL
Ketones, ur: 5 mg/dL — AB
Leukocytes,Ua: NEGATIVE
Nitrite: NEGATIVE
Protein, ur: 30 mg/dL — AB
Specific Gravity, Urine: 1.015 (ref 1.005–1.030)
pH: 5 (ref 5.0–8.0)

## 2022-03-14 LAB — D-DIMER, QUANTITATIVE: D-Dimer, Quant: 1.55 ug/mL-FEU — ABNORMAL HIGH (ref 0.00–0.50)

## 2022-03-14 LAB — CBC WITH DIFFERENTIAL/PLATELET
Abs Immature Granulocytes: 0.02 10*3/uL (ref 0.00–0.07)
Basophils Absolute: 0 10*3/uL (ref 0.0–0.1)
Basophils Relative: 0 %
Eosinophils Absolute: 0 10*3/uL (ref 0.0–0.5)
Eosinophils Relative: 0 %
HCT: 49.6 % — ABNORMAL HIGH (ref 36.0–46.0)
Hemoglobin: 16.8 g/dL — ABNORMAL HIGH (ref 12.0–15.0)
Immature Granulocytes: 0 %
Lymphocytes Relative: 4 %
Lymphs Abs: 0.3 10*3/uL — ABNORMAL LOW (ref 0.7–4.0)
MCH: 31 pg (ref 26.0–34.0)
MCHC: 33.9 g/dL (ref 30.0–36.0)
MCV: 91.5 fL (ref 80.0–100.0)
Monocytes Absolute: 0.6 10*3/uL (ref 0.1–1.0)
Monocytes Relative: 8 %
Neutro Abs: 6.9 10*3/uL (ref 1.7–7.7)
Neutrophils Relative %: 88 %
Platelets: 127 10*3/uL — ABNORMAL LOW (ref 150–400)
RBC: 5.42 MIL/uL — ABNORMAL HIGH (ref 3.87–5.11)
RDW: 13.6 % (ref 11.5–15.5)
WBC: 7.9 10*3/uL (ref 4.0–10.5)
nRBC: 0 % (ref 0.0–0.2)

## 2022-03-14 LAB — RESP PANEL BY RT-PCR (FLU A&B, COVID) ARPGX2
Influenza A by PCR: NEGATIVE
Influenza B by PCR: NEGATIVE
SARS Coronavirus 2 by RT PCR: NEGATIVE

## 2022-03-14 LAB — PROCALCITONIN: Procalcitonin: 0.13 ng/mL

## 2022-03-14 LAB — LACTIC ACID, PLASMA
Lactic Acid, Venous: 4.1 mmol/L (ref 0.5–1.9)
Lactic Acid, Venous: 2.9 mmol/L (ref 0.5–1.9)

## 2022-03-14 MED ORDER — LACTATED RINGERS IV BOLUS (SEPSIS)
1000.0000 mL | Freq: Once | INTRAVENOUS | Status: AC
  Administered 2022-03-14: 1000 mL via INTRAVENOUS

## 2022-03-14 MED ORDER — ACETAMINOPHEN 500 MG PO TABS
1000.0000 mg | ORAL_TABLET | Freq: Once | ORAL | Status: AC
  Administered 2022-03-14: 1000 mg via ORAL
  Filled 2022-03-14: qty 2

## 2022-03-14 MED ORDER — POTASSIUM CHLORIDE 20 MEQ PO PACK
40.0000 meq | PACK | Freq: Once | ORAL | Status: AC
  Administered 2022-03-14: 40 meq via ORAL
  Filled 2022-03-14: qty 2

## 2022-03-14 MED ORDER — METHYLPREDNISOLONE SODIUM SUCC 125 MG IJ SOLR: 125.0000 mg | Freq: Once | INTRAMUSCULAR | Status: DC

## 2022-03-14 MED ORDER — IPRATROPIUM-ALBUTEROL 0.5-2.5 (3) MG/3ML IN SOLN
3.0000 mL | RESPIRATORY_TRACT | Status: AC
  Administered 2022-03-14 (×3): 3 mL via RESPIRATORY_TRACT
  Filled 2022-03-14: qty 6
  Filled 2022-03-14: qty 3

## 2022-03-14 MED ORDER — SODIUM CHLORIDE 0.9 % IV SOLN
2.0000 g | INTRAVENOUS | Status: DC
  Administered 2022-03-14: 2 g via INTRAVENOUS
  Filled 2022-03-14: qty 20

## 2022-03-14 MED ORDER — SODIUM CHLORIDE 0.9 % IV BOLUS: 1000.0000 mL | Freq: Once | INTRAVENOUS | Status: DC

## 2022-03-14 MED ORDER — IOHEXOL 350 MG/ML SOLN
75.0000 mL | Freq: Once | INTRAVENOUS | Status: AC | PRN
  Administered 2022-03-14: 75 mL via INTRAVENOUS

## 2022-03-14 MED ORDER — SODIUM CHLORIDE 0.9 % IV SOLN
500.0000 mg | INTRAVENOUS | Status: DC
  Administered 2022-03-14 – 2022-03-16 (×3): 500 mg via INTRAVENOUS
  Filled 2022-03-14: qty 500
  Filled 2022-03-14 (×4): qty 5

## 2022-03-14 NOTE — ED Triage Notes (Addendum)
Patient reports she has been sick for three days with cough, sob and fever.  Reports white milky sputum.  Initially on 2L [place] with ems and low 80's increased to 4L [place] mid 80's  increased to 6L [place] maintaining 93% on 6L [place].  Complains of headache. EMS did give 2 duonebs and 125mg  solumedrol.

## 2022-03-14 NOTE — Assessment & Plan Note (Signed)
-  Continue synthroid -Check TSH in the setting of tachycardia

## 2022-03-14 NOTE — Assessment & Plan Note (Signed)
-  K+ ordered -Trend in the AM with magnesium

## 2022-03-14 NOTE — Sepsis Progress Note (Signed)
Following per sepsis protocol   

## 2022-03-14 NOTE — ED Provider Notes (Addendum)
[facility] Provider Note    Event Date/Time   First MD Initiated Contact with Patient 03/14/22 April Soto     (approximate)   History   Shortness of Breath   HPI  April Soto is a 61 y.o. female with past medical history significant for COPD, who presents to the emergency department shortness of breath.  Patient endorsed this shortness of breath that started yesterday.  Endorses productive cough and significant shortness of breath.  Complaining of headache.  Fever at home up to 101.  Denies prior hospitalizations or prior need of oxygen.  No longer with tobacco use.  No prior history of DVT or PE.  Denies any chest pain.  Denies any abdominal pain.  Does endorse nausea but no episodes of vomiting.  Denies any significant diarrhea.  No significant dysuria, urinary urgency or frequency.  No falls or trauma.  No recent hospitalizations.     Physical Exam   Triage Vital Signs: ED Triage Vitals  Enc Vitals Group     BP 03/14/22 1808 132/89     Pulse Rate 03/14/22 1808 (!) 123     Resp 03/14/22 1808 18     Temp 03/14/22 1808 99 F (37.2 C)     Temp Source 03/14/22 1808 Oral     SpO2 03/14/22 1808 (!) 84 %     Weight 03/14/22 1814 206 lb (93.4 kg)     Height 03/14/22 1814 5\' 6"  (1.676 m)     Head Circumference --      Peak Flow --      Pain Score 03/14/22 1814 10     Pain Loc --      Pain Edu? --      Excl. in GC? --     Most recent vital signs: Vitals:   03/14/22 1808 03/14/22 1809  BP: 132/89   Pulse: (!) 123   Resp: 18 20  Temp: 99 F (37.2 C)   SpO2: (!) 84%     Physical Exam Constitutional:      General: She is in acute distress.     Appearance: She is well-developed. She is ill-appearing.  HENT:     Head: Atraumatic.  Eyes:     Conjunctiva/sclera: Conjunctivae normal.  Cardiovascular:     Rate and Rhythm: Regular rhythm.  Pulmonary:     Effort: Tachypnea and accessory muscle usage present.     Breath sounds: Wheezing (Diffuse  inspiratory and expiratory wheezing throughout all lung fields) present.     Comments: 6 L nasal cannula, hypoxic on room air to 80s Abdominal:     General: There is no distension.  Musculoskeletal:        General: Normal range of motion.     Cervical back: Normal range of motion.  Skin:    General: Skin is warm.  Neurological:     Mental Status: She is alert. Mental status is at baseline.          IMPRESSION / MDM / ASSESSMENT AND PLAN / ED COURSE  I reviewed the triage vital signs and the nursing notes.  Arrival to the emergency department patient with significant respiratory distress.  Found to be hypoxic and placed on 6 L nasal cannula.  Afebrile but tachycardic.  Differential diagnosis including COPD exacerbation, pneumonia, COVID/influenza, pulmonary embolism, dehydration    EKG  I, 14/01/23, the attending physician, personally viewed and interpreted this ECG.   Rate: Sinus cardia with a rate in the 120s  Rhythm: Sinus tachycardia  Axis: Normal  Intervals: Normal  ST&T Change: None  Sinus tachycardia while on cardiac telemetry RADIOLOGY I independently reviewed imaging, my interpretation of imaging: Chest x-ray with no focal findings consistent with pneumonia.  Read as no acute findings      ED Results / Procedures / Treatments   Labs (all labs ordered are listed, but only abnormal results are displayed) Labs interpreted as -  Lactic acid significantly elevated at 4.1.  Labs Reviewed  LACTIC ACID, PLASMA - Abnormal; Notable for the following components:      Result Value   Lactic Acid, Venous 4.1 (*)    All other components within normal limits  COMPREHENSIVE METABOLIC PANEL - Abnormal; Notable for the following components:   Potassium 3.0 (*)    Glucose, Bld 153 (*)    Calcium 8.7 (*)    Albumin 3.4 (*)    AST 52 (*)    Total Bilirubin 1.6 (*)    All other components within normal limits  CBC WITH DIFFERENTIAL/PLATELET - Abnormal; Notable  for the following components:   RBC 5.42 (*)    Hemoglobin 16.8 (*)    HCT 49.6 (*)    Platelets 127 (*)    Lymphs Abs 0.3 (*)    All other components within normal limits  RESP PANEL BY RT-PCR (FLU A&B, COVID) ARPGX2  CULTURE, BLOOD (ROUTINE X 2)  CULTURE, BLOOD (ROUTINE X 2)  LACTIC ACID, PLASMA  URINALYSIS, ROUTINE W REFLEX MICROSCOPIC  D-DIMER, QUANTITATIVE    Given initially elevated lactic acid patient started on 30 cc/kg of IV fluids.  Given 1 L of IV fluids and reevaluated.  No focal findings consistent with pneumonia but given her acute hypoxia and significantly elevated lactic acid with productive cough started on antibiotics to cover for community-acquired pneumonia.  D-dimer added onto stratify for possible pulmonary embolism.  Patient received DuoNebs and IV Solu-Medrol.  Given another round of DuoNebs given her significant wheezing on exam.  Consulted hospitalist for acute hypoxic respiratory failure in the setting of COPD exacerbation.   PROCEDURES:  Critical Care performed: Yes  .Critical Care  Performed by: Corena Herter, MD Authorized by: Corena Herter, MD   Critical care provider statement:    Critical care time (minutes):  45   Critical care time was exclusive of:  Separately billable procedures and treating other patients   Critical care was necessary to treat or prevent imminent or life-threatening deterioration of the following conditions:  Respiratory failure   Critical care was time spent personally by me on the following activities:  Development of treatment plan with patient or surrogate, discussions with consultants, evaluation of patient's response to treatment, examination of patient, ordering and review of laboratory studies, ordering and review of radiographic studies, ordering and performing treatments and interventions, pulse oximetry, re-evaluation of patient's condition and review of old charts   Patient's presentation is most consistent with  acute presentation with potential threat to life or bodily function.   MEDICATIONS ORDERED IN ED: Medications  acetaminophen (TYLENOL) tablet 1,000 mg (has no administration in time range)  ipratropium-albuterol (DUONEB) 0.5-2.5 (3) MG/3ML nebulizer solution 3 mL (has no administration in time range)  lactated ringers bolus 1,000 mL (has no administration in time range)    And  lactated ringers bolus 1,000 mL (has no administration in time range)    And  lactated ringers bolus 1,000 mL (has no administration in time range)  cefTRIAXone (ROCEPHIN) 2 g in sodium chloride 0.9 %  100 mL IVPB (has no administration in time range)  azithromycin (ZITHROMAX) 500 mg in sodium chloride 0.9 % 250 mL IVPB (has no administration in time range)    FINAL CLINICAL IMPRESSION(S) / ED DIAGNOSES   Final diagnoses:  Hypoxia  COPD exacerbation (HCC)  Shortness of breath     Rx / DC Orders   ED Discharge Orders     None        Note:  This document was prepared using Dragon voice recognition software and may include unintentional dictation errors.   Corena Herter, MD 03/14/22 Willaim Sheng    Corena Herter, MD 03/14/22 2020

## 2022-03-14 NOTE — Assessment & Plan Note (Signed)
-  albumin 3.4 -Encourage nutrient dense food choices

## 2022-03-14 NOTE — Assessment & Plan Note (Signed)
-  Continue duonebs, xoponex, dulera, spiriva -Continue systemic steroids -Continue to monitor

## 2022-03-14 NOTE — Assessment & Plan Note (Signed)
-  No O2 at home per pt report -Requiring [facility] here -D-Dimer pending -COPD exacerbation  -Continue scheduled Duo-Nebs -PRN xoponex in the setting of tachycardia -CXR = no active disease --> prior to adequate hydration -No leukocytosis -Wean off O2 as tolerated -Continue to monitor

## 2022-03-14 NOTE — Assessment & Plan Note (Signed)
Continue PPI ?

## 2022-03-14 NOTE — Assessment & Plan Note (Signed)
-  Lactic 4.1 -Likely secondary to hypoxia, sepsis is not ruled out as of yet -started on rocephin and zithromax -Procal pending -Continue to monitor

## 2022-03-14 NOTE — Progress Notes (Signed)
D-Dimer elevated.  CTA chest ordered.

## 2022-03-14 NOTE — H&P (Signed)
History and Physical    Patient: April Soto HER:[id number] DOB: [birth year] DOA: 03/14/2022 DOS: the patient was seen and examined on 03/14/2022 PCP: Lorre Munroe, NP  Patient coming via: EMS  Chief Complaint:  Chief Complaint  Patient presents with   Shortness of Breath   HPI: April Soto is a 61 y.o. female with medical history significant of anxiety, bipolar 1 disorder, depression, asthma, GERD, heart murmur, hypothyroidism, hypertension, hyperlipidemia, stroke, and more presents the ED with a chief complaint of dyspnea.  Patient reports that she has had dyspnea, fatigue, general malaise for 3 days.  She had a cough productive of milky colored sputum.  She had associated headache over her center forehead and right eye.  She does not have a history of headaches or migraines per her report.  She has taken Tylenol for this headache at home, and it has not been helpful.  She has associated chest pain.  She describes it as being substernal chest pain that feels like acid burning.  She had palpitations with exertion.  Patient also admits to dyspnea that is worse on exertion.  She has had dizziness as well.  Patient does not wear oxygen at home.  Patient has no other complaints at this time.  Patient openly admits that she cannot always remember details/symptoms she has been having at home.  Patient smokes 1 pack/day.  She reports that she has not smoked in 3 days and has not been having cravings.  No nicotine patch ordered at this time.  She does not drink, does not use illicit drugs.  She has been vaccinated for COVID.  Patient is full code. Review of Systems: As mentioned in the history of present illness. All other systems reviewed and are negative. Past Medical History:  Diagnosis Date   Anemia    "years ago"   Anxiety    takes Ativan daily   Asthma    Bipolar 1 disorder (HCC)    Chronic back pain    HNP   Constipation    takes Ra Col Rite daily   Degenerative disc disease     Depression    was on Prozac but taken off by MD for a couple of weeks   Depression    Fibromyalgia    takes Lyrica daily   Genital herpes    GERD (gastroesophageal reflux disease)    takes Nexium daily   Headache(784.0)    Heart murmur    "grew out of"   History of bronchitis    last time 15+yrs ago   History of rheumatic fever    Hyperlipidemia    takes Simvastatin daily   Hypertension    takes Coreg daily   Hypothyroidism    takes Synthroid daily   Joint pain    Muscle spasm    takes Flexeril daily as needed   Nocturia    Osteoarthritis    Peripheral edema    takes HCTZ daily   Pneumonia    hx of;last time 30+yrs ago   Raynaud's disease    Rheumatic fever    as a child   Seasonal allergies    takes Zyrtec daily and uses Dymista as well   Stroke (HCC)    ? 5 years ago. No lasting deficits   Urinary incontinence    Weakness    nmbness and tingling-right leg   Past Surgical History:  Procedure Laterality Date   ABDOMINAL HYSTERECTOMY     BACK SURGERY  CHOLECYSTECTOMY     COLONOSCOPY     ESOPHAGOGASTRODUODENOSCOPY     FOOT SURGERY Right 12/2013   ankle, heel and top of foot   LUMBAR LAMINECTOMY/DECOMPRESSION MICRODISCECTOMY Right 01/14/2013   Procedure: RIGHT LUMBAR FOUR FIVE  LAMINECTOMY/DECOMPRESSION MICRODISCECTOMY 1 LEVEL;  Surgeon: Reinaldo Meekerandy O Kritzer, MD;  Location: [facility];  Service: Neurosurgery;  Laterality: Right;  RIGHT L45 microdiskectomy   OTHER SURGICAL HISTORY     Gastric stapling for weight loss. patient reports done in 1980's   SEPTOPLASTY     TONSILLECTOMY     TUBAL LIGATION     Social History:  reports that she has been smoking cigarettes. She has a 40.00 pack-year smoking history. She has never used smokeless tobacco. She reports that she does not drink alcohol and does not use drugs.  No Known Allergies  Family History  Problem Relation Age of Onset   Diabetes type II Mother    Hypertension Mother    Diabetes Mother    Diabetes  Sister    Hypertension Sister    Hypertension Maternal Grandmother    Diabetes Maternal Grandmother    Breast cancer Neg Hx     Prior to Admission medications   Medication Sig Start Date End Date Taking? Authorizing Provider  acyclovir (ZOVIRAX) 400 MG tablet TAKE 1 TABLET BY MOUTH DAILY 10/24/21  Yes Baity, Salvadore Oxfordegina W, NP  cetirizine (ZYRTEC) 10 MG tablet Take 1 tablet (10 mg total) by mouth daily. 12/17/21  Yes Lorre MunroeBaity, Regina W, NP  esomeprazole (NEXIUM) 40 MG capsule Take 1 capsule (40 mg total) by mouth daily. 06/13/21  Yes Lorre MunroeBaity, Regina W, NP  hydrOXYzine (ATARAX) 25 MG tablet Take 25 mg by mouth 2 (two) times daily. 01/27/22  Yes [provider]  metoprolol succinate (TOPROL-XL) 50 MG 24 hr tablet TAKE ONE TABLET BY MOUTH AT BEDTIME 12/27/21  Yes Baity, Salvadore Oxfordegina W, NP  morphine (MS CONTIN) 15 MG 12 hr tablet Take 15 mg by mouth 3 (three) times daily. 02/14/20  Yes [provider]  MOVANTIK 25 MG TABS tablet Take 25 mg by mouth daily. 07/19/20  Yes [provider]  MYRBETRIQ 50 MG TB24 tablet TAKE 1 TABLET BY MOUTH DAILY 01/28/22  Yes Baity, Salvadore Oxfordegina W, NP  omega-3 acid ethyl esters (LOVAZA) 1 g capsule TAKE 2 CAPSULES BY MOUTH 2 TIMES DAILY 09/24/21  Yes Baity, Salvadore Oxfordegina W, NP  pregabalin (LYRICA) 100 MG capsule Take 100 mg by mouth 3 (three) times daily. 04/16/21  Yes [provider]  rosuvastatin (CRESTOR) 40 MG tablet TAKE 1 TABLET BY MOUTH DAILY 12/27/21  Yes Baity, Salvadore Oxfordegina W, NP  SYMBICORT 160-4.5 MCG/ACT inhaler INHALE 2 PUFFS BY MOUTH TWICE DAILY (MAINTENANCE INHALER). RINSE MOUTH WELL AFTER USE TO PREVENT THRUSH. 01/28/22  Yes Lorre MunroeBaity, Regina W, NP  SYNTHROID 88 MCG tablet Take 1 tablet (88 mcg total) by mouth daily before breakfast. 12/17/21  Yes Baity, Salvadore Oxfordegina W, NP  venlafaxine XR (EFFEXOR-XR) 150 MG 24 hr capsule Take 1 capsule (150 mg total) by mouth every evening. For mood control 08/21/17  Yes Money, Feliz Beamravis B, FNP  ziprasidone (GEODON) 40 MG capsule TAKE 1 CAPSULE BY  MOUTH EVERY EVENING WITH FOOD FOR MOOD 10/01/20  Yes Baity, Salvadore Oxfordegina W, NP  albuterol (VENTOLIN HFA) 108 (90 Base) MCG/ACT inhaler Inhale 2 puffs into the lungs every 4 (four) hours as needed for wheezing or shortness of breath.     [provider]  cyclobenzaprine (FLEXERIL) 10 MG tablet Take 10 mg by  mouth 3 (three) times daily. Patient not taking: Reported on 08/09/2021 03/29/20   [provider]  diclofenac (VOLTAREN) 75 MG EC tablet Take 1 tablet (75 mg total) by mouth 2 (two) times daily. Patient not taking: Reported on 09/02/2021 06/13/21   Lorre Munroe, NP  fluticasone [facility]) 50 MCG/ACT nasal spray INSTILL 1 SPRAY IN EACH NOSTRIL DAILY ASNEEDED FOR SEASONAL ALLERGY SYMPTOMS 11/26/21   Lorre Munroe, NP  hydrocortisone (ANUSOL-HC) 2.5 % rectal cream Apply 1 application topically as needed for hemorrhoids.     [provider]  nitrofurantoin, macrocrystal-monohydrate, (MACROBID) 100 MG capsule Take 100 mg by mouth 2 (two) times daily. Patient not taking: Reported on 03/14/2022 03/17/21   [provider]  Olopatadine HCl 0.2 % SOLN Place 1 drop into both eyes at bedtime as needed (allergies).    [provider]  ondansetron (ZOFRAN-ODT) 4 MG disintegrating tablet Take 1 tablet (4 mg total) by mouth every 8 (eight) hours as needed for nausea or vomiting. 05/10/21   Sharman Cheek, MD  Oxycodone HCl 10 MG TABS Take 10 mg by mouth 4 (four) times daily as needed. 06/01/21   [provider]  oxyCODONE-acetaminophen (PERCOCET) 10-325 MG tablet Take 1 tablet by mouth 2 (two) times daily as needed. Patient not taking: Reported on 03/14/2022 05/02/21   [provider]  pregabalin (LYRICA) 150 MG capsule Take 150 mg by mouth 3 (three) times daily. Patient not taking: Reported on 03/14/2022    [provider]  SPIRIVA HANDIHALER 18 MCG inhalation capsule Place 1 capsule (18 mcg total) into inhaler and inhale daily. Patient not taking:  Reported on 03/14/2022 06/13/21   Lorre Munroe, NP    Physical Exam: Vitals:   03/14/22 1809 03/14/22 1814 03/14/22 2000 03/14/22 2021  BP:   128/82   Pulse:   (!) 109   Resp: 20  14   Temp:    99.8 F (37.7 C)  TempSrc:    Rectal  SpO2:   98%   Weight:  93.4 kg    Height:  5\' 6"  (1.676 m)     1.  General: Patient lying supine in bed,  no acute distress   2. Psychiatric: Alert and oriented x 3, mood and behavior normal for situation, pleasant and cooperative with exam   3. Neurologic: Speech and language are normal, face is symmetric, moves all 4 extremities voluntarily, at baseline without acute deficits on limited exam   4. HEENMT:  Head is atraumatic, normocephalic, pupils reactive to light, neck is supple, trachea is midline, mucous membranes are moist   5. Respiratory : Lungs are clear to auscultation bilaterally without wheezing, rhonchi, rales, no cyanosis, no increase in work of breathing or accessory muscle use   6. Cardiovascular : Heart rate normal, rhythm is regular, heart murmur present, rubs or gallops, no peripheral edema, venous stasis changes in the lower extremities bilaterally   7. Gastrointestinal:  Abdomen is soft, nondistended, nontender to palpation bowel sounds active, no masses or organomegaly palpated   8. Skin:  Skin is warm, dry and intact without rashes, acute lesions, or ulcers on limited exam   9.Musculoskeletal:  No acute deformities or trauma, no asymmetry in tone, no peripheral edema, peripheral pulses palpated, no tenderness to palpation in the extremities  Data Reviewed: In the ED Temp 99, heart rate 123, respiratory rate 18-20, blood pressure 132/89, satting as low as 84% on 4 L nasal cannula, maintaining oxygen sats on 6 L nasal cannula  No leukocytosis with a white blood cell count of 7.9, hemoglobin 16.8 Chemistry reveals a hypokalemia at 3.0 Albumin 3.4 Lactic acid 4.1, repeat pending Patient received 3 L bolus Negative COVID  and flu Chest x-ray shows no active disease EKG reads A-fib, but it is indeed sinus tachycardia and the rate is 124 Patient was started on Rocephin and Zithromax Admission requested for acute respiratory failure with hypoxia  Assessment and Plan: * Acute respiratory failure with hypoxia (HCC) -No O2 at home per pt report -Requiring [facility] here -D-Dimer pending -COPD exacerbation  -Continue scheduled Duo-Nebs -PRN xoponex in the setting of tachycardia -CXR = no active disease --> prior to adequate hydration -No leukocytosis -Wean off O2 as tolerated -Continue to monitor  Hypoalbuminemia -albumin 3.4 -Encourage nutrient dense food choices  Lactic acidosis -Lactic 4.1 -Likely secondary to hypoxia, sepsis is not ruled out as of yet -started on rocephin and zithromax -Procal pending -Continue to monitor  COPD with acute exacerbation (HCC) -Continue duonebs, xoponex, dulera, spiriva -Continue systemic steroids -Continue to monitor  Acquired hypothyroidism -Continue synthroid -Check TSH in the setting of tachycardia  GERD (gastroesophageal reflux disease) -Continue PPI  Hypokalemia - K+ ordered -Trend in the AM with magnesium      Advance Care Planning:   Code Status: Prior full  Consults: None  Family Communication: No family at bedside  Severity of Illness: The appropriate patient status for this patient is OBSERVATION. Observation status is judged to be reasonable and necessary in order to provide the required intensity of service to ensure the patient's safety. The patient's presenting symptoms, physical exam findings, and initial radiographic and laboratory data in the context of their medical condition is felt to place them at decreased risk for further clinical deterioration. Furthermore, it is anticipated that the patient will be medically stable for discharge from the hospital within 2 midnights of admission.   Author: Lilyan Gilford, DO 03/14/2022  9:56 PM  For on call review [email or web address].

## 2022-03-15 DIAGNOSIS — D696 Thrombocytopenia, unspecified: Secondary | ICD-10-CM | POA: Diagnosis not present

## 2022-03-15 DIAGNOSIS — E785 Hyperlipidemia, unspecified: Secondary | ICD-10-CM | POA: Diagnosis present

## 2022-03-15 DIAGNOSIS — E8809 Other disorders of plasma-protein metabolism, not elsewhere classified: Secondary | ICD-10-CM | POA: Diagnosis present

## 2022-03-15 DIAGNOSIS — R072 Precordial pain: Secondary | ICD-10-CM | POA: Diagnosis present

## 2022-03-15 DIAGNOSIS — F319 Bipolar disorder, unspecified: Secondary | ICD-10-CM | POA: Diagnosis present

## 2022-03-15 DIAGNOSIS — Z90710 Acquired absence of both cervix and uterus: Secondary | ICD-10-CM | POA: Diagnosis not present

## 2022-03-15 DIAGNOSIS — Z833 Family history of diabetes mellitus: Secondary | ICD-10-CM | POA: Diagnosis not present

## 2022-03-15 DIAGNOSIS — M797 Fibromyalgia: Secondary | ICD-10-CM | POA: Diagnosis present

## 2022-03-15 DIAGNOSIS — F17210 Nicotine dependence, cigarettes, uncomplicated: Secondary | ICD-10-CM | POA: Diagnosis present

## 2022-03-15 DIAGNOSIS — G8929 Other chronic pain: Secondary | ICD-10-CM | POA: Diagnosis present

## 2022-03-15 DIAGNOSIS — K219 Gastro-esophageal reflux disease without esophagitis: Secondary | ICD-10-CM | POA: Diagnosis present

## 2022-03-15 DIAGNOSIS — E669 Obesity, unspecified: Secondary | ICD-10-CM | POA: Diagnosis present

## 2022-03-15 DIAGNOSIS — E876 Hypokalemia: Secondary | ICD-10-CM | POA: Diagnosis present

## 2022-03-15 DIAGNOSIS — J9601 Acute respiratory failure with hypoxia: Secondary | ICD-10-CM | POA: Diagnosis present

## 2022-03-15 DIAGNOSIS — J441 Chronic obstructive pulmonary disease with (acute) exacerbation: Secondary | ICD-10-CM | POA: Diagnosis present

## 2022-03-15 DIAGNOSIS — Z6833 Body mass index (BMI) 33.0-33.9, adult: Secondary | ICD-10-CM | POA: Diagnosis not present

## 2022-03-15 DIAGNOSIS — F17200 Nicotine dependence, unspecified, uncomplicated: Secondary | ICD-10-CM | POA: Diagnosis not present

## 2022-03-15 DIAGNOSIS — R0602 Shortness of breath: Secondary | ICD-10-CM | POA: Diagnosis present

## 2022-03-15 DIAGNOSIS — E039 Hypothyroidism, unspecified: Secondary | ICD-10-CM | POA: Diagnosis present

## 2022-03-15 DIAGNOSIS — I10 Essential (primary) hypertension: Secondary | ICD-10-CM | POA: Diagnosis present

## 2022-03-15 DIAGNOSIS — Z8673 Personal history of transient ischemic attack (TIA), and cerebral infarction without residual deficits: Secondary | ICD-10-CM | POA: Diagnosis not present

## 2022-03-15 DIAGNOSIS — F419 Anxiety disorder, unspecified: Secondary | ICD-10-CM | POA: Diagnosis present

## 2022-03-15 DIAGNOSIS — F32A Depression, unspecified: Secondary | ICD-10-CM | POA: Diagnosis present

## 2022-03-15 DIAGNOSIS — J9690 Respiratory failure, unspecified, unspecified whether with hypoxia or hypercapnia: Secondary | ICD-10-CM | POA: Diagnosis present

## 2022-03-15 DIAGNOSIS — I7300 Raynaud's syndrome without gangrene: Secondary | ICD-10-CM | POA: Diagnosis present

## 2022-03-15 DIAGNOSIS — E872 Acidosis, unspecified: Secondary | ICD-10-CM | POA: Diagnosis present

## 2022-03-15 DIAGNOSIS — Z1152 Encounter for screening for COVID-19: Secondary | ICD-10-CM | POA: Diagnosis not present

## 2022-03-15 LAB — CBC WITH DIFFERENTIAL/PLATELET
Abs Immature Granulocytes: 0.02 10*3/uL (ref 0.00–0.07)
Basophils Absolute: 0 10*3/uL (ref 0.0–0.1)
Basophils Relative: 0 %
Eosinophils Absolute: 0 10*3/uL (ref 0.0–0.5)
Eosinophils Relative: 0 %
HCT: 44.4 % (ref 36.0–46.0)
Hemoglobin: 14.3 g/dL (ref 12.0–15.0)
Immature Granulocytes: 0 %
Lymphocytes Relative: 7 %
Lymphs Abs: 0.4 10*3/uL — ABNORMAL LOW (ref 0.7–4.0)
MCH: 30.7 pg (ref 26.0–34.0)
MCHC: 32.2 g/dL (ref 30.0–36.0)
MCV: 95.3 fL (ref 80.0–100.0)
Monocytes Absolute: 0.5 10*3/uL (ref 0.1–1.0)
Monocytes Relative: 10 %
Neutro Abs: 4.4 10*3/uL (ref 1.7–7.7)
Neutrophils Relative %: 83 %
Platelets: 113 10*3/uL — ABNORMAL LOW (ref 150–400)
RBC: 4.66 MIL/uL (ref 3.87–5.11)
RDW: 13.6 % (ref 11.5–15.5)
WBC: 5.3 10*3/uL (ref 4.0–10.5)
nRBC: 0 % (ref 0.0–0.2)

## 2022-03-15 LAB — EXPECTORATED SPUTUM ASSESSMENT W GRAM STAIN, RFLX TO RESP C

## 2022-03-15 LAB — COMPREHENSIVE METABOLIC PANEL
ALT: 23 U/L (ref 0–44)
AST: 34 U/L (ref 15–41)
Albumin: 2.4 g/dL — ABNORMAL LOW (ref 3.5–5.0)
Alkaline Phosphatase: 58 U/L (ref 38–126)
Anion gap: 3 — ABNORMAL LOW (ref 5–15)
BUN: 6 mg/dL — ABNORMAL LOW (ref 8–23)
CO2: 27 mmol/L (ref 22–32)
Calcium: 7.1 mg/dL — ABNORMAL LOW (ref 8.9–10.3)
Chloride: 111 mmol/L (ref 98–111)
Creatinine, Ser: 0.39 mg/dL — ABNORMAL LOW (ref 0.44–1.00)
GFR, Estimated: >60 mL/min (ref >60)
Glucose, Bld: 110 mg/dL — ABNORMAL HIGH (ref 70–99)
Potassium: 3.4 mmol/L — ABNORMAL LOW (ref 3.5–5.1)
Sodium: 141 mmol/L (ref 135–145)
Total Bilirubin: 0.5 mg/dL (ref 0.3–1.2)
Total Protein: 5.3 g/dL — ABNORMAL LOW (ref 6.5–8.1)

## 2022-03-15 LAB — MAGNESIUM: Magnesium: 1.7 mg/dL (ref 1.7–2.4)

## 2022-03-15 LAB — TSH: TSH: 0.285 u[IU]/mL — ABNORMAL LOW (ref 0.350–4.500)

## 2022-03-15 LAB — LACTIC ACID, PLASMA: Lactic Acid, Venous: 1.9 mmol/L (ref 0.5–1.9)

## 2022-03-15 MED ORDER — MIRABEGRON ER 50 MG PO TB24
50.0000 mg | ORAL_TABLET | Freq: Every day | ORAL | Status: DC
  Administered 2022-03-16 – 2022-03-17 (×2): 50 mg via ORAL
  Filled 2022-03-15 (×3): qty 1

## 2022-03-15 MED ORDER — PREDNISONE 20 MG PO TABS
40.0000 mg | ORAL_TABLET | Freq: Every day | ORAL | Status: DC
  Administered 2022-03-16 – 2022-03-17 (×2): 40 mg via ORAL
  Filled 2022-03-15 (×2): qty 2

## 2022-03-15 MED ORDER — ROSUVASTATIN CALCIUM 10 MG PO TABS
40.0000 mg | ORAL_TABLET | Freq: Every day | ORAL | Status: DC
  Administered 2022-03-15 – 2022-03-17 (×3): 40 mg via ORAL
  Filled 2022-03-15: qty 2
  Filled 2022-03-15: qty 4

## 2022-03-15 MED ORDER — ONDANSETRON HCL 4 MG/2ML IJ SOLN
4.0000 mg | Freq: Four times a day (QID) | INTRAMUSCULAR | Status: DC | PRN
  Administered 2022-03-15: 4 mg via INTRAVENOUS
  Filled 2022-03-15: qty 2

## 2022-03-15 MED ORDER — HEPARIN SODIUM (PORCINE) 5000 UNIT/ML IJ SOLN
5000.0000 [IU] | Freq: Three times a day (TID) | INTRAMUSCULAR | Status: DC
  Administered 2022-03-15 – 2022-03-17 (×7): 5000 [IU] via SUBCUTANEOUS
  Filled 2022-03-15 (×8): qty 1

## 2022-03-15 MED ORDER — MORPHINE SULFATE ER 15 MG PO TBCR
15.0000 mg | EXTENDED_RELEASE_TABLET | Freq: Two times a day (BID) | ORAL | Status: DC
  Administered 2022-03-15 – 2022-03-17 (×6): 15 mg via ORAL
  Filled 2022-03-15 (×6): qty 1

## 2022-03-15 MED ORDER — PANTOPRAZOLE SODIUM 40 MG PO TBEC
40.0000 mg | DELAYED_RELEASE_TABLET | Freq: Every day | ORAL | Status: DC
  Administered 2022-03-15 – 2022-03-17 (×3): 40 mg via ORAL
  Filled 2022-03-15 (×3): qty 1

## 2022-03-15 MED ORDER — METHYLPREDNISOLONE SODIUM SUCC 125 MG IJ SOLR
125.0000 mg | Freq: Two times a day (BID) | INTRAMUSCULAR | Status: AC
  Administered 2022-03-15 (×2): 125 mg via INTRAVENOUS
  Filled 2022-03-15 (×2): qty 2

## 2022-03-15 MED ORDER — OXYCODONE HCL 5 MG PO TABS
5.0000 mg | ORAL_TABLET | ORAL | Status: DC | PRN
  Administered 2022-03-15 (×2): 5 mg via ORAL
  Filled 2022-03-15 (×2): qty 1

## 2022-03-15 MED ORDER — VENLAFAXINE HCL ER 75 MG PO CP24
150.0000 mg | ORAL_CAPSULE | Freq: Every evening | ORAL | Status: DC
  Administered 2022-03-15 – 2022-03-16 (×2): 150 mg via ORAL
  Filled 2022-03-15: qty 1
  Filled 2022-03-15 (×2): qty 2

## 2022-03-15 MED ORDER — IPRATROPIUM-ALBUTEROL 0.5-2.5 (3) MG/3ML IN SOLN
3.0000 mL | Freq: Four times a day (QID) | RESPIRATORY_TRACT | Status: DC
  Administered 2022-03-15 – 2022-03-17 (×10): 3 mL via RESPIRATORY_TRACT
  Filled 2022-03-15 (×10): qty 3

## 2022-03-15 MED ORDER — OXYCODONE HCL 5 MG PO TABS
10.0000 mg | ORAL_TABLET | Freq: Four times a day (QID) | ORAL | Status: DC | PRN
  Administered 2022-03-16 – 2022-03-17 (×2): 10 mg via ORAL
  Filled 2022-03-15 (×3): qty 2

## 2022-03-15 MED ORDER — ONDANSETRON HCL 4 MG PO TABS: 4.0000 mg | ORAL_TABLET | Freq: Four times a day (QID) | ORAL | Status: DC | PRN

## 2022-03-15 MED ORDER — ZIPRASIDONE HCL 40 MG PO CAPS
40.0000 mg | ORAL_CAPSULE | Freq: Every day | ORAL | Status: DC
  Administered 2022-03-15 – 2022-03-16 (×3): 40 mg via ORAL
  Filled 2022-03-15 (×3): qty 1

## 2022-03-15 MED ORDER — LEVALBUTEROL HCL 0.63 MG/3ML IN NEBU: 0.6300 mg | INHALATION_SOLUTION | Freq: Four times a day (QID) | RESPIRATORY_TRACT | Status: DC | PRN

## 2022-03-15 MED ORDER — PREGABALIN 50 MG PO CAPS
100.0000 mg | ORAL_CAPSULE | Freq: Three times a day (TID) | ORAL | Status: DC
  Administered 2022-03-15 – 2022-03-17 (×7): 100 mg via ORAL
  Filled 2022-03-15 (×7): qty 2

## 2022-03-15 MED ORDER — ACETAMINOPHEN 650 MG RE SUPP: 650.0000 mg | Freq: Four times a day (QID) | RECTAL | Status: DC | PRN

## 2022-03-15 MED ORDER — HYDROXYZINE HCL 25 MG PO TABS
25.0000 mg | ORAL_TABLET | Freq: Two times a day (BID) | ORAL | Status: DC
  Administered 2022-03-15 – 2022-03-17 (×6): 25 mg via ORAL
  Filled 2022-03-15 (×7): qty 1

## 2022-03-15 MED ORDER — MOMETASONE FURO-FORMOTEROL FUM 200-5 MCG/ACT IN AERO
2.0000 | INHALATION_SPRAY | Freq: Two times a day (BID) | RESPIRATORY_TRACT | Status: DC
  Administered 2022-03-15 – 2022-03-17 (×4): 2 via RESPIRATORY_TRACT
  Filled 2022-03-15: qty 8.8

## 2022-03-15 MED ORDER — TIOTROPIUM BROMIDE MONOHYDRATE 18 MCG IN CAPS: 1.0000 | ORAL_CAPSULE | Freq: Every day | RESPIRATORY_TRACT | Status: DC

## 2022-03-15 MED ORDER — LORATADINE 10 MG PO TABS
10.0000 mg | ORAL_TABLET | Freq: Every day | ORAL | Status: DC
  Administered 2022-03-15 – 2022-03-17 (×3): 10 mg via ORAL
  Filled 2022-03-15 (×3): qty 1

## 2022-03-15 MED ORDER — ACETAMINOPHEN 325 MG PO TABS
650.0000 mg | ORAL_TABLET | Freq: Four times a day (QID) | ORAL | Status: DC | PRN
  Administered 2022-03-15: 650 mg via ORAL
  Filled 2022-03-15: qty 2

## 2022-03-15 MED ORDER — METOPROLOL SUCCINATE ER 50 MG PO TB24
50.0000 mg | ORAL_TABLET | Freq: Every day | ORAL | Status: DC
  Administered 2022-03-15 – 2022-03-16 (×3): 50 mg via ORAL
  Filled 2022-03-15 (×3): qty 1

## 2022-03-15 MED ORDER — LEVOTHYROXINE SODIUM 88 MCG PO TABS
88.0000 ug | ORAL_TABLET | Freq: Every day | ORAL | Status: DC
  Administered 2022-03-15 – 2022-03-17 (×3): 88 ug via ORAL
  Filled 2022-03-15 (×3): qty 1

## 2022-03-15 MED ORDER — POTASSIUM CHLORIDE CRYS ER 20 MEQ PO TBCR
20.0000 meq | EXTENDED_RELEASE_TABLET | Freq: Once | ORAL | Status: AC
  Administered 2022-03-15: 20 meq via ORAL
  Filled 2022-03-15: qty 1

## 2022-03-15 NOTE — Progress Notes (Signed)
PROGRESS NOTE    April Soto  [id number] DOB: [birth year] DOA: 03/14/2022 PCP: Jearld Fenton, NP    Assessment & Plan:   Principal Problem:   Acute respiratory failure with hypoxia ([place]) Active Problems:   Hypokalemia   GERD (gastroesophageal reflux disease)   Acquired hypothyroidism   COPD with acute exacerbation (HCC)   Lactic acidosis   Hypoalbuminemia  Assessment and Plan: Acute hypoxic respiratory failure: CTA chest neg for PE or pneumonia. Found to be 84% on 4L [place]. Continue on steroids, bronchodilators, encourage incentive spirometry. Continue on supplemental oxygen and wean as tolerated  COPD exacerbation: continue on azithromycin, steroids, bronchodilators & encourage incentive spirometry. Procal 0.13   Smoker: smoking cessation counseling x 5 mins. Quit smoking 4 days ago as per pt   Thrombocytopenia: etiology unclear. Will continue to monitor    Hypoalbuminemia: encourage po intake    Lactic acidosis: resolved    Acquired hypothyroidism: continue on home dose of synthroid    GERD: continue on PPI    Hypokalemia: potassium given    Obesity: 33.2. Would benefit from weight loss     DVT prophylaxis:  heparin SQ Code Status: full  Family Communication:  Disposition Plan: likely d/c back home   Level of care: Telemetry Medical  Status is: Inpatient Remains inpatient appropriate because: severity of illness     Consultants:    Procedures:   Antimicrobials: azithromycin    Subjective: Pt c/o shortness of breath   Objective: Vitals:   03/15/22 0400 03/15/22 0500 03/15/22 0542 03/15/22 0731  BP: 121/70 119/83    Pulse: (!) 121 (!) 114    Resp: 20 (!) 28    Temp:   (!) 101.5 F (38.6 C) 100.3 F (37.9 C)  TempSrc:   Oral Oral  SpO2: 94% 96%    Weight:      Height:        Intake/Output Summary (Last 24 hours) at 03/15/2022 0840 Last data filed at 03/14/2022 2316 Gross per 24 hour  Intake 3350 ml  Output 560 ml  Net 2790 ml    Filed Weights   03/14/22 1814  Weight: 93.4 kg    Examination:  General exam: Appears calm and comfortable  Respiratory system: diminished breath sounds b/l Cardiovascular system: S1 & S2+. No rubs, gallops or clicks. . Gastrointestinal system: Abdomen is nondistended, soft and nontender.  Normal bowel sounds heard. Central nervous system: Alert and oriented.Moves all extremities Psychiatry: Judgement and insight appear normal. Flat mood and affect     Data Reviewed: I have personally reviewed following labs and imaging studies  CBC: Recent Labs  Lab 03/14/22 1811 03/15/22 0540  WBC 7.9 5.3  NEUTROABS 6.9 4.4  HGB 16.8* 14.3  HCT 49.6* 44.4  MCV 91.5 95.3  PLT 127* [id number]*   Basic Metabolic Panel: Recent Labs  Lab 03/14/22 1811 03/15/22 0540  NA 137 141  K 3.0* 3.4*  CL 98 111  CO2 27 27  GLUCOSE 153* 110*  BUN 9 6*  CREATININE 0.64 0.39*  CALCIUM 8.7* 7.1*  MG  --  1.7   GFR: Estimated Creatinine Clearance: 82.8 mL/min (A) (by C-G formula based on SCr of 0.39 mg/dL (L)). Liver Function Tests: Recent Labs  Lab 03/14/22 1811 03/15/22 0540  AST 52* 34  ALT 34 23  ALKPHOS 85 58  BILITOT 1.6* 0.5  PROT 7.8 5.3*  ALBUMIN 3.4* 2.4*   No results for input(s): "LIPASE", "AMYLASE" in the last 168 hours. No results for  input(s): "AMMONIA" in the last 168 hours. Coagulation Profile: No results for input(s): "INR", "PROTIME" in the last 168 hours. Cardiac Enzymes: No results for input(s): "CKTOTAL", "CKMB", "CKMBINDEX", "TROPONINI" in the last 168 hours. BNP (last 3 results) No results for input(s): "PROBNP" in the last 8760 hours. HbA1C: No results for input(s): "HGBA1C" in the last 72 hours. CBG: No results for input(s): "GLUCAP" in the last 168 hours. Lipid Profile: No results for input(s): "CHOL", "HDL", "LDLCALC", "TRIG", "CHOLHDL", "LDLDIRECT" in the last 72 hours. Thyroid Function Tests: Recent Labs    03/15/22 0540  TSH 0.285*   Anemia  Panel: No results for input(s): "VITAMINB12", "FOLATE", "FERRITIN", "TIBC", "IRON", "RETICCTPCT" in the last 72 hours. Sepsis Labs: Recent Labs  Lab 03/14/22 1811 03/14/22 2048  PROCALCITON 0.13  --   LATICACIDVEN 4.1* 2.9*    Recent Results (from the past 240 hour(s))  Resp Panel by RT-PCR (Flu A&B, Covid) Anterior Nasal Swab     Status: None   Collection Time: 03/14/22  6:11 PM   Specimen: Anterior Nasal Swab  Result Value Ref Range Status   SARS Coronavirus 2 by RT PCR NEGATIVE NEGATIVE Final    Comment: (NOTE) SARS-CoV-2 target nucleic acids are NOT DETECTED.  The SARS-CoV-2 RNA is generally detectable in upper respiratory specimens during the acute phase of infection. The lowest concentration of SARS-CoV-2 viral copies this assay can detect is 138 copies/mL. A negative result does not preclude SARS-Cov-2 infection and should not be used as the sole basis for treatment or other patient management decisions. A negative result may occur with  improper specimen collection/handling, submission of specimen other than nasopharyngeal swab, presence of viral mutation(s) within the areas targeted by this assay, and inadequate number of viral copies(<138 copies/mL). A negative result must be combined with clinical observations, patient history, and epidemiological information. The expected result is Negative.  Fact Sheet for Patients:  EntrepreneurPulse.com.au  Fact Sheet for Healthcare Providers:  IncredibleEmployment.be  This test is no t yet approved or cleared by the [place] FDA and  has been authorized for detection and/or diagnosis of SARS-CoV-2 by FDA under an Emergency Use Authorization (EUA). This EUA will remain  in effect (meaning this test can be used) for the duration of the COVID-19 declaration under Section 564(b)(1) of the Act, 21 U.S.C.section 360bbb-3(b)(1), unless the authorization is terminated  or revoked sooner.        Influenza A by PCR NEGATIVE NEGATIVE Final   Influenza B by PCR NEGATIVE NEGATIVE Final    Comment: (NOTE) The Xpert Xpress SARS-CoV-2/FLU/RSV plus assay is intended as an aid in the diagnosis of influenza from Nasopharyngeal swab specimens and should not be used as a sole basis for treatment. Nasal washings and aspirates are unacceptable for Xpert Xpress SARS-CoV-2/FLU/RSV testing.  Fact Sheet for Patients: EntrepreneurPulse.com.au  Fact Sheet for Healthcare Providers: IncredibleEmployment.be  This test is not yet approved or cleared by the [place] FDA and has been authorized for detection and/or diagnosis of SARS-CoV-2 by FDA under an Emergency Use Authorization (EUA). This EUA will remain in effect (meaning this test can be used) for the duration of the COVID-19 declaration under Section 564(b)(1) of the Act, 21 U.S.C. section 360bbb-3(b)(1), unless the authorization is terminated or revoked.  Performed at [facility], [address] [place], [place] [address]   Blood culture (routine x 2)     Status: None (Preliminary result)   Collection Time: 03/14/22  8:48 PM   Specimen: BLOOD RIGHT ARM  Result Value Ref Range Status   Specimen Description BLOOD RIGHT ARM  Final   Special Requests Blood Culture adequate volume  Final   Culture   Final    NO GROWTH < 12 HOURS Performed at [facility], [address] [place], [place] [address]    Report Status PENDING  Incomplete  Blood culture (routine x 2)     Status: None (Preliminary result)   Collection Time: 03/14/22  8:48 PM   Specimen: BLOOD LEFT ARM  Result Value Ref Range Status   Specimen Description BLOOD LEFT ARM  Final   Special Requests   Final    Blood Culture results may not be optimal due to an inadequate volume of blood received in culture bottles   Culture   Final    NO GROWTH < 12 HOURS Performed at [facility], [address] [place], [place] [address]    Report Status PENDING  Incomplete  Expectorated Sputum Assessment w Gram Stain, Rflx to Resp Cult     Status: None   Collection Time: 03/15/22  5:40 AM   Specimen: Sputum  Result Value Ref Range Status   Specimen Description SPUTUM  Final   Special Requests NONE  Final   Sputum evaluation   Final    THIS SPECIMEN IS ACCEPTABLE FOR SPUTUM CULTURE Performed at [facility], [address] [place], [place] [address]    Report Status 03/15/2022 FINAL  Final         Radiology Studies: CT Angio Chest Pulmonary Embolism (PE) W or WO Contrast  Result Date: 03/14/2022 CLINICAL DATA:  Cough, shortness of breath, fever. Pulmonary embolism (PE) suspected, low to intermediate prob, positive D-dimer EXAM: CT ANGIOGRAPHY CHEST WITH CONTRAST TECHNIQUE: Multidetector CT imaging of the chest was performed using the standard protocol during bolus administration of intravenous contrast. Multiplanar CT image reconstructions and MIPs were obtained to evaluate the vascular anatomy. RADIATION DOSE REDUCTION: This exam was performed according to the departmental dose-optimization program which includes automated exposure control, adjustment of the mA and/or kV according to patient size and/or use of iterative reconstruction technique. CONTRAST:  9mL OMNIPAQUE IOHEXOL 350 MG/ML SOLN COMPARISON:  05/10/2021 FINDINGS: Cardiovascular: No filling defects in the pulmonary arteries to suggest pulmonary emboli. Heart is normal size. Aorta is normal caliber. Scattered coronary artery and aortic calcifications. Mediastinum/Nodes: No mediastinal, hilar, or axillary adenopathy. Trachea and esophagus are unremarkable. Thyroid unremarkable. Lungs/Pleura: No confluent opacities or effusions. Upper Abdomen: No acute findings Musculoskeletal: Chest wall soft tissues are unremarkable. No acute bony abnormality. Review of the MIP images confirms the above findings. IMPRESSION: No evidence of  pulmonary embolus. No acute cardiopulmonary disease. Aortic Atherosclerosis (ICD10-I70.0). Electronically Signed   By: Charlett Nose M.D.   On: 03/14/2022 23:03   DG Chest Portable 1 View  Result Date: 03/14/2022 CLINICAL DATA:  Cough, fever EXAM: PORTABLE CHEST 1 VIEW COMPARISON:  05/10/2021 FINDINGS: The heart size and mediastinal contours are within normal limits. Both lungs are clear. The visualized skeletal structures are unremarkable. IMPRESSION: No active disease. Electronically Signed   By: Charlett Nose M.D.   On: 03/14/2022 19:01        Scheduled Meds:  heparin  5,000 Units Subcutaneous Q8H   hydrOXYzine  25 mg Oral BID   ipratropium-albuterol  3 mL Nebulization Q6H   levothyroxine  88 mcg Oral Q0600   loratadine  10 mg Oral Daily   methylPREDNISolone (SOLU-MEDROL) injection  125 mg Intravenous Q12H   Followed  by   Derrill Memo ON 03/16/2022] predniSONE  40 mg Oral Q breakfast   metoprolol succinate  50 mg Oral QHS   mirabegron ER  50 mg Oral Daily   mometasone-formoterol  2 puff Inhalation BID   morphine  15 mg Oral Q12H   pantoprazole  40 mg Oral Daily   pregabalin  100 mg Oral TID   rosuvastatin  40 mg Oral Daily   venlafaxine XR  150 mg Oral QPM   ziprasidone  40 mg Oral QHS   Continuous Infusions:  azithromycin Stopped (03/14/22 2316)   cefTRIAXone (ROCEPHIN)  IV Stopped (03/14/22 2125)     LOS: 0 days    Time spent: 35 mins    Wyvonnia Dusky, MD [facility] Pager [phone]  If 7PM-7AM, please contact night-coverage [email or web address]  03/15/2022, 8:40 AM

## 2022-03-15 NOTE — ED Notes (Signed)
Dr. Mayford Knife notified pt. Is requesting her schedule 10mg  oxycodone TID.

## 2022-03-15 NOTE — ED Notes (Signed)
This RN to bedside for pt. Calling out for help. Pt states she feels very hot. Temp obtained, fan provided for comfort, and ice water per pt. Request. Cardiac monitoring maintained, pure wick in place and functional. Pt. Is alert and oriented, and conversational. Will continue to monitor.

## 2022-03-16 LAB — BASIC METABOLIC PANEL
Anion gap: 4 — ABNORMAL LOW (ref 5–15)
BUN: 10 mg/dL (ref 8–23)
CO2: 29 mmol/L (ref 22–32)
Calcium: 8.2 mg/dL — ABNORMAL LOW (ref 8.9–10.3)
Chloride: 106 mmol/L (ref 98–111)
Creatinine, Ser: 0.45 mg/dL (ref 0.44–1.00)
GFR, Estimated: >60 mL/min (ref >60)
Glucose, Bld: 147 mg/dL — ABNORMAL HIGH (ref 70–99)
Potassium: 3.9 mmol/L (ref 3.5–5.1)
Sodium: 139 mmol/L (ref 135–145)

## 2022-03-16 LAB — CBC
HCT: 40.5 % (ref 36.0–46.0)
Hemoglobin: 13.4 g/dL (ref 12.0–15.0)
MCH: 30.9 pg (ref 26.0–34.0)
MCHC: 33.1 g/dL (ref 30.0–36.0)
MCV: 93.5 fL (ref 80.0–100.0)
Platelets: 112 10*3/uL — ABNORMAL LOW (ref 150–400)
RBC: 4.33 MIL/uL (ref 3.87–5.11)
RDW: 13.7 % (ref 11.5–15.5)
WBC: 5.1 10*3/uL (ref 4.0–10.5)
nRBC: 0 % (ref 0.0–0.2)

## 2022-03-16 LAB — MAGNESIUM: Magnesium: 2 mg/dL (ref 1.7–2.4)

## 2022-03-16 MED ORDER — SODIUM CHLORIDE 0.9 % IV SOLN: INTRAVENOUS | Status: DC | PRN

## 2022-03-16 NOTE — Progress Notes (Signed)
Mobility Specialist - Progress Note   Pre-mobility:SpO2 (93) During mobility: SpO2 (87) Post-mobility: SPO2(90)   SpO2 test  03/16/22 1208  Mobility  Activity Ambulated with assistance in hallway;Stood at bedside  Level of Assistance Standby assist, set-up cues, supervision of patient - no hands on  Assistive Device Front wheel walker  Distance Ambulated (ft) 15 ft  Activity Response Tolerated well  Mobility Referral Yes  $Mobility charge 1 Mobility   Pt resting in bed on 1.5L upon entry. Pt bumped down to 1.0 at EOB to begin SpO2 test and STS to RW; Pt dropped to 87% and sat back down. Author bumped Pt to 2.0 L resting and then back down to 1.0L to try again. Pt ambulates to hallway CGA and took a seated rest break after 15 ft. SpO2 level 88%. Pt returned to bed and bumped back up to 1.5 L and left with needs in reach. RN notified.   Johnathan Hausen Mobility Specialist 03/16/22, 12:15 PM

## 2022-03-16 NOTE — Plan of Care (Signed)
  Problem: Respiratory: Goal: Ability to maintain a clear airway will improve Outcome: Progressing   Problem: Pain Managment: Goal: General experience of comfort will improve Outcome: Progressing   Problem: Safety: Goal: Ability to remain free from injury will improve Outcome: Progressing   

## 2022-03-16 NOTE — Progress Notes (Signed)
PROGRESS NOTE    April Soto  WUJ:[id number] DOB: 07-Oct-1960 DOA: 03/14/2022 PCP: Lorre Munroe, NP    Assessment & Plan:   Principal Problem:   Acute respiratory failure with hypoxia (HCC) Active Problems:   Hypokalemia   GERD (gastroesophageal reflux disease)   Acquired hypothyroidism   COPD with acute exacerbation (HCC)   Lactic acidosis   Hypoalbuminemia   Respiratory failure (HCC)  Assessment and Plan: Acute hypoxic respiratory failure: CTA chest neg for PE or pneumonia. Found to be 84% on 4L [place]. Desaturated to 87% on 1 L [place] today w/ exertion. Continue on abxs, steroids, bronchodilators & encourage incentive spirometry. Continue on supplemental oxygen & wean as tolearted  COPD exacerbation: continue on azithromycin, steroids, bronchodilators & encourage incentive spirometry   Smoker: smoking cessation counseling x 5 mins. Quit smoking 4 days prior to admission as per pt   Thrombocytopenia: etiology unclear, labile     Hypoalbuminemia: encourage po intake    Lactic acidosis: resolved    Acquired hypothyroidism: continue on home dose of levothyroxine     GERD: continue on PPI    Hypokalemia: WNL today    Obesity: 33.2. Would benefit from weight loss        DVT prophylaxis:  heparin SQ Code Status: full  Family Communication:  Disposition Plan: likely d/c back home   Level of care: Telemetry Medical  Status is: Inpatient Remains inpatient appropriate because: severity of illness, still requiring oxygen      Consultants:    Procedures:   Antimicrobials: azithromycin    Subjective: Pt c/o shortness of breath but slightly improved from day prior   Objective: Vitals:   03/15/22 1956 03/15/22 2019 03/16/22 0149 03/16/22 0501  BP: 112/69   110/65  Pulse: 85   (!) 106  Resp: 16   20  Temp: 98.9 F (37.2 C)   98.8 F (37.1 C)  TempSrc: Oral     SpO2: 97% 96% 94% 90%  Weight:      Height:        Intake/Output Summary (Last 24 hours)  at 03/16/2022 0757 Last data filed at 03/16/2022 0125 Gross per 24 hour  Intake 360 ml  Output 1300 ml  Net -940 ml   Filed Weights   03/14/22 1814  Weight: 93.4 kg    Examination:  General exam: Appears comfortable   Respiratory system: decreased breath sounds b/l  Cardiovascular system: S1/S2+. No rubs or clicks  Gastrointestinal system: abd is soft, NT, obese & normal bowel sounds  Central nervous system: alert and oriented. Moves all extremities  Psychiatry: judgement and insight appears at baseline. Flat mood and affect      Data Reviewed: I have personally reviewed following labs and imaging studies  CBC: Recent Labs  Lab 03/14/22 1811 03/15/22 0540 03/16/22 0534  WBC 7.9 5.3 5.1  NEUTROABS 6.9 4.4  --   HGB 16.8* 14.3 13.4  HCT 49.6* 44.4 40.5  MCV 91.5 95.3 93.5  PLT 127* 113* 112*   Basic Metabolic Panel: Recent Labs  Lab 03/14/22 1811 03/15/22 0540 03/16/22 0534  NA 137 141 139  K 3.0* 3.4* 3.9  CL 98 111 106  CO2 27 27 29   GLUCOSE 153* 110* 147*  BUN 9 6* 10  CREATININE 0.64 0.39* 0.45  CALCIUM 8.7* 7.1* 8.2*  MG  --  1.7 2.0   GFR: Estimated Creatinine Clearance: 82.8 mL/min (by C-G formula based on SCr of 0.45 mg/dL). Liver Function Tests: Recent Labs  Lab 03/14/22 1811 03/15/22 0540  AST 52* 34  ALT 34 23  ALKPHOS 85 58  BILITOT 1.6* 0.5  PROT 7.8 5.3*  ALBUMIN 3.4* 2.4*   No results for input(s): "LIPASE", "AMYLASE" in the last 168 hours. No results for input(s): "AMMONIA" in the last 168 hours. Coagulation Profile: No results for input(s): "INR", "PROTIME" in the last 168 hours. Cardiac Enzymes: No results for input(s): "CKTOTAL", "CKMB", "CKMBINDEX", "TROPONINI" in the last 168 hours. BNP (last 3 results) No results for input(s): "PROBNP" in the last 8760 hours. HbA1C: No results for input(s): "HGBA1C" in the last 72 hours. CBG: No results for input(s): "GLUCAP" in the last 168 hours. Lipid Profile: No results for  input(s): "CHOL", "HDL", "LDLCALC", "TRIG", "CHOLHDL", "LDLDIRECT" in the last 72 hours. Thyroid Function Tests: Recent Labs    03/15/22 0540  TSH 0.285*   Anemia Panel: No results for input(s): "VITAMINB12", "FOLATE", "FERRITIN", "TIBC", "IRON", "RETICCTPCT" in the last 72 hours. Sepsis Labs: Recent Labs  Lab 03/14/22 1811 03/14/22 2048 03/15/22 1313  PROCALCITON 0.13  --   --   LATICACIDVEN 4.1* 2.9* 1.9    Recent Results (from the past 240 hour(s))  Resp Panel by RT-PCR (Flu A&B, Covid) Anterior Nasal Swab     Status: None   Collection Time: 03/14/22  6:11 PM   Specimen: Anterior Nasal Swab  Result Value Ref Range Status   SARS Coronavirus 2 by RT PCR NEGATIVE NEGATIVE Final    Comment: (NOTE) SARS-CoV-2 target nucleic acids are NOT DETECTED.  The SARS-CoV-2 RNA is generally detectable in upper respiratory specimens during the acute phase of infection. The lowest concentration of SARS-CoV-2 viral copies this assay can detect is 138 copies/mL. A negative result does not preclude SARS-Cov-2 infection and should not be used as the sole basis for treatment or other patient management decisions. A negative result may occur with  improper specimen collection/handling, submission of specimen other than nasopharyngeal swab, presence of viral mutation(s) within the areas targeted by this assay, and inadequate number of viral copies(<138 copies/mL). A negative result must be combined with clinical observations, patient history, and epidemiological information. The expected result is Negative.  Fact Sheet for Patients:  BloggerCourse.comhttps://www.fda.gov/media/152166/download  Fact Sheet for Healthcare Providers:  SeriousBroker.ithttps://www.fda.gov/media/152162/download  This test is no t yet approved or cleared by the [place] FDA and  has been authorized for detection and/or diagnosis of SARS-CoV-2 by FDA under an Emergency Use Authorization (EUA). This EUA will remain  in effect (meaning this  test can be used) for the duration of the COVID-19 declaration under Section 564(b)(1) of the Act, 21 U.S.C.section 360bbb-3(b)(1), unless the authorization is terminated  or revoked sooner.       Influenza A by PCR NEGATIVE NEGATIVE Final   Influenza B by PCR NEGATIVE NEGATIVE Final    Comment: (NOTE) The Xpert Xpress SARS-CoV-2/FLU/RSV plus assay is intended as an aid in the diagnosis of influenza from Nasopharyngeal swab specimens and should not be used as a sole basis for treatment. Nasal washings and aspirates are unacceptable for Xpert Xpress SARS-CoV-2/FLU/RSV testing.  Fact Sheet for Patients: BloggerCourse.comhttps://www.fda.gov/media/152166/download  Fact Sheet for Healthcare Providers: SeriousBroker.ithttps://www.fda.gov/media/152162/download  This test is not yet approved or cleared by the [place] FDA and has been authorized for detection and/or diagnosis of SARS-CoV-2 by FDA under an Emergency Use Authorization (EUA). This EUA will remain in effect (meaning this test can be used) for the duration of the COVID-19 declaration under Section 564(b)(1) of the Act, 21 U.S.C.  section 360bbb-3(b)(1), unless the authorization is terminated or revoked.  Performed at [facility], [address] [place], [place] [address]   Blood culture (routine x 2)     Status: None (Preliminary result)   Collection Time: 03/14/22  8:48 PM   Specimen: BLOOD RIGHT ARM  Result Value Ref Range Status   Specimen Description BLOOD RIGHT ARM  Final   Special Requests Blood Culture adequate volume  Final   Culture   Final    NO GROWTH 2 DAYS Performed at [facility], [address] [place], [place] [address]    Report Status PENDING  Incomplete  Blood culture (routine x 2)     Status: None (Preliminary result)   Collection Time: 03/14/22  8:48 PM   Specimen: BLOOD LEFT ARM  Result Value Ref Range Status   Specimen Description BLOOD LEFT ARM  Final   Special Requests   Final    Blood Culture  results may not be optimal due to an inadequate volume of blood received in culture bottles   Culture   Final    NO GROWTH 2 DAYS Performed at [facility], [address] [place], [place] [address]    Report Status PENDING  Incomplete  Expectorated Sputum Assessment w Gram Stain, Rflx to Resp Cult     Status: None   Collection Time: 03/15/22  5:40 AM   Specimen: Sputum  Result Value Ref Range Status   Specimen Description SPUTUM  Final   Special Requests NONE  Final   Sputum evaluation   Final    THIS SPECIMEN IS ACCEPTABLE FOR SPUTUM CULTURE Performed at [facility], [address] [place], [place] [address]    Report Status 03/15/2022 FINAL  Final  Culture, Respiratory w Gram Stain     Status: None (Preliminary result)   Collection Time: 03/15/22  5:40 AM   Specimen: SPU  Result Value Ref Range Status   Specimen Description   Final    SPUTUM Performed at [facility], [address], [place], [place] [address]    Special Requests   Final    NONE Reflexed from [id number] Performed at [facility], [address] [place], [place] [address]    Gram Stain   Final    ABUNDANT WBC PRESENT, PREDOMINANTLY PMN FEW GRAM POSITIVE COCCI IN PAIRS RARE GRAM POSITIVE RODS Performed at [facility], [address], [place], [place] [address]    Culture PENDING  Incomplete   Report Status PENDING  Incomplete         Radiology Studies: CT Angio Chest Pulmonary Embolism (PE) W or WO Contrast  Result Date: 03/14/2022 CLINICAL DATA:  Cough, shortness of breath, fever. Pulmonary embolism (PE) suspected, low to intermediate prob, positive D-dimer EXAM: CT ANGIOGRAPHY CHEST WITH CONTRAST TECHNIQUE: Multidetector CT imaging of the chest was performed using the standard protocol during bolus administration of intravenous contrast. Multiplanar CT image reconstructions and MIPs were obtained to evaluate the vascular anatomy. RADIATION DOSE REDUCTION:  This exam was performed according to the departmental dose-optimization program which includes automated exposure control, adjustment of the mA and/or kV according to patient size and/or use of iterative reconstruction technique. CONTRAST:  40mL OMNIPAQUE IOHEXOL 350 MG/ML SOLN COMPARISON:  05/10/2021 FINDINGS: Cardiovascular: No filling defects in the pulmonary arteries to suggest pulmonary emboli. Heart is normal size. Aorta is normal caliber. Scattered coronary artery and aortic calcifications. Mediastinum/Nodes: No mediastinal, hilar, or axillary adenopathy. Trachea and esophagus are  unremarkable. Thyroid unremarkable. Lungs/Pleura: No confluent opacities or effusions. Upper Abdomen: No acute findings Musculoskeletal: Chest wall soft tissues are unremarkable. No acute bony abnormality. Review of the MIP images confirms the above findings. IMPRESSION: No evidence of pulmonary embolus. No acute cardiopulmonary disease. Aortic Atherosclerosis (ICD10-I70.0). Electronically Signed   By: Charlett Nose M.D.   On: 03/14/2022 23:03   DG Chest Portable 1 View  Result Date: 03/14/2022 CLINICAL DATA:  Cough, fever EXAM: PORTABLE CHEST 1 VIEW COMPARISON:  05/10/2021 FINDINGS: The heart size and mediastinal contours are within normal limits. Both lungs are clear. The visualized skeletal structures are unremarkable. IMPRESSION: No active disease. Electronically Signed   By: Charlett Nose M.D.   On: 03/14/2022 19:01        Scheduled Meds:  heparin  5,000 Units Subcutaneous Q8H   hydrOXYzine  25 mg Oral BID   ipratropium-albuterol  3 mL Nebulization Q6H   levothyroxine  88 mcg Oral Q0600   loratadine  10 mg Oral Daily   metoprolol succinate  50 mg Oral QHS   mirabegron ER  50 mg Oral Daily   mometasone-formoterol  2 puff Inhalation BID   morphine  15 mg Oral Q12H   pantoprazole  40 mg Oral Daily   predniSONE  40 mg Oral Q breakfast   pregabalin  100 mg Oral TID   rosuvastatin  40 mg Oral Daily   venlafaxine  XR  150 mg Oral QPM   ziprasidone  40 mg Oral QHS   Continuous Infusions:  azithromycin 500 mg (03/15/22 2103)     LOS: 1 day    Time spent: 30 mins    Charise Killian, MD [facility] Pager [phone]  If 7PM-7AM, please contact night-coverage [email or web address]  03/16/2022, 7:57 AM

## 2022-03-17 DIAGNOSIS — F17200 Nicotine dependence, unspecified, uncomplicated: Secondary | ICD-10-CM

## 2022-03-17 LAB — BASIC METABOLIC PANEL
Anion gap: 5 (ref 5–15)
BUN: 14 mg/dL (ref 8–23)
CO2: 30 mmol/L (ref 22–32)
Calcium: 8.1 mg/dL — ABNORMAL LOW (ref 8.9–10.3)
Chloride: 105 mmol/L (ref 98–111)
Creatinine, Ser: 0.47 mg/dL (ref 0.44–1.00)
GFR, Estimated: >60 mL/min (ref >60)
Glucose, Bld: 121 mg/dL — ABNORMAL HIGH (ref 70–99)
Potassium: 3.6 mmol/L (ref 3.5–5.1)
Sodium: 140 mmol/L (ref 135–145)

## 2022-03-17 LAB — CBC
HCT: 43.3 % (ref 36.0–46.0)
Hemoglobin: 14 g/dL (ref 12.0–15.0)
MCH: 30.6 pg (ref 26.0–34.0)
MCHC: 32.3 g/dL (ref 30.0–36.0)
MCV: 94.5 fL (ref 80.0–100.0)
Platelets: 105 10*3/uL — ABNORMAL LOW (ref 150–400)
RBC: 4.58 MIL/uL (ref 3.87–5.11)
RDW: 13.7 % (ref 11.5–15.5)
WBC: 6.2 10*3/uL (ref 4.0–10.5)
nRBC: 0 % (ref 0.0–0.2)

## 2022-03-17 LAB — MAGNESIUM: Magnesium: 2 mg/dL (ref 1.7–2.4)

## 2022-03-17 MED ORDER — IPRATROPIUM-ALBUTEROL 0.5-2.5 (3) MG/3ML IN SOLN
3.0000 mL | Freq: Three times a day (TID) | RESPIRATORY_TRACT | Status: DC
  Administered 2022-03-17: 3 mL via RESPIRATORY_TRACT
  Filled 2022-03-17: qty 3

## 2022-03-17 MED ORDER — POLYETHYLENE GLYCOL 3350 17 G PO PACK
17.0000 g | PACK | Freq: Every day | ORAL | Status: DC
  Administered 2022-03-17: 17 g via ORAL
  Filled 2022-03-17: qty 1

## 2022-03-17 MED ORDER — GUAIFENESIN ER 600 MG PO TB12: 600.0000 mg | ORAL_TABLET | Freq: Two times a day (BID) | ORAL | 0 refills | Status: AC

## 2022-03-17 MED ORDER — DOCUSATE SODIUM 100 MG PO CAPS
200.0000 mg | ORAL_CAPSULE | Freq: Two times a day (BID) | ORAL | Status: DC
  Administered 2022-03-17: 200 mg via ORAL
  Filled 2022-03-17: qty 2

## 2022-03-17 MED ORDER — AZITHROMYCIN 500 MG PO TABS: 500.0000 mg | ORAL_TABLET | Freq: Every day | ORAL | 0 refills | Status: AC

## 2022-03-17 MED ORDER — PREDNISONE 20 MG PO TABS: 40.0000 mg | ORAL_TABLET | Freq: Every day | ORAL | 0 refills | Status: AC

## 2022-03-17 NOTE — Plan of Care (Signed)
  Problem: Respiratory: Goal: Ability to maintain a clear airway will improve Outcome: Progressing Goal: Levels of oxygenation will improve Outcome: Progressing Goal: Ability to maintain adequate ventilation will improve Outcome: Not Progressing

## 2022-03-17 NOTE — Discharge Summary (Signed)
Physician Discharge Summary  April Soto [id number] DOB: 04/12/1961 DOA: 03/14/2022  PCP: Jearld Fenton, NP  Admit date: 03/14/2022 Discharge date: 03/17/2022  Admitted From: home  Disposition:  home   Recommendations for Outpatient Follow-up:  Follow up with PCP in 1 week  Home Health: no  Equipment/Devices:  Discharge Condition: stable  CODE STATUS: full  Diet recommendation: Heart Healthy  Brief/Interim Summary: HPI was taken from Dr. Clearence Ped: April Soto is a 61 y.o. female with medical history significant of anxiety, bipolar 1 disorder, depression, asthma, GERD, heart murmur, hypothyroidism, hypertension, hyperlipidemia, stroke, and more presents the ED with a chief complaint of dyspnea.  Patient reports that she has had dyspnea, fatigue, general malaise for 3 days.  She had a cough productive of milky colored sputum.  She had associated headache over her center forehead and right eye.  She does not have a history of headaches or migraines per her report.  She has taken Tylenol for this headache at home, and it has not been helpful.  She has associated chest pain.  She describes it as being substernal chest pain that feels like acid burning.  She had palpitations with exertion.  Patient also admits to dyspnea that is worse on exertion.  She has had dizziness as well.  Patient does not wear oxygen at home.  Patient has no other complaints at this time.  Patient openly admits that she cannot always remember details/symptoms she has been having at home.   Patient smokes 1 pack/day.  She reports that she has not smoked in 3 days and has not been having cravings.  No nicotine patch ordered at this time.  She does not drink, does not use illicit drugs.  She has been vaccinated for COVID.  Patient is full code   As per Dr. Jimmye Norman 12/2-12/4/23: Pt was found to have acute hypoxic respiratory failure likely secondary to COPD exacerbation. CTA chest was neg for pneumonia or PE.  Pt was treated w/ azithromycin, steroids, bronchodilators, supplemental oxygen & incentive spirometry. Pt was able to be weaned from supplemental oxygen prior to d/c. Pt's nurse did an oxygen desaturation test in the afternoon of 03/17/22 and pt's SaO2 level stayed at 91% or above on RA throughout the test.  Discharge Diagnoses:  Principal Problem:   Acute respiratory failure with hypoxia ([place]) Active Problems:   Hypokalemia   GERD (gastroesophageal reflux disease)   Acquired hypothyroidism   COPD with acute exacerbation (HCC)   Lactic acidosis   Hypoalbuminemia   Respiratory failure (HCC)  Acute hypoxic respiratory failure: CTA chest neg for PE or pneumonia. Found to be 84% on 4L Continue on abxs, steroids, bronchodilators & encourage incentive spirometry.  Weaned off of supplemental oxygen today    COPD exacerbation: continue on azithromycin, steroids, bronchodilators & encourage incentive spirometry    Smoker: smoking cessation counseling x 5 mins. Quit smoking 4 days prior to admission as per pt    Thrombocytopenia: etiology unclear, labile      Hypoalbuminemia: encourage po intake    Lactic acidosis: resolved    Acquired hypothyroidism: continue on home dose of synthroid   GERD: continue on PPI    Hypokalemia: WNL today    Obesity: 33.2. Would benefit from weight loss   Discharge Instructions  Discharge Instructions     Diet - low sodium heart healthy   Complete by: As directed    Discharge instructions   Complete by: As directed    F/u w/ PCP in  1 week   Increase activity slowly   Complete by: As directed       Allergies as of 03/17/2022   No Known Allergies      Medication List     STOP taking these medications    cyclobenzaprine 10 MG tablet Commonly known as: FLEXERIL   diclofenac 75 MG EC tablet Commonly known as: VOLTAREN   nitrofurantoin (macrocrystal-monohydrate) 100 MG capsule Commonly known as: MACROBID   oxyCODONE-acetaminophen 10-325  MG tablet Commonly known as: PERCOCET   Spiriva HandiHaler 18 MCG inhalation capsule Generic drug: tiotropium       TAKE these medications    acyclovir 400 MG tablet Commonly known as: ZOVIRAX TAKE 1 TABLET BY MOUTH DAILY   albuterol 108 (90 Base) MCG/ACT inhaler Commonly known as: VENTOLIN HFA Inhale 2 puffs into the lungs every 4 (four) hours as needed for wheezing or shortness of breath.   azithromycin 500 MG tablet Commonly known as: Zithromax Take 1 tablet (500 mg total) by mouth daily for 1 day. Take 1 tablet daily for 1 day   cetirizine 10 MG tablet Commonly known as: ZYRTEC Take 1 tablet (10 mg total) by mouth daily.   esomeprazole 40 MG capsule Commonly known as: NEXIUM Take 1 capsule (40 mg total) by mouth daily.   fluticasone 50 MCG/ACT nasal spray Commonly known as: FLONASE INSTILL 1 SPRAY IN EACH NOSTRIL DAILY ASNEEDED FOR SEASONAL ALLERGY SYMPTOMS   hydrocortisone 2.5 % rectal cream Commonly known as: ANUSOL-HC Apply 1 application topically as needed for hemorrhoids.   hydrOXYzine 25 MG tablet Commonly known as: ATARAX Take 25 mg by mouth 2 (two) times daily.   metoprolol succinate 50 MG 24 hr tablet Commonly known as: TOPROL-XL TAKE ONE TABLET BY MOUTH AT BEDTIME   morphine 15 MG 12 hr tablet Commonly known as: MS CONTIN Take 15 mg by mouth 3 (three) times daily.   Movantik 25 MG Tabs tablet Generic drug: naloxegol oxalate Take 25 mg by mouth daily.   Myrbetriq 50 MG Tb24 tablet Generic drug: mirabegron ER TAKE 1 TABLET BY MOUTH DAILY   Olopatadine HCl 0.2 % Soln Place 1 drop into both eyes at bedtime as needed (allergies).   omega-3 acid ethyl esters 1 g capsule Commonly known as: LOVAZA TAKE 2 CAPSULES BY MOUTH 2 TIMES DAILY   ondansetron 4 MG disintegrating tablet Commonly known as: ZOFRAN-ODT Take 1 tablet (4 mg total) by mouth every 8 (eight) hours as needed for nausea or vomiting.   Oxycodone HCl 10 MG Tabs Take 10 mg by  mouth 4 (four) times daily as needed.   predniSONE 20 MG tablet Commonly known as: DELTASONE Take 2 tablets (40 mg total) by mouth daily with breakfast for 5 days. Start taking on: March 18, 2022   pregabalin 100 MG capsule Commonly known as: LYRICA Take 100 mg by mouth 3 (three) times daily. What changed: Another medication with the same name was removed. Continue taking this medication, and follow the directions you see here.   rosuvastatin 40 MG tablet Commonly known as: CRESTOR TAKE 1 TABLET BY MOUTH DAILY   Symbicort 160-4.5 MCG/ACT inhaler Generic drug: budesonide-formoterol INHALE 2 PUFFS BY MOUTH TWICE DAILY (MAINTENANCE INHALER). RINSE MOUTH WELL AFTER USE TO PREVENT THRUSH.   Synthroid 88 MCG tablet Generic drug: levothyroxine Take 1 tablet (88 mcg total) by mouth daily before breakfast.   venlafaxine XR 150 MG 24 hr capsule Commonly known as: EFFEXOR-XR Take 1 capsule (150 mg total) by mouth every  evening. For mood control   ziprasidone 40 MG capsule Commonly known as: GEODON TAKE 1 CAPSULE BY MOUTH EVERY EVENING WITH FOOD FOR MOOD        No Known Allergies  Consultations:    Procedures/Studies: CT Angio Chest Pulmonary Embolism (PE) W or WO Contrast  Result Date: 03/14/2022 CLINICAL DATA:  Cough, shortness of breath, fever. Pulmonary embolism (PE) suspected, low to intermediate prob, positive D-dimer EXAM: CT ANGIOGRAPHY CHEST WITH CONTRAST TECHNIQUE: Multidetector CT imaging of the chest was performed using the standard protocol during bolus administration of intravenous contrast. Multiplanar CT image reconstructions and MIPs were obtained to evaluate the vascular anatomy. RADIATION DOSE REDUCTION: This exam was performed according to the departmental dose-optimization program which includes automated exposure control, adjustment of the mA and/or kV according to patient size and/or use of iterative reconstruction technique. CONTRAST:  43mL OMNIPAQUE IOHEXOL  350 MG/ML SOLN COMPARISON:  05/10/2021 FINDINGS: Cardiovascular: No filling defects in the pulmonary arteries to suggest pulmonary emboli. Heart is normal size. Aorta is normal caliber. Scattered coronary artery and aortic calcifications. Mediastinum/Nodes: No mediastinal, hilar, or axillary adenopathy. Trachea and esophagus are unremarkable. Thyroid unremarkable. Lungs/Pleura: No confluent opacities or effusions. Upper Abdomen: No acute findings Musculoskeletal: Chest wall soft tissues are unremarkable. No acute bony abnormality. Review of the MIP images confirms the above findings. IMPRESSION: No evidence of pulmonary embolus. No acute cardiopulmonary disease. Aortic Atherosclerosis (ICD10-I70.0). Electronically Signed   By: Rolm Baptise M.D.   On: 03/14/2022 23:03   DG Chest Portable 1 View  Result Date: 03/14/2022 CLINICAL DATA:  Cough, fever EXAM: PORTABLE CHEST 1 VIEW COMPARISON:  05/10/2021 FINDINGS: The heart size and mediastinal contours are within normal limits. Both lungs are clear. The visualized skeletal structures are unremarkable. IMPRESSION: No active disease. Electronically Signed   By: Rolm Baptise M.D.   On: 03/14/2022 19:01   (Echo, Carotid, EGD, Colonoscopy, ERCP)    Subjective: Pt c/o back pain. Pt has chronic pain    Discharge Exam: Vitals:   03/17/22 0815 03/17/22 1400  BP: 104/64   Pulse: 87   Resp: 17   Temp: 97.7 F (36.5 C)   SpO2: 92% 91%   Vitals:   03/17/22 0311 03/17/22 0343 03/17/22 0815 03/17/22 1400  BP:  114/71 104/64   Pulse:  85 87   Resp:  18 17   Temp:  98 F (36.7 C) 97.7 F (36.5 C)   TempSrc:  Oral Oral   SpO2: 96% 93% 92% 91%  Weight:      Height:        General: Pt is alert, awake, not in acute distress Cardiovascular: S1/S2 +, no rubs, no gallops Respiratory: decreased breath sounds b/l otherwise clear  Abdominal: Soft, NT, obese, bowel sounds + Extremities: no cyanosis    The results of significant diagnostics from this  hospitalization (including imaging, microbiology, ancillary and laboratory) are listed below for reference.     Microbiology: Recent Results (from the past 240 hour(s))  Resp Panel by RT-PCR (Flu A&B, Covid) Anterior Nasal Swab     Status: None   Collection Time: 03/14/22  6:11 PM   Specimen: Anterior Nasal Swab  Result Value Ref Range Status   SARS Coronavirus 2 by RT PCR NEGATIVE NEGATIVE Final    Comment: (NOTE) SARS-CoV-2 target nucleic acids are NOT DETECTED.  The SARS-CoV-2 RNA is generally detectable in upper respiratory specimens during the acute phase of infection. The lowest concentration of SARS-CoV-2 viral copies this assay can detect  is 138 copies/mL. A negative result does not preclude SARS-Cov-2 infection and should not be used as the sole basis for treatment or other patient management decisions. A negative result may occur with  improper specimen collection/handling, submission of specimen other than nasopharyngeal swab, presence of viral mutation(s) within the areas targeted by this assay, and inadequate number of viral copies(<138 copies/mL). A negative result must be combined with clinical observations, patient history, and epidemiological information. The expected result is Negative.  Fact Sheet for Patients:  EntrepreneurPulse.com.au  Fact Sheet for Healthcare Providers:  IncredibleEmployment.be  This test is no t yet approved or cleared by the [place] FDA and  has been authorized for detection and/or diagnosis of SARS-CoV-2 by FDA under an Emergency Use Authorization (EUA). This EUA will remain  in effect (meaning this test can be used) for the duration of the COVID-19 declaration under Section 564(b)(1) of the Act, 21 U.S.C.section 360bbb-3(b)(1), unless the authorization is terminated  or revoked sooner.       Influenza A by PCR NEGATIVE NEGATIVE Final   Influenza B by PCR NEGATIVE NEGATIVE Final    Comment:  (NOTE) The Xpert Xpress SARS-CoV-2/FLU/RSV plus assay is intended as an aid in the diagnosis of influenza from Nasopharyngeal swab specimens and should not be used as a sole basis for treatment. Nasal washings and aspirates are unacceptable for Xpert Xpress SARS-CoV-2/FLU/RSV testing.  Fact Sheet for Patients: EntrepreneurPulse.com.au  Fact Sheet for Healthcare Providers: IncredibleEmployment.be  This test is not yet approved or cleared by the [place] FDA and has been authorized for detection and/or diagnosis of SARS-CoV-2 by FDA under an Emergency Use Authorization (EUA). This EUA will remain in effect (meaning this test can be used) for the duration of the COVID-19 declaration under Section 564(b)(1) of the Act, 21 U.S.C. section 360bbb-3(b)(1), unless the authorization is terminated or revoked.  Performed at [facility], [address] [place], [place] [address]   Blood culture (routine x 2)     Status: None (Preliminary result)   Collection Time: 03/14/22  8:48 PM   Specimen: BLOOD RIGHT ARM  Result Value Ref Range Status   Specimen Description BLOOD RIGHT ARM  Final   Special Requests Blood Culture adequate volume  Final   Culture   Final    NO GROWTH 3 DAYS Performed at [facility], [address] [place], [place] [address]    Report Status PENDING  Incomplete  Blood culture (routine x 2)     Status: None (Preliminary result)   Collection Time: 03/14/22  8:48 PM   Specimen: BLOOD LEFT ARM  Result Value Ref Range Status   Specimen Description BLOOD LEFT ARM  Final   Special Requests   Final    Blood Culture results may not be optimal due to an inadequate volume of blood received in culture bottles   Culture   Final    NO GROWTH 3 DAYS Performed at [facility], [address] [place], [place] [address]    Report Status PENDING  Incomplete  Expectorated Sputum Assessment w Gram Stain, Rflx  to Resp Cult     Status: None   Collection Time: 03/15/22  5:40 AM   Specimen: Sputum  Result Value Ref Range Status   Specimen Description SPUTUM  Final   Special Requests NONE  Final   Sputum evaluation   Final    THIS SPECIMEN IS ACCEPTABLE FOR SPUTUM CULTURE Performed at [facility], [address] [place],  [place] [address]    Report Status 03/15/2022 FINAL  Final  Culture, Respiratory w Gram Stain     Status: None (Preliminary result)   Collection Time: 03/15/22  5:40 AM   Specimen: SPU  Result Value Ref Range Status   Specimen Description   Final    SPUTUM Performed at [facility], [address] [place], [place] [address]    Special Requests   Final    NONE Reflexed from [id number] Performed at [facility], [address] [place], [place] [address]    Gram Stain   Final    ABUNDANT WBC PRESENT, PREDOMINANTLY PMN FEW GRAM POSITIVE COCCI IN PAIRS RARE GRAM POSITIVE RODS    Culture   Final    CULTURE REINCUBATED FOR BETTER GROWTH Performed at [facility], [place] [address], [place], [place] [address]    Report Status PENDING  Incomplete     Labs: BNP (last 3 results) No results for input(s): "BNP" in the last 8760 hours. Basic Metabolic Panel: Recent Labs  Lab 03/14/22 1811 03/15/22 0540 03/16/22 0534 03/17/22 0433  NA 137 141 139 140  K 3.0* 3.4* 3.9 3.6  CL 98 111 106 105  CO2 27 27 29 30   GLUCOSE 153* 110* 147* 121*  BUN 9 6* 10 14  CREATININE 0.64 0.39* 0.45 0.47  CALCIUM 8.7* 7.1* 8.2* 8.1*  MG  --  1.7 2.0 2.0   Liver Function Tests: Recent Labs  Lab 03/14/22 1811 03/15/22 0540  AST 52* 34  ALT 34 23  ALKPHOS 85 58  BILITOT 1.6* 0.5  PROT 7.8 5.3*  ALBUMIN 3.4* 2.4*   No results for input(s): "LIPASE", "AMYLASE" in the last 168 hours. No results for input(s): "AMMONIA" in the last 168 hours. CBC: Recent Labs  Lab 03/14/22 1811 03/15/22 0540 03/16/22 0534 03/17/22 0433  WBC 7.9 5.3 5.1 6.2   NEUTROABS 6.9 4.4  --   --   HGB 16.8* 14.3 13.4 14.0  HCT 49.6* 44.4 40.5 43.3  MCV 91.5 95.3 93.5 94.5  PLT 127* 113* 112* 105*   Cardiac Enzymes: No results for input(s): "CKTOTAL", "CKMB", "CKMBINDEX", "TROPONINI" in the last 168 hours. BNP: Invalid input(s): "POCBNP" CBG: No results for input(s): "GLUCAP" in the last 168 hours. D-Dimer Recent Labs    03/14/22 2048  DDIMER 1.55*   Hgb A1c No results for input(s): "HGBA1C" in the last 72 hours. Lipid Profile No results for input(s): "CHOL", "HDL", "LDLCALC", "TRIG", "CHOLHDL", "LDLDIRECT" in the last 72 hours. Thyroid function studies Recent Labs    03/15/22 0540  TSH 0.285*   Anemia work up No results for input(s): "VITAMINB12", "FOLATE", "FERRITIN", "TIBC", "IRON", "RETICCTPCT" in the last 72 hours. Urinalysis    Component Value Date/Time   COLORURINE YELLOW (A) 03/14/2022 2220   APPEARANCEUR CLEAR (A) 03/14/2022 2220   APPEARANCEUR Clear 03/25/2014 1901   LABSPEC 1.015 03/14/2022 2220   LABSPEC 1.011 03/25/2014 1901   PHURINE 5.0 03/14/2022 2220   GLUCOSEU NEGATIVE 03/14/2022 2220   GLUCOSEU Negative 03/25/2014 1901   HGBUR MODERATE (A) 03/14/2022 2220   BILIRUBINUR NEGATIVE 03/14/2022 2220   BILIRUBINUR neg 08/08/2020 1140   BILIRUBINUR Negative 03/25/2014 1901   KETONESUR 5 (A) 03/14/2022 2220   PROTEINUR 30 (A) 03/14/2022 2220   UROBILINOGEN 0.2 08/08/2020 1140   NITRITE NEGATIVE 03/14/2022 2220   LEUKOCYTESUR NEGATIVE 03/14/2022 2220   LEUKOCYTESUR Negative 03/25/2014 1901   Sepsis Labs Recent Labs  Lab 03/14/22 1811 03/15/22 0540 03/16/22 0534  03/17/22 0433  WBC 7.9 5.3 5.1 6.2   Microbiology Recent Results (from the past 240 hour(s))  Resp Panel by RT-PCR (Flu A&B, Covid) Anterior Nasal Swab     Status: None   Collection Time: 03/14/22  6:11 PM   Specimen: Anterior Nasal Swab  Result Value Ref Range Status   SARS Coronavirus 2 by RT PCR NEGATIVE NEGATIVE Final    Comment:  (NOTE) SARS-CoV-2 target nucleic acids are NOT DETECTED.  The SARS-CoV-2 RNA is generally detectable in upper respiratory specimens during the acute phase of infection. The lowest concentration of SARS-CoV-2 viral copies this assay can detect is 138 copies/mL. A negative result does not preclude SARS-Cov-2 infection and should not be used as the sole basis for treatment or other patient management decisions. A negative result may occur with  improper specimen collection/handling, submission of specimen other than nasopharyngeal swab, presence of viral mutation(s) within the areas targeted by this assay, and inadequate number of viral copies(<138 copies/mL). A negative result must be combined with clinical observations, patient history, and epidemiological information. The expected result is Negative.  Fact Sheet for Patients:  EntrepreneurPulse.com.au  Fact Sheet for Healthcare Providers:  IncredibleEmployment.be  This test is no t yet approved or cleared by the [place] FDA and  has been authorized for detection and/or diagnosis of SARS-CoV-2 by FDA under an Emergency Use Authorization (EUA). This EUA will remain  in effect (meaning this test can be used) for the duration of the COVID-19 declaration under Section 564(b)(1) of the Act, 21 U.S.C.section 360bbb-3(b)(1), unless the authorization is terminated  or revoked sooner.       Influenza A by PCR NEGATIVE NEGATIVE Final   Influenza B by PCR NEGATIVE NEGATIVE Final    Comment: (NOTE) The Xpert Xpress SARS-CoV-2/FLU/RSV plus assay is intended as an aid in the diagnosis of influenza from Nasopharyngeal swab specimens and should not be used as a sole basis for treatment. Nasal washings and aspirates are unacceptable for Xpert Xpress SARS-CoV-2/FLU/RSV testing.  Fact Sheet for Patients: EntrepreneurPulse.com.au  Fact Sheet for Healthcare  Providers: IncredibleEmployment.be  This test is not yet approved or cleared by the [place] FDA and has been authorized for detection and/or diagnosis of SARS-CoV-2 by FDA under an Emergency Use Authorization (EUA). This EUA will remain in effect (meaning this test can be used) for the duration of the COVID-19 declaration under Section 564(b)(1) of the Act, 21 U.S.C. section 360bbb-3(b)(1), unless the authorization is terminated or revoked.  Performed at [facility], [address] [place], [place] [address]   Blood culture (routine x 2)     Status: None (Preliminary result)   Collection Time: 03/14/22  8:48 PM   Specimen: BLOOD RIGHT ARM  Result Value Ref Range Status   Specimen Description BLOOD RIGHT ARM  Final   Special Requests Blood Culture adequate volume  Final   Culture   Final    NO GROWTH 3 DAYS Performed at [facility], [address], [place], [place] [address]    Report Status PENDING  Incomplete  Blood culture (routine x 2)     Status: None (Preliminary result)   Collection Time: 03/14/22  8:48 PM   Specimen: BLOOD LEFT ARM  Result Value Ref Range Status   Specimen Description BLOOD LEFT ARM  Final   Special Requests   Final    Blood Culture results may not be optimal due to an inadequate volume of blood received in culture bottles   Culture  Final    NO GROWTH 3 DAYS Performed at [facility], [address] [place], [place] [address]    Report Status PENDING  Incomplete  Expectorated Sputum Assessment w Gram Stain, Rflx to Resp Cult     Status: None   Collection Time: 03/15/22  5:40 AM   Specimen: Sputum  Result Value Ref Range Status   Specimen Description SPUTUM  Final   Special Requests NONE  Final   Sputum evaluation   Final    THIS SPECIMEN IS ACCEPTABLE FOR SPUTUM CULTURE Performed at [facility], [address] [place], [place] [address]    Report Status 03/15/2022 FINAL   Final  Culture, Respiratory w Gram Stain     Status: None (Preliminary result)   Collection Time: 03/15/22  5:40 AM   Specimen: SPU  Result Value Ref Range Status   Specimen Description   Final    SPUTUM Performed at [facility], [address] [place], [place] [address]    Special Requests   Final    NONE Reflexed from [id number] Performed at [facility], [address] [place], [place] [address]    Gram Stain   Final    ABUNDANT WBC PRESENT, PREDOMINANTLY PMN FEW GRAM POSITIVE COCCI IN PAIRS RARE GRAM POSITIVE RODS    Culture   Final    CULTURE REINCUBATED FOR BETTER GROWTH Performed at [facility], [place] [address], [place], [place] [address]    Report Status PENDING  Incomplete     Time coordinating discharge: Over 30 minutes  SIGNED:   Wyvonnia Dusky, MD  [facility] 03/17/2022, 2:13 PM Pager   If 7PM-7AM, please contact night-coverage [email or web address]

## 2022-03-17 NOTE — Progress Notes (Signed)
Mobility Specialist - Progress Note  SpO2 Test Pre-mobility:SpO2 (93) During mobility: SpO2 (26,83,41) Post-mobility:SPO2 (90)     03/17/22 0952  Mobility  Activity Ambulated with assistance in hallway;Stood at bedside  Level of Assistance Standby assist, set-up cues, supervision of patient - no hands on  Assistive Device Front wheel walker  Distance Ambulated (ft) 80 ft  Activity Response Tolerated well  Mobility Referral Yes  $Mobility charge 1 Mobility   Pt resting in bed on 1L upon entry. Pt STS and ambulates in hallway around NS SBA. Pt began SpO2 test and STS to RW on 1L and dropped to 84%. Pt sat back down for a 2-3 minute seated rest break and SpO2 returned to 93%. Pt ambulates to chair outside room and stays consistently at 90%. Pt continues to ambulate to 67ft and drops to 88% and takes seated rest break for 2-3 minutes. Pt returns to bed and left with needs in reach and bed alarm activated.   Johnathan Hausen Mobility Specialist 03/17/22, 10:03 AM

## 2022-03-17 NOTE — Progress Notes (Deleted)
PROGRESS NOTE    April Soto  LHT:[id number] DOB: 07/16/1960 DOA: 03/14/2022 PCP: Lorre Munroe, NP    Assessment & Plan:   Principal Problem:   Acute respiratory failure with hypoxia (HCC) Active Problems:   Hypokalemia   GERD (gastroesophageal reflux disease)   Acquired hypothyroidism   COPD with acute exacerbation (HCC)   Lactic acidosis   Hypoalbuminemia   Respiratory failure (HCC)  Assessment and Plan: Acute hypoxic respiratory failure: CTA chest neg for PE or pneumonia. Found to be 84% on 4L [place]. Desaturated to 84% on 1L [place] w/ exertion. Continue on abxs, steroids, bronchodilators & encourage incentive spirometry. Continue on supplemental oxygen and wean as tolerated   COPD exacerbation: continue on azithromycin, steroids, bronchodilators & encourage incentive spirometry   Smoker: smoking cessation counseling x 5 mins. Quit smoking 4 days prior to admission as per pt   Thrombocytopenia: etiology unclear, labile      Hypoalbuminemia: encourage po intake    Lactic acidosis: resolved    Acquired hypothyroidism: continue on home dose of synthroid   GERD: continue on PPI    Hypokalemia: WNL today    Obesity: 33.2. Would benefit from weight loss        DVT prophylaxis:  heparin SQ Code Status: full  Family Communication:  Disposition Plan: likely d/c back home   Level of care: Telemetry Medical  Status is: Inpatient Remains inpatient appropriate because: severity of illness, still requiring oxygen      Consultants:    Procedures:   Antimicrobials: azithromycin    Subjective: Pt c/o back pain   Objective: Vitals:   03/16/22 1041 03/16/22 2206 03/17/22 0311 03/17/22 0343  BP:  120/65  114/71  Pulse:  95  85  Resp:  18  18  Temp:  97.7 F (36.5 C)  98 F (36.7 C)  TempSrc:    Oral  SpO2: 95% 100% 96% 93%  Weight:      Height:        Intake/Output Summary (Last 24 hours) at 03/17/2022 0753 Last data filed at 03/17/2022 0300 Gross per  24 hour  Intake 240 ml  Output 500 ml  Net -260 ml   Filed Weights   03/14/22 1814  Weight: 93.4 kg    Examination:  General exam: Appears comfortable. Obese   Respiratory system: diminished breath sounds b/l  Cardiovascular system: S1 & S2+. No rubs or clicks  Gastrointestinal system: abd is soft, NT, obese & hypoactive bowel sounds  Central nervous system: Alert and oriented. Moves all extremities  Psychiatry: judgement and insight appears at baseline. Flat mood and affect    Data Reviewed: I have personally reviewed following labs and imaging studies  CBC: Recent Labs  Lab 03/14/22 1811 03/15/22 0540 03/16/22 0534 03/17/22 0433  WBC 7.9 5.3 5.1 6.2  NEUTROABS 6.9 4.4  --   --   HGB 16.8* 14.3 13.4 14.0  HCT 49.6* 44.4 40.5 43.3  MCV 91.5 95.3 93.5 94.5  PLT 127* 113* 112* 105*   Basic Metabolic Panel: Recent Labs  Lab 03/14/22 1811 03/15/22 0540 03/16/22 0534 03/17/22 0433  NA 137 141 139 140  K 3.0* 3.4* 3.9 3.6  CL 98 111 106 105  CO2 27 27 29 30   GLUCOSE 153* 110* 147* 121*  BUN 9 6* 10 14  CREATININE 0.64 0.39* 0.45 0.47  CALCIUM 8.7* 7.1* 8.2* 8.1*  MG  --  1.7 2.0 2.0   GFR: Estimated Creatinine Clearance: 82.8 mL/min (by C-G formula  based on SCr of 0.47 mg/dL). Liver Function Tests: Recent Labs  Lab 03/14/22 1811 03/15/22 0540  AST 52* 34  ALT 34 23  ALKPHOS 85 58  BILITOT 1.6* 0.5  PROT 7.8 5.3*  ALBUMIN 3.4* 2.4*   No results for input(s): "LIPASE", "AMYLASE" in the last 168 hours. No results for input(s): "AMMONIA" in the last 168 hours. Coagulation Profile: No results for input(s): "INR", "PROTIME" in the last 168 hours. Cardiac Enzymes: No results for input(s): "CKTOTAL", "CKMB", "CKMBINDEX", "TROPONINI" in the last 168 hours. BNP (last 3 results) No results for input(s): "PROBNP" in the last 8760 hours. HbA1C: No results for input(s): "HGBA1C" in the last 72 hours. CBG: No results for input(s): "GLUCAP" in the last 168  hours. Lipid Profile: No results for input(s): "CHOL", "HDL", "LDLCALC", "TRIG", "CHOLHDL", "LDLDIRECT" in the last 72 hours. Thyroid Function Tests: Recent Labs    03/15/22 0540  TSH 0.285*   Anemia Panel: No results for input(s): "VITAMINB12", "FOLATE", "FERRITIN", "TIBC", "IRON", "RETICCTPCT" in the last 72 hours. Sepsis Labs: Recent Labs  Lab 03/14/22 1811 03/14/22 2048 03/15/22 1313  PROCALCITON 0.13  --   --   LATICACIDVEN 4.1* 2.9* 1.9    Recent Results (from the past 240 hour(s))  Resp Panel by RT-PCR (Flu A&B, Covid) Anterior Nasal Swab     Status: None   Collection Time: 03/14/22  6:11 PM   Specimen: Anterior Nasal Swab  Result Value Ref Range Status   SARS Coronavirus 2 by RT PCR NEGATIVE NEGATIVE Final    Comment: (NOTE) SARS-CoV-2 target nucleic acids are NOT DETECTED.  The SARS-CoV-2 RNA is generally detectable in upper respiratory specimens during the acute phase of infection. The lowest concentration of SARS-CoV-2 viral copies this assay can detect is 138 copies/mL. A negative result does not preclude SARS-Cov-2 infection and should not be used as the sole basis for treatment or other patient management decisions. A negative result may occur with  improper specimen collection/handling, submission of specimen other than nasopharyngeal swab, presence of viral mutation(s) within the areas targeted by this assay, and inadequate number of viral copies(<138 copies/mL). A negative result must be combined with clinical observations, patient history, and epidemiological information. The expected result is Negative.  Fact Sheet for Patients:  BloggerCourse.com  Fact Sheet for Healthcare Providers:  SeriousBroker.it  This test is no t yet approved or cleared by the [place] FDA and  has been authorized for detection and/or diagnosis of SARS-CoV-2 by FDA under an Emergency Use Authorization (EUA). This EUA  will remain  in effect (meaning this test can be used) for the duration of the COVID-19 declaration under Section 564(b)(1) of the Act, 21 U.S.C.section 360bbb-3(b)(1), unless the authorization is terminated  or revoked sooner.       Influenza A by PCR NEGATIVE NEGATIVE Final   Influenza B by PCR NEGATIVE NEGATIVE Final    Comment: (NOTE) The Xpert Xpress SARS-CoV-2/FLU/RSV plus assay is intended as an aid in the diagnosis of influenza from Nasopharyngeal swab specimens and should not be used as a sole basis for treatment. Nasal washings and aspirates are unacceptable for Xpert Xpress SARS-CoV-2/FLU/RSV testing.  Fact Sheet for Patients: BloggerCourse.com  Fact Sheet for Healthcare Providers: SeriousBroker.it  This test is not yet approved or cleared by the [place] FDA and has been authorized for detection and/or diagnosis of SARS-CoV-2 by FDA under an Emergency Use Authorization (EUA). This EUA will remain in effect (meaning this test can be used) for the duration  of the COVID-19 declaration under Section 564(b)(1) of the Act, 21 U.S.C. section 360bbb-3(b)(1), unless the authorization is terminated or revoked.  Performed at [facility], [address] [place], [place] [address]   Blood culture (routine x 2)     Status: None (Preliminary result)   Collection Time: 03/14/22  8:48 PM   Specimen: BLOOD RIGHT ARM  Result Value Ref Range Status   Specimen Description BLOOD RIGHT ARM  Final   Special Requests Blood Culture adequate volume  Final   Culture   Final    NO GROWTH 3 DAYS Performed at [facility], [address] [place], [place] [address]    Report Status PENDING  Incomplete  Blood culture (routine x 2)     Status: None (Preliminary result)   Collection Time: 03/14/22  8:48 PM   Specimen: BLOOD LEFT ARM  Result Value Ref Range Status   Specimen Description BLOOD LEFT ARM  Final    Special Requests   Final    Blood Culture results may not be optimal due to an inadequate volume of blood received in culture bottles   Culture   Final    NO GROWTH 3 DAYS Performed at [facility], [address], [place], [place] [address]    Report Status PENDING  Incomplete  Expectorated Sputum Assessment w Gram Stain, Rflx to Resp Cult     Status: None   Collection Time: 03/15/22  5:40 AM   Specimen: Sputum  Result Value Ref Range Status   Specimen Description SPUTUM  Final   Special Requests NONE  Final   Sputum evaluation   Final    THIS SPECIMEN IS ACCEPTABLE FOR SPUTUM CULTURE Performed at [facility], [address] [place], [place] [address]    Report Status 03/15/2022 FINAL  Final  Culture, Respiratory w Gram Stain     Status: None (Preliminary result)   Collection Time: 03/15/22  5:40 AM   Specimen: SPU  Result Value Ref Range Status   Specimen Description   Final    SPUTUM Performed at [facility], [address], [place], [place] [address]    Special Requests   Final    NONE Reflexed from [id number] Performed at [facility], [address] [place], [place] [address]    Gram Stain   Final    ABUNDANT WBC PRESENT, PREDOMINANTLY PMN FEW GRAM POSITIVE COCCI IN PAIRS RARE GRAM POSITIVE RODS    Culture   Final    CULTURE REINCUBATED FOR BETTER GROWTH Performed at [facility], [address] [place], [place] [address]    Report Status PENDING  Incomplete         Radiology Studies: No results found.      Scheduled Meds:  heparin  5,000 Units Subcutaneous Q8H   hydrOXYzine  25 mg Oral BID   ipratropium-albuterol  3 mL Nebulization Q6H   levothyroxine  88 mcg Oral Q0600   loratadine  10 mg Oral Daily   metoprolol succinate  50 mg Oral QHS   mirabegron ER  50 mg Oral Daily   mometasone-formoterol  2 puff Inhalation BID   morphine  15 mg Oral Q12H   pantoprazole  40 mg Oral Daily   predniSONE  40 mg  Oral Q breakfast   pregabalin  100 mg Oral TID   rosuvastatin  40 mg Oral Daily   venlafaxine XR  150 mg Oral QPM   ziprasidone  40 mg Oral  QHS   Continuous Infusions:  sodium chloride 10 mL/hr at 03/16/22 2105   azithromycin 500 mg (03/16/22 2107)     LOS: 2 days    Time spent: 28 mins    Charise KillianJamiese M Tashira Torre, MD [facility] Pager [phone]  If 7PM-7AM, please contact night-coverage [email or web address]  03/17/2022, 7:53 AM

## 2022-03-18 ENCOUNTER — Telehealth: Payer: Self-pay | Admitting: *Deleted

## 2022-03-18 LAB — CULTURE, RESPIRATORY W GRAM STAIN: Culture: NORMAL

## 2022-03-18 NOTE — Patient Outreach (Addendum)
  Care Coordination [facility] Note Transition Care Management Follow-up Telephone Call Date of discharge and from where: [facility] [phone] How have you been since you were released from the hospital? I am doing good Any questions or concerns? No  Items Reviewed: Did the pt receive and understand the discharge instructions provided? y Medications obtained and verified? Yes  Other? No  Any new allergies since your discharge? No  Dietary orders reviewed? No Do you have support at home? Yes   Home Care and Equipment/Supplies: Were home health services ordered? no If so, what is the name of the agency? N/a  Has the agency set up a time to come to the patient's home? not applicable Were any new equipment or medical supplies ordered?  No What is the name of the medical supply agency? N  Were you able to get the supplies/equipment? not applicable Do you have any questions related to the use of the equipment or supplies? No  Functional Questionnaire: (I = Independent and D = Dependent) ADLs: I   Bathing/Dressing- I   Meal Prep- I  Eating- I  Maintaining continence- I  Transferring/Ambulation- I  Managing Meds- I  Follow up appointments reviewed:  PCP Hospital f/u appt confirmed? Yes Nicki Reaper NP [phone] 2:40. Specialist Hospital f/u appt confirmed? No   Are transportation arrangements needed? No  If their condition worsens, is the pt aware to call PCP or go to the Emergency Dept.? Yes Was the patient provided with contact information for the PCP's office or ED? Yes Was to pt encouraged to call back with questions or concerns? Yes  SDOH assessments and interventions completed:   Yes  SDOH Screenings   Food Insecurity: No Food Insecurity (03/18/2022)  Housing: Low Risk  (03/18/2022)  Transportation Needs: No Transportation Needs (03/18/2022)  Utilities: Not At Risk (03/18/2022)  Alcohol Screen: Low Risk  (12/17/2021)  Depression (PHQ2-9): Low Risk  (12/17/2021)  Financial Resource  Strain: Low Risk  (08/09/2021)  Physical Activity: Unknown (08/09/2021)  Social Connections: Moderately Isolated (08/09/2021)  Stress: No Stress Concern Present (08/09/2021)  Tobacco Use: High Risk (03/14/2022)     Care Coordination Interventions:  No Care Coordination interventions needed at this time.  Patient declined Care Coordination for COPD Encounter Outcome:  Pt. Visit Completed   Gean Maidens BSN RN [facility] [phone]

## 2022-03-19 LAB — CULTURE, BLOOD (ROUTINE X 2)
Culture: NO GROWTH
Culture: NO GROWTH
Special Requests: ADEQUATE

## 2022-03-21 ENCOUNTER — Ambulatory Visit: Payer: Medicare Other | Admitting: Internal Medicine

## 2022-03-25 ENCOUNTER — Ambulatory Visit: Payer: Medicare Other | Admitting: Internal Medicine

## 2022-03-26 ENCOUNTER — Ambulatory Visit (INDEPENDENT_AMBULATORY_CARE_PROVIDER_SITE_OTHER): Payer: Medicare Other | Admitting: Internal Medicine

## 2022-03-26 ENCOUNTER — Encounter: Payer: Self-pay | Admitting: Internal Medicine

## 2022-03-26 DIAGNOSIS — E876 Hypokalemia: Secondary | ICD-10-CM

## 2022-03-26 DIAGNOSIS — R7989 Other specified abnormal findings of blood chemistry: Secondary | ICD-10-CM

## 2022-03-26 DIAGNOSIS — J9601 Acute respiratory failure with hypoxia: Secondary | ICD-10-CM | POA: Diagnosis not present

## 2022-03-26 DIAGNOSIS — R3129 Other microscopic hematuria: Secondary | ICD-10-CM | POA: Diagnosis not present

## 2022-03-26 DIAGNOSIS — J441 Chronic obstructive pulmonary disease with (acute) exacerbation: Secondary | ICD-10-CM | POA: Diagnosis not present

## 2022-03-26 NOTE — Patient Instructions (Signed)
COPD Exacerbation This video will teach you what types of triggers can make COPD worse, and how to avoid them. To view the content, go to this web address: [email or web address]  This video will expire on: 12/18/2023. If you need access to this video following this date, please reach out to the healthcare provider who assigned it to you. This information is not intended to replace advice given to you by your health care provider. Make sure you discuss any questions you have with your health care provider. Elsevier Patient Education  2023 Elsevier Inc.  

## 2022-03-26 NOTE — Progress Notes (Signed)
Virtual Visit via Telephone Note  I connected with April Soto on 03/26/22 at 11:20 AM EST by telephone and verified that I am speaking with the correct person using two identifiers.  Location: Patient: Home Provider: Office  Persons participating in this telephone call: Nicki Reaperegina Eryn Marandola, NP and April Soto   I discussed the limitations, risks, security and privacy concerns of performing an evaluation and management service by telephone and the availability of in person appointments. I also discussed with the patient that there may be a patient responsible charge related to this service. The patient expressed understanding and agreed to proceed.   History of Present Illness:  Patient due for hospital follow-up.  She presented to the ER 12/1 with complaint of headache, fatigue, cough, shortness of breath and chest pain.  Labs revealed hypokalemia, moderate blood in the urine, elevated AST, increased hemoglobin and hematocrit, low platelets and low TSH.  CT of the chest was negative for pneumonia or PE.  She was diagnosed with a COPD exacerbation.  She was treated with steroids, bronchodilators, azithromycin and supplemental oxygen.  She was discharged on 12/4.  Since that time, she reports she is feeling much better.  She still has a slight cough but denies chest pain or shortness of breath.  She is using Albuterol as needed.  They discontinued her Spiriva and switched her to Symbicort which she did pick up but she is not currently taking.  She denies urinary urgency, frequency, dysuria or blood in her urine.  She does have a history of kidney stones.  She is taking her Synthroid as prescribed.    Past Medical History:  Diagnosis Date   Anemia    "years ago"   Anxiety    takes Ativan daily   Asthma    Bipolar 1 disorder (HCC)    Chronic back pain    HNP   Constipation    takes Ra Col Rite daily   Degenerative disc disease    Depression    was on Prozac but taken off by MD for a  couple of weeks   Depression    Fibromyalgia    takes Lyrica daily   Genital herpes    GERD (gastroesophageal reflux disease)    takes Nexium daily   Headache(784.0)    Heart murmur    "grew out of"   History of bronchitis    last time 15+yrs ago   History of rheumatic fever    Hyperlipidemia    takes Simvastatin daily   Hypertension    takes Coreg daily   Hypothyroidism    takes Synthroid daily   Joint pain    Muscle spasm    takes Flexeril daily as needed   Nocturia    Osteoarthritis    Peripheral edema    takes HCTZ daily   Pneumonia    hx of;last time 30+yrs ago   Raynaud's disease    Rheumatic fever    as a child   Seasonal allergies    takes Zyrtec daily and uses Dymista as well   Stroke (HCC)    ? 5 years ago. No lasting deficits   Urinary incontinence    Weakness    nmbness and tingling-right leg    Current Outpatient Medications  Medication Sig Dispense Refill   acyclovir (ZOVIRAX) 400 MG tablet TAKE 1 TABLET BY MOUTH DAILY 90 tablet 0   albuterol (VENTOLIN HFA) 108 (90 Base) MCG/ACT inhaler Inhale 2 puffs into the lungs every 4 (four) hours  as needed for wheezing or shortness of breath.      cetirizine (ZYRTEC) 10 MG tablet Take 1 tablet (10 mg total) by mouth daily. 90 tablet 1   esomeprazole (NEXIUM) 40 MG capsule Take 1 capsule (40 mg total) by mouth daily. 90 capsule 1   fluticasone (FLONASE) 50 MCG/ACT nasal spray INSTILL 1 SPRAY IN EACH NOSTRIL DAILY ASNEEDED FOR SEASONAL ALLERGY SYMPTOMS 16 g 0   guaiFENesin (MUCINEX) 600 MG 12 hr tablet Take 1 tablet (600 mg total) by mouth 2 (two) times daily for 10 days. 20 tablet 0   hydrocortisone (ANUSOL-HC) 2.5 % rectal cream Apply 1 application topically as needed for hemorrhoids.      hydrOXYzine (ATARAX) 25 MG tablet Take 25 mg by mouth 2 (two) times daily.     metoprolol succinate (TOPROL-XL) 50 MG 24 hr tablet TAKE ONE TABLET BY MOUTH AT BEDTIME 90 tablet 0   morphine (MS CONTIN) 15 MG 12 hr tablet Take  15 mg by mouth 3 (three) times daily.     MOVANTIK 25 MG TABS tablet Take 25 mg by mouth daily.     MYRBETRIQ 50 MG TB24 tablet TAKE 1 TABLET BY MOUTH DAILY 90 tablet 0   Olopatadine HCl 0.2 % SOLN Place 1 drop into both eyes at bedtime as needed (allergies).     omega-3 acid ethyl esters (LOVAZA) 1 g capsule TAKE 2 CAPSULES BY MOUTH 2 TIMES DAILY 180 capsule 1   ondansetron (ZOFRAN-ODT) 4 MG disintegrating tablet Take 1 tablet (4 mg total) by mouth every 8 (eight) hours as needed for nausea or vomiting. 20 tablet 0   Oxycodone HCl 10 MG TABS Take 10 mg by mouth 4 (four) times daily as needed.     pregabalin (LYRICA) 100 MG capsule Take 100 mg by mouth 3 (three) times daily.     rosuvastatin (CRESTOR) 40 MG tablet TAKE 1 TABLET BY MOUTH DAILY 90 tablet 0   SYMBICORT 160-4.5 MCG/ACT inhaler INHALE 2 PUFFS BY MOUTH TWICE DAILY (MAINTENANCE INHALER). RINSE MOUTH WELL AFTER USE TO PREVENT THRUSH. 10.2 g 5   SYNTHROID 88 MCG tablet Take 1 tablet (88 mcg total) by mouth daily before breakfast. 90 tablet 0   venlafaxine XR (EFFEXOR-XR) 150 MG 24 hr capsule Take 1 capsule (150 mg total) by mouth every evening. For mood control 30 capsule 0   ziprasidone (GEODON) 40 MG capsule TAKE 1 CAPSULE BY MOUTH EVERY EVENING WITH FOOD FOR MOOD 90 capsule 0   No current facility-administered medications for this visit.    No Known Allergies  Family History  Problem Relation Age of Onset   Diabetes type II Mother    Hypertension Mother    Diabetes Mother    Diabetes Sister    Hypertension Sister    Hypertension Maternal Grandmother    Diabetes Maternal Grandmother    Breast cancer Neg Hx     Social History   Socioeconomic History   Marital status: Divorced    Spouse name: Not on file   Number of children: Not on file   Years of education: Not on file   Highest education level: Not on file  Occupational History   Not on file  Tobacco Use   Smoking status: Some Days    Packs/day: 1.00    Years:  40.00    Total pack years: 40.00    Types: Cigarettes   Smokeless tobacco: Never  Vaping Use   Vaping Use: Never used  Substance and Sexual  Activity   Alcohol use: No   Drug use: No   Sexual activity: Not Currently    Birth control/protection: Surgical  Other Topics Concern   Not on file  Social History Narrative   Not on file   Social Determinants of Health   Financial Resource Strain: Low Risk  (08/09/2021)   Overall Financial Resource Strain (CARDIA)    Difficulty of Paying Living Expenses: Not hard at all  Food Insecurity: No Food Insecurity (03/18/2022)   Hunger Vital Sign    Worried About Running Out of Food in the Last Year: Never true    Ran Out of Food in the Last Year: Never true  Transportation Needs: No Transportation Needs (03/18/2022)   PRAPARE - Administrator, Civil Service (Medical): No    Lack of Transportation (Non-Medical): No  Physical Activity: Unknown (08/09/2021)   Exercise Vital Sign    Days of Exercise per Week: 0 days    Minutes of Exercise per Session: Patient refused  Stress: No Stress Concern Present (08/09/2021)   [employer] of Occupational Health - Occupational Stress Questionnaire    Feeling of Stress : Not at all  Social Connections: Moderately Isolated (08/09/2021)   Social Connection and Isolation Panel [NHANES]    Frequency of Communication with Friends and Family: More than three times a week    Frequency of Social Gatherings with Friends and Family: Twice a week    Attends Religious Services: More than 4 times per year    Active Member of [employer] or Organizations: No    Attends Banker Meetings: Never    Marital Status: Divorced  Catering manager Violence: Not At Risk (03/15/2022)   Humiliation, Afraid, Rape, and Kick questionnaire    Fear of Current or Ex-Partner: No    Emotionally Abused: No    Physically Abused: No    Sexually Abused: No     Constitutional: Denies fever, malaise, fatigue, headache or  abrupt weight changes.  HEENT: Denies eye pain, eye redness, ear pain, ringing in the ears, wax buildup, runny nose, nasal congestion, bloody nose, or sore throat. Respiratory: Patient reports cough.  Denies difficulty breathing, shortness of breath, or sputum production.   Cardiovascular: Denies chest pain, chest tightness, palpitations or swelling in the hands or feet.  Gastrointestinal: Denies abdominal pain, bloating, constipation, diarrhea or blood in the stool.  GU: Denies urgency, frequency, pain with urination, burning sensation, blood in urine, odor or discharge.   No other specific complaints in a complete review of systems (except as listed in HPI above).  Observations/Objective:   Wt Readings from Last 3 Encounters:  03/14/22 206 lb (93.4 kg)  12/17/21 206 lb (93.4 kg)  09/02/21 187 lb 3.2 oz (84.9 kg)    General: In NAD. Pulmonary/Chest: No respiratory distress.  Neurological: Alert and oriented.   BMET    Component Value Date/Time   NA 140 03/17/2022 0433   NA 140 03/30/2014 2045   K 3.6 03/17/2022 0433   K 3.7 03/30/2014 2045   CL 105 03/17/2022 0433   CL 106 03/30/2014 2045   CO2 30 03/17/2022 0433   CO2 28 03/30/2014 2045   GLUCOSE 121 (H) 03/17/2022 0433   GLUCOSE 96 03/30/2014 2045   BUN 14 03/17/2022 0433   BUN 7 03/30/2014 2045   CREATININE 0.47 03/17/2022 0433   CREATININE 0.68 12/17/2021 1526   CALCIUM 8.1 (L) 03/17/2022 0433   CALCIUM 8.7 03/30/2014 2045   GFRNONAA >60 03/17/2022 4854  GFRNONAA 102 08/08/2020 1124   GFRAA 118 08/08/2020 1124    Lipid Panel     Component Value Date/Time   CHOL 129 12/17/2021 1526   TRIG 169 (H) 12/17/2021 1526   HDL 35 (L) 12/17/2021 1526   CHOLHDL 3.7 12/17/2021 1526   LDLCALC 69 12/17/2021 1526    CBC    Component Value Date/Time   WBC 6.2 03/17/2022 0433   RBC 4.58 03/17/2022 0433   HGB 14.0 03/17/2022 0433   HGB 15.5 03/30/2014 2045   HCT 43.3 03/17/2022 0433   HCT 47.0 03/30/2014 2045    PLT 105 (L) 03/17/2022 0433   PLT 174 03/30/2014 2045   MCV 94.5 03/17/2022 0433   MCV 92 03/30/2014 2045   MCH 30.6 03/17/2022 0433   MCHC 32.3 03/17/2022 0433   RDW 13.7 03/17/2022 0433   RDW 15.4 (H) 03/30/2014 2045   LYMPHSABS 0.4 (L) 03/15/2022 0540   MONOABS 0.5 03/15/2022 0540   EOSABS 0.0 03/15/2022 0540   BASOSABS 0.0 03/15/2022 0540    Hgb A1C Lab Results  Component Value Date   HGBA1C 5.3 06/13/2021       Assessment and Plan: Hospital Follow-Up for COPD Exacerbation:  Hospital notes, labs and imaging reviewed Advised her to start using Symbicort as prescribed Encouraged her to use Albuterol only as needed She can take Delsym OTC as needed for cough Advised her we would follow-up on her hypokalemia, blood in her urine and low TSH at her next appointment  RTC in 3 months for follow-up of chronic conditions Follow Up Instructions:    I discussed the assessment and treatment plan with the patient. The patient was provided an opportunity to ask questions and all were answered. The patient agreed with the plan and demonstrated an understanding of the instructions.   The patient was advised to call back or seek an in-person evaluation if the symptoms worsen or if the condition fails to improve as anticipated.  I provided 12:15 minutes of non-face-to-face time during this encounter.   Nicki Reaper, NP

## 2022-03-28 ENCOUNTER — Other Ambulatory Visit: Payer: Self-pay | Admitting: Internal Medicine

## 2022-03-28 DIAGNOSIS — E785 Hyperlipidemia, unspecified: Secondary | ICD-10-CM

## 2022-03-28 DIAGNOSIS — I10 Essential (primary) hypertension: Secondary | ICD-10-CM

## 2022-03-28 NOTE — Telephone Encounter (Signed)
Requested Prescriptions  Pending Prescriptions Disp Refills   rosuvastatin (CRESTOR) 40 MG tablet [Pharmacy Med Name: ROSUVASTATIN CALCIUM 40 MG TAB] 90 tablet 0    Sig: TAKE 1 TABLET BY MOUTH DAILY     Cardiovascular:  Antilipid - Statins 2 Failed - 03/28/2022 11:07 AM      Failed - Lipid Panel in normal range within the last 12 months    Cholesterol  Date Value Ref Range Status  12/17/2021 129 <200 mg/dL Final   LDL Cholesterol (Calc)  Date Value Ref Range Status  12/17/2021 69 mg/dL (calc) Final    Comment:    Reference range: <100 . Desirable range <100 mg/dL for primary prevention;   <70 mg/dL for patients with CHD or diabetic patients  with > or = 2 CHD risk factors. Marland Kitchen LDL-C is now calculated using the Martin-Hopkins  calculation, which is a validated novel method providing  better accuracy than the Friedewald equation in the  estimation of LDL-C.  Cresenciano Genre et al. Annamaria Helling. [id number]): 2061-2068  (http://education.[facility].com/faq/FAQ164)    HDL  Date Value Ref Range Status  12/17/2021 35 (L) > OR = 50 mg/dL Final   Triglycerides  Date Value Ref Range Status  12/17/2021 169 (H) <150 mg/dL Final         Passed - Cr in normal range and within 360 days    Creat  Date Value Ref Range Status  12/17/2021 0.68 0.50 - 1.05 mg/dL Final   Creatinine, Ser  Date Value Ref Range Status  03/17/2022 0.47 0.44 - 1.00 mg/dL Final         Passed - Patient is not pregnant      Passed - Valid encounter within last 12 months    Recent Outpatient Visits           2 days ago Acute respiratory failure with hypoxia [facility])   [facility], Coralie Keens, NP   3 months ago Need for influenza vaccination   [facility] [place], Coralie Keens, NP   6 months ago OAB (overactive bladder)   [facility] Olin Hauser, DO   9 months ago Acquired hypothyroidism   [facility] [place], [place] W, NP   1 year ago  Acute cystitis with hematuria   [place] J, DO               metoprolol succinate (TOPROL-XL) 50 MG 24 hr tablet [Pharmacy Med Name: METOPROLOL SUCCINATE ER 50 MG TAB] 90 tablet 0    Sig: TAKE 1 TABLET BY MOUTH AT BEDTIME     Cardiovascular:  Beta Blockers Passed - 03/28/2022 11:07 AM      Passed - Last BP in normal range    BP Readings from Last 1 Encounters:  03/17/22 104/64         Passed - Last Heart Rate in normal range    Pulse Readings from Last 1 Encounters:  03/17/22 87         Passed - Valid encounter within last 6 months    Recent Outpatient Visits           2 days ago Acute respiratory failure with hypoxia [facility])   [facility] [place], Coralie Keens, NP   3 months ago Need for influenza vaccination   [facility] [place], Coralie Keens, NP   6 months ago OAB (overactive bladder)   [facility] [place], She[place] Coil  J, DO   9 months ago Acquired hypothyroidism   [facility] [place], Salvadore Oxford, NP   1 year ago Acute cystitis with hematuria   [facility] [place], Netta Neat, [place]

## 2022-03-28 NOTE — Telephone Encounter (Signed)
Requested by interface surescripts. Course of 10 days completed.  Requested Prescriptions  Refused Prescriptions Disp Refills   MUCINEX 600 MG 12 hr tablet [Pharmacy Med Name: MUCINEX 600 MG TAB]      Sig: TAKE 1 TABLET BY MOUTH TWICE A DAY FOR 10 DAYS     Over the Counter:  OTC Passed - 03/28/2022 11:31 AM      Passed - Valid encounter within last 12 months    Recent Outpatient Visits           2 days ago Acute respiratory failure with hypoxia [facility])   [facility], Salvadore Oxford, NP   3 months ago Need for influenza vaccination   [facility] [place], Salvadore Oxford, NP   6 months ago OAB (overactive bladder)   [facility] Smitty Cords, DO   9 months ago Acquired hypothyroidism   [facility] [place], Salvadore Oxford, NP   1 year ago Acute cystitis with hematuria   [facility] [place], Netta Neat, DO

## 2022-04-08 ENCOUNTER — Other Ambulatory Visit: Payer: Self-pay | Admitting: Internal Medicine

## 2022-04-08 DIAGNOSIS — A6000 Herpesviral infection of urogenital system, unspecified: Secondary | ICD-10-CM

## 2022-04-09 NOTE — Telephone Encounter (Signed)
Requested Prescriptions  Pending Prescriptions Disp Refills   acyclovir (ZOVIRAX) 400 MG tablet [Pharmacy Med Name: ACYCLOVIR 400 MG TAB] 90 tablet 0    Sig: TAKE 1 TABLET BY MOUTH DAILY     Antimicrobials:  Antiviral Agents - Anti-Herpetic Passed - 04/08/2022  2:13 PM      Passed - Valid encounter within last 12 months    Recent Outpatient Visits           2 weeks ago Acute respiratory failure with hypoxia [facility])   [facility], Salvadore Oxford, NP   3 months ago Need for influenza vaccination   [facility] [place], Salvadore Oxford, NP   7 months ago OAB (overactive bladder)   [facility] Smitty Cords, DO   10 months ago Acquired hypothyroidism   [facility] [place], Salvadore Oxford, NP   1 year ago Acute cystitis with hematuria   [facility] [place], Netta Neat, DO

## 2022-04-24 DIAGNOSIS — F17210 Nicotine dependence, cigarettes, uncomplicated: Secondary | ICD-10-CM | POA: Diagnosis not present

## 2022-04-24 DIAGNOSIS — Z79891 Long term (current) use of opiate analgesic: Secondary | ICD-10-CM | POA: Diagnosis not present

## 2022-04-24 DIAGNOSIS — K5903 Drug induced constipation: Secondary | ICD-10-CM | POA: Diagnosis not present

## 2022-04-24 DIAGNOSIS — G894 Chronic pain syndrome: Secondary | ICD-10-CM | POA: Diagnosis not present

## 2022-04-24 DIAGNOSIS — M542 Cervicalgia: Secondary | ICD-10-CM | POA: Diagnosis not present

## 2022-05-17 IMAGING — DX DG CHEST 1V PORT
1 series · 1 of 1 positions shown · non-contrast
Comparison: 08/01/2020

CLINICAL DATA: Questionable sepsis - evaluate for abnormality

EXAM:
PORTABLE CHEST 1 VIEW

[chest ap]
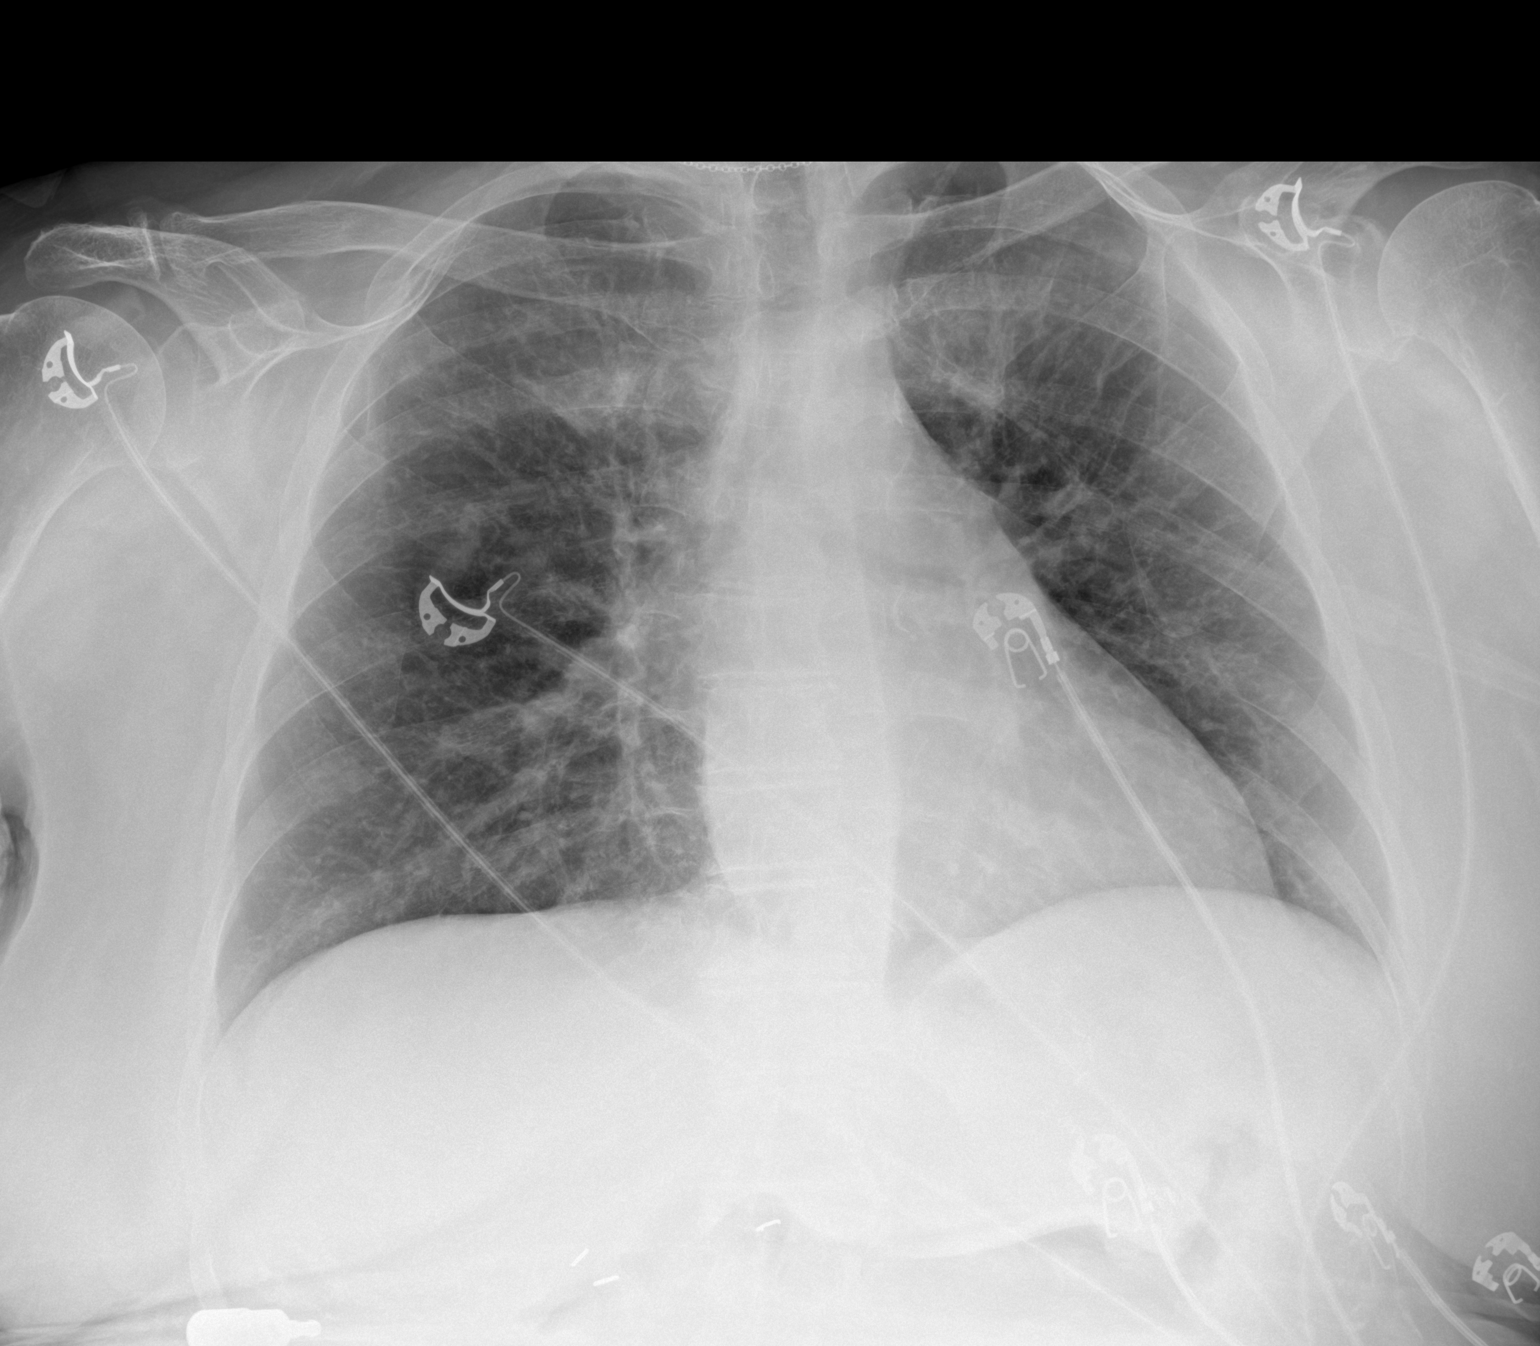

[1 of 1 positions shown; findings below may reference images not displayed]

FINDINGS: Chronic interstitial changes. There may be minimal patchy
superimposed density. No pleural effusion or pneumothorax. Normal
heart size.
IMPRESSION: Possible minimal patchy atelectasis or ground-glass density
superimposed on chronic interstitial changes.

## 2022-05-29 ENCOUNTER — Other Ambulatory Visit: Payer: Self-pay | Admitting: Internal Medicine

## 2022-05-29 DIAGNOSIS — A6000 Herpesviral infection of urogenital system, unspecified: Secondary | ICD-10-CM

## 2022-05-29 NOTE — Telephone Encounter (Signed)
Last RF 04/09/22 #90  Requested Prescriptions  Refused Prescriptions Disp Refills   acyclovir (ZOVIRAX) 400 MG tablet [Pharmacy Med Name: ACYCLOVIR 400 MG TAB] 90 tablet 0    Sig: TAKE 1 TABLET BY MOUTH DAILY     Antimicrobials:  Antiviral Agents - Anti-Herpetic Passed - 05/29/2022  4:19 PM      Passed - Valid encounter within last 12 months    Recent Outpatient Visits           2 months ago Acute respiratory failure with hypoxia [facility])   [facility] [place], Coralie Keens, NP   5 months ago Need for influenza vaccination   [facility] [place], Coralie Keens, NP   8 months ago OAB (overactive bladder)   [place], DO   11 months ago Acquired hypothyroidism   [facility] [place], Coralie Keens, NP   1 year ago Acute cystitis with hematuria   [place], [place]

## 2022-05-29 NOTE — Telephone Encounter (Signed)
Requested by interface surescripts. Medication discontinued 03/17/22. Requested Prescriptions  Refused Prescriptions Disp Refills   diclofenac (VOLTAREN) 75 MG EC tablet [Pharmacy Med Name: DICLOFENAC SODIUM 75 MG DR TAB] 180 tablet 1    Sig: TAKE 1 TABLET BY MOUTH TWICE A DAY     Analgesics:  NSAIDS Failed - 05/29/2022  4:34 PM      Failed - Manual Review: Labs are only required if the patient has taken medication for more than 8 weeks.      Failed - PLT in normal range and within 360 days    Platelets  Date Value Ref Range Status  03/17/2022 105 (L) 150 - 400 K/uL Final   Platelet  Date Value Ref Range Status  03/30/2014 174 150 - 440 x10 3/mm 3 Final         Passed - Cr in normal range and within 360 days    Creat  Date Value Ref Range Status  12/17/2021 0.68 0.50 - 1.05 mg/dL Final   Creatinine, Ser  Date Value Ref Range Status  03/17/2022 0.47 0.44 - 1.00 mg/dL Final         Passed - HGB in normal range and within 360 days    Hemoglobin  Date Value Ref Range Status  03/17/2022 14.0 12.0 - 15.0 g/dL Final   HGB  Date Value Ref Range Status  03/30/2014 15.5 12.0 - 16.0 g/dL Final         Passed - HCT in normal range and within 360 days    HCT  Date Value Ref Range Status  03/17/2022 43.3 36.0 - 46.0 % Final  03/30/2014 47.0 35.0 - 47.0 % Final         Passed - eGFR is 30 or above and within 360 days    GFR, Est African American  Date Value Ref Range Status  08/08/2020 118 > OR = 60 mL/min/1.71m Final   GFR, Est Non African American  Date Value Ref Range Status  08/08/2020 102 > OR = 60 mL/min/1.75mFinal   GFR, Estimated  Date Value Ref Range Status  03/17/2022 >60 >60 mL/min Final    Comment:    (NOTE) Calculated using the CKD-EPI Creatinine Equation (2021)    eGFR  Date Value Ref Range Status  12/17/2021 98 > OR = 60 mL/min/1.7327minal         Passed - Patient is not pregnant      Passed - Valid encounter within last 12 months    Recent  Outpatient Visits           2 months ago [facility]   5 months ago Need for influenza vaccination   [facility]   8 months ago OAB (overactive bladder)   [facility]   11 months ago Acquired hypothyroidism   [facility]   1 year ago Acute cystitis with hematuria   [place]

## 2022-06-19 DIAGNOSIS — M542 Cervicalgia: Secondary | ICD-10-CM | POA: Diagnosis not present

## 2022-06-19 DIAGNOSIS — G894 Chronic pain syndrome: Secondary | ICD-10-CM | POA: Diagnosis not present

## 2022-06-19 DIAGNOSIS — K5903 Drug induced constipation: Secondary | ICD-10-CM | POA: Diagnosis not present

## 2022-06-19 DIAGNOSIS — F17210 Nicotine dependence, cigarettes, uncomplicated: Secondary | ICD-10-CM | POA: Diagnosis not present

## 2022-06-19 DIAGNOSIS — Z79891 Long term (current) use of opiate analgesic: Secondary | ICD-10-CM | POA: Diagnosis not present

## 2022-06-26 ENCOUNTER — Encounter: Payer: Self-pay | Admitting: Internal Medicine

## 2022-06-27 ENCOUNTER — Other Ambulatory Visit: Payer: Self-pay

## 2022-06-27 DIAGNOSIS — E785 Hyperlipidemia, unspecified: Secondary | ICD-10-CM

## 2022-06-27 DIAGNOSIS — I10 Essential (primary) hypertension: Secondary | ICD-10-CM

## 2022-06-27 DIAGNOSIS — A6000 Herpesviral infection of urogenital system, unspecified: Secondary | ICD-10-CM

## 2022-06-27 DIAGNOSIS — K219 Gastro-esophageal reflux disease without esophagitis: Secondary | ICD-10-CM

## 2022-06-30 ENCOUNTER — Other Ambulatory Visit: Payer: Self-pay | Admitting: Internal Medicine

## 2022-06-30 DIAGNOSIS — I10 Essential (primary) hypertension: Secondary | ICD-10-CM

## 2022-06-30 DIAGNOSIS — K219 Gastro-esophageal reflux disease without esophagitis: Secondary | ICD-10-CM

## 2022-06-30 DIAGNOSIS — E785 Hyperlipidemia, unspecified: Secondary | ICD-10-CM

## 2022-06-30 DIAGNOSIS — A6000 Herpesviral infection of urogenital system, unspecified: Secondary | ICD-10-CM

## 2022-06-30 NOTE — Telephone Encounter (Signed)
Pt called in to schedule an appt. Appt is 07/03/22. Pt is wanting to see if medication can be called into the pharmacy.

## 2022-07-01 NOTE — Telephone Encounter (Signed)
Requested medication (s) are due for refill today: yes  Requested medication (s) are on the active medication list: yes    Last refill:  Acyclovir 12/27/3 #90  0 refills        Metoprolol and Rosuvastatin 03/28/22 #90  0 refills    Esomeprazole  06/13/21  #90  1 refill  Future visit scheduled Unsure  Notes to clinic: Appt for 07/03/22 still noted but appears to have been cancelled by pt. Please review. Thank you  Requested Prescriptions  Pending Prescriptions Disp Refills   acyclovir (ZOVIRAX) 400 MG tablet [Pharmacy Med Name: ACYCLOVIR 400 MG TAB] 90 tablet 0    Sig: TAKE 1 TABLET BY MOUTH DAILY     Antimicrobials:  Antiviral Agents - Anti-Herpetic Passed - 06/30/2022 12:35 PM      Passed - Valid encounter within last 12 months    Recent Outpatient Visits           3 months ago Acute respiratory failure with hypoxia [facility])   [facility] [place], Coralie Keens, NP   6 months ago Need for influenza vaccination   [facility] [place], Coralie Keens, NP   10 months ago OAB (overactive bladder)   [place], [place], DO   1 year ago Acquired hypothyroidism   [facility] [place], Coralie Keens, NP   1 year ago Acute cystitis with hematuria   [place], DO       Future Appointments             In 2 days Garnette Gunner, Coralie Keens, NP [facility]             metoprolol succinate (TOPROL-XL) 50 MG 24 hr tablet [Pharmacy Med Name: METOPROLOL SUCCINATE ER 50 MG TAB] 90 tablet 0    Sig: TAKE 1 TABLET BY MOUTH AT BEDTIME     Cardiovascular:  Beta Blockers Failed - 06/30/2022 12:35 PM      Failed - Valid encounter within last 6 months    Recent Outpatient Visits           3 months ago Acute respiratory failure with hypoxia [facility])   [facility] [place], Coralie Keens, NP   6 months  ago Need for influenza vaccination   [facility] [place], Coralie Keens, NP   10 months ago OAB (overactive bladder)   [place], [place], DO   1 year ago Acquired hypothyroidism   [facility] [place], Coralie Keens, NP   1 year ago Acute cystitis with hematuria   [place], DO       Future Appointments             In 2 days Garnette Gunner, Coralie Keens, NP [facility] [place] BP in normal range    BP Readings from Last 1 Encounters:  03/17/22 104/64         Passed - Last Heart Rate in normal range    Pulse Readings from Last 1 Encounters:  03/17/22 87          rosuvastatin (CRESTOR) 40 MG tablet [Pharmacy Med Name: ROSUVASTATIN CALCIUM 40 MG  TAB] 90 tablet 0    Sig: TAKE 1 TABLET BY MOUTH DAILY     Cardiovascular:  Antilipid - Statins 2 Failed - 06/30/2022 12:35 PM      Failed - Lipid Panel in normal range within the last 12 months    Cholesterol  Date Value Ref Range Status  12/17/2021 129 <200 mg/dL Final   LDL Cholesterol (Calc)  Date Value Ref Range Status  12/17/2021 69 mg/dL (calc) Final    Comment:    Reference range: <100 . Desirable range <100 mg/dL for primary prevention;   <70 mg/dL for patients with CHD or diabetic patients  with > or = 2 CHD risk factors. Marland Kitchen LDL-C is now calculated using the Martin-Hopkins  calculation, which is a validated novel method providing  better accuracy than the Friedewald equation in the  estimation of LDL-C.  Cresenciano Genre et al. Annamaria Helling. [id number]): 2061-2068  (http://education.[facility].com/faq/FAQ164)    HDL  Date Value Ref Range Status  12/17/2021 35 (L) > OR = 50 mg/dL Final   Triglycerides  Date Value Ref Range Status  12/17/2021 169 (H) <150 mg/dL Final         Passed - Cr in normal range and within 360 days    Creat   Date Value Ref Range Status  12/17/2021 0.68 0.50 - 1.05 mg/dL Final   Creatinine, Ser  Date Value Ref Range Status  03/17/2022 0.47 0.44 - 1.00 mg/dL Final         Passed - Patient is not pregnant      Passed - Valid encounter within last 12 months    Recent Outpatient Visits           3 months ago Acute respiratory failure with hypoxia [facility])   [facility] [place], Coralie Keens, NP   6 months ago Need for influenza vaccination   [facility] [place], Coralie Keens, NP   10 months ago OAB (overactive bladder)   [place], [place], DO   1 year ago Acquired hypothyroidism   [facility] [place], Coralie Keens, NP   1 year ago Acute cystitis with hematuria   [place], Devonne Doughty, DO       Future Appointments             In 2 days Garnette Gunner, Coralie Keens, NP [facility]             esomeprazole (NEXIUM) 40 MG capsule [Pharmacy Med Name: ESOMEPRAZOLE MAGNESIUM 40 MG CAP] 90 capsule 1    Sig: TAKE 1 CAPSULE BY MOUTH DAILY     Gastroenterology: Proton Pump Inhibitors 2 Passed - 06/30/2022 12:35 PM      Passed - ALT in normal range and within 360 days    ALT  Date Value Ref Range Status  03/15/2022 23 0 - 44 U/L Final   SGPT (ALT)  Date Value Ref Range Status  03/30/2014 20 U/L Final    Comment:    14-63 NOTE: New Reference Range 11/01/13          Passed - AST in normal range and within 360 days    AST  Date Value Ref Range Status  03/15/2022 34 15 - 41 U/L Final   SGOT(AST)  Date Value Ref Range Status  03/30/2014 18 15 - 37 Unit/L Final  Passed - Valid encounter within last 12 months    Recent Outpatient Visits           3 months ago Acute respiratory failure with hypoxia [facility])   [facility] [place], Coralie Keens, NP   6 months ago Need for  influenza vaccination   [facility] [place], Coralie Keens, NP   10 months ago OAB (overactive bladder)   [place], [place], DO   1 year ago Acquired hypothyroidism   [facility] [place], Coralie Keens, NP   1 year ago Acute cystitis with hematuria   [facility] [place], Devonne Doughty, DO       Future Appointments             In 2 days Garnette Gunner, Coralie Keens, NP [facility] [place] 18 MCG inhalation capsule [Pharmacy Med Name: SPIRIVA HANDIHALER 18 MCG INH CAP] 90 capsule 1    Sig: PLACE 1 CAPSULE (18 MCG TOTAL) INTO INHALER AND INHALE DAILY     Pulmonology:  Anticholinergic Agents Passed - 06/30/2022 12:35 PM      Passed - Valid encounter within last 12 months    Recent Outpatient Visits           3 months ago Acute respiratory failure with hypoxia [facility])   [facility] [place], Coralie Keens, NP   6 months ago Need for influenza vaccination   [facility] [place], Coralie Keens, NP   10 months ago OAB (overactive bladder)   [place], [place], DO   1 year ago Acquired hypothyroidism   [facility] [place], Coralie Keens, NP   1 year ago Acute cystitis with hematuria   [place], DO       Future Appointments             In 2 days [place], Coralie Keens, NP [facility], [facility]

## 2022-07-01 NOTE — Telephone Encounter (Signed)
Requested Prescriptions  Pending Prescriptions Disp Refills   SPIRIVA HANDIHALER 18 MCG inhalation capsule [Pharmacy Med Name: SPIRIVA HANDIHALER 18 MCG INH CAP] 90 capsule 1    Sig: PLACE 1 CAPSULE (18 MCG TOTAL) INTO INHALER AND INHALE DAILY     Pulmonology:  Anticholinergic Agents Passed - 06/30/2022 12:35 PM      Passed - Valid encounter within last 12 months    Recent Outpatient Visits           3 months ago Acute respiratory failure with [facility])   [facility] [place], Coralie Keens, NP   6 months ago Need for influenza vaccination   [facility] [place], Coralie Keens, NP   10 months ago OAB (overactive bladder)   [place], [place], DO   1 year ago Acquired hypothyroidism   [facility] [place], Coralie Keens, NP   1 year ago Acute cystitis with hematuria   [place], DO       Future Appointments             In 2 days Garnette Gunner, Coralie Keens, NP [facility], [place]             acyclovir (ZOVIRAX) 400 MG tablet [Pharmacy Med Name: ACYCLOVIR 400 MG TAB] 90 tablet 0    Sig: TAKE 1 TABLET BY MOUTH DAILY     Antimicrobials:  Antiviral Agents - Anti-Herpetic Passed - 06/30/2022 12:35 PM      Passed - Valid encounter within last 12 months    Recent Outpatient Visits           3 months ago Acute respiratory failure with hypoxia [facility])   [facility] [place], Coralie Keens, NP   6 months ago Need for influenza vaccination   [facility] [place], Coralie Keens, NP   10 months ago OAB (overactive bladder)   [place], [place], DO   1 year ago Acquired hypothyroidism   [facility] [place], Coralie Keens, NP   1 year ago Acute cystitis with hematuria   [place], DO       Future Appointments             In 2 days Jearld Fenton, NP [facility]             metoprolol succinate (TOPROL-XL) 50 MG 24 hr tablet [Pharmacy Med Name: METOPROLOL SUCCINATE ER 50 MG TAB] 90 tablet 0    Sig: TAKE 1 TABLET BY MOUTH AT BEDTIME     Cardiovascular:  Beta Blockers Failed - 06/30/2022 12:35 PM      Failed - Valid encounter within last 6 months    Recent Outpatient Visits           3 months ago Acute respiratory failure with hypoxia [facility])   [facility] [place], Coralie Keens, NP   6 months ago Need for influenza vaccination   [facility] [place], Coralie Keens, NP   10 months ago OAB (overactive bladder)   [place], DO   1 year ago Acquired hypothyroidism   [facility]  Jearld Fenton, NP   1 year ago Acute cystitis with hematuria   [place], DO       Future Appointments             In 2 days Garnette Gunner, Coralie Keens, NP [facility] [place] BP in normal range    BP Readings from Last 1 Encounters:  03/17/22 104/64         Passed - Last Heart Rate in normal range    Pulse Readings from Last 1 Encounters:  03/17/22 87          rosuvastatin (CRESTOR) 40 MG tablet [Pharmacy Med Name: ROSUVASTATIN CALCIUM 40 MG TAB] 90 tablet 0    Sig: TAKE 1 TABLET BY MOUTH DAILY     Cardiovascular:  Antilipid - Statins 2 Failed - 06/30/2022 12:35 PM      Failed - Lipid Panel in normal range within the last 12 months    Cholesterol  Date Value Ref Range Status  12/17/2021 129 <200 mg/dL Final   LDL Cholesterol (Calc)  Date Value Ref Range Status  12/17/2021 69 mg/dL (calc) Final    Comment:    Reference range: <100 . Desirable range <100 mg/dL for primary  prevention;   <70 mg/dL for patients with CHD or diabetic patients  with > or = 2 CHD risk factors. Marland Kitchen LDL-C is now calculated using the Martin-Hopkins  calculation, which is a validated novel method providing  better accuracy than the Friedewald equation in the  estimation of LDL-C.  Cresenciano Genre et al. Annamaria Helling. [id number]): 2061-2068  (http://education.[facility].com/faq/FAQ164)    HDL  Date Value Ref Range Status  12/17/2021 35 (L) > OR = 50 mg/dL Final   Triglycerides  Date Value Ref Range Status  12/17/2021 169 (H) <150 mg/dL Final         Passed - Cr in normal range and within 360 days    Creat  Date Value Ref Range Status  12/17/2021 0.68 0.50 - 1.05 mg/dL Final   Creatinine, Ser  Date Value Ref Range Status  03/17/2022 0.47 0.44 - 1.00 mg/dL Final         Passed - Patient is not pregnant      Passed - Valid encounter within last 12 months    Recent Outpatient Visits           3 months ago Acute respiratory failure with hypoxia [facility])   [facility] [place], Coralie Keens, NP   6 months ago Need for influenza vaccination   [facility] [place], Coralie Keens, NP   10 months ago OAB (overactive bladder)   [place], [place], DO   1 year ago Acquired hypothyroidism   [facility] [place], Coralie Keens, NP   1 year ago Acute cystitis with hematuria   [place], DO       Future Appointments             In 2 days Garnette Gunner, Coralie Keens, NP [facility], [facility]             esomeprazole (NEXIUM) 40 MG capsule [Pharmacy Med Name: ESOMEPRAZOLE MAGNESIUM 40 MG CAP] 90 capsule 1    Sig: TAKE 1 CAPSULE  BY MOUTH DAILY     Gastroenterology: Proton Pump Inhibitors 2 Passed - 06/30/2022 12:35 PM      Passed - ALT in normal range and within 360 days    ALT  Date Value Ref Range Status   03/15/2022 23 0 - 44 U/L Final   SGPT (ALT)  Date Value Ref Range Status  03/30/2014 20 U/L Final    Comment:    14-63 NOTE: New Reference Range 11/01/13          Passed - AST in normal range and within 360 days    AST  Date Value Ref Range Status  03/15/2022 34 15 - 41 U/L Final   SGOT(AST)  Date Value Ref Range Status  03/30/2014 18 15 - 37 Unit/L Final         Passed - Valid encounter within last 12 months    Recent Outpatient Visits           3 months ago Acute respiratory failure with hypoxia [facility])   [facility] [place], Coralie Keens, NP   6 months ago Need for influenza vaccination   [facility] [place], Coralie Keens, NP   10 months ago OAB (overactive bladder)   [place], DO   1 year ago Acquired hypothyroidism   [facility] [place], Coralie Keens, NP   1 year ago Acute cystitis with hematuria   [place], DO       Future Appointments             In 2 days Baity, Coralie Keens, NP [facility], [facility]

## 2022-07-03 ENCOUNTER — Ambulatory Visit: Payer: 59 | Admitting: Internal Medicine

## 2022-07-03 ENCOUNTER — Encounter: Payer: Self-pay | Admitting: Internal Medicine

## 2022-07-03 ENCOUNTER — Ambulatory Visit (INDEPENDENT_AMBULATORY_CARE_PROVIDER_SITE_OTHER): Payer: 59 | Admitting: Internal Medicine

## 2022-07-03 VITALS — BP 100/74 | HR 85 | Wt 196.2 lb

## 2022-07-03 DIAGNOSIS — J410 Simple chronic bronchitis: Secondary | ICD-10-CM

## 2022-07-03 DIAGNOSIS — M159 Polyosteoarthritis, unspecified: Secondary | ICD-10-CM | POA: Diagnosis not present

## 2022-07-03 DIAGNOSIS — J4540 Moderate persistent asthma, uncomplicated: Secondary | ICD-10-CM | POA: Diagnosis not present

## 2022-07-03 DIAGNOSIS — M797 Fibromyalgia: Secondary | ICD-10-CM

## 2022-07-03 DIAGNOSIS — K219 Gastro-esophageal reflux disease without esophagitis: Secondary | ICD-10-CM

## 2022-07-03 DIAGNOSIS — I700 Atherosclerosis of aorta: Secondary | ICD-10-CM

## 2022-07-03 DIAGNOSIS — E039 Hypothyroidism, unspecified: Secondary | ICD-10-CM | POA: Diagnosis not present

## 2022-07-03 DIAGNOSIS — F332 Major depressive disorder, recurrent severe without psychotic features: Secondary | ICD-10-CM

## 2022-07-03 DIAGNOSIS — N3281 Overactive bladder: Secondary | ICD-10-CM

## 2022-07-03 DIAGNOSIS — G894 Chronic pain syndrome: Secondary | ICD-10-CM

## 2022-07-03 DIAGNOSIS — D696 Thrombocytopenia, unspecified: Secondary | ICD-10-CM

## 2022-07-03 DIAGNOSIS — M5441 Lumbago with sciatica, right side: Secondary | ICD-10-CM | POA: Diagnosis not present

## 2022-07-03 DIAGNOSIS — R739 Hyperglycemia, unspecified: Secondary | ICD-10-CM

## 2022-07-03 DIAGNOSIS — E6609 Other obesity due to excess calories: Secondary | ICD-10-CM

## 2022-07-03 DIAGNOSIS — A6004 Herpesviral vulvovaginitis: Secondary | ICD-10-CM

## 2022-07-03 DIAGNOSIS — E782 Mixed hyperlipidemia: Secondary | ICD-10-CM | POA: Diagnosis not present

## 2022-07-03 DIAGNOSIS — K5909 Other constipation: Secondary | ICD-10-CM | POA: Diagnosis not present

## 2022-07-03 DIAGNOSIS — I10 Essential (primary) hypertension: Secondary | ICD-10-CM

## 2022-07-03 DIAGNOSIS — Z6831 Body mass index (BMI) 31.0-31.9, adult: Secondary | ICD-10-CM

## 2022-07-03 DIAGNOSIS — M150 Primary generalized (osteo)arthritis: Secondary | ICD-10-CM

## 2022-07-03 DIAGNOSIS — R7309 Other abnormal glucose: Secondary | ICD-10-CM

## 2022-07-03 DIAGNOSIS — G8929 Other chronic pain: Secondary | ICD-10-CM

## 2022-07-03 MED ORDER — ASPIRIN 81 MG PO TBEC: 81.0000 mg | DELAYED_RELEASE_TABLET | Freq: Every day | ORAL | 12 refills | Status: AC

## 2022-07-03 MED ORDER — DICLOFENAC SODIUM 75 MG PO TBEC: 75.0000 mg | DELAYED_RELEASE_TABLET | Freq: Two times a day (BID) | ORAL | 1 refills | Status: DC

## 2022-07-03 NOTE — Assessment & Plan Note (Signed)
Encourage high-fiber diet and adequate water intake Continue Movantik

## 2022-07-03 NOTE — Patient Instructions (Signed)

## 2022-07-03 NOTE — Assessment & Plan Note (Signed)
Continue diclofenac, MS Contin, oxycodone and pregabalin prescribed by pain management

## 2022-07-03 NOTE — Assessment & Plan Note (Signed)
C-Met and lipid profile today Encouraged her to consume low-fat diet Continue rosuvastatin and Lovaza Will have her add baby aspirin daily

## 2022-07-03 NOTE — Progress Notes (Signed)
Subjective:    Patient ID: April Soto, female    DOB: 1961-03-01, 62 y.o.   MRN: [id number]  HPI  Patient presents to clinic today for follow-up of chronic conditions.  HTN: Her BP today is 100/74.  She is taking Metoprolol as prescribed.  ECG from 03/2022 reviewed.  Asthma/COPD: Moderate, persistent. She denies cough or shortness of breath. Managed on Symbicort and Albuterol.  There are no PFTs on file.  She does not follow with pulmonology.  Hypothyroidism: She denies any issues on her current dose of Synthroid.  She does not follow with endocrinology.  Chronic Low Back Pain/OA. Fibromyalgia: Managed with MS Contin, Oxycodone, and Pregabalin.  She has been taking Diclofenac although it is not on her med list, and would like this refilled today she follows with pain management.  Genital Herpes: She reports recent outbreak.  She takes Acyclovir as prescribed.  GERD: She is not sure what triggers.  She takes Esomeprazole only as needed.  There is no upper GI on file.  Chronic Constipation: Managed with Movantik.  There is no colonoscopy on file.  She does not follow with GI.  HLD with Aortic Atherosclerosis: Her last LDL was 69, triglycerides 169, 12/2021.  She denies myalgias on Rosuvastatin and Lovaza. She is not currently taking Aspiring.  She does not consume low-fat diet.  Depression: Chronic, managed on Venlafaxine, Hydroxyzine and Geodon as prescribed.  She is currently seeing a therapist and psychiatrist.  She denies anxiety, SI/HI.  Thrombocytopenia: Her last platelet count was 105, 03/2022.  She does not follow with hematology.  OAB: She reports mainly urgency and urge incontinence.  She is taking Myrbetriq as prescribed.  She does not follow with urology.  Review of Systems     Past Medical History:  Diagnosis Date   Anemia    "years ago"   Anxiety    takes Ativan daily   Asthma    Bipolar 1 disorder (HCC)    Chronic back pain    HNP   Constipation    takes  Ra Col Rite daily   Degenerative disc disease    Depression    was on Prozac but taken off by MD for a couple of weeks   Depression    Fibromyalgia    takes Lyrica daily   Genital herpes    GERD (gastroesophageal reflux disease)    takes Nexium daily   Headache(784.0)    Heart murmur    "grew out of"   History of bronchitis    last time 15+yrs ago   History of rheumatic fever    Hyperlipidemia    takes Simvastatin daily   Hypertension    takes Coreg daily   Hypothyroidism    takes Synthroid daily   Joint pain    Muscle spasm    takes Flexeril daily as needed   Nocturia    Osteoarthritis    Peripheral edema    takes HCTZ daily   Pneumonia    hx of;last time 30+yrs ago   Raynaud's disease    Rheumatic fever    as a child   Seasonal allergies    takes Zyrtec daily and uses Dymista as well   Stroke ([place])    ? 5 years ago. No lasting deficits   Urinary incontinence    Weakness    nmbness and tingling-right leg    Current Outpatient Medications  Medication Sig Dispense Refill   acyclovir (ZOVIRAX) 400 MG tablet TAKE 1 TABLET BY  MOUTH DAILY 90 tablet 0   albuterol (VENTOLIN HFA) 108 (90 Base) MCG/ACT inhaler Inhale 2 puffs into the lungs every 4 (four) hours as needed for wheezing or shortness of breath.      cetirizine (ZYRTEC) 10 MG tablet Take 1 tablet (10 mg total) by mouth daily. 90 tablet 1   esomeprazole (NEXIUM) 40 MG capsule TAKE 1 CAPSULE BY MOUTH DAILY 90 capsule 1   fluticasone (FLONASE) 50 MCG/ACT nasal spray INSTILL 1 SPRAY IN EACH NOSTRIL DAILY ASNEEDED FOR SEASONAL ALLERGY SYMPTOMS 16 g 0   hydrocortisone (ANUSOL-HC) 2.5 % rectal cream Apply 1 application topically as needed for hemorrhoids.      hydrOXYzine (ATARAX) 25 MG tablet Take 25 mg by mouth 2 (two) times daily.     metoprolol succinate (TOPROL-XL) 50 MG 24 hr tablet TAKE 1 TABLET BY MOUTH AT BEDTIME 90 tablet 0   morphine (MS CONTIN) 15 MG 12 hr tablet Take 15 mg by mouth 3 (three) times daily.      MOVANTIK 25 MG TABS tablet Take 25 mg by mouth daily.     MYRBETRIQ 50 MG TB24 tablet TAKE 1 TABLET BY MOUTH DAILY 90 tablet 0   Olopatadine HCl 0.2 % SOLN Place 1 drop into both eyes at bedtime as needed (allergies).     omega-3 acid ethyl esters (LOVAZA) 1 g capsule TAKE 2 CAPSULES BY MOUTH 2 TIMES DAILY 180 capsule 1   ondansetron (ZOFRAN-ODT) 4 MG disintegrating tablet Take 1 tablet (4 mg total) by mouth every 8 (eight) hours as needed for nausea or vomiting. 20 tablet 0   Oxycodone HCl 10 MG TABS Take 10 mg by mouth 4 (four) times daily as needed.     pregabalin (LYRICA) 100 MG capsule Take 100 mg by mouth 3 (three) times daily.     rosuvastatin (CRESTOR) 40 MG tablet TAKE 1 TABLET BY MOUTH DAILY 90 tablet 0   SYMBICORT 160-4.5 MCG/ACT inhaler INHALE 2 PUFFS BY MOUTH TWICE DAILY (MAINTENANCE INHALER). RINSE MOUTH WELL AFTER USE TO PREVENT THRUSH. 10.2 g 5   SYNTHROID 88 MCG tablet Take 1 tablet (88 mcg total) by mouth daily before breakfast. 90 tablet 0   venlafaxine XR (EFFEXOR-XR) 150 MG 24 hr capsule Take 1 capsule (150 mg total) by mouth every evening. For mood control 30 capsule 0   ziprasidone (GEODON) 40 MG capsule TAKE 1 CAPSULE BY MOUTH EVERY EVENING WITH FOOD FOR MOOD 90 capsule 0   No current facility-administered medications for this visit.    No Known Allergies  Family History  Problem Relation Age of Onset   Diabetes type II Mother    Hypertension Mother    Diabetes Mother    Diabetes Sister    Hypertension Sister    Hypertension Maternal Grandmother    Diabetes Maternal Grandmother    Breast cancer Neg Hx     Social History   Socioeconomic History   Marital status: Divorced    Spouse name: Not on file   Number of children: Not on file   Years of education: Not on file   Highest education level: Not on file  Occupational History   Not on file  Tobacco Use   Smoking status: Some Days    Packs/day: 1.00    Years: 40.00    Additional pack years: 0.00     Total pack years: 40.00    Types: Cigarettes   Smokeless tobacco: Never  Vaping Use   Vaping Use: Never used  Substance  and Sexual Activity   Alcohol use: No   Drug use: No   Sexual activity: Not Currently    Birth control/protection: Surgical  Other Topics Concern   Not on file  Social History Narrative   Not on file   Social Determinants of Health   Financial Resource Strain: Low Risk  (08/09/2021)   Overall Financial Resource Strain (CARDIA)    Difficulty of Paying Living Expenses: Not hard at all  Food Insecurity: No Food Insecurity (03/18/2022)   Hunger Vital Sign    Worried About Running Out of Food in the Last Year: Never true    Ran Out of Food in the Last Year: Never true  Transportation Needs: No Transportation Needs (03/18/2022)   PRAPARE - Hydrologist (Medical): No    Lack of Transportation (Non-Medical): No  Physical Activity: Unknown (08/09/2021)   Exercise Vital Sign    Days of Exercise per Week: 0 days    Minutes of Exercise per Session: Patient declined  Stress: No Stress Concern Present (08/09/2021)   [place]    Feeling of Stress : Not at all  Social Connections: Moderately Isolated (08/09/2021)   Social Connection and Isolation Panel [NHANES]    Frequency of Communication with Friends and Family: More than three times a week    Frequency of Social Gatherings with Friends and Family: Twice a week    Attends Religious Services: More than 4 times per year    Active Member of [employer] or Organizations: No    Attends Archivist Meetings: Never    Marital Status: Divorced  Human resources officer Violence: Not At Risk (03/15/2022)   Humiliation, Afraid, Rape, and Kick questionnaire    Fear of Current or Ex-Partner: No    Emotionally Abused: No    Physically Abused: No    Sexually Abused: No     Constitutional: Denies fever, malaise, fatigue, headache or abrupt  weight changes.  HEENT: Denies eye pain, eye redness, ear pain, ringing in the ears, wax buildup, runny nose, nasal congestion, bloody nose, or sore throat. Respiratory: Denies difficulty breathing, shortness of breath, cough or sputum production.   Cardiovascular: Denies chest pain, chest tightness, palpitations or swelling in the hands or feet.  Gastrointestinal: Patient reports chronic constipation.  Denies abdominal pain, bloating, diarrhea or blood in the stool.  GU: Denies urgency, frequency, pain with urination, burning sensation, blood in urine, odor or discharge. Musculoskeletal: Patient reports chronic joint, muscle pain, difficulty with gait.  Denies decrease in range of motion, or joint swelling.  Skin: Denies redness, rashes, lesions or ulcercations.  Neurological: Denies dizziness, difficulty with memory, difficulty with speech or problems with balance and coordination.  Psych: Patient has a history of depression.  Denies anxiety, SI/HI.  No other specific complaints in a complete review of systems (except as listed in HPI above).  Objective:   Physical Exam   BP 100/74 (BP Location: Left Arm, Patient Position: Sitting)   Pulse 85   Wt 196 lb 3.2 oz (89 kg)   SpO2 (!) 87%   BMI 31.67 kg/m   Wt Readings from Last 3 Encounters:  03/14/22 206 lb (93.4 kg)  12/17/21 206 lb (93.4 kg)  09/02/21 187 lb 3.2 oz (84.9 kg)    General: Appears older than her stated age, chronically ill-appearing in NAD. Skin: Warm, dry and intact.  HEENT: Head: normal shape and size; Eyes: sclera white, no icterus, conjunctiva pink,  PERRLA and EOMs intact;  Neck:  Neck supple, trachea midline. No masses, lumps or thyromegaly present.  Cardiovascular: Normal rate and rhythm. S1,S2 noted.  No murmur, rubs or gallops noted. No JVD or BLE edema. No carotid bruits noted.  Blue discoloration of bilateral feet without ulceration. Pulmonary/Chest: Normal effort and positive vesicular breath sounds. No  respiratory distress. No wheezes, rales or ronchi noted.  Abdomen: Soft and nontender. Normal bowel sounds.  Musculoskeletal: She is using a walker that is too low for her.  Gait slow and steady Neurological: Alert and oriented. Cranial nerves II-XII grossly intact. Coordination normal.  Psychiatric: Mood and affect flat. Behavior is normal. Judgment and thought content normal.     BMET    Component Value Date/Time   NA 140 03/17/2022 0433   NA 140 03/30/2014 2045   K 3.6 03/17/2022 0433   K 3.7 03/30/2014 2045   CL 105 03/17/2022 0433   CL 106 03/30/2014 2045   CO2 30 03/17/2022 0433   CO2 28 03/30/2014 2045   GLUCOSE 121 (H) 03/17/2022 0433   GLUCOSE 96 03/30/2014 2045   BUN 14 03/17/2022 0433   BUN 7 03/30/2014 2045   CREATININE 0.47 03/17/2022 0433   CREATININE 0.68 12/17/2021 1526   CALCIUM 8.1 (L) 03/17/2022 0433   CALCIUM 8.7 03/30/2014 2045   GFRNONAA >60 03/17/2022 0433   GFRNONAA 102 08/08/2020 1124   GFRAA 118 08/08/2020 1124    Lipid Panel     Component Value Date/Time   CHOL 129 12/17/2021 1526   TRIG 169 (H) 12/17/2021 1526   HDL 35 (L) 12/17/2021 1526   CHOLHDL 3.7 12/17/2021 1526   LDLCALC 69 12/17/2021 1526    CBC    Component Value Date/Time   WBC 6.2 03/17/2022 0433   RBC 4.58 03/17/2022 0433   HGB 14.0 03/17/2022 0433   HGB 15.5 03/30/2014 2045   HCT 43.3 03/17/2022 0433   HCT 47.0 03/30/2014 2045   PLT 105 (L) 03/17/2022 0433   PLT 174 03/30/2014 2045   MCV 94.5 03/17/2022 0433   MCV 92 03/30/2014 2045   MCH 30.6 03/17/2022 0433   MCHC 32.3 03/17/2022 0433   RDW 13.7 03/17/2022 0433   RDW 15.4 (H) 03/30/2014 2045   LYMPHSABS 0.4 (L) 03/15/2022 0540   MONOABS 0.5 03/15/2022 0540   EOSABS 0.0 03/15/2022 0540   BASOSABS 0.0 03/15/2022 0540    Hgb A1C Lab Results  Component Value Date   HGBA1C 5.3 06/13/2021          Assessment & Plan:     RTC in 6 months for your annual exam Webb Silversmith, NP

## 2022-07-03 NOTE — Assessment & Plan Note (Signed)
Continue Myrbetriq 

## 2022-07-03 NOTE — Assessment & Plan Note (Signed)
Continue Symbicort and albuterol Encourage smoking cessation

## 2022-07-03 NOTE — Assessment & Plan Note (Signed)
Encourage diet for weight loss 

## 2022-07-03 NOTE — Assessment & Plan Note (Signed)
C-Met and lipid profile today Encouraged her to consume low-fat diet Continue rosuvastatin 

## 2022-07-03 NOTE — Assessment & Plan Note (Signed)
CBC today.  

## 2022-07-03 NOTE — Assessment & Plan Note (Signed)
TSH and free T4 today We will adjust Synthroid if needed based on labs

## 2022-07-03 NOTE — Assessment & Plan Note (Signed)
Continue metoprolol. 

## 2022-07-03 NOTE — Assessment & Plan Note (Signed)
Continue suppressive acyclovir

## 2022-07-03 NOTE — Assessment & Plan Note (Signed)
Try to identify and avoid triggers Encourage weight loss as this can help reduce reflux symptoms Continue esomeprazole

## 2022-07-03 NOTE — Assessment & Plan Note (Signed)
Stable on Effexor, hydroxyzine and Geodon prescribed by psychiatry

## 2022-07-04 LAB — COMPLETE METABOLIC PANEL WITH GFR
AG Ratio: 1.2 (calc) (ref 1.0–2.5)
ALT: 25 U/L (ref 6–29)
AST: 37 U/L — ABNORMAL HIGH (ref 10–35)
Albumin: 3.7 g/dL (ref 3.6–5.1)
Alkaline phosphatase (APISO): 127 U/L (ref 37–153)
BUN: 12 mg/dL (ref 7–25)
CO2: 28 mmol/L (ref 20–32)
Calcium: 9.2 mg/dL (ref 8.6–10.4)
Chloride: 98 mmol/L (ref 98–110)
Creat: 0.69 mg/dL (ref 0.50–1.05)
Globulin: 3 g/dL (calc) (ref 1.9–3.7)
Glucose, Bld: 112 mg/dL — ABNORMAL HIGH (ref 65–99)
Potassium: 4.7 mmol/L (ref 3.5–5.3)
Sodium: 140 mmol/L (ref 135–146)
Total Bilirubin: 0.5 mg/dL (ref 0.2–1.2)
Total Protein: 6.7 g/dL (ref 6.1–8.1)
eGFR: 97 mL/min/{1.73_m2} (ref >=60)

## 2022-07-04 LAB — HEMOGLOBIN A1C
Hgb A1c MFr Bld: 6.1 % of total Hgb — ABNORMAL HIGH (ref <5.7)
Mean Plasma Glucose: 128 mg/dL
eAG (mmol/L): 7.1 mmol/L

## 2022-07-04 LAB — CBC
HCT: 52.8 % — ABNORMAL HIGH (ref 35.0–45.0)
Hemoglobin: 17.6 g/dL — ABNORMAL HIGH (ref 11.7–15.5)
MCH: 30 pg (ref 27.0–33.0)
MCHC: 33.3 g/dL (ref 32.0–36.0)
MCV: 90.1 fL (ref 80.0–100.0)
MPV: 11.5 fL (ref 7.5–12.5)
Platelets: 209 10*3/uL (ref 140–400)
RBC: 5.86 10*6/uL — ABNORMAL HIGH (ref 3.80–5.10)
RDW: 12.6 % (ref 11.0–15.0)
WBC: 9.4 10*3/uL (ref 3.8–10.8)

## 2022-07-04 LAB — LIPID PANEL
Cholesterol: 150 mg/dL (ref <200)
HDL: 43 mg/dL — ABNORMAL LOW (ref >=50)
LDL Cholesterol (Calc): 84 mg/dL (calc)
Non-HDL Cholesterol (Calc): 107 mg/dL (calc) (ref <130)
Total CHOL/HDL Ratio: 3.5 (calc) (ref <5.0)
Triglycerides: 134 mg/dL (ref <150)

## 2022-07-04 LAB — T4, FREE: Free T4: 1.2 ng/dL (ref 0.8–1.8)

## 2022-07-04 LAB — TSH: TSH: 4.64 mIU/L — ABNORMAL HIGH (ref 0.40–4.50)

## 2022-07-07 ENCOUNTER — Ambulatory Visit: Payer: Self-pay

## 2022-07-07 NOTE — Telephone Encounter (Signed)
  Chief Complaint: results Symptoms: NA Frequency: today Pertinent Negatives: Patient has tried Chantix and Wellbutrin in past Disposition: [] ED /[] Urgent Care (no appt availability in office) / [] Appointment(In office/virtual)/ []  [place] [facility]/ [] Home Care/ [] Refused Recommended Disposition /[] [place] [facility]/ [x]  Follow-up with PCP Additional Notes: pt is upset that nothing was prescribed for heart attack and stroke risk, advised pt that she is already on ASA and cholesertol medication which helps. Pt states she wants to try patches for quitting smoking since she hasn't had good luck in the past. Advised pt I would send message to provider for review on prescribing patches since I know they can be costly OTC. No further assistance needed.   Summary: med ?   Patient called in states, her labs show her at heart attack risk and is wondering why she wasn't put on med.      Reason for Disposition  Nursing judgment or information in reference  Answer Assessment - Initial Assessment Questions 1. REASON FOR CALL: "What is your main concern right now?"     Wanting something to help with smoking cessation to decrease risk of MI or CVA 2. ONSET: "When did the  start?"     Today got call about la results and pt upset that nothing was prescribed  6. TREATMENTS AND RESPONSE: "What have you done so far to try to make this better? What medicines have you used?"     Pt has tried Chantix and Wellbutrin in past  Protocols used: No Guideline Available-A-AH

## 2022-07-08 NOTE — Telephone Encounter (Signed)
I need to know how many cigarettes she is smoking daily so I know what dose patch to prescribe

## 2022-07-08 NOTE — Telephone Encounter (Signed)
Pt states she smokes about two packs a day.   Thanks,   -Mickel Baas

## 2022-07-09 MED ORDER — NICOTINE 21 MG/24HR TD PT24: 21.0000 mg | MEDICATED_PATCH | Freq: Every day | TRANSDERMAL | 0 refills | Status: DC

## 2022-07-09 NOTE — Telephone Encounter (Signed)
I have sent NicoDerm 21 mg patch to her pharmacy

## 2022-07-09 NOTE — Addendum Note (Signed)
Addended by: Jearld Fenton on: 07/09/2022 08:01 AM   Modules accepted: Orders

## 2022-07-28 ENCOUNTER — Other Ambulatory Visit: Payer: Self-pay | Admitting: Internal Medicine

## 2022-07-28 DIAGNOSIS — E7800 Pure hypercholesterolemia, unspecified: Secondary | ICD-10-CM

## 2022-07-29 NOTE — Telephone Encounter (Signed)
Requested Prescriptions  Pending Prescriptions Disp Refills   omega-3 acid ethyl esters (LOVAZA) 1 g capsule [Pharmacy Med Name: OMEGA-3-ACID ETHYL ESTERS 1 GM CAP] 180 capsule 1    Sig: TAKE 2 CAPSULES BY MOUTH 2 TIMES DAILY     Endocrinology:  Nutritional Agents - omega-3 acid ethyl esters Failed - 07/28/2022 11:43 AM      Failed - Lipid Panel in normal range within the last 12 months    Cholesterol  Date Value Ref Range Status  07/03/2022 150 <200 mg/dL Final   LDL Cholesterol (Calc)  Date Value Ref Range Status  07/03/2022 84 mg/dL (calc) Final    Comment:    Reference range: <100 . Desirable range <100 mg/dL for primary prevention;   <70 mg/dL for patients with CHD or diabetic patients  with > or = 2 CHD risk factors. Marland Kitchen LDL-C is now calculated using the Martin-Hopkins  calculation, which is a validated novel method providing  better accuracy than the Friedewald equation in the  estimation of LDL-C.  Horald Pollen et al. Lenox Ahr. 1610;960(45): 2061-2068  (http://education.[facility].com/faq/FAQ164)    HDL  Date Value Ref Range Status  07/03/2022 43 (L) > OR = 50 mg/dL Final   Triglycerides  Date Value Ref Range Status  07/03/2022 134 <150 mg/dL Final         Passed - Valid encounter within last 12 months    Recent Outpatient Visits           3 weeks ago Acquired hypothyroidism   [facility] [place], [place] W, NP   4 months ago Acute respiratory failure with hypoxia [facility])   [place] [facility] [place], Salvadore Oxford, NP   7 months ago Need for influenza vaccination   [place] [facility] [place], Salvadore Oxford, NP   11 months ago OAB (overactive bladder)   [place] [facility] Smit[place] Cords, DO   1 year ago Acquired hypothyroidism   [place] [facility] [place], Salvadore Oxford, NP       Future Appointments             In 5 months Baity, Salvadore Oxford, NP [place]  [facility], [facility]             fluticasone [facility]) 50 MCG/ACT nasal spray [Pharmacy Med Name: FLUTICASONE PROPIONATE 50 MCG/ACT N] 16 g 0    Sig: INSTILL 1 SPRAY IN EACH NOSTRIL DAILY ASNEEDED FOR SEASONAL ALLERGY SYMPTOMS     Ear, Nose, and Throat: Nasal Preparations - Corticosteroids Passed - 07/28/2022 11:43 AM      Passed - Valid encounter within last 12 months    Recent Outpatient Visits           3 weeks ago Acquired hypothyroidism   [place] [facility] [place], [place] W, NP   4 months ago Acute respiratory failure with hypoxia [facility])   [place] [facility] [place], Salvadore Oxford, NP   7 months ago Need for influenza vaccination   [place] [facility] [place], Salvadore Oxford, NP   11 months ago OAB (overactive bladder)   [facility] Smitty Cords, DO   1 year ago Acquired hypothyroidism   [place] [facility] [place], Salvadore Oxford, NP       Future Appointments  In 5 months Baity, Salvadore Oxford, NP [place] [facility], [facility]

## 2022-08-14 DIAGNOSIS — F17210 Nicotine dependence, cigarettes, uncomplicated: Secondary | ICD-10-CM | POA: Diagnosis not present

## 2022-08-14 DIAGNOSIS — Z79891 Long term (current) use of opiate analgesic: Secondary | ICD-10-CM | POA: Diagnosis not present

## 2022-08-14 DIAGNOSIS — G894 Chronic pain syndrome: Secondary | ICD-10-CM | POA: Diagnosis not present

## 2022-08-15 ENCOUNTER — Ambulatory Visit (INDEPENDENT_AMBULATORY_CARE_PROVIDER_SITE_OTHER): Payer: 59

## 2022-08-15 VITALS — Ht 66.0 in | Wt 196.0 lb

## 2022-08-15 DIAGNOSIS — Z0000 Encounter for general adult medical examination without abnormal findings: Secondary | ICD-10-CM | POA: Diagnosis not present

## 2022-08-15 NOTE — Patient Instructions (Signed)
Ms. Mazmanian , Thank you for taking time to come for your Medicare Wellness Visit. I appreciate your ongoing commitment to your health goals. Please review the following plan we discussed and let me know if I can assist you in the future.   These are the goals we discussed:  Goals      DIET - EAT MORE FRUITS AND VEGETABLES     DIET - INCREASE WATER INTAKE        This is a list of the screening recommended for you and due dates:  Health Maintenance  Topic Date Due   DTaP/Tdap/Td vaccine (1 - Tdap) Never done   Zoster (Shingles) Vaccine (1 of 2) Never done   Pap Smear  Never done   Colon Cancer Screening  Never done   Mammogram  03/30/2020   COVID-19 Vaccine (1) 12/14/2022*   Flu Shot  11/13/2022   Screening for Lung Cancer  03/15/2023   Medicare Annual Wellness Visit  08/15/2023   Hepatitis C Screening: USPSTF Recommendation to screen - Ages 18-79 yo.  Completed   HIV Screening  Completed   HPV Vaccine  Aged Out  *Topic was postponed. The date shown is not the original due date.    Advanced directives: no  Conditions/risks identified: none  Next appointment: Follow up in one year for your annual wellness visit. 08/21/23 @ 1:00 pm by phone  Preventive Care 40-64 Years, Female Preventive care refers to lifestyle choices and visits with your health care provider that can promote health and wellness. What does preventive care include? A yearly physical exam. This is also called an annual well check. Dental exams once or twice a year. Routine eye exams. Ask your health care provider how often you should have your eyes checked. Personal lifestyle choices, including: Daily care of your teeth and gums. Regular physical activity. Eating a healthy diet. Avoiding tobacco and drug use. Limiting alcohol use. Practicing safe sex. Taking low-dose aspirin daily starting at age 36. Taking vitamin and mineral supplements as recommended by your health care provider. What happens during an  annual well check? The services and screenings done by your health care provider during your annual well check will depend on your age, overall health, lifestyle risk factors, and family history of disease. Counseling  Your health care provider may ask you questions about your: Alcohol use. Tobacco use. Drug use. Emotional well-being. Home and relationship well-being. Sexual activity. Eating habits. Work and work Astr[employer]. Method of birth control. Menstrual cycle. Pregnancy history. Screening  You may have the following tests or measurements: Height, weight, and BMI. Blood pressure. Lipid and cholesterol levels. These may be checked every 5 years, or more frequently if you are over 7 years old. Skin check. Lung cancer screening. You may have this screening every year starting at age 32 if you have a 30-pack-year history of smoking and currently smoke or have quit within the past 15 years. Fecal occult blood test (FOBT) of the stool. You may have this test every year starting at age 83. Flexible sigmoidoscopy or colonoscopy. You may have a sigmoidoscopy every 5 years or a colonoscopy every 10 years starting at age 76. Hepatitis C blood test. Hepatitis B blood test. Sexually transmitted disease (STD) testing. Diabetes screening. This is done by checking your blood sugar (glucose) after you have not eaten for a while (fasting). You may have this done every 1-3 years. Mammogram. This may be done every 1-2 years. Talk to your health care provider about when  you should start having regular mammograms. This may depend on whether you have a family history of breast cancer. BRCA-related cancer screening. This may be done if you have a family history of breast, ovarian, tubal, or peritoneal cancers. Pelvic exam and Pap test. This may be done every 3 years starting at age 73. Starting at age 80, this may be done every 5 years if you have a Pap test in combination with an HPV test. Bone  density scan. This is done to screen for osteoporosis. You may have this scan if you are at high risk for osteoporosis. Discuss your test results, treatment options, and if necessary, the need for more tests with your health care provider. Vaccines  Your health care provider may recommend certain vaccines, such as: Influenza vaccine. This is recommended every year. Tetanus, diphtheria, and acellular pertussis (Tdap, Td) vaccine. You may need a Td booster every 10 years. Zoster vaccine. You may need this after age 75. Pneumococcal 13-valent conjugate (PCV13) vaccine. You may need this if you have certain conditions and were not previously vaccinated. Pneumococcal polysaccharide (PPSV23) vaccine. You may need one or two doses if you smoke cigarettes or if you have certain conditions. Talk to your health care provider about which screenings and vaccines you need and how often you need them. This information is not intended to replace advice given to you by your health care provider. Make sure you discuss any questions you have with your health care provider. Document Released: 04/27/2015 Document Revised: 12/19/2015 Document Reviewed: 01/30/2015 Elsevier Interactive Patient Education  2017 [employer].    Fall Prevention in the Home Falls can cause injuries. They can happen to people of all ages. There are many things you can do to make your home safe and to help prevent falls. What can I do on the outside of my home? Regularly fix the edges of walkways and driveways and fix any cracks. Remove anything that might make you trip as you walk through a door, such as a raised step or threshold. Trim any bushes or trees on the path to your home. Use bright outdoor lighting. Clear any walking paths of anything that might make someone trip, such as rocks or tools. Regularly check to see if handrails are loose or broken. Make sure that both sides of any steps have handrails. Any raised decks and  porches should have guardrails on the edges. Have any leaves, snow, or ice cleared regularly. Use sand or salt on walking paths during winter. Clean up any spills in your garage right away. This includes oil or grease spills. What can I do in the bathroom? Use night lights. Install grab bars by the toilet and in the tub and shower. Do not use towel bars as grab bars. Use non-skid mats or decals in the tub or shower. If you need to sit down in the shower, use a plastic, non-slip stool. Keep the floor dry. Clean up any water that spills on the floor as soon as it happens. Remove soap buildup in the tub or shower regularly. Attach bath mats securely with double-sided non-slip rug tape. Do not have throw rugs and other things on the floor that can make you trip. What can I do in the bedroom? Use night lights. Make sure that you have a light by your bed that is easy to reach. Do not use any sheets or blankets that are too big for your bed. They should not hang down onto the floor. Have a  firm chair that has side arms. You can use this for support while you get dressed. Do not have throw rugs and other things on the floor that can make you trip. What can I do in the kitchen? Clean up any spills right away. Avoid walking on wet floors. Keep items that you use a lot in easy-to-reach places. If you need to reach something above you, use a strong step stool that has a grab bar. Keep electrical cords out of the way. Do not use floor polish or wax that makes floors slippery. If you must use wax, use non-skid floor wax. Do not have throw rugs and other things on the floor that can make you trip. What can I do with my stairs? Do not leave any items on the stairs. Make sure that there are handrails on both sides of the stairs and use them. Fix handrails that are broken or loose. Make sure that handrails are as long as the stairways. Check any carpeting to make sure that it is firmly attached to the  stairs. Fix any carpet that is loose or worn. Avoid having throw rugs at the top or bottom of the stairs. If you do have throw rugs, attach them to the floor with carpet tape. Make sure that you have a light switch at the top of the stairs and the bottom of the stairs. If you do not have them, ask someone to add them for you. What else can I do to help prevent falls? Wear shoes that: Do not have high heels. Have rubber bottoms. Are comfortable and fit you well. Are closed at the toe. Do not wear sandals. If you use a stepladder: Make sure that it is fully opened. Do not climb a closed stepladder. Make sure that both sides of the stepladder are locked into place. Ask someone to hold it for you, if possible. Clearly mark and make sure that you can see: Any grab bars or handrails. First and last steps. Where the edge of each step is. Use tools that help you move around (mobility aids) if they are needed. These include: Canes. Walkers. Scooters. Crutches. Turn on the lights when you go into a dark area. Replace any light bulbs as soon as they burn out. Set up your furniture so you have a clear path. Avoid moving your furniture around. If any of your floors are uneven, fix them. If there are any pets around you, be aware of where they are. Review your medicines with your doctor. Some medicines can make you feel dizzy. This can increase your chance of falling. Ask your doctor what other things that you can do to help prevent falls. This information is not intended to replace advice given to you by your health care provider. Make sure you discuss any questions you have with your health care provider. Document Released: 01/25/2009 Document Revised: 09/06/2015 Document Reviewed: 05/05/2014 Elsevier Interactive Patient Education  2017 [employer].

## 2022-08-15 NOTE — Progress Notes (Signed)
I connected with  April Soto on 08/15/22 by a audio enabled telemedicine application and verified that I am speaking with the correct person using two identifiers.  Patient Location: Home  Provider Location: Office/Clinic  I discussed the limitations of evaluation and management by telemedicine. The patient expressed understanding and agreed to proceed.  Subjective:   April Soto is a 62 y.o. female who presents for Medicare Annual (Subsequent) preventive examination.  Review of Systems     Cardiac Risk Factors include: advanced age (>49men, >61 women);dyslipidemia;hypertension     Objective:    Today's Vitals   08/15/22 1333  PainSc: 5    There is no height or weight on file to calculate BMI.     08/15/2022    1:37 PM 03/14/2022    6:15 PM 08/09/2021    3:22 PM 05/10/2021   12:28 PM 08/17/2017    4:00 PM 03/13/2017   12:11 PM 02/22/2016   12:06 AM  Advanced Directives  Does Patient Have a Medical Advance Directive? No No No No  No No  Would patient like information on creating a medical advance directive? No - Patient declined No - Patient declined No - Patient declined   No - Patient declined      Information is confidential and restricted. Go to Review Flowsheets to unlock data.    Current Medications (verified) Outpatient Encounter Medications as of 08/15/2022  Medication Sig   acyclovir (ZOVIRAX) 400 MG tablet TAKE 1 TABLET BY MOUTH DAILY   albuterol (VENTOLIN HFA) 108 (90 Base) MCG/ACT inhaler Inhale 2 puffs into the lungs every 4 (four) hours as needed for wheezing or shortness of breath.    aspirin EC 81 MG tablet Take 1 tablet (81 mg total) by mouth daily. Swallow whole.   cetirizine (ZYRTEC) 10 MG tablet Take 1 tablet (10 mg total) by mouth daily.   diclofenac (VOLTAREN) 75 MG EC tablet Take 1 tablet (75 mg total) by mouth 2 (two) times daily.   esomeprazole (NEXIUM) 40 MG capsule TAKE 1 CAPSULE BY MOUTH DAILY   fluticasone (FLONASE) 50 MCG/ACT nasal spray  INSTILL 1 SPRAY IN EACH NOSTRIL DAILY ASNEEDED FOR SEASONAL ALLERGY SYMPTOMS   hydrocortisone (ANUSOL-HC) 2.5 % rectal cream Apply 1 application topically as needed for hemorrhoids.    hydrOXYzine (ATARAX) 25 MG tablet Take 25 mg by mouth 2 (two) times daily.   metoprolol succinate (TOPROL-XL) 50 MG 24 hr tablet TAKE 1 TABLET BY MOUTH AT BEDTIME   morphine (MS CONTIN) 15 MG 12 hr tablet Take 15 mg by mouth 3 (three) times daily.   MOVANTIK 25 MG TABS tablet Take 25 mg by mouth daily.   MYRBETRIQ 50 MG TB24 tablet TAKE 1 TABLET BY MOUTH DAILY   Olopatadine HCl 0.2 % SOLN Place 1 drop into both eyes at bedtime as needed (allergies).   omega-3 acid ethyl esters (LOVAZA) 1 g capsule TAKE 2 CAPSULES BY MOUTH 2 TIMES DAILY   ondansetron (ZOFRAN-ODT) 4 MG disintegrating tablet Take 1 tablet (4 mg total) by mouth every 8 (eight) hours as needed for nausea or vomiting.   Oxycodone HCl 10 MG TABS Take 10 mg by mouth 4 (four) times daily as needed.   pregabalin (LYRICA) 100 MG capsule Take 100 mg by mouth 3 (three) times daily.   rosuvastatin (CRESTOR) 40 MG tablet TAKE 1 TABLET BY MOUTH DAILY   SYMBICORT 160-4.5 MCG/ACT inhaler INHALE 2 PUFFS BY MOUTH TWICE DAILY (MAINTENANCE INHALER). RINSE MOUTH WELL AFTER USE TO  PREVENT THRUSH.   SYNTHROID 88 MCG tablet Take 1 tablet (88 mcg total) by mouth daily before breakfast.   venlafaxine XR (EFFEXOR-XR) 150 MG 24 hr capsule Take 1 capsule (150 mg total) by mouth every evening. For mood control   ziprasidone (GEODON) 40 MG capsule TAKE 1 CAPSULE BY MOUTH EVERY EVENING WITH FOOD FOR MOOD   nicotine (NICODERM CQ - DOSED IN MG/24 HOURS) 21 mg/24hr patch Place 1 patch (21 mg total) onto the skin daily. (Patient not taking: Reported on 08/15/2022)   No facility-administered encounter medications on file as of 08/15/2022.    Allergies (verified) Patient has no known allergies.   History: Past Medical History:  Diagnosis Date   Anemia    "years ago"   Anxiety     takes Ativan daily   Asthma    Bipolar 1 disorder (HCC)    Chronic back pain    HNP   Constipation    takes Ra Col Rite daily   Degenerative disc disease    Depression    was on Prozac but taken off by MD for a couple of weeks   Depression    Fibromyalgia    takes Lyrica daily   Genital herpes    GERD (gastroesophageal reflux disease)    takes Nexium daily   Headache(784.0)    Heart murmur    "grew out of"   History of bronchitis    last time 15+yrs ago   History of rheumatic fever    Hyperlipidemia    takes Simvastatin daily   Hypertension    takes Coreg daily   Hypothyroidism    takes Synthroid daily   Joint pain    Muscle spasm    takes Flexeril daily as needed   Nocturia    Osteoarthritis    Peripheral edema    takes HCTZ daily   Pneumonia    hx of;last time 30+yrs ago   Raynaud's disease    Rheumatic fever    as a child   Seasonal allergies    takes Zyrtec daily and uses Dymista as well   Stroke (HCC)    ? 5 years ago. No lasting deficits   Urinary incontinence    Weakness    nmbness and tingling-right leg   Past Surgical History:  Procedure Laterality Date   ABDOMINAL HYSTERECTOMY     BACK SURGERY     CHOLECYSTECTOMY     COLONOSCOPY     ESOPHAGOGASTRODUODENOSCOPY     FOOT SURGERY Right 12/2013   ankle, heel and top of foot   LUMBAR LAMINECTOMY/DECOMPRESSION MICRODISCECTOMY Right 01/14/2013   Procedure: RIGHT LUMBAR FOUR FIVE  LAMINECTOMY/DECOMPRESSION MICRODISCECTOMY 1 LEVEL;  Surgeon: Reinaldo Meeker, MD;  Location: [facility];  Service: Neurosurgery;  Laterality: Right;  RIGHT L45 microdiskectomy   OTHER SURGICAL HISTORY     Gastric stapling for weight loss. patient reports done in 1980's   SEPTOPLASTY     TONSILLECTOMY     TUBAL LIGATION     Family History  Problem Relation Age of Onset   Diabetes type II Mother    Hypertension Mother    Diabetes Mother    Diabetes Sister    Hypertension Sister    Hypertension Maternal Grandmother     Diabetes Maternal Grandmother    Breast cancer Neg Hx    Social History   Socioeconomic History   Marital status: Divorced    Spouse name: Not on file   Number of children: Not on file  Years of education: Not on file   Highest education level: Not on file  Occupational History   Not on file  Tobacco Use   Smoking status: Some Days    Packs/day: 1.00    Years: 40.00    Additional pack years: 0.00    Total pack years: 40.00    Types: Cigarettes   Smokeless tobacco: Never  Vaping Use   Vaping Use: Never used  Substance and Sexual Activity   Alcohol use: No   Drug use: No   Sexual activity: Not Currently    Birth control/protection: Surgical  Other Topics Concern   Not on file  Social History Narrative   Not on file   Social Determinants of Health   Financial Resource Strain: Low Risk  (08/15/2022)   Overall Financial Resource Strain (CARDIA)    Difficulty of Paying Living Expenses: Not hard at all  Food Insecurity: No Food Insecurity (08/15/2022)   Hunger Vital Sign    Worried About Running Out of Food in the Last Year: Never true    Ran Out of Food in the Last Year: Never true  Transportation Needs: No Transportation Needs (08/15/2022)   PRAPARE - Administrator, Civil Service (Medical): No    Lack of Transportation (Non-Medical): No  Physical Activity: Inactive (08/15/2022)   Exercise Vital Sign    Days of Exercise per Week: 0 days    Minutes of Exercise per Session: 0 min  Stress: No Stress Concern Present (08/15/2022)   [employer] of Occupational Health - Occupational Stress Questionnaire    Feeling of Stress : Not at all  Social Connections: Socially Isolated (08/15/2022)   Social Connection and Isolation Panel [NHANES]    Frequency of Communication with Friends and Family: More than three times a week    Frequency of Social Gatherings with Friends and Family: More than three times a week    Attends Religious Services: Never    Doctor, general practice or Organizations: No    Attends Engineer, structural: Never    Marital Status: Divorced    Tobacco Counseling Ready to quit: Not Answered Counseling given: Not Answered   Clinical Intake:  Pre-visit preparation completed: Yes  Pain : 0-10 Pain Score: 5  Pain Type: Chronic pain Pain Location: Back Pain Radiating Towards: leg     Nutritional Risks: None Diabetes: No  How often do you need to have someone help you when you read instructions, pamphlets, or other written materials from your doctor or pharmacy?: 1 - Never  Diabetic?no  Interpreter Needed?: No  Information entered by :: Kennedy Bucker, LPN   Activities of Daily Living    08/15/2022    1:39 PM 07/03/2022    3:56 PM  In your present state of health, do you have any difficulty performing the following activities:  Hearing? 0 0  Vision? 1 1  Difficulty concentrating or making decisions? 0 0  Walking or climbing stairs? 1 1  Dressing or bathing? 0 1  Doing errands, shopping? 1 1  Preparing Food and eating ? N   Using the Toilet? N   In the past six months, have you accidently leaked urine? N   Do you have problems with loss of bowel control? N   Managing your Medications? N   Managing your Finances? N   Housekeeping or managing your Housekeeping? Y     Patient Care Team: Lorre Munroe, NP as PCP - General (Internal Medicine)  Indicate any recent Medical Services you may have received from other than [facility] providers in the past year (date may be approximate).     Assessment:   This is a routine wellness examination for Andrey.  Hearing/Vision screen Hearing Screening - Comments:: No aids Vision Screening - Comments:: Wears glasses-   Dietary issues and exercise activities discussed: Current Exercise Habits: The patient does not participate in regular exercise at present   Goals Addressed             This Visit's Progress    DIET - INCREASE WATER INTAKE          Depression Screen    08/15/2022    1:36 PM 07/03/2022    3:56 PM 03/26/2022    9:33 AM 12/17/2021    4:09 PM 12/17/2021    3:24 PM 09/02/2021    3:49 PM 08/09/2021    3:21 PM  PHQ 2/9 Scores  PHQ - 2 Score 0 2 0 0 0 0 0  PHQ- 9 Score 0 2 3 2  0 0     Fall Risk    08/15/2022    1:38 PM 07/03/2022    3:56 PM 12/17/2021    4:09 PM 09/02/2021    3:49 PM 08/09/2021    3:23 PM  Fall Risk   Falls in the past year? 1 1 0 0 0  Number falls in past yr: 0 1 0 0 0  Injury with Fall? 0 0 0 0 0  Risk for fall due to : History of fall(s);Impaired balance/gait Impaired balance/gait No Fall Risks Impaired balance/gait;Impaired mobility No Fall Risks  Follow up Falls prevention discussed;Falls evaluation completed  Falls evaluation completed Falls evaluation completed Falls evaluation completed    FALL RISK PREVENTION PERTAINING TO THE HOME:  Any stairs in or around the home? Yes  If so, are there any without handrails? No  Home free of loose throw rugs in walkways, pet beds, electrical Soto, etc? Yes  Adequate lighting in your home to reduce risk of falls? Yes   ASSISTIVE DEVICES UTILIZED TO PREVENT FALLS:  Life alert? No  Use of a cane, walker or w/c? Yes - walker Grab bars in the bathroom? No  Shower chair or bench in shower? Yes  Elevated toilet seat or a handicapped toilet? Yes    Cognitive Function:        08/15/2022    1:43 PM  6CIT Screen  What Year? 0 points  What month? 0 points  What time? 0 points  Count back from 20 0 points  Months in reverse 0 points  Repeat phrase 0 points  Total Score 0 points    Immunizations Immunization History  Administered Date(s) Administered   Influenza,inj,Quad PF,6+ Mos 12/17/2021    TDAP status: Due, Education has been provided regarding the importance of this vaccine. Advised may receive this vaccine at local pharmacy or Health Dept. Aware to provide a copy of the vaccination record if obtained from local pharmacy or Health Dept.  Verbalized acceptance and understanding.  Flu Vaccine status: Up to date  Pneumococcal vaccine status: Declined,  Education has been provided regarding the importance of this vaccine but patient still declined. Advised may receive this vaccine at local pharmacy or Health Dept. Aware to provide a copy of the vaccination record if obtained from local pharmacy or Health Dept. Verbalized acceptance and understanding.   Covid-19 vaccine status: Declined, Education has been provided regarding the importance of this vaccine but patient still declined.  Advised may receive this vaccine at local pharmacy or Health Dept.or vaccine clinic. Aware to provide a copy of the vaccination record if obtained from local pharmacy or Health Dept. Verbalized acceptance and understanding.  Qualifies for Shingles Vaccine? Yes   Zostavax completed No   Shingrix Completed?: No.    Education has been provided regarding the importance of this vaccine. Patient has been advised to call insurance company to determine out of pocket expense if they have not yet received this vaccine. Advised may also receive vaccine at local pharmacy or Health Dept. Verbalized acceptance and understanding.  Screening Tests Health Maintenance  Topic Date Due   DTaP/Tdap/Td (1 - Tdap) Never done   Zoster Vaccines- Shingrix (1 of 2) Never done   PAP SMEAR-Modifier  Never done   COLONOSCOPY (Pts 45-18yrs Insurance coverage will need to be confirmed)  Never done   MAMMOGRAM  03/30/2020   COVID-19 Vaccine (1) 12/14/2022 (Originally 03/14/1966)   INFLUENZA VACCINE  11/13/2022   Lung Cancer Screening  03/15/2023   Medicare Annual Wellness (AWV)  08/15/2023   Hepatitis C Screening  Completed   HIV Screening  Completed   HPV VACCINES  Aged Out    Health Maintenance  Health Maintenance Due  Topic Date Due   DTaP/Tdap/Td (1 - Tdap) Never done   Zoster Vaccines- Shingrix (1 of 2) Never done   PAP SMEAR-Modifier  Never done   COLONOSCOPY (Pts  45-51yrs Insurance coverage will need to be confirmed)  Never done   MAMMOGRAM  03/30/2020    Declined referral, says had colonoscopy done several years ago at [facility]  Declined referral for mammogram   Lung Cancer Screening: (Low Dose CT Chest recommended if Age 75-80 years, 30 pack-year currently smoking OR have quit w/in 15years.) does qualify.   Lung Cancer Screening Referral: had CT 03/14/22  Additional Screening:  Hepatitis C Screening: does qualify; Completed 07/05/13  Vision Screening: Recommended annual ophthalmology exams for early detection of glaucoma and other disorders of the eye. Is the patient up to date with their annual eye exam?  No  Who is the provider or what is the name of the office in which the patient attends annual eye exams? No one If pt is not established with a provider, would they like to be referred to a provider to establish care? No .   Dental Screening: Recommended annual dental exams for proper oral hygiene  Community Resource Referral / Chronic Care Management: CRR required this visit?  No   CCM required this visit?  No      Plan:     I have personally reviewed and noted the following in the patient's chart:   Medical and social history Use of alcohol, tobacco or illicit drugs  Current medications and supplements including opioid prescriptions. Patient is not currently taking opioid prescriptions. Functional ability and status Nutritional status Physical activity Advanced directives List of other physicians Hospitalizations, surgeries, and ER visits in previous 12 months Vitals Screenings to include cognitive, depression, and falls Referrals and appointments  In addition, I have reviewed and discussed with patient certain preventive protocols, quality metrics, and best practice recommendations. A written personalized care plan for preventive services as well as general preventive health recommendations were provided to  patient.     Hal Hope, LPN   12/18/2839   Nurse Notes: none

## 2022-08-26 ENCOUNTER — Other Ambulatory Visit: Payer: Self-pay | Admitting: Internal Medicine

## 2022-08-26 NOTE — Telephone Encounter (Signed)
Requested Prescriptions  Pending Prescriptions Disp Refills   SYNTHROID 88 MCG tablet [Pharmacy Med Name: SYNTHROID 88 MCG TAB] 90 tablet 0    Sig: TAKE 1 TABLET BY MOUTH DAILY BEFORE BREAKFAST.     Endocrinology:  Hypothyroid Agents Failed - 08/26/2022  3:40 PM      Failed - TSH in normal range and within 360 days    TSH  Date Value Ref Range Status  07/03/2022 4.64 (H) 0.40 - 4.50 mIU/L Final         Passed - Valid encounter within last 12 months    Recent Outpatient Visits           1 month ago Acquired hypothyroidism   [place] [facility] [place], [place] W, NP   5 months ago Acute respiratory failure with hypoxia [facility])   [place] [facility] [place], Salvadore Oxford, NP   8 months ago Need for influenza vaccination   [place] [facility] [place], Salvadore Oxford, NP   11 months ago OAB (overactive bladder)   [place] [facility] Smitty Cords, DO   1 year ago Acquired hypothyroidism   [place] [facility] [place], Salvadore Oxford, NP       Future Appointments             In 4 months Baity, Salvadore Oxford, NP [place] [facility], [facility]

## 2022-08-29 DIAGNOSIS — M5416 Radiculopathy, lumbar region: Secondary | ICD-10-CM | POA: Diagnosis not present

## 2022-09-17 DIAGNOSIS — Z79899 Other long term (current) drug therapy: Secondary | ICD-10-CM | POA: Diagnosis not present

## 2022-09-17 DIAGNOSIS — M5416 Radiculopathy, lumbar region: Secondary | ICD-10-CM | POA: Diagnosis not present

## 2022-09-17 DIAGNOSIS — M542 Cervicalgia: Secondary | ICD-10-CM | POA: Diagnosis not present

## 2022-09-17 DIAGNOSIS — F17210 Nicotine dependence, cigarettes, uncomplicated: Secondary | ICD-10-CM | POA: Diagnosis not present

## 2022-09-17 DIAGNOSIS — Z79891 Long term (current) use of opiate analgesic: Secondary | ICD-10-CM | POA: Diagnosis not present

## 2022-09-17 DIAGNOSIS — K5903 Drug induced constipation: Secondary | ICD-10-CM | POA: Diagnosis not present

## 2022-09-17 DIAGNOSIS — G894 Chronic pain syndrome: Secondary | ICD-10-CM | POA: Diagnosis not present

## 2022-09-22 ENCOUNTER — Other Ambulatory Visit: Payer: Self-pay | Admitting: Internal Medicine

## 2022-09-22 DIAGNOSIS — A6000 Herpesviral infection of urogenital system, unspecified: Secondary | ICD-10-CM

## 2022-09-22 DIAGNOSIS — E785 Hyperlipidemia, unspecified: Secondary | ICD-10-CM

## 2022-09-22 DIAGNOSIS — I10 Essential (primary) hypertension: Secondary | ICD-10-CM

## 2022-09-23 NOTE — Telephone Encounter (Signed)
Requested Prescriptions  Pending Prescriptions Disp Refills   SYMBICORT 160-4.5 MCG/ACT inhaler [Pharmacy Med Name: SYMBICORT 160-4.5 MCG/ACT INH AERO] 10.2 g 5    Sig: INHALE 2 PUFFS BY MOUTH TWICE DAILY (MAINTENANCE INHALER). RINSE MOUTH WELL AFTER USE TO PREVENT THRUSH.     Pulmonology:  Combination Products Passed - 09/22/2022  5:12 PM      Passed - Valid encounter within last 12 months    Recent Outpatient Visits           2 months ago Acquired hypothyroidism   [place] [facility] [place], [place], NP   6 months ago Acute respiratory failure with hypoxia [facility])   [place] [facility] [place], Salvadore Oxford, NP   9 months ago Need for influenza vaccination   [place] [facility] [place], Salvadore Oxford, NP   1 year ago OAB (overactive bladder)   [place] [facility] Smitty Cords, DO   1 year ago Acquired hypothyroidism   [place] [facility] [place], Salvadore Oxford, NP       Future Appointments             In 3 months Baity, Salvadore Oxford, NP [place] [facility], [facility]             rosuvastatin (CRESTOR) 40 MG tablet [Pharmacy Med Name: ROSUVASTATIN CALCIUM 40 MG TAB] 90 tablet 1    Sig: TAKE 1 TABLET BY MOUTH DAILY     Cardiovascular:  Antilipid - Statins 2 Failed - 09/22/2022  5:12 PM      Failed - Lipid Panel in normal range within the last 12 months    Cholesterol  Date Value Ref Range Status  07/03/2022 150 <200 mg/dL Final   LDL Cholesterol (Calc)  Date Value Ref Range Status  07/03/2022 84 mg/dL (calc) Final    Comment:    Reference range: <100 . Desirable range <100 mg/dL for primary prevention;   <70 mg/dL for patients with CHD or diabetic patients  with > or = 2 CHD risk factors. Marland Kitchen LDL-C is now calculated using the Martin-Hopkins  calculation, which is a validated novel method providing  better accuracy than the Friedewald equation in the  estimation of  LDL-C.  Horald Pollen et al. Lenox Ahr. 1610;960(45): 2061-2068  (http://education.[employer].com/faq/FAQ164)    HDL  Date Value Ref Range Status  07/03/2022 43 (L) > OR = 50 mg/dL Final   Triglycerides  Date Value Ref Range Status  07/03/2022 134 <150 mg/dL Final         Passed - Cr in normal range and within 360 days    Creat  Date Value Ref Range Status  07/03/2022 0.69 0.50 - 1.05 mg/dL Final         Passed - Patient is not pregnant      Passed - Valid encounter within last 12 months    Recent Outpatient Visits           2 months ago Acquired hypothyroidism   [place] [facility] [place], [place] W, NP   6 months ago Acute respiratory failure with hypoxia [facility])   [place] [place], Salvadore Oxford, NP   9 months ago Need for influenza vaccination   [place] [facility] [place], Salvadore Oxford, NP   1 year ago OAB (overactive bladder)   [place] [facility] [place], [place], [place]  1 year ago Acquired hypothyroidism   [place] [facility] [place], Salvadore Oxford, NP       Future Appointments             In 3 months Baity, Salvadore Oxford, NP [place] [facility], [facility]             metoprolol succinate (TOPROL-XL) 50 MG 24 hr tablet [Pharmacy Med Name: METOPROLOL SUCCINATE ER 50 MG TAB] 90 tablet 1    Sig: TAKE 1 TABLET BY MOUTH AT BEDTIME     Cardiovascular:  Beta Blockers Passed - 09/22/2022  5:12 PM      Passed - Last BP in normal range    BP Readings from Last 1 Encounters:  07/03/22 100/74         Passed - Last Heart Rate in normal range    Pulse Readings from Last 1 Encounters:  07/03/22 85         Passed - Valid encounter within last 6 months    Recent Outpatient Visits           2 months ago Acquired hypothyroidism   [place] [facility] [place], [place] W, NP   6 months ago Acute respiratory failure with hypoxia [facility])   [facility] [place], Salvadore Oxford, NP   9 months ago Need for influenza vaccination   [place] [facility] [place], Salvadore Oxford, NP   1 year ago OAB (overactive bladder)   [place] [facility] Smitty Cords, DO   1 year ago Acquired hypothyroidism   [place] [facility] [place], Salvadore Oxford, NP       Future Appointments             In 3 months Baity, Salvadore Oxford, NP [facility], [facility]             acyclovir (ZOVIRAX) 400 MG tablet [Pharmacy Med Name: ACYCLOVIR 400 MG TAB] 90 tablet 1    Sig: TAKE 1 TABLET BY MOUTH DAILY     Antimicrobials:  Antiviral Agents - Anti-Herpetic Passed - 09/22/2022  5:12 PM      Passed - Valid encounter within last 12 months    Recent Outpatient Visits           2 months ago Acquired hypothyroidism   [place] [facility] [place], [place], NP   6 months ago Acute respiratory failure with hypoxia [facility])   [place] [facility] [place], Salvadore Oxford, NP   9 months ago Need for influenza vaccination   [place] [facility] [place], Salvadore Oxford, NP   1 year ago OAB (overactive bladder)   [place] [facility] Smitty Cords, DO   1 year ago Acquired hypothyroidism   [place] [facility] [place], Salvadore Oxford, NP       Future Appointments             In 3 months Baity, Salvadore Oxford, NP [place] [facility], [facility]

## 2022-10-22 DIAGNOSIS — M5416 Radiculopathy, lumbar region: Secondary | ICD-10-CM | POA: Diagnosis not present

## 2022-10-22 DIAGNOSIS — Z79891 Long term (current) use of opiate analgesic: Secondary | ICD-10-CM | POA: Diagnosis not present

## 2022-10-22 DIAGNOSIS — K5903 Drug induced constipation: Secondary | ICD-10-CM | POA: Diagnosis not present

## 2022-10-22 DIAGNOSIS — M542 Cervicalgia: Secondary | ICD-10-CM | POA: Diagnosis not present

## 2022-10-22 DIAGNOSIS — G894 Chronic pain syndrome: Secondary | ICD-10-CM | POA: Diagnosis not present

## 2022-10-22 DIAGNOSIS — Z79899 Other long term (current) drug therapy: Secondary | ICD-10-CM | POA: Diagnosis not present

## 2022-10-22 DIAGNOSIS — F17210 Nicotine dependence, cigarettes, uncomplicated: Secondary | ICD-10-CM | POA: Diagnosis not present

## 2022-10-23 ENCOUNTER — Other Ambulatory Visit: Payer: Self-pay | Admitting: Internal Medicine

## 2022-10-23 DIAGNOSIS — N3281 Overactive bladder: Secondary | ICD-10-CM

## 2022-10-24 NOTE — Telephone Encounter (Signed)
Requested Prescriptions  Pending Prescriptions Disp Refills   MYRBETRIQ 50 MG TB24 tablet [Pharmacy Med Name: MYRBETRIQ 50 MG TAB] 90 tablet 0    Sig: TAKE 1 TABLET BY MOUTH DAILY     Urology: Bladder Agents - mirabegron Failed - 10/23/2022  1:04 PM      Failed - AST in normal range and within 360 days    AST  Date Value Ref Range Status  07/03/2022 37 (H) 10 - 35 U/L Final   SGOT(AST)  Date Value Ref Range Status  03/30/2014 18 15 - 37 Unit/L Final         Passed - Cr in normal range and within 360 days    Creat  Date Value Ref Range Status  07/03/2022 0.69 0.50 - 1.05 mg/dL Final         Passed - ALT in normal range and within 360 days    ALT  Date Value Ref Range Status  07/03/2022 25 6 - 29 U/L Final   SGPT (ALT)  Date Value Ref Range Status  03/30/2014 20 U/L Final    Comment:    14-63 NOTE: New Reference Range 11/01/13          Passed - eGFR is 15 or above and within 360 days    GFR, Est African American  Date Value Ref Range Status  08/08/2020 118 > OR = 60 mL/min/1.19m2 Final   GFR, Est Non African American  Date Value Ref Range Status  08/08/2020 102 > OR = 60 mL/min/1.37m2 Final   GFR, Estimated  Date Value Ref Range Status  03/17/2022 >60 >60 mL/min Final    Comment:    (NOTE) Calculated using the CKD-EPI Creatinine Equation (2021)    eGFR  Date Value Ref Range Status  07/03/2022 97 > OR = 60 mL/min/1.75m2 Final         Passed - Last BP in normal range    BP Readings from Last 1 Encounters:  07/03/22 100/74         Passed - Valid encounter within last 12 months    Recent Outpatient Visits           3 months ago Acquired hypothyroidism   [place] [facility] [place], Salvadore Oxford, NP   7 months ago Acute respiratory failure with hypoxia [facility])   [place] [facility] [place], Salvadore Oxford, NP   10 months ago Need for influenza vaccination   [facility] [place], Salvadore Oxford, NP   1 year  ago OAB (overactive bladder)   [facility] S[place] Cords, DO   1 year ago Acquired hypothyroidism   [facility] [place], Salvadore Oxford, NP       Future Appointments             In 2 months Baity, Salvadore Oxford, NP [place] [facility], [facility]

## 2022-11-26 ENCOUNTER — Other Ambulatory Visit: Payer: Self-pay | Admitting: Internal Medicine

## 2022-11-27 NOTE — Telephone Encounter (Signed)
Requested Prescriptions  Pending Prescriptions Disp Refills   levothyroxine (SYNTHROID) 88 MCG tablet [Pharmacy Med Name: SYNTHROID 88 MCG TAB] 90 tablet 1    Sig: TAKE 1 TABLET BY MOUTH DAILY BEFORE BREAKFAST.     Endocrinology:  Hypothyroid Agents Failed - 11/26/2022 11:52 AM      Failed - TSH in normal range and within 360 days    TSH  Date Value Ref Range Status  07/03/2022 4.64 (H) 0.40 - 4.50 mIU/L Final         Passed - Valid encounter within last 12 months    Recent Outpatient Visits           4 months ago Acquired hypothyroidism   [place] [facility] [place], [place] W, NP   8 months ago Acute respiratory failure with hypoxia [facility])   [place] [facility] [place], Salvadore Oxford, NP   11 months ago Need for influenza vaccination   [place] [facility] [place], Salvadore Oxford, NP   1 year ago OAB (overactive bladder)   [place] [facility] Smitty Cords, DO   1 year ago Acquired hypothyroidism   [place] [facility] [place], Salvadore Oxford, NP       Future Appointments             In 1 month Baity, Salvadore Oxford, NP [facility], [facility]

## 2022-12-08 ENCOUNTER — Ambulatory Visit: Payer: Self-pay

## 2022-12-08 NOTE — Telephone Encounter (Signed)
Patient has called in regards to having a swollen, infected right foot, pain 6/10.  Please advise and contact patient back #  [phone]  Patient was requesting an appt this Wednesday with PCP office due to transportation issues.  Patient has not checked her temperature, does not feel like she is running a fever.        Chief Complaint: Right foot red, swollen, top of foot. Pain 6-7/10. Blister popped.No availability until Thursday. Ok To schedule pt. Per Fleet Contras.  Symptoms: Above Frequency: 2 weeks ago fell out of bed, bed blister on foot from fall. Pertinent Negatives: Patient denies fever Disposition: [] ED /[] Urgent Care (no appt availability in office) / [x] Appointment(In office/virtual)/ []  [place] [facility]/ [] Home Care/ [] Refused Recommended Disposition /[] [place] [facility]/ []  Follow-up with PCP Additional Notes: Instructed to go to UC for worsening of symptoms.Verbalizes understanding.  Reason for Disposition  [1] Redness of the skin AND [2] no fever  Answer Assessment - Initial Assessment Questions 1. ONSET: "When did the pain start?"      2 weeks ago 2. LOCATION: "Where is the pain located?"      Right foot 3. PAIN: "How bad is the pain?"    (Scale 1-10; or mild, moderate, severe)  - MILD (1-3): doesn't interfere with normal activities.   - MODERATE (4-7): interferes with normal activities (e.g., work or school) or awakens from sleep, limping.   - SEVERE (8-10): excruciating pain, unable to do any normal activities, unable to walk.      6-7 4. WORK OR EXERCISE: "Has there been any recent work or exercise that involved this part of the body?"      No 5. CAUSE: "What do you think is causing the foot pain?"     Larey Seat out of bed 6. OTHER SYMPTOMS: "Do you have any other symptoms?" (e.g., leg pain, rash, fever, numbness)     Blister popped, red, top foot 7. PREGNANCY: "Is there any chance you are pregnant?" "When was your last menstrual period?"      No  Protocols used: Foot Pain-A-AH

## 2022-12-11 ENCOUNTER — Ambulatory Visit: Payer: 59 | Admitting: Internal Medicine

## 2022-12-17 DIAGNOSIS — K5903 Drug induced constipation: Secondary | ICD-10-CM | POA: Diagnosis not present

## 2022-12-17 DIAGNOSIS — M542 Cervicalgia: Secondary | ICD-10-CM | POA: Diagnosis not present

## 2022-12-17 DIAGNOSIS — M5416 Radiculopathy, lumbar region: Secondary | ICD-10-CM | POA: Diagnosis not present

## 2022-12-17 DIAGNOSIS — Z79891 Long term (current) use of opiate analgesic: Secondary | ICD-10-CM | POA: Diagnosis not present

## 2022-12-17 DIAGNOSIS — F17210 Nicotine dependence, cigarettes, uncomplicated: Secondary | ICD-10-CM | POA: Diagnosis not present

## 2022-12-17 DIAGNOSIS — Z79899 Other long term (current) drug therapy: Secondary | ICD-10-CM | POA: Diagnosis not present

## 2022-12-17 DIAGNOSIS — G894 Chronic pain syndrome: Secondary | ICD-10-CM | POA: Diagnosis not present

## 2022-12-30 ENCOUNTER — Other Ambulatory Visit: Payer: Self-pay | Admitting: Gastroenterology

## 2022-12-30 DIAGNOSIS — M5416 Radiculopathy, lumbar region: Secondary | ICD-10-CM

## 2023-01-01 ENCOUNTER — Encounter: Payer: 59 | Admitting: Internal Medicine

## 2023-01-01 NOTE — Progress Notes (Deleted)
Subjective:    Patient ID: April Soto, female    DOB: 12-05-1960, 62 y.o.   MRN: [id number]  HPI  Patient presents to clinic today for her annual exam.  Flu: 12/2021 Tetanus: COVID: Never Shingrix: Never Pap smear: Hysterectomy Mammogram: 03/2018 Bone density: Colon screening: Vision screening: Dentist:  Diet: Exercise:   Review of Systems     Past Medical History:  Diagnosis Date   Anemia    "years ago"   Anxiety    takes Ativan daily   Asthma    Bipolar 1 disorder (HCC)    Chronic back pain    HNP   Constipation    takes Ra Col Rite daily   Degenerative disc disease    Depression    was on Prozac but taken off by MD for a couple of weeks   Depression    Fibromyalgia    takes Lyrica daily   Genital herpes    GERD (gastroesophageal reflux disease)    takes Nexium daily   Headache(784.0)    Heart murmur    "grew out of"   History of bronchitis    last time 15+yrs ago   History of rheumatic fever    Hyperlipidemia    takes Simvastatin daily   Hypertension    takes Coreg daily   Hypothyroidism    takes Synthroid daily   Joint pain    Muscle spasm    takes Flexeril daily as needed   Nocturia    Osteoarthritis    Peripheral edema    takes HCTZ daily   Pneumonia    hx of;last time 30+yrs ago   Raynaud's disease    Rheumatic fever    as a child   Seasonal allergies    takes Zyrtec daily and uses Dymista as well   Stroke (HCC)    ? 5 years ago. No lasting deficits   Urinary incontinence    Weakness    nmbness and tingling-right leg    Current Outpatient Medications  Medication Sig Dispense Refill   acyclovir (ZOVIRAX) 400 MG tablet TAKE 1 TABLET BY MOUTH DAILY 90 tablet 1   albuterol (VENTOLIN HFA) 108 (90 Base) MCG/ACT inhaler Inhale 2 puffs into the lungs every 4 (four) hours as needed for wheezing or shortness of breath.      aspirin EC 81 MG tablet Take 1 tablet (81 mg total) by mouth daily. Swallow whole. 30 tablet 12    cetirizine (ZYRTEC) 10 MG tablet Take 1 tablet (10 mg total) by mouth daily. 90 tablet 1   diclofenac (VOLTAREN) 75 MG EC tablet Take 1 tablet (75 mg total) by mouth 2 (two) times daily. 180 tablet 1   esomeprazole (NEXIUM) 40 MG capsule TAKE 1 CAPSULE BY MOUTH DAILY 90 capsule 1   fluticasone (FLONASE) 50 MCG/ACT nasal spray INSTILL 1 SPRAY IN EACH NOSTRIL DAILY ASNEEDED FOR SEASONAL ALLERGY SYMPTOMS 48 g 1   hydrocortisone (ANUSOL-HC) 2.5 % rectal cream Apply 1 application topically as needed for hemorrhoids.      hydrOXYzine (ATARAX) 25 MG tablet Take 25 mg by mouth 2 (two) times daily.     levothyroxine (SYNTHROID) 88 MCG tablet TAKE 1 TABLET BY MOUTH DAILY BEFORE BREAKFAST. 90 tablet 1   metoprolol succinate (TOPROL-XL) 50 MG 24 hr tablet TAKE 1 TABLET BY MOUTH AT BEDTIME 90 tablet 1   morphine (MS CONTIN) 15 MG 12 hr tablet Take 15 mg by mouth 3 (three) times daily.     MOVANTIK  25 MG TABS tablet Take 25 mg by mouth daily.     MYRBETRIQ 50 MG TB24 tablet TAKE 1 TABLET BY MOUTH DAILY 90 tablet 0   nicotine (NICODERM CQ - DOSED IN MG/24 HOURS) 21 mg/24hr patch Place 1 patch (21 mg total) onto the skin daily. (Patient not taking: Reported on 08/15/2022) 28 patch 0   Olopatadine HCl 0.2 % SOLN Place 1 drop into both eyes at bedtime as needed (allergies).     omega-3 acid ethyl esters (LOVAZA) 1 g capsule TAKE 2 CAPSULES BY MOUTH 2 TIMES DAILY 360 capsule 1   ondansetron (ZOFRAN-ODT) 4 MG disintegrating tablet Take 1 tablet (4 mg total) by mouth every 8 (eight) hours as needed for nausea or vomiting. 20 tablet 0   Oxycodone HCl 10 MG TABS Take 10 mg by mouth 4 (four) times daily as needed.     pregabalin (LYRICA) 100 MG capsule Take 100 mg by mouth 3 (three) times daily.     rosuvastatin (CRESTOR) 40 MG tablet TAKE 1 TABLET BY MOUTH DAILY 90 tablet 1   SYMBICORT 160-4.5 MCG/ACT inhaler INHALE 2 PUFFS BY MOUTH TWICE DAILY (MAINTENANCE INHALER). RINSE MOUTH WELL AFTER USE TO PREVENT THRUSH. 10.2 g 5    venlafaxine XR (EFFEXOR-XR) 150 MG 24 hr capsule Take 1 capsule (150 mg total) by mouth every evening. For mood control 30 capsule 0   ziprasidone (GEODON) 40 MG capsule TAKE 1 CAPSULE BY MOUTH EVERY EVENING WITH FOOD FOR MOOD 90 capsule 0   No current facility-administered medications for this visit.    No Known Allergies  Family History  Problem Relation Age of Onset   Diabetes type II Mother    Hypertension Mother    Diabetes Mother    Diabetes Sister    Hypertension Sister    Hypertension Maternal Grandmother    Diabetes Maternal Grandmother    Breast cancer Neg Hx     Social History   Socioeconomic History   Marital status: Divorced    Spouse name: Not on file   Number of children: Not on file   Years of education: Not on file   Highest education level: Not on file  Occupational History   Not on file  Tobacco Use   Smoking status: Some Days    Current packs/day: 1.00    Average packs/day: 1 pack/day for 40.0 years (40.0 ttl pk-yrs)    Types: Cigarettes   Smokeless tobacco: Never  Vaping Use   Vaping status: Never Used  Substance and Sexual Activity   Alcohol use: No   Drug use: No   Sexual activity: Not Currently    Birth control/protection: Surgical  Other Topics Concern   Not on file  Social History Narrative   Not on file   Social Determinants of Health   Financial Resource Strain: Low Risk  (08/15/2022)   Overall Financial Resource Strain (CARDIA)    Difficulty of Paying Living Expenses: Not hard at all  Food Insecurity: No Food Insecurity (08/15/2022)   Hunger Vital Sign    Worried About Running Out of Food in the Last Year: Never true    Ran Out of Food in the Last Year: Never true  Transportation Needs: No Transportation Needs (08/15/2022)   PRAPARE - Administrator, Civil Service (Medical): No    Lack of Transportation (Non-Medical): No  Physical Activity: Inactive (08/15/2022)   Exercise Vital Sign    Days of Exercise per Week: 0 days  Minutes of Exercise per Session: 0 min  Stress: No Stress Concern Present (08/15/2022)   [employer] of Occupational Health - Occupational Stress Questionnaire    Feeling of Stress : Not at all  Social Connections: Socially Isolated (08/15/2022)   Social Connection and Isolation Panel [NHANES]    Frequency of Communication with Friends and Family: More than three times a week    Frequency of Social Gatherings with Friends and Family: More than three times a week    Attends Religious Services: Never    Database administrator or Organizations: No    Attends Banker Meetings: Never    Marital Status: Divorced  Catering manager Violence: Not At Risk (08/15/2022)   Humiliation, Afraid, Rape, and Kick questionnaire    Fear of Current or Ex-Partner: No    Emotionally Abused: No    Physically Abused: No    Sexually Abused: No     Constitutional: Denies fever, malaise, fatigue, headache or abrupt weight changes.  HEENT: Denies eye pain, eye redness, ear pain, ringing in the ears, wax buildup, runny nose, nasal congestion, bloody nose, or sore throat. Respiratory: Denies difficulty breathing, shortness of breath, cough or sputum production.   Cardiovascular: Denies chest pain, chest tightness, palpitations or swelling in the hands or feet.  Gastrointestinal: Patient reports constipation.  Denies abdominal pain, bloating, diarrhea or blood in the stool.  GU: Denies urgency, frequency, pain with urination, burning sensation, blood in urine, odor or discharge. Musculoskeletal: Patient reports chronic joint and muscle pain.  Denies decrease in range of motion, difficulty with gait, or joint swelling.  Skin: Denies redness, rashes, lesions or ulcercations.  Neurological: Denies dizziness, difficulty with memory, difficulty with speech or problems with balance and coordination.  Psych: Patient has a history of depression.  Denies anxiety, SI/HI.  No other specific complaints in a  complete review of systems (except as listed in HPI above).  Objective:   Physical Exam  There were no vitals taken for this visit. Wt Readings from Last 3 Encounters:  08/15/22 196 lb (88.9 kg)  07/03/22 196 lb 3.2 oz (89 kg)  03/14/22 206 lb (93.4 kg)    General: Appears their stated age, well developed, well nourished in NAD. Skin: Warm, dry and intact. No rashes, lesions or ulcerations noted. HEENT: Head: normal shape and size; Eyes: sclera white, no icterus, conjunctiva pink, PERRLA and EOMs intact; Ears: Tm's gray and intact, normal light reflex; Nose: mucosa pink and moist, septum midline; Throat/Mouth: Teeth present, mucosa pink and moist, no exudate, lesions or ulcerations noted.  Neck:  Neck supple, trachea midline. No masses, lumps or thyromegaly present.  Cardiovascular: Normal rate and rhythm. S1,S2 noted.  No murmur, rubs or gallops noted. No JVD or BLE edema. No carotid bruits noted. Pulmonary/Chest: Normal effort and positive vesicular breath sounds. No respiratory distress. No wheezes, rales or ronchi noted.  Abdomen: Soft and nontender. Normal bowel sounds. No distention or masses noted. Liver, spleen and kidneys non palpable. Musculoskeletal: Normal range of motion. No signs of joint swelling. No difficulty with gait.  Neurological: Alert and oriented. Cranial nerves II-XII grossly intact. Coordination normal.  Psychiatric: Mood and affect normal. Behavior is normal. Judgment and thought content normal.     BMET    Component Value Date/Time   NA 140 07/03/2022 1559   NA 140 03/30/2014 2045   K 4.7 07/03/2022 1559   K 3.7 03/30/2014 2045   CL 98 07/03/2022 1559   CL 106 03/30/2014 2045  CO2 28 07/03/2022 1559   CO2 28 03/30/2014 2045   GLUCOSE 112 (H) 07/03/2022 1559   GLUCOSE 96 03/30/2014 2045   BUN 12 07/03/2022 1559   BUN 7 03/30/2014 2045   CREATININE 0.69 07/03/2022 1559   CALCIUM 9.2 07/03/2022 1559   CALCIUM 8.7 03/30/2014 2045   GFRNONAA >60  03/17/2022 0433   GFRNONAA 102 08/08/2020 1124   GFRAA 118 08/08/2020 1124    Lipid Panel     Component Value Date/Time   CHOL 150 07/03/2022 1559   TRIG 134 07/03/2022 1559   HDL 43 (L) 07/03/2022 1559   CHOLHDL 3.5 07/03/2022 1559   LDLCALC 84 07/03/2022 1559    CBC    Component Value Date/Time   WBC 9.4 07/03/2022 1559   RBC 5.86 (H) 07/03/2022 1559   HGB 17.6 (H) 07/03/2022 1559   HGB 15.5 03/30/2014 2045   HCT 52.8 (H) 07/03/2022 1559   HCT 47.0 03/30/2014 2045   PLT 209 07/03/2022 1559   PLT 174 03/30/2014 2045   MCV 90.1 07/03/2022 1559   MCV 92 03/30/2014 2045   MCH 30.0 07/03/2022 1559   MCHC 33.3 07/03/2022 1559   RDW 12.6 07/03/2022 1559   RDW 15.4 (H) 03/30/2014 2045   LYMPHSABS 0.4 (L) 03/15/2022 0540   MONOABS 0.5 03/15/2022 0540   EOSABS 0.0 03/15/2022 0540   BASOSABS 0.0 03/15/2022 0540    Hgb A1C Lab Results  Component Value Date   HGBA1C 6.1 (H) 07/03/2022            Assessment & Plan:   Preventative health maintenance:  Flu  she declines tetanus for reasons, advised her if she gets bit or cut to go get this done Encouraged her to get her COVID-vaccine Discussed Shingrix vaccine, she will check coverage with her insurance company and schedule visit if she would like to have this done She no longer needs to screen for cervical cancer Mammogram and bone density ordered-she will call to schedule Colon screening Encouraged her to consume a balanced diet and exercise regimen Advised her to see an eye doctor and dentist annually Will check CBC, c-Met, TSH, free T4, lipid, A1c today  RTC in 6 months, follow-up chronic conditions Nicki Reaper, NP

## 2023-01-07 ENCOUNTER — Ambulatory Visit (HOSPITAL_COMMUNITY): Payer: 59

## 2023-01-09 ENCOUNTER — Ambulatory Visit: Payer: 59

## 2023-01-23 ENCOUNTER — Other Ambulatory Visit: Payer: Self-pay | Admitting: Internal Medicine

## 2023-01-23 ENCOUNTER — Ambulatory Visit: Payer: 59

## 2023-01-23 DIAGNOSIS — N3281 Overactive bladder: Secondary | ICD-10-CM

## 2023-01-23 DIAGNOSIS — E785 Hyperlipidemia, unspecified: Secondary | ICD-10-CM

## 2023-01-23 DIAGNOSIS — A6000 Herpesviral infection of urogenital system, unspecified: Secondary | ICD-10-CM

## 2023-01-23 DIAGNOSIS — I10 Essential (primary) hypertension: Secondary | ICD-10-CM

## 2023-01-23 DIAGNOSIS — E7800 Pure hypercholesterolemia, unspecified: Secondary | ICD-10-CM

## 2023-01-23 NOTE — Telephone Encounter (Signed)
Requested Prescriptions  Pending Prescriptions Disp Refills   mirabegron ER (MYRBETRIQ) 50 MG TB24 tablet [Pharmacy Med Name: MYRBETRIQ 50 MG TAB] 90 tablet 2    Sig: TAKE 1 TABLET BY MOUTH DAILY     Urology: Bladder Agents - mirabegron Failed - 01/23/2023  2:38 PM      Failed - AST in normal range and within 360 days    AST  Date Value Ref Range Status  07/03/2022 37 (H) 10 - 35 U/L Final   SGOT(AST)  Date Value Ref Range Status  03/30/2014 18 15 - 37 Unit/L Final         Passed - Cr in normal range and within 360 days    Creat  Date Value Ref Range Status  07/03/2022 0.69 0.50 - 1.05 mg/dL Final         Passed - ALT in normal range and within 360 days    ALT  Date Value Ref Range Status  07/03/2022 25 6 - 29 U/L Final   SGPT (ALT)  Date Value Ref Range Status  03/30/2014 20 U/L Final    Comment:    14-63 NOTE: New Reference Range 11/01/13          Passed - eGFR is 15 or above and within 360 days    GFR, Est African American  Date Value Ref Range Status  08/08/2020 118 > OR = 60 mL/min/1.74m2 Final   GFR, Est Non African American  Date Value Ref Range Status  08/08/2020 102 > OR = 60 mL/min/1.47m2 Final   GFR, Estimated  Date Value Ref Range Status  03/17/2022 >60 >60 mL/min Final    Comment:    (NOTE) Calculated using the CKD-EPI Creatinine Equation (2021)    eGFR  Date Value Ref Range Status  07/03/2022 97 > OR = 60 mL/min/1.62m2 Final         Passed - Last BP in normal range    BP Readings from Last 1 Encounters:  07/03/22 100/74         Passed - Valid encounter within last 12 months    Recent Outpatient Visits           6 months ago Acquired hypothyroidism   [place] [place], Salvadore Oxford, NP   10 months ago Acute respiratory failure with hypoxia [facility])   [place] [facility] [place], Salvadore Oxford, NP   1 year ago Need for influenza vaccination   [place] [facility] [place], Salvadore Oxford,  NP   1 year ago OAB (overactive bladder)   [place] [facility] Smitty Cords, DO   1 year ago Acquired hypothyroidism   [place] [facility] [place], Salvadore Oxford, NP       Future Appointments             In 4 weeks Sam[place] Si, Salvadore Oxford, NP [facility] [place]             acyclovir (ZOVIRAX) 400 MG tablet [Pharmacy Med Name: ACYCLOVIR 400 MG TAB] 90 tablet 2    Sig: TAKE 1 TABLET BY MOUTH DAILY     Antimicrobials:  Antiviral Agents - Anti-Herpetic Passed - 01/23/2023  2:38 PM      Passed - Valid encounter within last 12 months    Recent Outpatient Visits           6 months ago Acquired hypothyroidism   [facility] [place], [place] W, NP   10 months ago Acute respiratory failure with hypoxia [facility])   [place] [facility] [place], Salvadore Oxford, NP   1 year ago Need for influenza vaccination   [place] [facility] [place], Salvadore Oxford, NP   1 year ago OAB (overactive bladder)   [place] [facility] Smitty Cords, DO   1 year ago Acquired hypothyroidism   [place] [facility] [place], Salvadore Oxford, NP       Future Appointments             In 4 weeks Sampson Si, Salvadore Oxford, NP [place] [facility], [facility]             metoprolol succinate (TOPROL-XL) 50 MG 24 hr tablet [Pharmacy Med Name: METOPROLOL SUCCINATE ER 50 MG TAB] 90 tablet 0    Sig: TAKE 1 TABLET BY MOUTH AT BEDTIME     Cardiovascular:  Beta Blockers Passed - 01/23/2023  2:38 PM      Passed - Last BP in normal range    BP Readings from Last 1 Encounters:  07/03/22 100/74         Passed - Last Heart Rate in normal range    Pulse Readings from Last 1 Encounters:  07/03/22 85         Passed - Valid encounter within last 6 months    Recent Outpatient Visits           6 months ago Acquired hypothyroidism   [place] [facility] [place], [place] W, NP   10 months ago Acute respiratory failure with hypoxia [facility])   [place] [facility] [place], Salvadore Oxford, NP   1 year ago Need for influenza vaccination   [place] [facility] [place], Salvadore Oxford, NP   1 year ago OAB (overactive bladder)   [place] [facility] Smitty Cords, DO   1 year ago Acquired hypothyroidism   [place] [facility] [place], Salvadore Oxford, NP       Future Appointments             In 4 weeks [place], Salvadore Oxford, NP [place] [facility], [facility]             rosuvastatin (CRESTOR) 40 MG tablet [Pharmacy Med Name: ROSUVASTATIN CALCIUM 40 MG TAB] 90 tablet 0    Sig: TAKE 1 TABLET BY MOUTH DAILY     Cardiovascular:  Antilipid - Statins 2 Failed - 01/23/2023  2:38 PM      Failed - Lipid Panel in normal range within the last 12 months    Cholesterol  Date Value Ref Range Status  07/03/2022 150 <200 mg/dL Final   LDL Cholesterol (Calc)  Date Value Ref Range Status  07/03/2022 84 mg/dL (calc) Final    Comment:    Reference range: <100 . Desirable range <100 mg/dL for primary prevention;   <70 mg/dL for patients with CHD or diabetic patients  with > or = 2 CHD risk factors. Marland Kitchen LDL-C is now calculated using the Martin-Hopkins  calculation, which is a validated novel method providing  better accuracy than the Friedewald equation in the  estimation of LDL-C.  Horald Pollen et al. Lenox Ahr. 4098;119(14): 2061-2068  (http://education.[facility].com/faq/FAQ164)    HDL  Date Value Ref Range Status  07/03/2022 43 (L) > OR =  50 mg/dL Final   Triglycerides  Date Value Ref Range Status  07/03/2022 134 <150 mg/dL Final         Passed - Cr in normal range and within 360 days    Creat  Date Value Ref Range Status  07/03/2022 0.69 0.50 - 1.05 mg/dL Final         Passed - Patient is not pregnant      Passed - Valid encounter within last 12  months    Recent Outpatient Visits           6 months ago Acquired hypothyroidism   [place] [facility] [place], [place], NP   10 months ago Acute respiratory failure with hypoxia [facility])   [place] [facility] [place], Salvadore Oxford, NP   1 year ago Need for influenza vaccination   [place] [facility] [place], Salvadore Oxford, NP   1 year ago OAB (overactive bladder)   [place] [facility] Smitty Cords, DO   1 year ago Acquired hypothyroidism   [place] [facility] [place], Salvadore Oxford, NP       Future Appointments             In 4 weeks Sampson Si, Salvadore Oxford, NP [place] [facility], [facility]             omega-3 acid ethyl esters (LOVAZA) 1 g capsule [Pharmacy Med Name: OMEGA-3-ACID ETHYL ESTERS 1 GM CAP] 360 capsule 0    Sig: TAKE 2 CAPSULES BY MOUTH 2 TIMES DAILY     Endocrinology:  Nutritional Agents - omega-3 acid ethyl esters Failed - 01/23/2023  2:38 PM      Failed - Lipid Panel in normal range within the last 12 months    Cholesterol  Date Value Ref Range Status  07/03/2022 150 <200 mg/dL Final   LDL Cholesterol (Calc)  Date Value Ref Range Status  07/03/2022 84 mg/dL (calc) Final    Comment:    Reference range: <100 . Desirable range <100 mg/dL for primary prevention;   <70 mg/dL for patients with CHD or diabetic patients  with > or = 2 CHD risk factors. Marland Kitchen LDL-C is now calculated using the Martin-Hopkins  calculation, which is a validated novel method providing  better accuracy than the Friedewald equation in the  estimation of LDL-C.  Horald Pollen et al. Lenox Ahr. 2956;213(08): 2061-2068  (http://education.[facility].com/faq/FAQ164)    HDL  Date Value Ref Range Status  07/03/2022 43 (L) > OR = 50 mg/dL Final   Triglycerides  Date Value Ref Range Status  07/03/2022 134 <150 mg/dL Final         Passed - Valid encounter within last 12 months    Recent  Outpatient Visits           6 months ago Acquired hypothyroidism   [place] [facility] [place], [place], NP   10 months ago Acute respiratory failure with hypoxia [facility])   [place] [facility] [place], Salvadore Oxford, NP   1 year ago Need for influenza vaccination   [place] [facility] [place], Salvadore Oxford, NP   1 year ago OAB (overactive bladder)   [place] [facility] Smitty Cords, DO   1 year ago Acquired hypothyroidism   [place] [facility] [place], Salvadore Oxford, NP       Future Appointments  In 4 weeks [place], Salvadore Oxford, NP [place] [facility]

## 2023-02-03 ENCOUNTER — Other Ambulatory Visit: Payer: Self-pay | Admitting: Internal Medicine

## 2023-02-03 DIAGNOSIS — K219 Gastro-esophageal reflux disease without esophagitis: Secondary | ICD-10-CM

## 2023-02-03 NOTE — Telephone Encounter (Signed)
Medication Refill - Medication: esomeprazole (NEXIUM) 40 MG capsule   Has the patient contacted their pharmacy? Yes.   Thayer Ohm called for new script  Preferred Pharmacy (with phone number or street name):  [facility] - [place], [place] - [address] Phone: [phone]  Fax: [phone]     Has the patient been seen for an appointment in the last year OR does the patient have an upcoming appointment? Yes.    Agent: Please be advised that RX refills may take up to 3 business days. We ask that you follow-up with your pharmacy.

## 2023-02-05 MED ORDER — ESOMEPRAZOLE MAGNESIUM 40 MG PO CPDR: 40.0000 mg | DELAYED_RELEASE_CAPSULE | Freq: Every day | ORAL | 0 refills | Status: DC

## 2023-02-05 NOTE — Telephone Encounter (Signed)
Requested Prescriptions  Pending Prescriptions Disp Refills   esomeprazole (NEXIUM) 40 MG capsule 90 capsule 0    Sig: Take 1 capsule (40 mg total) by mouth daily.     Gastroenterology: Proton Pump Inhibitors 2 Failed - 02/03/2023  5:36 PM      Failed - AST in normal range and within 360 days    AST  Date Value Ref Range Status  07/03/2022 37 (H) 10 - 35 U/L Final   SGOT(AST)  Date Value Ref Range Status  03/30/2014 18 15 - 37 Unit/L Final         Passed - ALT in normal range and within 360 days    ALT  Date Value Ref Range Status  07/03/2022 25 6 - 29 U/L Final   SGPT (ALT)  Date Value Ref Range Status  03/30/2014 20 U/L Final    Comment:    14-63 NOTE: New Reference Range 11/01/13          Passed - Valid encounter within last 12 months    Recent Outpatient Visits           7 months ago Acquired hypothyroidism   [place] [facility] [place], [place], NP   10 months ago Acute respiratory failure with hypoxia [facility])   [place] [facility] [place], Salvadore Oxford, NP   1 year ago Need for influenza vaccination   [place] [facility] [place], Salvadore Oxford, NP   1 year ago OAB (overactive bladder)   [place] [facility] Smitty Cords, DO   1 year ago Acquired hypothyroidism   [place] [facility] [place], Salvadore Oxford, NP       Future Appointments             In 2 weeks Sampson Si, Salvadore Oxford, NP  [facility]

## 2023-02-11 DIAGNOSIS — Z79891 Long term (current) use of opiate analgesic: Secondary | ICD-10-CM | POA: Diagnosis not present

## 2023-02-11 DIAGNOSIS — K5903 Drug induced constipation: Secondary | ICD-10-CM | POA: Diagnosis not present

## 2023-02-11 DIAGNOSIS — Z79899 Other long term (current) drug therapy: Secondary | ICD-10-CM | POA: Diagnosis not present

## 2023-02-11 DIAGNOSIS — G894 Chronic pain syndrome: Secondary | ICD-10-CM | POA: Diagnosis not present

## 2023-02-11 DIAGNOSIS — F17210 Nicotine dependence, cigarettes, uncomplicated: Secondary | ICD-10-CM | POA: Diagnosis not present

## 2023-02-11 DIAGNOSIS — M5416 Radiculopathy, lumbar region: Secondary | ICD-10-CM | POA: Diagnosis not present

## 2023-02-11 DIAGNOSIS — M542 Cervicalgia: Secondary | ICD-10-CM | POA: Diagnosis not present

## 2023-02-20 ENCOUNTER — Ambulatory Visit (INDEPENDENT_AMBULATORY_CARE_PROVIDER_SITE_OTHER): Payer: 59 | Admitting: Internal Medicine

## 2023-02-20 ENCOUNTER — Encounter: Payer: Self-pay | Admitting: Internal Medicine

## 2023-02-20 VITALS — BP 154/96 | Ht 66.0 in | Wt 214.0 lb

## 2023-02-20 DIAGNOSIS — Z6834 Body mass index (BMI) 34.0-34.9, adult: Secondary | ICD-10-CM

## 2023-02-20 DIAGNOSIS — Z0001 Encounter for general adult medical examination with abnormal findings: Secondary | ICD-10-CM | POA: Diagnosis not present

## 2023-02-20 DIAGNOSIS — E039 Hypothyroidism, unspecified: Secondary | ICD-10-CM | POA: Diagnosis not present

## 2023-02-20 DIAGNOSIS — E782 Mixed hyperlipidemia: Secondary | ICD-10-CM | POA: Diagnosis not present

## 2023-02-20 DIAGNOSIS — Z23 Encounter for immunization: Secondary | ICD-10-CM | POA: Diagnosis not present

## 2023-02-20 DIAGNOSIS — Z1211 Encounter for screening for malignant neoplasm of colon: Secondary | ICD-10-CM | POA: Diagnosis not present

## 2023-02-20 DIAGNOSIS — E66811 Obesity, class 1: Secondary | ICD-10-CM

## 2023-02-20 DIAGNOSIS — R7309 Other abnormal glucose: Secondary | ICD-10-CM | POA: Diagnosis not present

## 2023-02-20 DIAGNOSIS — E6609 Other obesity due to excess calories: Secondary | ICD-10-CM

## 2023-02-20 NOTE — Progress Notes (Unsigned)
Subjective:    Patient ID: April Soto, female    DOB: June 28, 1960, 62 y.o.   MRN: [id number]  HPI  Patient presents to clinic today for her annual exam.  Flu: 12/2021 Tetanus: unsure COVID: never Pneumovax: > 5 years ago Shingrix: never Pap smear: Hysterectomy Mammogram: 03/2018 Bone density: Never Colon screening: Vision screening: Dentist:  Diet: She does eat meat. She consumes fruits and veggies. She does eat fried foods. She drinks mostly water, soda. Exercise: Walking   Review of Systems     Past Medical History:  Diagnosis Date   Anemia    "years ago"   Anxiety    takes Ativan daily   Asthma    Bipolar 1 disorder (HCC)    Chronic back pain    HNP   Constipation    takes Ra Col Rite daily   Degenerative disc disease    Depression    was on Prozac but taken off by MD for a couple of weeks   Depression    Fibromyalgia    takes Lyrica daily   Genital herpes    GERD (gastroesophageal reflux disease)    takes Nexium daily   Headache(784.0)    Heart murmur    "grew out of"   History of bronchitis    last time 15+yrs ago   History of rheumatic fever    Hyperlipidemia    takes Simvastatin daily   Hypertension    takes Coreg daily   Hypothyroidism    takes Synthroid daily   Joint pain    Muscle spasm    takes Flexeril daily as needed   Nocturia    Osteoarthritis    Peripheral edema    takes HCTZ daily   Pneumonia    hx of;last time 30+yrs ago   Raynaud's disease    Rheumatic fever    as a child   Seasonal allergies    takes Zyrtec daily and uses Dymista as well   Stroke (HCC)    ? 5 years ago. No lasting deficits   Urinary incontinence    Weakness    nmbness and tingling-right leg    Current Outpatient Medications  Medication Sig Dispense Refill   acyclovir (ZOVIRAX) 400 MG tablet TAKE 1 TABLET BY MOUTH DAILY 90 tablet 2   albuterol (VENTOLIN HFA) 108 (90 Base) MCG/ACT inhaler Inhale 2 puffs into the lungs every 4 (four) hours as  needed for wheezing or shortness of breath.      aspirin EC 81 MG tablet Take 1 tablet (81 mg total) by mouth daily. Swallow whole. 30 tablet 12   cetirizine (ZYRTEC) 10 MG tablet Take 1 tablet (10 mg total) by mouth daily. 90 tablet 1   diclofenac (VOLTAREN) 75 MG EC tablet Take 1 tablet (75 mg total) by mouth 2 (two) times daily. 180 tablet 1   esomeprazole (NEXIUM) 40 MG capsule Take 1 capsule (40 mg total) by mouth daily. 90 capsule 0   fluticasone (FLONASE) 50 MCG/ACT nasal spray INSTILL 1 SPRAY IN EACH NOSTRIL DAILY ASNEEDED FOR SEASONAL ALLERGY SYMPTOMS 48 g 1   hydrocortisone (ANUSOL-HC) 2.5 % rectal cream Apply 1 application topically as needed for hemorrhoids.      hydrOXYzine (ATARAX) 25 MG tablet Take 25 mg by mouth 2 (two) times daily.     levothyroxine (SYNTHROID) 88 MCG tablet TAKE 1 TABLET BY MOUTH DAILY BEFORE BREAKFAST. 90 tablet 1   metoprolol succinate (TOPROL-XL) 50 MG 24 hr tablet TAKE 1 TABLET BY  MOUTH AT BEDTIME 90 tablet 0   mirabegron ER (MYRBETRIQ) 50 MG TB24 tablet TAKE 1 TABLET BY MOUTH DAILY 90 tablet 2   morphine (MS CONTIN) 15 MG 12 hr tablet Take 15 mg by mouth 3 (three) times daily.     MOVANTIK 25 MG TABS tablet Take 25 mg by mouth daily.     nicotine (NICODERM CQ - DOSED IN MG/24 HOURS) 21 mg/24hr patch Place 1 patch (21 mg total) onto the skin daily. (Patient not taking: Reported on 08/15/2022) 28 patch 0   Olopatadine HCl 0.2 % SOLN Place 1 drop into both eyes at bedtime as needed (allergies).     omega-3 acid ethyl esters (LOVAZA) 1 g capsule TAKE 2 CAPSULES BY MOUTH 2 TIMES DAILY 360 capsule 0   ondansetron (ZOFRAN-ODT) 4 MG disintegrating tablet Take 1 tablet (4 mg total) by mouth every 8 (eight) hours as needed for nausea or vomiting. 20 tablet 0   Oxycodone HCl 10 MG TABS Take 10 mg by mouth 4 (four) times daily as needed.     pregabalin (LYRICA) 100 MG capsule Take 100 mg by mouth 3 (three) times daily.     rosuvastatin (CRESTOR) 40 MG tablet TAKE 1 TABLET  BY MOUTH DAILY 90 tablet 0   SYMBICORT 160-4.5 MCG/ACT inhaler INHALE 2 PUFFS BY MOUTH TWICE DAILY (MAINTENANCE INHALER). RINSE MOUTH WELL AFTER USE TO PREVENT THRUSH. 10.2 g 5   venlafaxine XR (EFFEXOR-XR) 150 MG 24 hr capsule Take 1 capsule (150 mg total) by mouth every evening. For mood control 30 capsule 0   ziprasidone (GEODON) 40 MG capsule TAKE 1 CAPSULE BY MOUTH EVERY EVENING WITH FOOD FOR MOOD 90 capsule 0   No current facility-administered medications for this visit.    No Known Allergies  Family History  Problem Relation Age of Onset   Diabetes type II Mother    Hypertension Mother    Diabetes Mother    Diabetes Sister    Hypertension Sister    Hypertension Maternal Grandmother    Diabetes Maternal Grandmother    Breast cancer Neg Hx     Social History   Socioeconomic History   Marital status: Divorced    Spouse name: Not on file   Number of children: Not on file   Years of education: Not on file   Highest education level: Not on file  Occupational History   Not on file  Tobacco Use   Smoking status: Some Days    Current packs/day: 1.00    Average packs/day: 1 pack/day for 40.0 years (40.0 ttl pk-yrs)    Types: Cigarettes   Smokeless tobacco: Never  Vaping Use   Vaping status: Never Used  Substance and Sexual Activity   Alcohol use: No   Drug use: No   Sexual activity: Not Currently    Birth control/protection: Surgical  Other Topics Concern   Not on file  Social History Narrative   Not on file   Social Determinants of Health   Financial Resource Strain: Low Risk  (08/15/2022)   Overall Financial Resource Strain (CARDIA)    Difficulty of Paying Living Expenses: Not hard at all  Food Insecurity: No Food Insecurity (08/15/2022)   Hunger Vital Sign    Worried About Running Out of Food in the Last Year: Never true    Ran Out of Food in the Last Year: Never true  Transportation Needs: No Transportation Needs (08/15/2022)   PRAPARE - Doctor, general practice (Medical):  No    Lack of Transportation (Non-Medical): No  Physical Activity: Inactive (08/15/2022)   Exercise Vital Sign    Days of Exercise per Week: 0 days    Minutes of Exercise per Session: 0 min  Stress: No Stress Concern Present (08/15/2022)   [employer] of Occupational Health - Occupational Stress Questionnaire    Feeling of Stress : Not at all  Social Connections: Socially Isolated (08/15/2022)   Social Connection and Isolation Panel [NHANES]    Frequency of Communication with Friends and Family: More than three times a week    Frequency of Social Gatherings with Friends and Family: More than three times a week    Attends Religious Services: Never    Database administrator or Organizations: No    Attends Banker Meetings: Never    Marital Status: Divorced  Catering manager Violence: Not At Risk (08/15/2022)   Humiliation, Afraid, Rape, and Kick questionnaire    Fear of Current or Ex-Partner: No    Emotionally Abused: No    Physically Abused: No    Sexually Abused: No     Constitutional: Denies fever, malaise, fatigue, headache or abrupt weight changes.  HEENT: Denies eye pain, eye redness, ear pain, ringing in the ears, wax buildup, runny nose, nasal congestion, bloody nose, or sore throat. Respiratory: Denies difficulty breathing, shortness of breath, cough or sputum production.   Cardiovascular: Denies chest pain, chest tightness, palpitations or swelling in the hands or feet.  Gastrointestinal: Patient reports chronic constipation.  Denies abdominal pain, bloating, diarrhea or blood in the stool.  GU: Denies urgency, frequency, pain with urination, burning sensation, blood in urine, odor or discharge. Musculoskeletal: Patient reports chronic muscle and joint pain.  Denies decrease in range of motion, difficulty with gait,  or joint swelling.  Skin: Denies redness, rashes, lesions or ulcercations.  Neurological: Denies dizziness, difficulty  with memory, difficulty with speech or problems with balance and coordination.  Psych: Denies anxiety, depression, SI/HI.  No other specific complaints in a complete review of systems (except as listed in HPI above).  Objective:   Physical Exam  There were no vitals taken for this visit. Wt Readings from Last 3 Encounters:  08/15/22 196 lb (88.9 kg)  07/03/22 196 lb 3.2 oz (89 kg)  03/14/22 206 lb (93.4 kg)    General: Appears their stated age, well developed, well nourished in NAD. Skin: Warm, dry and intact. No rashes, lesions or ulcerations noted. HEENT: Head: normal shape and size; Eyes: sclera white, no icterus, conjunctiva pink, PERRLA and EOMs intact; Ears: Tm's gray and intact, normal light reflex; Nose: mucosa pink and moist, septum midline; Throat/Mouth: Teeth present, mucosa pink and moist, no exudate, lesions or ulcerations noted.  Neck:  Neck supple, trachea midline. No masses, lumps or thyromegaly present.  Cardiovascular: Normal rate and rhythm. S1,S2 noted.  No murmur, rubs or gallops noted. No JVD or BLE edema. No carotid bruits noted. Pulmonary/Chest: Normal effort and positive vesicular breath sounds. No respiratory distress. No wheezes, rales or ronchi noted.  Abdomen: Soft and nontender. Normal bowel sounds. No distention or masses noted. Liver, spleen and kidneys non palpable. Musculoskeletal: Normal range of motion. No signs of joint swelling. No difficulty with gait.  Neurological: Alert and oriented. Cranial nerves II-XII grossly intact. Coordination normal.  Psychiatric: Mood and affect normal. Behavior is normal. Judgment and thought content normal.    BMET    Component Value Date/Time   NA 140 07/03/2022 1559   NA  140 03/30/2014 2045   K 4.7 07/03/2022 1559   K 3.7 03/30/2014 2045   CL 98 07/03/2022 1559   CL 106 03/30/2014 2045   CO2 28 07/03/2022 1559   CO2 28 03/30/2014 2045   GLUCOSE 112 (H) 07/03/2022 1559   GLUCOSE 96 03/30/2014 2045   BUN 12  07/03/2022 1559   BUN 7 03/30/2014 2045   CREATININE 0.69 07/03/2022 1559   CALCIUM 9.2 07/03/2022 1559   CALCIUM 8.7 03/30/2014 2045   GFRNONAA >60 03/17/2022 0433   GFRNONAA 102 08/08/2020 1124   GFRAA 118 08/08/2020 1124    Lipid Panel     Component Value Date/Time   CHOL 150 07/03/2022 1559   TRIG 134 07/03/2022 1559   HDL 43 (L) 07/03/2022 1559   CHOLHDL 3.5 07/03/2022 1559   LDLCALC 84 07/03/2022 1559    CBC    Component Value Date/Time   WBC 9.4 07/03/2022 1559   RBC 5.86 (H) 07/03/2022 1559   HGB 17.6 (H) 07/03/2022 1559   HGB 15.5 03/30/2014 2045   HCT 52.8 (H) 07/03/2022 1559   HCT 47.0 03/30/2014 2045   PLT 209 07/03/2022 1559   PLT 174 03/30/2014 2045   MCV 90.1 07/03/2022 1559   MCV 92 03/30/2014 2045   MCH 30.0 07/03/2022 1559   MCHC 33.3 07/03/2022 1559   RDW 12.6 07/03/2022 1559   RDW 15.4 (H) 03/30/2014 2045   LYMPHSABS 0.4 (L) 03/15/2022 0540   MONOABS 0.5 03/15/2022 0540   EOSABS 0.0 03/15/2022 0540   BASOSABS 0.0 03/15/2022 0540    Hgb A1C Lab Results  Component Value Date   HGBA1C 6.1 (H) 07/03/2022            Assessment & Plan:   Preventative health maintenance:  Flu Tetanus Encouraged her to get his COVID-vaccine Discussed Shingrix vaccine, she will check coverage with her insurance, and schedule visit if she would like to have this done She no longer needs to screen for cervical cancer Mammo Ram and density ordered-she will call to schedule Colon screening Encouraged her to consume a balanced diet and exercise regimen Advised her to see an eye doctor and dentist annually We will check CBC, c-Met, TSH, free T4, lipid, A1c today  RTC in 6 months, follow-up chronic conditions Nicki Reaper, NP

## 2023-02-21 LAB — HEMOGLOBIN A1C
Hgb A1c MFr Bld: 5.8 %{Hb} — ABNORMAL HIGH (ref <5.7)
Mean Plasma Glucose: 120 mg/dL
eAG (mmol/L): 6.6 mmol/L

## 2023-02-21 LAB — TSH: TSH: 4.13 m[IU]/L (ref 0.40–4.50)

## 2023-02-21 LAB — LIPID PANEL
Cholesterol: 147 mg/dL (ref <200)
HDL: 53 mg/dL (ref >=50)
LDL Cholesterol (Calc): 74 mg/dL
Non-HDL Cholesterol (Calc): 94 mg/dL (ref <130)
Total CHOL/HDL Ratio: 2.8 (calc) (ref <5.0)
Triglycerides: 122 mg/dL (ref <150)

## 2023-02-21 LAB — CBC
HCT: 49.6 % — ABNORMAL HIGH (ref 35.0–45.0)
Hemoglobin: 16.2 g/dL — ABNORMAL HIGH (ref 11.7–15.5)
MCH: 30.6 pg (ref 27.0–33.0)
MCHC: 32.7 g/dL (ref 32.0–36.0)
MCV: 93.6 fL (ref 80.0–100.0)
MPV: 12.2 fL (ref 7.5–12.5)
Platelets: 138 10*3/uL — ABNORMAL LOW (ref 140–400)
RBC: 5.3 10*6/uL — ABNORMAL HIGH (ref 3.80–5.10)
RDW: 12.7 % (ref 11.0–15.0)
WBC: 7.3 10*3/uL (ref 3.8–10.8)

## 2023-02-21 LAB — COMPLETE METABOLIC PANEL WITH GFR
AG Ratio: 1.4 (calc) (ref 1.0–2.5)
ALT: 13 U/L (ref 6–29)
AST: 16 U/L (ref 10–35)
Albumin: 3.7 g/dL (ref 3.6–5.1)
Alkaline phosphatase (APISO): 91 U/L (ref 37–153)
BUN: 13 mg/dL (ref 7–25)
CO2: 26 mmol/L (ref 20–32)
Calcium: 8.9 mg/dL (ref 8.6–10.4)
Chloride: 104 mmol/L (ref 98–110)
Creat: 0.69 mg/dL (ref 0.50–1.05)
Globulin: 2.7 g/dL (ref 1.9–3.7)
Glucose, Bld: 111 mg/dL — ABNORMAL HIGH (ref 65–99)
Potassium: 4.6 mmol/L (ref 3.5–5.3)
Sodium: 141 mmol/L (ref 135–146)
Total Bilirubin: 0.6 mg/dL (ref 0.2–1.2)
Total Protein: 6.4 g/dL (ref 6.1–8.1)
eGFR: 97 mL/min/{1.73_m2} (ref >=60)

## 2023-02-21 LAB — T4, FREE: Free T4: 1.3 ng/dL (ref 0.8–1.8)

## 2023-02-23 ENCOUNTER — Encounter: Payer: Self-pay | Admitting: Internal Medicine

## 2023-02-23 NOTE — Assessment & Plan Note (Signed)
Encouraged diet and exercise for weight loss ?

## 2023-02-23 NOTE — Patient Instructions (Signed)
Health Maintenance for Postmenopausal Women Menopause is a normal process in which your ability to get pregnant comes to an end. This process happens slowly over many months or years, usually between the ages of 48 and 55. Menopause is complete when you have missed your menstrual period for 12 months. It is important to talk with your health care provider about some of the most common conditions that affect women after menopause (postmenopausal women). These include heart disease, cancer, and bone loss (osteoporosis). Adopting a healthy lifestyle and getting preventive care can help to promote your health and wellness. The actions you take can also lower your chances of developing some of these common conditions. What are the signs and symptoms of menopause? During menopause, you may have the following symptoms: Hot flashes. These can be moderate or severe. Night sweats. Decrease in sex drive. Mood swings. Headaches. Tiredness (fatigue). Irritability. Memory problems. Problems falling asleep or staying asleep. Talk with your health care provider about treatment options for your symptoms. Do I need hormone replacement therapy? Hormone replacement therapy is effective in treating symptoms that are caused by menopause, such as hot flashes and night sweats. Hormone replacement carries certain risks, especially as you become older. If you are thinking about using estrogen or estrogen with progestin, discuss the benefits and risks with your health care provider. How can I reduce my risk for heart disease and stroke? The risk of heart disease, heart attack, and stroke increases as you age. One of the causes may be a change in the body's hormones during menopause. This can affect how your body uses dietary fats, triglycerides, and cholesterol. Heart attack and stroke are medical emergencies. There are many things that you can do to help prevent heart disease and stroke. Watch your blood pressure High  blood pressure causes heart disease and increases the risk of stroke. This is more likely to develop in people who have high blood pressure readings or are overweight. Have your blood pressure checked: Every 3-5 years if you are 18-39 years of age. Every year if you are 40 years old or older. Eat a healthy diet  Eat a diet that includes plenty of vegetables, fruits, low-fat dairy products, and lean protein. Do not eat a lot of foods that are high in solid fats, added sugars, or sodium. Get regular exercise Get regular exercise. This is one of the most important things you can do for your health. Most adults should: Try to exercise for at least 150 minutes each week. The exercise should increase your heart rate and make you sweat (moderate-intensity exercise). Try to do strengthening exercises at least twice each week. Do these in addition to the moderate-intensity exercise. Spend less time sitting. Even light physical activity can be beneficial. Other tips Work with your health care provider to achieve or maintain a healthy weight. Do not use any products that contain nicotine or tobacco. These products include cigarettes, chewing tobacco, and vaping devices, such as e-cigarettes. If you need help quitting, ask your health care provider. Know your numbers. Ask your health care provider to check your cholesterol and your blood sugar (glucose). Continue to have your blood tested as directed by your health care provider. Do I need screening for cancer? Depending on your health history and family history, you may need to have cancer screenings at different stages of your life. This may include screening for: Breast cancer. Cervical cancer. Lung cancer. Colorectal cancer. What is my risk for osteoporosis? After menopause, you may be   at increased risk for osteoporosis. Osteoporosis is a condition in which bone destruction happens more quickly than new bone creation. To help prevent osteoporosis or  the bone fractures that can happen because of osteoporosis, you may take the following actions: If you are 19-50 years old, get at least 1,000 mg of calcium and at least 600 international units (IU) of vitamin D per day. If you are older than age 50 but younger than age 70, get at least 1,200 mg of calcium and at least 600 international units (IU) of vitamin D per day. If you are older than age 70, get at least 1,200 mg of calcium and at least 800 international units (IU) of vitamin D per day. Smoking and drinking excessive alcohol increase the risk of osteoporosis. Eat foods that are rich in calcium and vitamin D, and do weight-bearing exercises several times each week as directed by your health care provider. How does menopause affect my mental health? Depression may occur at any age, but it is more common as you become older. Common symptoms of depression include: Feeling depressed. Changes in sleep patterns. Changes in appetite or eating patterns. Feeling an overall lack of motivation or enjoyment of activities that you previously enjoyed. Frequent crying spells. Talk with your health care provider if you think that you are experiencing any of these symptoms. General instructions See your health care provider for regular wellness exams and vaccines. This may include: Scheduling regular health, dental, and eye exams. Getting and maintaining your vaccines. These include: Influenza vaccine. Get this vaccine each year before the flu season begins. Pneumonia vaccine. Shingles vaccine. Tetanus, diphtheria, and pertussis (Tdap) booster vaccine. Your health care provider may also recommend other immunizations. Tell your health care provider if you have ever been abused or do not feel safe at home. Summary Menopause is a normal process in which your ability to get pregnant comes to an end. This condition causes hot flashes, night sweats, decreased interest in sex, mood swings, headaches, or lack  of sleep. Treatment for this condition may include hormone replacement therapy. Take actions to keep yourself healthy, including exercising regularly, eating a healthy diet, watching your weight, and checking your blood pressure and blood sugar levels. Get screened for cancer and depression. Make sure that you are up to date with all your vaccines. This information is not intended to replace advice given to you by your health care provider. Make sure you discuss any questions you have with your health care provider. Document Revised: 08/20/2020 Document Reviewed: 08/20/2020 Elsevier Patient Education  2024 [employer]vier Inc.  

## 2023-04-01 ENCOUNTER — Ambulatory Visit: Payer: Self-pay | Admitting: *Deleted

## 2023-04-01 NOTE — Telephone Encounter (Signed)
There are no good treatments for IBS with diarrhea.  All we have is antidiarrheals which include Imodium over-the-counter.  We can prescribe Lomotil if Imodium does not work but this will require an office visit to prescribe.

## 2023-04-01 NOTE — Telephone Encounter (Signed)
Spoke with patient  Informed her to continue Over the counter medications.   Suggested an appointment if she felt she needed to be prescribed something  She denied making an appointment.  "Hard to get a ride"  Verbal understanding from patient

## 2023-04-01 NOTE — Telephone Encounter (Signed)
Attempted to return her call.   Voicemail left to call back.

## 2023-04-01 NOTE — Telephone Encounter (Signed)
Message from Clide Dales sent at 04/01/2023 10:36 AM EST  Summary: Diarrhea   Patient states that she has been having diarrhea every morning for the past month. Patient is requesting advice and a medication to help.          Call History  Contact Date/Time Type Contact Phone/Fax By  04/01/2023 10:34 AM EST Phone (Incoming) April Soto, April Soto (Self) [phone] Judie Petit) Mabe, [name]

## 2023-04-01 NOTE — Telephone Encounter (Signed)
Chief Complaint: Diarrhea  Symptoms: diarrhea  Frequency: onset about 1 month ago daily  Pertinent Negatives: Patient denies blood in stool, abdominal pain  Disposition: [] ED /[] Urgent Care (no appt availability in office) / [] Appointment(In office/virtual)/ []  [place] [facility]/ [] Home Care/ [x] Refused Recommended Disposition /[] [place] [facility]/ []  Follow-up with PCP Additional Notes: Patient states she has been experiencing diarrhea episodes daily for about 1 month. Patient reports 4 episodes in the past 24 hours. Patient states  she has tried OTC treatments without improvement. Patient states she believes the symptoms are related to her IBS. Patient states she knows her body and IBS is causing the diarrhea. Care advice given and patient offered an appointment. Patient declined the appointment stating she just wants something sent to her pharmacy at [facility]. Advised I would forward to PCP for recommendations.   Summary: Diarrhea   Patient states that she has been having diarrhea every morning for the past month. Patient is requesting advice and a medication to help.       Reason for Disposition  [1] Mild diarrhea (e.g., 1-3 or more stools than normal in past 24 hours) without known cause AND [2] present >  7 days  Answer Assessment - Initial Assessment Questions 1. DIARRHEA SEVERITY: "How bad is the diarrhea?" "How many more stools have you had in the past 24 hours than normal?"    - NO DIARRHEA (SCALE 0)   - MILD (SCALE 1-3): Few loose or mushy BMs; increase of 1-3 stools over normal daily number of stools; mild increase in ostomy output.   -  MODERATE (SCALE 4-7): Increase of 4-6 stools daily over normal; moderate increase in ostomy output.   -  SEVERE (SCALE 8-10; OR "WORST POSSIBLE"): Increase of 7 or more stools daily over normal; moderate increase in ostomy output; incontinence.     4  2. ONSET: "When did the diarrhea begin?"      1 month ago  3. BM  CONSISTENCY: "How loose or watery is the diarrhea?"      Watery  4. VOMITING: "Are you also vomiting?" If Yes, ask: "How many times in the past 24 hours?"      No 5. ABDOMEN PAIN: "Are you having any abdomen pain?" If Yes, ask: "What does it feel like?" (e.g., crampy, dull, intermittent, constant)      No  6. ABDOMEN PAIN SEVERITY: If present, ask: "How bad is the pain?"  (e.g., Scale 1-10; mild, moderate, or severe)   - MILD (1-3): doesn't interfere with normal activities, abdomen soft and not tender to touch    - MODERATE (4-7): interferes with normal activities or awakens from sleep, abdomen tender to touch    - SEVERE (8-10): excruciating pain, doubled over, unable to do any normal activities       N/A 7. ORAL INTAKE: If vomiting, "Have you been able to drink liquids?" "How much liquids have you had in the past 24 hours?"     N/A 8. HYDRATION: "Any signs of dehydration?" (e.g., dry mouth [not just dry lips], too weak to stand, dizziness, new weight loss) "When did you last urinate?"     No  9. EXPOSURE: "Have you traveled to a foreign country recently?" "Have you been exposed to anyone with diarrhea?" "Could you have eaten any food that was spoiled?"     No  10. ANTIBIOTIC USE: "Are you taking antibiotics now or have you taken antibiotics in the past 2 months?"  No  11. OTHER SYMPTOMS: "Do you have any other symptoms?" (e.g., fever, blood in stool)       No  Protocols used: Diarrhea-A-AH

## 2023-04-10 ENCOUNTER — Other Ambulatory Visit: Payer: Self-pay | Admitting: Internal Medicine

## 2023-04-10 DIAGNOSIS — E785 Hyperlipidemia, unspecified: Secondary | ICD-10-CM

## 2023-04-15 NOTE — Telephone Encounter (Signed)
 Requested Prescriptions  Pending Prescriptions Disp Refills   rosuvastatin  (CRESTOR ) 40 MG tablet [Pharmacy Med Name: ROSUVASTATIN  CALCIUM  40MG  TABLET] 90 tablet 0    Sig: TAKE 1 TABLET BY MOUTH ONCE A DAY     Cardiovascular:  Antilipid - Statins 2 Failed - 04/15/2023 12:07 PM      Failed - Lipid Panel in normal range within the last 12 months    Cholesterol  Date Value Ref Range Status  02/20/2023 147 <200 mg/dL Final   LDL Cholesterol (Calc)  Date Value Ref Range Status  02/20/2023 74 mg/dL (calc) Final    Comment:    Reference range: <100 . Desirable range <100 mg/dL for primary prevention;   <70 mg/dL for patients with CHD or diabetic patients  with > or = 2 CHD risk factors. SABRA LDL-C is now calculated using the Martin-Hopkins  calculation, which is a validated novel method providing  better accuracy than the Friedewald equation in the  estimation of LDL-C.  Gladis APPLETHWAITE et al. SANDREA. 7986;689(80): 2061-2068  (http://education.[facility].com/faq/FAQ164)    HDL  Date Value Ref Range Status  02/20/2023 53 > OR = 50 mg/dL Final   Triglycerides  Date Value Ref Range Status  02/20/2023 122 <150 mg/dL Final         Passed - Cr in normal range and within 360 days    Creat  Date Value Ref Range Status  02/20/2023 0.69 0.50 - 1.05 mg/dL Final         Passed - Patient is not pregnant      Passed - Valid encounter within last 12 months    Recent Outpatient Visits           1 month ago Encounter for general adult medical examination with abnormal findings   [place] [facility] [place], Angeline ORN, NP   9 months ago Acquired hypothyroidism   [place] [facility] [place], Angeline ORN, NP   1 year ago Acute respiratory failure with hypoxia [facility])   [place] [facility] [place], Angeline ORN, NP   1 year ago Need for influenza vaccination   [place] [facility] [place], Angeline ORN, NP   1 year ago OAB (overactive  bladder)   [place] [facility] [place] Marsa PARAS, DO       Future Appointments             In 4 months Baity, Angeline ORN, NP  [facility], [facility]

## 2023-04-23 DIAGNOSIS — G894 Chronic pain syndrome: Secondary | ICD-10-CM | POA: Diagnosis not present

## 2023-04-23 DIAGNOSIS — M5416 Radiculopathy, lumbar region: Secondary | ICD-10-CM | POA: Diagnosis not present

## 2023-04-23 DIAGNOSIS — Z79899 Other long term (current) drug therapy: Secondary | ICD-10-CM | POA: Diagnosis not present

## 2023-04-23 DIAGNOSIS — F17210 Nicotine dependence, cigarettes, uncomplicated: Secondary | ICD-10-CM | POA: Diagnosis not present

## 2023-04-23 DIAGNOSIS — M542 Cervicalgia: Secondary | ICD-10-CM | POA: Diagnosis not present

## 2023-04-23 DIAGNOSIS — K5903 Drug induced constipation: Secondary | ICD-10-CM | POA: Diagnosis not present

## 2023-04-23 DIAGNOSIS — Z79891 Long term (current) use of opiate analgesic: Secondary | ICD-10-CM | POA: Diagnosis not present

## 2023-05-04 ENCOUNTER — Other Ambulatory Visit: Payer: Self-pay | Admitting: Internal Medicine

## 2023-05-04 DIAGNOSIS — K219 Gastro-esophageal reflux disease without esophagitis: Secondary | ICD-10-CM

## 2023-05-04 NOTE — Telephone Encounter (Signed)
Requested Prescriptions  Pending Prescriptions Disp Refills   esomeprazole (NEXIUM) 40 MG capsule [Pharmacy Med Name: ESOMEPRAZOLE MAGNESIUM 40MG  DR CAPSULE DR] 90 capsule 0    Sig: TAKE ONE CAPSULE (40 MG TOTAL) BY MOUTH DAILY.     Gastroenterology: Proton Pump Inhibitors 2 Passed - 05/04/2023  4:16 PM      Passed - ALT in normal range and within 360 days    ALT  Date Value Ref Range Status  02/20/2023 13 6 - 29 U/L Final   SGPT (ALT)  Date Value Ref Range Status  03/30/2014 20 U/L Final    Comment:    14-63 NOTE: New Reference Range 11/01/13          Passed - AST in normal range and within 360 days    AST  Date Value Ref Range Status  02/20/2023 16 10 - 35 U/L Final   SGOT(AST)  Date Value Ref Range Status  03/30/2014 18 15 - 37 Unit/L Final         Passed - Valid encounter within last 12 months    Recent Outpatient Visits           2 months ago Encounter for general adult medical examination with abnormal findings   [place] [facility] [place], Salvadore Oxford, NP   10 months ago Acquired hypothyroidism   [place] [facility] [place], Salvadore Oxford, NP   1 year ago Acute respiratory failure with hypoxia [facility])   [place] [facility] [place], Salvadore Oxford, NP   1 year ago Need for influenza vaccination   [facility] [place], Salvadore Oxford, NP   1 year ago OAB (overactive bladder)   [place] [facility] S[place]ty Cords, DO       Future Appointments             In 3 months Baity, Salvadore Oxford, NP [place] [facility], [facility]

## 2023-05-14 ENCOUNTER — Other Ambulatory Visit: Payer: Self-pay | Admitting: Internal Medicine

## 2023-05-14 DIAGNOSIS — E7800 Pure hypercholesterolemia, unspecified: Secondary | ICD-10-CM

## 2023-05-14 DIAGNOSIS — A6000 Herpesviral infection of urogenital system, unspecified: Secondary | ICD-10-CM

## 2023-05-14 DIAGNOSIS — I10 Essential (primary) hypertension: Secondary | ICD-10-CM

## 2023-05-14 DIAGNOSIS — E785 Hyperlipidemia, unspecified: Secondary | ICD-10-CM

## 2023-05-14 DIAGNOSIS — K219 Gastro-esophageal reflux disease without esophagitis: Secondary | ICD-10-CM

## 2023-05-15 NOTE — Telephone Encounter (Signed)
Requested Prescriptions  Pending Prescriptions Disp Refills   SYMBICORT 160-4.5 MCG/ACT inhaler [Pharmacy Med Name: SYMBICORT 160-4.5 AEROSOL] 10.2 g 0    Sig: INHALE TWO PUFFS BY MOUTH TWICE DAILY (MAINTENANCE INHALER). RINSE WITH WELL AFTER USE TO PREVENT THRUSH     Pulmonology:  Combination Products Passed - 05/15/2023  1:54 PM      Passed - Valid encounter within last 12 months    Recent Outpatient Visits           2 months ago Encounter for general adult medical examination with abnormal findings   [place] [facility] [place], Salvadore Oxford, NP   10 months ago Acquired hypothyroidism   [place] [facility] [place], Salvadore Oxford, NP   1 year ago Acute respiratory failure with hypoxia [facility])   [place] [facility] [place], Salvadore Oxford, NP   1 year ago Need for influenza vaccination   [place] [facility] [place], Salvadore Oxford, NP   1 year ago OAB (overactive bladder)   [place] [facility] Smitty Cords, DO       Future Appointments             In 3 months Baity, Salvadore Oxford, NP [place] [facility]             metoprolol succinate (TOPROL-XL) 50 MG 24 hr tablet [Pharmacy Med Name: METOPROLOL SUCCINATE ER 50MG  ER TABLET ER 24HR] 90 tablet 2    Sig: TAKE 1 TABLET BY MOUTH EVERY NIGHT AT   BEDTIME     Cardiovascular:  Beta Blockers Failed - 05/15/2023  1:54 PM      Failed - Last BP in normal range    BP Readings from Last 1 Encounters:  02/20/23 (!) 154/96         Passed - Last Heart Rate in normal range    Pulse Readings from Last 1 Encounters:  07/03/22 85         Passed - Valid encounter within last 6 months    Recent Outpatient Visits           2 months ago Encounter for general adult medical examination with abnormal findings   [place] [facility] [place], Salvadore Oxford, NP   10 months ago Acquired hypothyroidism   [place] [facility] [place], Salvadore Oxford, NP   1 year ago Acute respiratory failure with hypoxia [facility])   [place] [facility] [place], Salvadore Oxford, NP   1 year ago Need for influenza vaccination   [place] [facility] [place], Salvadore Oxford, NP   1 year ago OAB (overactive bladder)   [place] [facility] Smitty Cords, DO       Future Appointments             In 3 months Baity, Salvadore Oxford, NP  [facility]             acyclovir (ZOVIRAX) 400 MG tablet [Pharmacy Med Name: ACYCLOVIR 400MG  TABLET] 90 tablet 2    Sig: TAKE 1 TABLET BY MOUTH ONCE A DAY     Antimicrobials:  Antiviral Agents - Anti-Herpetic Passed - 05/15/2023  1:54 PM      Passed - Valid encounter within last 12 months    Recent Outpatient Visits  2 months ago Encounter for general adult medical examination with abnormal findings   [place] [facility] [place], Salvadore Oxford, NP   10 months ago Acquired hypothyroidism   [place] [facility] [place], Salvadore Oxford, NP   1 year ago Acute respiratory failure with hypoxia [facility])   [place] [facility] [place], Salvadore Oxford, NP   1 year ago Need for influenza vaccination   [facility] [place], Salvadore Oxford, NP   1 year ago OAB (overactive bladder)   [place] [facility] [place] [place], Netta Neat, DO       Future Appointments             In 3 months Baity, Salvadore Oxford, NP [place] [facility]             omega-3 acid ethyl esters (LOVAZA) 1 g capsule [Pharmacy Med Name: OMEGA-3-ACID ETHYL ESTERS 1GM CAPSULE] 360 capsule 2    Sig: TAKE 2 CAPSULES BY MOUTH TWICE A DAY     Endocrinology:  Nutritional Agents - omega-3 acid ethyl esters Failed - 05/15/2023  1:54 PM      Failed - Lipid Panel in normal range within the last 12 months    Cholesterol  Date Value Ref Range Status   02/20/2023 147 <200 mg/dL Final   LDL Cholesterol (Calc)  Date Value Ref Range Status  02/20/2023 74 mg/dL (calc) Final    Comment:    Reference range: <100 . Desirable range <100 mg/dL for primary prevention;   <70 mg/dL for patients with CHD or diabetic patients  with > or = 2 CHD risk factors. Marland Kitchen LDL-C is now calculated using the Martin-Hopkins  calculation, which is a validated novel method providing  better accuracy than the Friedewald equation in the  estimation of LDL-C.  Horald Pollen et al. Lenox Ahr. 5284;132(44): 2061-2068  (http://education.[facility].com/faq/FAQ164)    HDL  Date Value Ref Range Status  02/20/2023 53 > OR = 50 mg/dL Final   Triglycerides  Date Value Ref Range Status  02/20/2023 122 <150 mg/dL Final         Passed - Valid encounter within last 12 months    Recent Outpatient Visits           2 months ago Encounter for general adult medical examination with abnormal findings   [place] [facility] [place], Salvadore Oxford, NP   10 months ago Acquired hypothyroidism   [place] [facility] [place], Salvadore Oxford, NP   1 year ago Acute respiratory failure with hypoxia [facility])   [place] [facility] [place], Salvadore Oxford, NP   1 year ago Need for influenza vaccination   [place] [facility] [place], Salvadore Oxford, NP   1 year ago OAB (overactive bladder)   [place] [facility] Smitty Cords, DO       Future Appointments             In 3 months Baity, Salvadore Oxford, NP [facility]             rosuvastatin (CRESTOR) 40 MG tablet [Pharmacy Med Name: ROSUVASTATIN CALCIUM 40MG  TABLET] 90 tablet 2    Sig: TAKE 1 TABLET BY MOUTH ONCE A DAY     Cardiovascular:  Antilipid - Statins 2 Failed - 05/15/2023  1:54 PM      Failed -  Lipid Panel in normal range within the last 12 months    Cholesterol  Date Value Ref Range Status  02/20/2023 147  <200 mg/dL Final   LDL Cholesterol (Calc)  Date Value Ref Range Status  02/20/2023 74 mg/dL (calc) Final    Comment:    Reference range: <100 . Desirable range <100 mg/dL for primary prevention;   <70 mg/dL for patients with CHD or diabetic patients  with > or = 2 CHD risk factors. Marland Kitchen LDL-C is now calculated using the Martin-Hopkins  calculation, which is a validated novel method providing  better accuracy than the Friedewald equation in the  estimation of LDL-C.  Horald Pollen et al. Lenox Ahr. 4098;119(14): 2061-2068  (http://education.[facility].com/faq/FAQ164)    HDL  Date Value Ref Range Status  02/20/2023 53 > OR = 50 mg/dL Final   Triglycerides  Date Value Ref Range Status  02/20/2023 122 <150 mg/dL Final         Passed - Cr in normal range and within 360 days    Creat  Date Value Ref Range Status  02/20/2023 0.69 0.50 - 1.05 mg/dL Final         Passed - Patient is not pregnant      Passed - Valid encounter within last 12 months    Recent Outpatient Visits           2 months ago Encounter for general adult medical examination with abnormal findings   [place] [facility] [place], Salvadore Oxford, NP   10 months ago Acquired hypothyroidism   [place] [facility] [place], Salvadore Oxford, NP   1 year ago Acute respiratory failure with hypoxia [facility])   [place] [facility] [place], Salvadore Oxford, NP   1 year ago Need for influenza vaccination   [place] [facility] [place], Salvadore Oxford, NP   1 year ago OAB (overactive bladder)   [place] [facility] Smit[facility] Cords, DO       Future Appointments             In 3 months Baity, Salvadore Oxford, NP [place] [facility]             esomeprazole (NEXIUM) 40 MG capsule [Pharmacy Med Name: ESOMEPRAZOLE MAGNESIUM 40MG  DR CAPSULE DR] 90 capsule 0    Sig: TAKE ONE CAPSULE (40 MG TOTAL) BY MOUTH DAILY.      Gastroenterology: Proton Pump Inhibitors 2 Passed - 05/15/2023  1:54 PM      Passed - ALT in normal range and within 360 days    ALT  Date Value Ref Range Status  02/20/2023 13 6 - 29 U/L Final   SGPT (ALT)  Date Value Ref Range Status  03/30/2014 20 U/L Final    Comment:    14-63 NOTE: New Reference Range 11/01/13          Passed - AST in normal range and within 360 days    AST  Date Value Ref Range Status  02/20/2023 16 10 - 35 U/L Final   SGOT(AST)  Date Value Ref Range Status  03/30/2014 18 15 - 37 Unit/L Final         Passed - Valid encounter within last 12 months    Recent Outpatient Visits           2 months ago Encounter for general adult medical examination with abnormal findings   [place] [facility] [place], Salvadore Oxford, NP   10 months ago Acquired hypothyroidism   [place] [facility] [place], Salvadore Oxford, NP   1 year ago Acute respiratory failure with hypoxia [facility])   [place] [facility] [place], Salvadore Oxford, NP   1 year ago Need for influenza vaccination   [place] [facility] [place], Salvadore Oxford, NP   1 year ago OAB (overactive bladder)   [place] [facility] Smitty Cords, DO       Future Appointments             In 3 months Baity, Salvadore Oxford, NP [place] [facility]             SYNTHROID 88 MCG tablet [Pharmacy Med Name: SYNTHROID TABLET] 90 tablet 0    Sig: TAKE 1 TABLET BY MOUTH ONCE A DAY BEFORE BREAKFAST     Endocrinology:  Hypothyroid Agents Passed - 05/15/2023  1:54 PM      Passed - TSH in normal range and within 360 days    TSH  Date Value Ref Range Status  02/20/2023 4.13 0.40 - 4.50 mIU/L Final         Passed - Valid encounter within last 12 months    Recent Outpatient Visits           2 months ago Encounter for general adult medical examination with abnormal findings   [place] [facility]  [place], Salvadore Oxford, NP   10 months ago Acquired hypothyroidism   [place] [facility] [place], Salvadore Oxford, NP   1 year ago Acute respiratory failure with hypoxia [facility])   [facility] [place], Salvadore Oxford, NP   1 year ago Need for influenza vaccination   [place] [facility] [place], Salvadore Oxford, NP   1 year ago OAB (overactive bladder)   [place] [facility] Smitty Cords, DO       Future Appointments             In 3 months Baity, Salvadore Oxford, NP  [facility]

## 2023-06-18 DIAGNOSIS — Z79899 Other long term (current) drug therapy: Secondary | ICD-10-CM | POA: Diagnosis not present

## 2023-06-18 DIAGNOSIS — M5416 Radiculopathy, lumbar region: Secondary | ICD-10-CM | POA: Diagnosis not present

## 2023-06-18 DIAGNOSIS — G894 Chronic pain syndrome: Secondary | ICD-10-CM | POA: Diagnosis not present

## 2023-06-18 DIAGNOSIS — K5903 Drug induced constipation: Secondary | ICD-10-CM | POA: Diagnosis not present

## 2023-06-18 DIAGNOSIS — F17210 Nicotine dependence, cigarettes, uncomplicated: Secondary | ICD-10-CM | POA: Diagnosis not present

## 2023-06-18 DIAGNOSIS — M542 Cervicalgia: Secondary | ICD-10-CM | POA: Diagnosis not present

## 2023-06-18 DIAGNOSIS — Z79891 Long term (current) use of opiate analgesic: Secondary | ICD-10-CM | POA: Diagnosis not present

## 2023-06-19 ENCOUNTER — Other Ambulatory Visit: Payer: Self-pay | Admitting: Gastroenterology

## 2023-06-19 DIAGNOSIS — M5416 Radiculopathy, lumbar region: Secondary | ICD-10-CM

## 2023-06-26 ENCOUNTER — Ambulatory Visit

## 2023-06-29 ENCOUNTER — Other Ambulatory Visit: Payer: Self-pay | Admitting: Internal Medicine

## 2023-07-31 ENCOUNTER — Other Ambulatory Visit: Payer: Self-pay | Admitting: Internal Medicine

## 2023-07-31 DIAGNOSIS — K219 Gastro-esophageal reflux disease without esophagitis: Secondary | ICD-10-CM

## 2023-07-31 NOTE — Telephone Encounter (Signed)
 Requested Prescriptions  Pending Prescriptions Disp Refills   esomeprazole  (NEXIUM ) 40 MG capsule [Pharmacy Med Name: ESOMEPRAZOLE  MAGNESIUM  40MG  DR CAPSULE DR] 90 capsule 0    Sig: TAKE ONE CAPSULE (40 MG TOTAL) BY MOUTH DAILY.     Gastroenterology: Proton Pump Inhibitors 2 Failed - 07/31/2023  3:33 PM      Failed - Valid encounter within last 12 months    Recent Outpatient Visits   None     Future Appointments             In 3 weeks Baity, Rankin Buzzard, NP [place] [facility]            Passed - ALT in normal range and within 360 days    ALT  Date Value Ref Range Status  02/20/2023 13 6 - 29 U/L Final   SGPT (ALT)  Date Value Ref Range Status  03/30/2014 20 U/L Final    Comment:    14-63 NOTE: New Reference Range 11/01/13          Passed - AST in normal range and within 360 days    AST  Date Value Ref Range Status  02/20/2023 16 10 - 35 U/L Final   SGOT(AST)  Date Value Ref Range Status  03/30/2014 18 15 - 37 Unit/L Final

## 2023-08-20 ENCOUNTER — Emergency Department

## 2023-08-20 ENCOUNTER — Inpatient Hospital Stay
Admission: EM | Admit: 2023-08-20 | Discharge: 2023-08-25 | DRG: 092 | Disposition: A | Discharge status: Skilled Nursing Facility | Attending: Internal Medicine | Admitting: Internal Medicine

## 2023-08-20 ENCOUNTER — Encounter: Payer: Self-pay | Admitting: Emergency Medicine

## 2023-08-20 DIAGNOSIS — J4489 Other specified chronic obstructive pulmonary disease: Secondary | ICD-10-CM | POA: Diagnosis present

## 2023-08-20 DIAGNOSIS — Z1152 Encounter for screening for COVID-19: Secondary | ICD-10-CM | POA: Diagnosis not present

## 2023-08-20 DIAGNOSIS — G928 Other toxic encephalopathy: Principal | ICD-10-CM | POA: Diagnosis present

## 2023-08-20 DIAGNOSIS — I6782 Cerebral ischemia: Secondary | ICD-10-CM | POA: Diagnosis not present

## 2023-08-20 DIAGNOSIS — K219 Gastro-esophageal reflux disease without esophagitis: Secondary | ICD-10-CM | POA: Diagnosis present

## 2023-08-20 DIAGNOSIS — Z9889 Other specified postprocedural states: Secondary | ICD-10-CM

## 2023-08-20 DIAGNOSIS — Z6839 Body mass index (BMI) 39.0-39.9, adult: Secondary | ICD-10-CM | POA: Diagnosis not present

## 2023-08-20 DIAGNOSIS — S199XXA Unspecified injury of neck, initial encounter: Secondary | ICD-10-CM | POA: Diagnosis not present

## 2023-08-20 DIAGNOSIS — Z8619 Personal history of other infectious and parasitic diseases: Secondary | ICD-10-CM

## 2023-08-20 DIAGNOSIS — K649 Unspecified hemorrhoids: Secondary | ICD-10-CM | POA: Diagnosis not present

## 2023-08-20 DIAGNOSIS — E785 Hyperlipidemia, unspecified: Secondary | ICD-10-CM | POA: Diagnosis present

## 2023-08-20 DIAGNOSIS — Z604 Social exclusion and rejection: Secondary | ICD-10-CM | POA: Diagnosis present

## 2023-08-20 DIAGNOSIS — M160 Bilateral primary osteoarthritis of hip: Secondary | ICD-10-CM | POA: Diagnosis present

## 2023-08-20 DIAGNOSIS — K5909 Other constipation: Secondary | ICD-10-CM | POA: Diagnosis not present

## 2023-08-20 DIAGNOSIS — M5116 Intervertebral disc disorders with radiculopathy, lumbar region: Secondary | ICD-10-CM | POA: Diagnosis present

## 2023-08-20 DIAGNOSIS — J301 Allergic rhinitis due to pollen: Secondary | ICD-10-CM | POA: Diagnosis not present

## 2023-08-20 DIAGNOSIS — E876 Hypokalemia: Secondary | ICD-10-CM | POA: Diagnosis present

## 2023-08-20 DIAGNOSIS — M545 Low back pain, unspecified: Secondary | ICD-10-CM | POA: Diagnosis not present

## 2023-08-20 DIAGNOSIS — R9089 Other abnormal findings on diagnostic imaging of central nervous system: Secondary | ICD-10-CM | POA: Diagnosis not present

## 2023-08-20 DIAGNOSIS — M25552 Pain in left hip: Secondary | ICD-10-CM | POA: Diagnosis not present

## 2023-08-20 DIAGNOSIS — S3993XA Unspecified injury of pelvis, initial encounter: Secondary | ICD-10-CM | POA: Diagnosis not present

## 2023-08-20 DIAGNOSIS — I7300 Raynaud's syndrome without gangrene: Secondary | ICD-10-CM | POA: Diagnosis present

## 2023-08-20 DIAGNOSIS — Z7982 Long term (current) use of aspirin: Secondary | ICD-10-CM

## 2023-08-20 DIAGNOSIS — F17200 Nicotine dependence, unspecified, uncomplicated: Secondary | ICD-10-CM

## 2023-08-20 DIAGNOSIS — Z833 Family history of diabetes mellitus: Secondary | ICD-10-CM

## 2023-08-20 DIAGNOSIS — F419 Anxiety disorder, unspecified: Secondary | ICD-10-CM | POA: Diagnosis present

## 2023-08-20 DIAGNOSIS — Z79899 Other long term (current) drug therapy: Secondary | ICD-10-CM

## 2023-08-20 DIAGNOSIS — E039 Hypothyroidism, unspecified: Secondary | ICD-10-CM | POA: Diagnosis present

## 2023-08-20 DIAGNOSIS — G929 Unspecified toxic encephalopathy: Secondary | ICD-10-CM | POA: Diagnosis present

## 2023-08-20 DIAGNOSIS — R404 Transient alteration of awareness: Secondary | ICD-10-CM | POA: Diagnosis not present

## 2023-08-20 DIAGNOSIS — Z79890 Hormone replacement therapy (postmenopausal): Secondary | ICD-10-CM

## 2023-08-20 DIAGNOSIS — I10 Essential (primary) hypertension: Secondary | ICD-10-CM | POA: Diagnosis present

## 2023-08-20 DIAGNOSIS — Z8701 Personal history of pneumonia (recurrent): Secondary | ICD-10-CM

## 2023-08-20 DIAGNOSIS — F319 Bipolar disorder, unspecified: Secondary | ICD-10-CM | POA: Diagnosis present

## 2023-08-20 DIAGNOSIS — Z743 Need for continuous supervision: Secondary | ICD-10-CM | POA: Diagnosis not present

## 2023-08-20 DIAGNOSIS — R0902 Hypoxemia: Principal | ICD-10-CM | POA: Diagnosis present

## 2023-08-20 DIAGNOSIS — Z9089 Acquired absence of other organs: Secondary | ICD-10-CM

## 2023-08-20 DIAGNOSIS — G894 Chronic pain syndrome: Secondary | ICD-10-CM | POA: Diagnosis present

## 2023-08-20 DIAGNOSIS — Z9851 Tubal ligation status: Secondary | ICD-10-CM

## 2023-08-20 DIAGNOSIS — Q7033 Webbed toes, bilateral: Secondary | ICD-10-CM

## 2023-08-20 DIAGNOSIS — K5900 Constipation, unspecified: Secondary | ICD-10-CM | POA: Diagnosis present

## 2023-08-20 DIAGNOSIS — R531 Weakness: Secondary | ICD-10-CM

## 2023-08-20 DIAGNOSIS — R4182 Altered mental status, unspecified: Secondary | ICD-10-CM

## 2023-08-20 DIAGNOSIS — F17210 Nicotine dependence, cigarettes, uncomplicated: Secondary | ICD-10-CM | POA: Diagnosis present

## 2023-08-20 DIAGNOSIS — R296 Repeated falls: Secondary | ICD-10-CM | POA: Diagnosis present

## 2023-08-20 DIAGNOSIS — Z8249 Family history of ischemic heart disease and other diseases of the circulatory system: Secondary | ICD-10-CM

## 2023-08-20 DIAGNOSIS — R0602 Shortness of breath: Secondary | ICD-10-CM | POA: Diagnosis not present

## 2023-08-20 DIAGNOSIS — R5383 Other fatigue: Secondary | ICD-10-CM | POA: Diagnosis not present

## 2023-08-20 DIAGNOSIS — F05 Delirium due to known physiological condition: Secondary | ICD-10-CM | POA: Diagnosis present

## 2023-08-20 DIAGNOSIS — M8580 Other specified disorders of bone density and structure, unspecified site: Secondary | ICD-10-CM | POA: Diagnosis not present

## 2023-08-20 DIAGNOSIS — Z9181 History of falling: Secondary | ICD-10-CM

## 2023-08-20 DIAGNOSIS — R410 Disorientation, unspecified: Secondary | ICD-10-CM | POA: Diagnosis not present

## 2023-08-20 DIAGNOSIS — J449 Chronic obstructive pulmonary disease, unspecified: Secondary | ICD-10-CM | POA: Diagnosis not present

## 2023-08-20 DIAGNOSIS — M79675 Pain in left toe(s): Secondary | ICD-10-CM | POA: Diagnosis not present

## 2023-08-20 DIAGNOSIS — Z90710 Acquired absence of both cervix and uterus: Secondary | ICD-10-CM

## 2023-08-20 DIAGNOSIS — Z8679 Personal history of other diseases of the circulatory system: Secondary | ICD-10-CM

## 2023-08-20 DIAGNOSIS — Z79891 Long term (current) use of opiate analgesic: Secondary | ICD-10-CM

## 2023-08-20 DIAGNOSIS — W19XXXA Unspecified fall, initial encounter: Secondary | ICD-10-CM | POA: Diagnosis present

## 2023-08-20 DIAGNOSIS — M797 Fibromyalgia: Secondary | ICD-10-CM | POA: Diagnosis present

## 2023-08-20 DIAGNOSIS — G8929 Other chronic pain: Secondary | ICD-10-CM | POA: Diagnosis not present

## 2023-08-20 DIAGNOSIS — A419 Sepsis, unspecified organism: Secondary | ICD-10-CM | POA: Diagnosis not present

## 2023-08-20 DIAGNOSIS — Z736 Limitation of activities due to disability: Secondary | ICD-10-CM | POA: Diagnosis not present

## 2023-08-20 DIAGNOSIS — G9340 Encephalopathy, unspecified: Secondary | ICD-10-CM | POA: Diagnosis not present

## 2023-08-20 DIAGNOSIS — R110 Nausea: Secondary | ICD-10-CM | POA: Diagnosis not present

## 2023-08-20 DIAGNOSIS — Y92029 Unspecified place in mobile home as the place of occurrence of the external cause: Secondary | ICD-10-CM

## 2023-08-20 DIAGNOSIS — I499 Cardiac arrhythmia, unspecified: Secondary | ICD-10-CM | POA: Diagnosis not present

## 2023-08-20 DIAGNOSIS — Z888 Allergy status to other drugs, medicaments and biological substances status: Secondary | ICD-10-CM

## 2023-08-20 DIAGNOSIS — Z299 Encounter for prophylactic measures, unspecified: Secondary | ICD-10-CM | POA: Diagnosis not present

## 2023-08-20 DIAGNOSIS — Z8673 Personal history of transient ischemic attack (TIA), and cerebral infarction without residual deficits: Secondary | ICD-10-CM

## 2023-08-20 DIAGNOSIS — M79674 Pain in right toe(s): Secondary | ICD-10-CM | POA: Diagnosis not present

## 2023-08-20 DIAGNOSIS — Z7951 Long term (current) use of inhaled steroids: Secondary | ICD-10-CM

## 2023-08-20 DIAGNOSIS — M6259 Muscle wasting and atrophy, not elsewhere classified, multiple sites: Secondary | ICD-10-CM | POA: Diagnosis not present

## 2023-08-20 DIAGNOSIS — Z981 Arthrodesis status: Secondary | ICD-10-CM

## 2023-08-20 DIAGNOSIS — Z9049 Acquired absence of other specified parts of digestive tract: Secondary | ICD-10-CM

## 2023-08-20 DIAGNOSIS — E66812 Obesity, class 2: Secondary | ICD-10-CM | POA: Diagnosis present

## 2023-08-20 DIAGNOSIS — Z79624 Long term (current) use of inhibitors of nucleotide synthesis: Secondary | ICD-10-CM

## 2023-08-20 DIAGNOSIS — Z9884 Bariatric surgery status: Secondary | ICD-10-CM

## 2023-08-20 DIAGNOSIS — E872 Acidosis, unspecified: Secondary | ICD-10-CM | POA: Diagnosis present

## 2023-08-20 DIAGNOSIS — T50915A Adverse effect of multiple unspecified drugs, medicaments and biological substances, initial encounter: Secondary | ICD-10-CM | POA: Diagnosis present

## 2023-08-20 DIAGNOSIS — G9349 Other encephalopathy: Secondary | ICD-10-CM | POA: Diagnosis not present

## 2023-08-20 DIAGNOSIS — J302 Other seasonal allergic rhinitis: Secondary | ICD-10-CM | POA: Diagnosis not present

## 2023-08-20 DIAGNOSIS — R2681 Unsteadiness on feet: Secondary | ICD-10-CM | POA: Diagnosis not present

## 2023-08-20 DIAGNOSIS — S0990XA Unspecified injury of head, initial encounter: Secondary | ICD-10-CM | POA: Diagnosis not present

## 2023-08-20 LAB — COMPREHENSIVE METABOLIC PANEL WITH GFR
ALT: 19 U/L (ref 0–44)
AST: 32 U/L (ref 15–41)
Albumin: 3.3 g/dL — ABNORMAL LOW (ref 3.5–5.0)
Alkaline Phosphatase: 73 U/L (ref 38–126)
Anion gap: 10 (ref 5–15)
BUN: 13 mg/dL (ref 8–23)
CO2: 27 mmol/L (ref 22–32)
Calcium: 9.1 mg/dL (ref 8.9–10.3)
Chloride: 100 mmol/L (ref 98–111)
Creatinine, Ser: 0.8 mg/dL (ref 0.44–1.00)
GFR, Estimated: >60 mL/min (ref >60)
Glucose, Bld: 106 mg/dL — ABNORMAL HIGH (ref 70–99)
Potassium: 4.3 mmol/L (ref 3.5–5.1)
Sodium: 137 mmol/L (ref 135–145)
Total Bilirubin: 1.3 mg/dL — ABNORMAL HIGH (ref 0.0–1.2)
Total Protein: 6.6 g/dL (ref 6.5–8.1)

## 2023-08-20 LAB — CBC WITH DIFFERENTIAL/PLATELET
Abs Immature Granulocytes: 0.05 10*3/uL (ref 0.00–0.07)
Basophils Absolute: 0 10*3/uL (ref 0.0–0.1)
Basophils Relative: 0 %
Eosinophils Absolute: 0.1 10*3/uL (ref 0.0–0.5)
Eosinophils Relative: 1 %
HCT: 50.9 % — ABNORMAL HIGH (ref 36.0–46.0)
Hemoglobin: 16.5 g/dL — ABNORMAL HIGH (ref 12.0–15.0)
Immature Granulocytes: 1 %
Lymphocytes Relative: 22 %
Lymphs Abs: 2.4 10*3/uL (ref 0.7–4.0)
MCH: 30.9 pg (ref 26.0–34.0)
MCHC: 32.4 g/dL (ref 30.0–36.0)
MCV: 95.3 fL (ref 80.0–100.0)
Monocytes Absolute: 1.1 10*3/uL — ABNORMAL HIGH (ref 0.1–1.0)
Monocytes Relative: 11 %
Neutro Abs: 7.2 10*3/uL (ref 1.7–7.7)
Neutrophils Relative %: 65 %
Platelets: 167 10*3/uL (ref 150–400)
RBC: 5.34 MIL/uL — ABNORMAL HIGH (ref 3.87–5.11)
RDW: 15.9 % — ABNORMAL HIGH (ref 11.5–15.5)
WBC: 10.9 10*3/uL — ABNORMAL HIGH (ref 4.0–10.5)
nRBC: 0 % (ref 0.0–0.2)

## 2023-08-20 LAB — BLOOD GAS, VENOUS
Acid-Base Excess: 2.2 mmol/L — ABNORMAL HIGH (ref 0.0–2.0)
Bicarbonate: 27.3 mmol/L (ref 20.0–28.0)
O2 Saturation: 85.9 %
Patient temperature: 37
pCO2, Ven: 43 mmHg — ABNORMAL LOW (ref 44–60)
pH, Ven: 7.41 (ref 7.25–7.43)
pO2, Ven: 50 mmHg — ABNORMAL HIGH (ref 32–45)

## 2023-08-20 LAB — TROPONIN I (HIGH SENSITIVITY)
Troponin I (High Sensitivity): 7 ng/L (ref <18)
Troponin I (High Sensitivity): 5 ng/L (ref <18)

## 2023-08-20 LAB — URINALYSIS, W/ REFLEX TO CULTURE (INFECTION SUSPECTED)
Bilirubin Urine: NEGATIVE
Glucose, UA: NEGATIVE mg/dL
Hgb urine dipstick: NEGATIVE
Ketones, ur: 5 mg/dL — AB
Leukocytes,Ua: NEGATIVE
Nitrite: NEGATIVE
Protein, ur: NEGATIVE mg/dL
Specific Gravity, Urine: 1.015 (ref 1.005–1.030)
pH: 5 (ref 5.0–8.0)

## 2023-08-20 LAB — PROTIME-INR
INR: 1.2 (ref 0.8–1.2)
Prothrombin Time: 15 s (ref 11.4–15.2)

## 2023-08-20 LAB — LACTIC ACID, PLASMA
Lactic Acid, Venous: 2.2 mmol/L (ref 0.5–1.9)
Lactic Acid, Venous: 1.1 mmol/L (ref 0.5–1.9)

## 2023-08-20 LAB — RESP PANEL BY RT-PCR (RSV, FLU A&B, COVID)  RVPGX2
Influenza A by PCR: NEGATIVE
Influenza B by PCR: NEGATIVE
Resp Syncytial Virus by PCR: NEGATIVE
SARS Coronavirus 2 by RT PCR: NEGATIVE

## 2023-08-20 LAB — AMMONIA: Ammonia: 19 umol/L (ref 9–35)

## 2023-08-20 LAB — TSH: TSH: 3.02 u[IU]/mL (ref 0.350–4.500)

## 2023-08-20 MED ORDER — SODIUM CHLORIDE 0.9 % IV SOLN: INTRAVENOUS | Status: DC

## 2023-08-20 MED ORDER — ONDANSETRON HCL 4 MG PO TABS: 4.0000 mg | ORAL_TABLET | Freq: Four times a day (QID) | ORAL | Status: DC | PRN

## 2023-08-20 MED ORDER — LACTATED RINGERS IV BOLUS
1000.0000 mL | Freq: Once | INTRAVENOUS | Status: AC
  Administered 2023-08-20: 1000 mL via INTRAVENOUS

## 2023-08-20 MED ORDER — ACETAMINOPHEN 325 MG PO TABS
650.0000 mg | ORAL_TABLET | Freq: Four times a day (QID) | ORAL | Status: DC | PRN
  Administered 2023-08-20 – 2023-08-23 (×4): 650 mg via ORAL
  Filled 2023-08-20 (×4): qty 2

## 2023-08-20 MED ORDER — LIDOCAINE 5 % EX PTCH
1.0000 | MEDICATED_PATCH | Freq: Once | CUTANEOUS | Status: AC
Start: 2023-08-20 — End: 2023-08-21
  Administered 2023-08-20: 1 via TRANSDERMAL
  Filled 2023-08-20: qty 1

## 2023-08-20 MED ORDER — ONDANSETRON HCL 4 MG/2ML IJ SOLN
4.0000 mg | Freq: Four times a day (QID) | INTRAMUSCULAR | Status: DC | PRN
Start: 2023-08-20 — End: 2023-08-25

## 2023-08-20 MED ORDER — ENOXAPARIN SODIUM 60 MG/0.6ML IJ SOSY
0.5000 mg/kg | PREFILLED_SYRINGE | INTRAMUSCULAR | Status: DC
  Administered 2023-08-20 – 2023-08-24 (×5): 55 mg via SUBCUTANEOUS
  Filled 2023-08-20 (×6): qty 0.6

## 2023-08-20 MED ORDER — SODIUM CHLORIDE 0.9 % IV BOLUS
1000.0000 mL | Freq: Once | INTRAVENOUS | Status: AC
Start: 2023-08-20 — End: 2023-08-20
  Administered 2023-08-20: 1000 mL via INTRAVENOUS

## 2023-08-20 NOTE — ED Provider Notes (Signed)
-----------------------------------------   11:14 AM on 08/20/2023 ----------------------------------------- Patient care assumed from Dr. Drenda Gentle.  Patient's workup shows slightly elevated lactic acid and she looks very dry on physical exam we will IV hydrate.  CBC shows no significant finding chemistry otherwise shows no significant finding urinalysis shows no sign of infection.  Patient is complaining of worsening hip pain she has arthritis seen on x-ray as well as CT imaging but no fracture.  I was speaking to the patient in the room she appears confused slow responses and she keeps talking about seeing something in the corner of the room.  Son states the patient has been hallucinating over the last few days.  Per son she keeps talking about people living in her camper to keep coming in and out of her camper including a small child.  Son states no one is in her camper.  He states no history of this previously.  Although no significant finding on her lab work her symptoms are concerning for acute onset delirium.  Patient is on several medications that could be contributing to her symptoms such as hydroxyzine , MS Contin , oxycodone  and Lyrica .  However given the patient's continued visual hallucinations in the room I believe she needs admission to the hospital service for further workup and treatment including limiting some of her medications that could be contributing.   [name], MD 08/20/23 1116

## 2023-08-20 NOTE — H&P (Signed)
 History and Physical    Patient: April Soto:[id number] DOB: [birth year] DOA: 08/20/2023 DOS: the patient was seen and examined on 08/20/2023 PCP: Carollynn Cirri, NP  Patient coming from: Home  Chief Complaint:  Chief Complaint  Patient presents with   Code Sepsis   HPI: April Soto is a 63 y.o. female with medical history significant of obesity, chronic back pain, hypertension, hypothyroidism, anxiety presenting with encephalopathy.  History primarily from patient's son in setting of encephalopathy.  Per the son, patient with worsening confusion at home.  Patient currently lives in a separate RV from the house at home.  Has had worsening agitation about seeing people although they are not there.  Also with falls.  No reported fevers or chills.  No reported cough or shortness of breath.  No reported abdominal pain or diarrhea.  No reported vomiting.  Has had worsening confusion over multiple days.  Unclear if patient has underlying dementia that has not been formally treated.  No reported recent medication changes though he is unsure of the exact medication patient takes.  Multiple falls at home over the last 2 days.  Per report, patient has refused rehab in the past.  Was brought to ER by EMS noted to have strong smell of urine.  Positive abrasions. Presented to the ER afebrile, heart rate 100s, BP stable.  Satting well on room air.  White count 10.9, hemoglobin 16.5, platelets 167, lactate 2.2-1.1.  VBG stable.  Urinalysis not indicative of infection.  COVID flu and RSV negative.  Creatinine 0.8.  CT head, CT C-spine, CT pelvis grossly stable.  Chest x-ray within normal limits. Review of Systems: As mentioned in the history of present illness. All other systems reviewed and are negative. Past Medical History:  Diagnosis Date   Anemia    "years ago"   Anxiety    takes Ativan  daily   Asthma    Bipolar 1 disorder (HCC)    Chronic back pain    HNP   Constipation    takes Ra Col Rite  daily   Degenerative disc disease    Depression    was on Prozac  but taken off by MD for a couple of weeks   Depression    Fibromyalgia    takes Lyrica  daily   Genital herpes    GERD (gastroesophageal reflux disease)    takes Nexium  daily   Headache(784.0)    Heart murmur    "grew out of"   History of bronchitis    last time 15+yrs ago   History of rheumatic fever    Hyperlipidemia    takes Simvastatin  daily   Hypertension    takes Coreg  daily   Hypothyroidism    takes Synthroid  daily   Joint pain    Muscle spasm    takes Flexeril  daily as needed   Nocturia    Osteoarthritis    Peripheral edema    takes HCTZ daily   Pneumonia    hx of;last time 30+yrs ago   Raynaud's disease    Rheumatic fever    as a child   Seasonal allergies    takes Zyrtec  daily and uses Dymista as well   Stroke [facility])    ? 5 years ago. No lasting deficits   Urinary incontinence    Weakness    nmbness and tingling-right leg   Past Surgical History:  Procedure Laterality Date   ABDOMINAL HYSTERECTOMY     BACK SURGERY     CHOLECYSTECTOMY  COLONOSCOPY     ESOPHAGOGASTRODUODENOSCOPY     FOOT SURGERY Right 12/2013   ankle, heel and top of foot   LUMBAR LAMINECTOMY/DECOMPRESSION MICRODISCECTOMY Right 01/14/2013   Procedure: RIGHT LUMBAR FOUR FIVE  LAMINECTOMY/DECOMPRESSION MICRODISCECTOMY 1 LEVEL;  Surgeon: Augustine Blocker, MD;  Location: [facility];  Service: Neurosurgery;  Laterality: Right;  RIGHT L45 microdiskectomy   OTHER SURGICAL HISTORY     Gastric stapling for weight loss. patient reports done in 1980's   SEPTOPLASTY     TONSILLECTOMY     TUBAL LIGATION     Social History:  reports that she has been smoking cigarettes. She has a 40 pack-year smoking history. She has never used smokeless tobacco. She reports that she does not drink alcohol and does not use drugs.  Allergies  Allergen Reactions   Levothyroxine  Dermatitis    depression    Family History  Problem Relation Age  of Onset   Diabetes type II Mother    Hypertension Mother    Diabetes Mother    Diabetes Sister    Hypertension Sister    Hypertension Maternal Grandmother    Diabetes Maternal Grandmother    Breast cancer Neg Hx     Prior to Admission medications   Medication Sig Start Date End Date Taking? Authorizing Provider  acyclovir  (ZOVIRAX ) 400 MG tablet TAKE 1 TABLET BY MOUTH DAILY 01/23/23   Carollynn Cirri, NP  albuterol  (VENTOLIN  HFA) 108 (90 Base) MCG/ACT inhaler Inhale 2 puffs into the lungs every 4 (four) hours as needed for wheezing or shortness of breath.     [provider]  aspirin  EC 81 MG tablet Take 1 tablet (81 mg total) by mouth daily. Swallow whole. 07/03/22   Baity, Rankin Buzzard, NP  budesonide-formoterol  (SYMBICORT) 160-4.5 MCG/ACT inhaler INHALE TWO PUFFS BY MOUTH TWICE DAILY (MAINTENANCE INHALER). RINSE WITH WELL AFTER USE TO PREVENT THRUSH 05/15/23   Carollynn Cirri, NP  cetirizine  (ZYRTEC ) 10 MG tablet Take 1 tablet (10 mg total) by mouth daily. 12/17/21   Carollynn Cirri, NP  diclofenac  (VOLTAREN ) 75 MG EC tablet Take 1 tablet (75 mg total) by mouth 2 (two) times daily. 07/03/22   Carollynn Cirri, NP  esomeprazole  (NEXIUM ) 40 MG capsule TAKE ONE CAPSULE (40 MG TOTAL) BY MOUTH DAILY. 07/31/23   Carollynn Cirri, NP  fluticasone  (FLONASE ) 50 MCG/ACT nasal spray INSTILL ONE SPRAY INTO EACH NOSTRIL DAILY AS NEEDED FOR SEASONAL ALLERGY SYMPTOMS 07/01/23   Carollynn Cirri, NP  hydrocortisone  (ANUSOL -HC) 2.5 % rectal cream Apply 1 application topically as needed for hemorrhoids.     [provider]  hydrOXYzine  (ATARAX ) 25 MG tablet Take 25 mg by mouth 2 (two) times daily. 01/27/22   [provider]  metoprolol  succinate (TOPROL -XL) 50 MG 24 hr tablet TAKE 1 TABLET BY MOUTH EVERY NIGHT AT   BEDTIME 05/15/23   Carollynn Cirri, NP  mirabegron  ER (MYRBETRIQ ) 50 MG TB24 tablet TAKE 1 TABLET BY MOUTH DAILY 01/23/23   Carollynn Cirri, NP  morphine  (MS CONTIN ) 15 MG 12 hr  tablet Take 15 mg by mouth 3 (three) times daily. 02/14/20   [provider]  MOVANTIK 25 MG TABS tablet Take 25 mg by mouth daily. 07/19/20   [provider]  Olopatadine  HCl 0.2 % SOLN Place 1 drop into both eyes at bedtime as needed (allergies).    [provider]  omega-3 acid ethyl esters (LOVAZA ) 1 g capsule TAKE 2 CAPSULES BY MOUTH TWICE  A DAY 05/15/23   Carollynn Cirri, NP  ondansetron  (ZOFRAN -ODT) 4 MG disintegrating tablet Take 1 tablet (4 mg total) by mouth every 8 (eight) hours as needed for nausea or vomiting. 05/10/21   Jacquie Maudlin, MD  Oxycodone  HCl 10 MG TABS Take 10 mg by mouth 4 (four) times daily as needed. 06/01/21   [provider]  pregabalin  (LYRICA ) 100 MG capsule Take 100 mg by mouth 3 (three) times daily. 04/16/21   [provider]  rosuvastatin  (CRESTOR ) 40 MG tablet TAKE 1 TABLET BY MOUTH ONCE A DAY 04/15/23   Carollynn Cirri, NP  SYNTHROID  88 MCG tablet TAKE 1 TABLET BY MOUTH ONCE A DAY BEFORE BREAKFAST 05/15/23   Carollynn Cirri, NP  venlafaxine  XR (EFFEXOR -XR) 150 MG 24 hr capsule Take 1 capsule (150 mg total) by mouth every evening. For mood control 08/21/17   Money, Sherolyn Dixon B, FNP  ziprasidone  (GEODON ) 40 MG capsule TAKE 1 CAPSULE BY MOUTH EVERY EVENING WITH FOOD FOR MOOD 10/01/20   Carollynn Cirri, NP    Physical Exam: Vitals:   08/20/23 0930 08/20/23 1000 08/20/23 1214 08/20/23 1330  BP: (!) 142/116 120/83  119/88  Pulse: (!) 108 (!) 105  (!) 111  Resp: (!) 21 14  17   Temp:   98 F (36.7 C)   TempSrc:   Oral   SpO2: 91% 95%  92%  Weight:      Height:       Physical Exam Constitutional:      Appearance: She is obese.     Comments: + generalized lethargy  Able to answer some questions    HENT:     Head: Normocephalic and atraumatic.     Nose: Nose normal.  Cardiovascular:     Rate and Rhythm: Normal rate and regular rhythm.  Pulmonary:     Effort: Pulmonary effort is normal.  Abdominal:     General: Bowel  sounds are normal.  Musculoskeletal:     Comments: +generalized weakness    Skin:    General: Skin is warm.  Neurological:     General: No focal deficit present.  Psychiatric:        Mood and Affect: Mood normal.     Data Reviewed:  There are no new results to review at this time.  CT PELVIS WO CONTRAST CLINICAL DATA:  Hip trauma, fracture suspected  EXAM: CT PELVIS WITHOUT CONTRAST  TECHNIQUE: Multidetector CT imaging of the pelvis was performed following the standard protocol without intravenous contrast.  RADIATION DOSE REDUCTION: This exam was performed according to the departmental dose-optimization program which includes automated exposure control, adjustment of the mA and/or kV according to patient size and/or use of iterative reconstruction technique.  COMPARISON:  Pelvis radiography from earlier today  FINDINGS: No acute fracture or dislocation. Degenerative spurring with hip joint narrowing on the right more than left, severe on the right.  L4-5 solid arthrodesis. Lumbosacral disc degeneration. No evidence of pelvic ring fracture or diastasis.  No acute soft tissue finding.  IMPRESSION: No acute finding.  Right more than left hip osteoarthritis, particularly severe on the right.  Electronically Signed   By: Ronnette Coke M.D.   On: 08/20/2023 07:55 DG Hip Unilat With Pelvis 2-3 Views Left CLINICAL DATA:  Several falls in last 2 days.  Left hip pain.  EXAM: DG HIP (WITH OR WITHOUT PELVIS) 2-3V LEFT  COMPARISON:  None Available.  FINDINGS: There is no evidence of hip fracture or dislocation. Moderate  left hip osteoarthritis is seen. Severe right hip osteoarthritis is also demonstrated. Generalized osteopenia noted. Fusion hardware noted in lower lumbar spine.  IMPRESSION: No acute findings.  Moderate left and severe right hip osteoarthritis.  Electronically Signed   By: Marlyce Sine M.D.   On: 08/20/2023 06:11 CT Cervical Spine Wo  Contrast CLINICAL DATA:  Neck trauma, intoxication or obtain ended. Multiple falls over 2 days. Code sepsis  EXAM: CT HEAD WITHOUT CONTRAST  CT CERVICAL SPINE WITHOUT CONTRAST  TECHNIQUE: Multidetector CT imaging of the head and cervical spine was performed following the standard protocol without intravenous contrast. Multiplanar CT image reconstructions of the cervical spine were also generated.  RADIATION DOSE REDUCTION: This exam was performed according to the departmental dose-optimization program which includes automated exposure control, adjustment of the mA and/or kV according to patient size and/or use of iterative reconstruction technique.  COMPARISON:  None Available.  FINDINGS: CT HEAD FINDINGS  Brain: No evidence of acute infarction, hemorrhage, hydrocephalus, extra-axial collection or mass lesion/mass effect. Moderately extensive chronic small vessel ischemia in the cerebral white matter. Mild cerebral volume loss.  Vascular: No hyperdense vessel or unexpected calcification.  Skull: Normal. Negative for fracture or focal lesion.  Sinuses/Orbits: No acute finding  CT CERVICAL SPINE FINDINGS  Alignment: No traumatic malalignment  Skull base and vertebrae: No acute fracture. No primary bone lesion or focal pathologic process.  Soft tissues and spinal canal: No prevertebral fluid or swelling. No visible canal hematoma.  Disc levels:  Generalized degenerative endplate spurring.  Upper chest: No evidence of injury  IMPRESSION: No evidence of acute intracranial or cervical spine injury.  Electronically Signed   By: Ronnette Coke M.D.   On: 08/20/2023 06:10 CT Head Wo Contrast CLINICAL DATA:  Neck trauma, intoxication or obtain ended. Multiple falls over 2 days. Code sepsis  EXAM: CT HEAD WITHOUT CONTRAST  CT CERVICAL SPINE WITHOUT CONTRAST  TECHNIQUE: Multidetector CT imaging of the head and cervical spine was performed following the standard  protocol without intravenous contrast. Multiplanar CT image reconstructions of the cervical spine were also generated.  RADIATION DOSE REDUCTION: This exam was performed according to the departmental dose-optimization program which includes automated exposure control, adjustment of the mA and/or kV according to patient size and/or use of iterative reconstruction technique.  COMPARISON:  None Available.  FINDINGS: CT HEAD FINDINGS  Brain: No evidence of acute infarction, hemorrhage, hydrocephalus, extra-axial collection or mass lesion/mass effect. Moderately extensive chronic small vessel ischemia in the cerebral white matter. Mild cerebral volume loss.  Vascular: No hyperdense vessel or unexpected calcification.  Skull: Normal. Negative for fracture or focal lesion.  Sinuses/Orbits: No acute finding  CT CERVICAL SPINE FINDINGS  Alignment: No traumatic malalignment  Skull base and vertebrae: No acute fracture. No primary bone lesion or focal pathologic process.  Soft tissues and spinal canal: No prevertebral fluid or swelling. No visible canal hematoma.  Disc levels:  Generalized degenerative endplate spurring.  Upper chest: No evidence of injury  IMPRESSION: No evidence of acute intracranial or cervical spine injury.  Electronically Signed   By: Ronnette Coke M.D.   On: 08/20/2023 06:10 DG Chest Port 1 View CLINICAL DATA:  Shortness of breath.  Hypoxia.  Possible sepsis  EXAM: PORTABLE CHEST 1 VIEW  COMPARISON:  03/14/2022  FINDINGS: The heart size and mediastinal contours are within normal limits. Both lungs are clear. The visualized skeletal structures are unremarkable.  IMPRESSION: No active disease.  Electronically Signed   By: Deward Force.D.  On: 08/20/2023 06:09  Lab Results  Component Value Date   WBC 10.9 (H) 08/20/2023   HGB 16.5 (H) 08/20/2023   HCT 50.9 (H) 08/20/2023   MCV 95.3 08/20/2023   PLT 167 08/20/2023   Last metabolic  panel Lab Results  Component Value Date   GLUCOSE 106 (H) 08/20/2023   NA 137 08/20/2023   K 4.3 08/20/2023   CL 100 08/20/2023   CO2 27 08/20/2023   BUN 13 08/20/2023   CREATININE 0.80 08/20/2023   GFRNONAA >60 08/20/2023   CALCIUM  9.1 08/20/2023   PROT 6.6 08/20/2023   ALBUMIN 3.3 (L) 08/20/2023   BILITOT 1.3 (H) 08/20/2023   ALKPHOS 73 08/20/2023   AST 32 08/20/2023   ALT 19 08/20/2023   ANIONGAP 10 08/20/2023    Assessment and Plan: Encephalopathy Worsening generalized confusion, agitation with falls at home Suspect likely multifactorial with contributions of polypharmacy, dehydration No overt infection noted Ammonia level within normal limits CT head  stable IV fluid hydration Hold offending agents including narcotics, Atarax , Lyrica , Geodon  Monitor  COPD (chronic obstructive pulmonary disease) (HCC) Appears stable from a respiratory standpoint Continue home inhalers VBG pending in the setting of encephalopathy  Acquired hypothyroidism Check TSH    Essential hypertension BP stable  Titrate home regimen        Advance Care Planning:   Code Status: Prior   Consults: None   Family Communication: Son at the bedside   Severity of Illness: The appropriate patient status for this patient is OBSERVATION. Observation status is judged to be reasonable and necessary in order to provide the required intensity of service to ensure the patient's safety. The patient's presenting symptoms, physical exam findings, and initial radiographic and laboratory data in the context of their medical condition is felt to place them at decreased risk for further clinical deterioration. Furthermore, it is anticipated that the patient will be medically stable for discharge from the hospital within 2 midnights of admission.   Author: Corrinne Din, MD 08/20/2023 2:24 PM  For on call review [email or web address].

## 2023-08-20 NOTE — Progress Notes (Signed)
 PHARMACIST - PHYSICIAN COMMUNICATION  CONCERNING:  Enoxaparin (Lovenox) for DVT Prophylaxis    RECOMMENDATION: Patient was prescribed enoxaprin 40mg  q24 hours for VTE prophylaxis.   Filed Weights   08/20/23 0521  Weight: 110.2 kg (242 lb 15.2 oz)    Body mass index is 39.21 kg/m.  Estimated Creatinine Clearance: 89.4 mL/min (by C-G formula based on SCr of 0.8 mg/dL).   Based on [facility] policy patient is candidate for enoxaparin 0.5mg /kg TBW SQ every 24 hours based on BMI being >30.  DESCRIPTION: Pharmacy has adjusted enoxaparin dose per [facility] policy.  Patient is now receiving enoxaparin 55 mg every 24 hours    Ananias Balls, PharmD Clinical Pharmacist  08/20/2023 5:15 PM

## 2023-08-20 NOTE — Assessment & Plan Note (Signed)
 Appears stable from a respiratory standpoint Continue home inhalers VBG pending in the setting of encephalopathy

## 2023-08-20 NOTE — ED Notes (Signed)
 Tan MD made aware lactic 2.2

## 2023-08-20 NOTE — Assessment & Plan Note (Addendum)
 Worsening generalized confusion, agitation with falls at home Suspect likely multifactorial with contributions of polypharmacy, dehydration No overt infection noted Ammonia level within normal limits CT head  stable IV fluid hydration Hold offending agents including narcotics, Atarax , Lyrica , Geodon  Monitor

## 2023-08-20 NOTE — Assessment & Plan Note (Signed)
 BP stable Titrate home regimen

## 2023-08-20 NOTE — Assessment & Plan Note (Signed)
 Check TSH

## 2023-08-20 NOTE — ED Triage Notes (Signed)
 Pt arrived via [facility] from home as CODE SEPSIS. Pt has had multiple falls over last 2 days with fall occurring this AM. Pt lives in driveway of son's and he reported to EMS that pt needs rehab but refuses and continues to have increased weakness in bilateral legs. Over last 2 days, son reported hallucinations, AMS and reports of pt c/o dizziness at night. Strong odor of ammonia on arrival to ED. Pt with multiple abrasions, bruising and redness to upper and lower extremities. Poor circulation noted to bilateral feet with discoloration. Pt initial oxygen saturation 88%, placed on 2L nasal canula with increase to 96%.

## 2023-08-20 NOTE — ED Provider Notes (Signed)
 [facility] Provider Note    Event Date/Time   First MD Initiated Contact with Patient 08/20/23 0522     (approximate)   History   Code Sepsis   HPI  April Soto is a 63 y.o. female history of bipolar disorder, asthma, anxiety, fibromyalgia, GERD, presenting for fall.  Per EMS patient has been falling for the last 2 days, states that her legs will give out.  Son who reported that she was having hallucinations and altered mental status.  EMS noted that she was 88% when they got to her, placed on 2 L nasal cannula with increased 96%.  She denies any nausea, vomiting, diarrhea, urinary symptoms, cough, chest pain, shortness of breath.  States that she has chronic back pain with right-sided sciatica, no incontinence, no saddle anesthesia.  She denies any new numbness or focal weakness.  Does state that she has left hip pain.   Independent history obtained from EMS as above.  On independent chart review, she has been seen in January 2025 for chronic pain syndrome, has history of lumbar radiculopathy.  Physical Exam   Triage Vital Signs: ED Triage Vitals  Encounter Vitals Group     BP 08/20/23 0523 138/87     Systolic BP Percentile --      Diastolic BP Percentile --      Pulse Rate 08/20/23 0523 (!) 106     Resp 08/20/23 0523 12     Temp 08/20/23 0523 98.1 F (36.7 C)     Temp Source 08/20/23 0523 Oral     SpO2 08/20/23 0520 (!) 88 %     Weight 08/20/23 0521 242 lb 15.2 oz (110.2 kg)     Height 08/20/23 0521 5\' 6"  (1.676 m)     Head Circumference --      Peak Flow --      Pain Score 08/20/23 0521 8     Pain Loc --      Pain Education --      Exclude from Growth Chart --     Most recent vital signs: Vitals:   08/20/23 0520 08/20/23 0523  BP:  138/87  Pulse:  (!) 106  Resp:  12  Temp:  98.1 F (36.7 C)  SpO2: (!) 88% 100%     General: Awake, no distress.  CV:  Good peripheral perfusion.  Resp:  Normal effort.  Clear Abd:  No  distention.  Soft nontender Other:  No palpable skull deformities or tenderness, no facial deformities or tenderness, no thoracic cage tenderness, bilateral upper extremities without any bony tenderness, full range of motion is intact, able to range her right lower extremity, she does have tenderness to her left hip with reduced range of motion, no tenderness to the rest of her extremity, she is palpable DP pulses bilaterally.  Sensation is intact, no midline spinal tenderness, no saddle anesthesia, able to dorsi and plantarflex bilaterally without focal weakness.   ED Results / Procedures / Treatments   Labs (all labs ordered are listed, but only abnormal results are displayed) Labs Reviewed  LACTIC ACID, PLASMA - Abnormal; Notable for the following components:      Result Value   Lactic Acid, Venous 2.2 (*)    All other components within normal limits  CBC WITH DIFFERENTIAL/PLATELET - Abnormal; Notable for the following components:   WBC 10.9 (*)    RBC 5.34 (*)    Hemoglobin 16.5 (*)    HCT 50.9 (*)  RDW 15.9 (*)    Monocytes Absolute 1.1 (*)    All other components within normal limits  CULTURE, BLOOD (ROUTINE X 2)  CULTURE, BLOOD (ROUTINE X 2)  RESP PANEL BY RT-PCR (RSV, FLU A&B, COVID)  [id number]  PROTIME-INR  COMPREHENSIVE METABOLIC PANEL WITH GFR  LACTIC ACID, PLASMA  URINALYSIS, W/ REFLEX TO CULTURE (INFECTION SUSPECTED)  AMMONIA  TROPONIN I (HIGH SENSITIVITY)  TROPONIN I (HIGH SENSITIVITY)     EKG  EKG shows, EKG shows sinus tachycardia, rate 107, normal QS, normal QTc, T wave flattening in V3, no ischemic ST elevation, nonspecific change compared to prior   RADIOLOGY On my independent interpretation, CT head without obvious intracranial hemorrhage   PROCEDURES:  Critical Care performed: No  Procedures   MEDICATIONS ORDERED IN ED: Medications  lactated ringers  bolus 1,000 mL (1,000 mLs Intravenous New Bag/Given 08/20/23 0620)     IMPRESSION / MDM /  ASSESSMENT AND PLAN / ED COURSE  I reviewed the triage vital signs and the nursing notes.                              Differential diagnosis includes, but is not limited to, possible sepsis, pneumonia, UTI, fracture, contusion, strain, sprain, electrolyte derangements, hyperammonemia, consider intracranial hemorrhage, decompensated psych.  Will get labs, EKG, troponin, chest x-ray, CT head, cervical spine, hip x-ray, chest x-ray, UA.  Patient's presentation is most consistent with acute presentation with potential threat to life or bodily function.  Independent interpretation of labs and imaging below.  Patient signed out pending rest of labs as well as CT imaging, likely to be admitted for further management.  The patient is on the cardiac monitor to evaluate for evidence of arrhythmia and/or significant heart rate changes.   Clinical Course as of 08/20/23 0711  Thu Aug 20, 2023  0644 CT Head Wo Contrast No evidence of acute intracranial or cervical spine injury.  [TT]  1610 DG Chest Port 1 View No active disease.  [TT]  [id number] DG Hip Unilat With Pelvis 2-3 Views Left IMPRESSION: No acute findings.  Moderate left and severe right hip osteoarthritis.   [TT]  [id number] Independent review of labs, no leukocytosis, lactate is mildly elevated. [TT]    Clinical Course User Index [TT] Drenda Gentle Richard Champion, MD     FINAL CLINICAL IMPRESSION(S) / ED DIAGNOSES   Final diagnoses:  Hypoxia  Weakness  Fall, initial encounter  Pain of left hip  Altered mental status, unspecified altered mental status type     Rx / DC Orders   ED Discharge Orders     None        Note:  This document was prepared using Dragon voice recognition software and may include unintentional dictation errors.    Shane Darling, MD 08/20/23 [id number]

## 2023-08-21 ENCOUNTER — Ambulatory Visit: Payer: Self-pay | Admitting: Internal Medicine

## 2023-08-21 DIAGNOSIS — E876 Hypokalemia: Secondary | ICD-10-CM | POA: Diagnosis present

## 2023-08-21 DIAGNOSIS — R296 Repeated falls: Secondary | ICD-10-CM | POA: Diagnosis present

## 2023-08-21 DIAGNOSIS — R531 Weakness: Secondary | ICD-10-CM | POA: Diagnosis present

## 2023-08-21 DIAGNOSIS — M5116 Intervertebral disc disorders with radiculopathy, lumbar region: Secondary | ICD-10-CM | POA: Diagnosis present

## 2023-08-21 DIAGNOSIS — G894 Chronic pain syndrome: Secondary | ICD-10-CM | POA: Diagnosis present

## 2023-08-21 DIAGNOSIS — M797 Fibromyalgia: Secondary | ICD-10-CM | POA: Diagnosis present

## 2023-08-21 DIAGNOSIS — E872 Acidosis, unspecified: Secondary | ICD-10-CM | POA: Diagnosis present

## 2023-08-21 DIAGNOSIS — K219 Gastro-esophageal reflux disease without esophagitis: Secondary | ICD-10-CM | POA: Diagnosis present

## 2023-08-21 DIAGNOSIS — G929 Unspecified toxic encephalopathy: Secondary | ICD-10-CM | POA: Diagnosis present

## 2023-08-21 DIAGNOSIS — Y92029 Unspecified place in mobile home as the place of occurrence of the external cause: Secondary | ICD-10-CM | POA: Diagnosis not present

## 2023-08-21 DIAGNOSIS — J4489 Other specified chronic obstructive pulmonary disease: Secondary | ICD-10-CM | POA: Diagnosis present

## 2023-08-21 DIAGNOSIS — I7300 Raynaud's syndrome without gangrene: Secondary | ICD-10-CM | POA: Diagnosis present

## 2023-08-21 DIAGNOSIS — E039 Hypothyroidism, unspecified: Secondary | ICD-10-CM | POA: Diagnosis present

## 2023-08-21 DIAGNOSIS — F419 Anxiety disorder, unspecified: Secondary | ICD-10-CM | POA: Diagnosis present

## 2023-08-21 DIAGNOSIS — T50915A Adverse effect of multiple unspecified drugs, medicaments and biological substances, initial encounter: Secondary | ICD-10-CM | POA: Diagnosis present

## 2023-08-21 DIAGNOSIS — Z1152 Encounter for screening for COVID-19: Secondary | ICD-10-CM | POA: Diagnosis not present

## 2023-08-21 DIAGNOSIS — R0902 Hypoxemia: Secondary | ICD-10-CM | POA: Diagnosis present

## 2023-08-21 DIAGNOSIS — F319 Bipolar disorder, unspecified: Secondary | ICD-10-CM | POA: Diagnosis present

## 2023-08-21 DIAGNOSIS — E785 Hyperlipidemia, unspecified: Secondary | ICD-10-CM | POA: Diagnosis present

## 2023-08-21 DIAGNOSIS — K5900 Constipation, unspecified: Secondary | ICD-10-CM | POA: Diagnosis present

## 2023-08-21 DIAGNOSIS — W19XXXA Unspecified fall, initial encounter: Secondary | ICD-10-CM | POA: Diagnosis present

## 2023-08-21 DIAGNOSIS — G928 Other toxic encephalopathy: Secondary | ICD-10-CM | POA: Diagnosis present

## 2023-08-21 DIAGNOSIS — M160 Bilateral primary osteoarthritis of hip: Secondary | ICD-10-CM | POA: Diagnosis present

## 2023-08-21 DIAGNOSIS — E66812 Obesity, class 2: Secondary | ICD-10-CM | POA: Diagnosis present

## 2023-08-21 DIAGNOSIS — F05 Delirium due to known physiological condition: Secondary | ICD-10-CM | POA: Diagnosis present

## 2023-08-21 DIAGNOSIS — I10 Essential (primary) hypertension: Secondary | ICD-10-CM | POA: Diagnosis present

## 2023-08-21 DIAGNOSIS — Z6839 Body mass index (BMI) 39.0-39.9, adult: Secondary | ICD-10-CM | POA: Diagnosis not present

## 2023-08-21 DIAGNOSIS — F17210 Nicotine dependence, cigarettes, uncomplicated: Secondary | ICD-10-CM | POA: Diagnosis present

## 2023-08-21 LAB — BLOOD GAS, VENOUS
Acid-Base Excess: 1.7 mmol/L (ref 0.0–2.0)
Bicarbonate: 26.6 mmol/L (ref 20.0–28.0)
O2 Saturation: 95 %
Patient temperature: 37
pCO2, Ven: 42 mmHg — ABNORMAL LOW (ref 44–60)
pH, Ven: 7.41 (ref 7.25–7.43)
pO2, Ven: 69 mmHg — ABNORMAL HIGH (ref 32–45)

## 2023-08-21 LAB — CBC
HCT: 43.9 % (ref 36.0–46.0)
Hemoglobin: 14.8 g/dL (ref 12.0–15.0)
MCH: 30.9 pg (ref 26.0–34.0)
MCHC: 33.7 g/dL (ref 30.0–36.0)
MCV: 91.6 fL (ref 80.0–100.0)
Platelets: 140 10*3/uL — ABNORMAL LOW (ref 150–400)
RBC: 4.79 MIL/uL (ref 3.87–5.11)
RDW: 14.9 % (ref 11.5–15.5)
WBC: 9.3 10*3/uL (ref 4.0–10.5)
nRBC: 0 % (ref 0.0–0.2)

## 2023-08-21 LAB — COMPREHENSIVE METABOLIC PANEL WITH GFR
ALT: 21 U/L (ref 0–44)
AST: 36 U/L (ref 15–41)
Albumin: 3 g/dL — ABNORMAL LOW (ref 3.5–5.0)
Alkaline Phosphatase: 57 U/L (ref 38–126)
Anion gap: 14 (ref 5–15)
BUN: 7 mg/dL — ABNORMAL LOW (ref 8–23)
CO2: 21 mmol/L — ABNORMAL LOW (ref 22–32)
Calcium: 8.1 mg/dL — ABNORMAL LOW (ref 8.9–10.3)
Chloride: 103 mmol/L (ref 98–111)
Creatinine, Ser: 0.42 mg/dL — ABNORMAL LOW (ref 0.44–1.00)
GFR, Estimated: >60 mL/min (ref >60)
Glucose, Bld: 97 mg/dL (ref 70–99)
Potassium: 3.1 mmol/L — ABNORMAL LOW (ref 3.5–5.1)
Sodium: 138 mmol/L (ref 135–145)
Total Bilirubin: 1.4 mg/dL — ABNORMAL HIGH (ref 0.0–1.2)
Total Protein: 5.7 g/dL — ABNORMAL LOW (ref 6.5–8.1)

## 2023-08-21 LAB — PHOSPHORUS: Phosphorus: 2.9 mg/dL (ref 2.5–4.6)

## 2023-08-21 LAB — MAGNESIUM: Magnesium: 1.9 mg/dL (ref 1.7–2.4)

## 2023-08-21 LAB — HIV ANTIBODY (ROUTINE TESTING W REFLEX): HIV Screen 4th Generation wRfx: NONREACTIVE

## 2023-08-21 LAB — GLUCOSE, CAPILLARY: Glucose-Capillary: 113 mg/dL — ABNORMAL HIGH (ref 70–99)

## 2023-08-21 MED ORDER — ROSUVASTATIN CALCIUM 20 MG PO TABS
40.0000 mg | ORAL_TABLET | Freq: Every day | ORAL | Status: DC
  Administered 2023-08-21 – 2023-08-25 (×5): 40 mg via ORAL
  Filled 2023-08-21 (×5): qty 2

## 2023-08-21 MED ORDER — PREGABALIN 50 MG PO CAPS
100.0000 mg | ORAL_CAPSULE | Freq: Two times a day (BID) | ORAL | Status: DC
  Administered 2023-08-21 – 2023-08-25 (×8): 100 mg via ORAL
  Filled 2023-08-21 (×8): qty 2

## 2023-08-21 MED ORDER — METOPROLOL SUCCINATE ER 50 MG PO TB24
50.0000 mg | ORAL_TABLET | Freq: Every day | ORAL | Status: DC
  Administered 2023-08-21 – 2023-08-24 (×4): 50 mg via ORAL
  Filled 2023-08-21 (×4): qty 1

## 2023-08-21 MED ORDER — MORPHINE SULFATE ER 15 MG PO TBCR
15.0000 mg | EXTENDED_RELEASE_TABLET | Freq: Three times a day (TID) | ORAL | Status: DC
  Administered 2023-08-21 – 2023-08-25 (×13): 15 mg via ORAL
  Filled 2023-08-21 (×13): qty 1

## 2023-08-21 MED ORDER — LEVOTHYROXINE SODIUM 88 MCG PO TABS
88.0000 ug | ORAL_TABLET | Freq: Every day | ORAL | Status: DC
  Administered 2023-08-21 – 2023-08-25 (×5): 88 ug via ORAL
  Filled 2023-08-21 (×5): qty 1

## 2023-08-21 MED ORDER — NICOTINE 21 MG/24HR TD PT24
21.0000 mg | MEDICATED_PATCH | Freq: Every day | TRANSDERMAL | Status: DC
  Administered 2023-08-21 – 2023-08-25 (×5): 21 mg via TRANSDERMAL
  Filled 2023-08-21 (×5): qty 1

## 2023-08-21 MED ORDER — VENLAFAXINE HCL ER 75 MG PO CP24
150.0000 mg | ORAL_CAPSULE | Freq: Every evening | ORAL | Status: DC
  Administered 2023-08-21 – 2023-08-25 (×5): 150 mg via ORAL
  Filled 2023-08-21 (×5): qty 2

## 2023-08-21 MED ORDER — ASPIRIN 81 MG PO TBEC
81.0000 mg | DELAYED_RELEASE_TABLET | Freq: Every day | ORAL | Status: DC
  Administered 2023-08-21 – 2023-08-25 (×5): 81 mg via ORAL
  Filled 2023-08-21 (×5): qty 1

## 2023-08-21 MED ORDER — ZIPRASIDONE HCL 40 MG PO CAPS
40.0000 mg | ORAL_CAPSULE | Freq: Every evening | ORAL | Status: DC
Start: 2023-08-21 — End: 2023-08-25
  Administered 2023-08-21 – 2023-08-25 (×5): 40 mg via ORAL
  Filled 2023-08-21 (×5): qty 1

## 2023-08-21 MED ORDER — OXYCODONE HCL 5 MG PO TABS
10.0000 mg | ORAL_TABLET | Freq: Four times a day (QID) | ORAL | Status: DC | PRN
  Administered 2023-08-22 – 2023-08-25 (×3): 10 mg via ORAL
  Filled 2023-08-21 (×4): qty 2

## 2023-08-21 MED ORDER — POTASSIUM CHLORIDE CRYS ER 20 MEQ PO TBCR
40.0000 meq | EXTENDED_RELEASE_TABLET | Freq: Once | ORAL | Status: AC
  Administered 2023-08-21: 40 meq via ORAL
  Filled 2023-08-21: qty 2

## 2023-08-21 NOTE — Evaluation (Signed)
 Physical Therapy Evaluation Patient Details Name: April Soto MRN: [id number] DOB: 1960-09-12 Today's Date: 08/21/2023  History of Present Illness  63 y.o. female with medical history significant of obesity, chronic back pain, HTN, bipolar disorder, GERD, asthma, fibromyalgia, hypothyroidism, anxiety presenting with encephalopathy.  Clinical Impression  Patient alert, agreeable to PT, oriented x4. Pt at baseline lives in an RV in her sons yard, ambulatory with RW (though family endorsed she has not mobilized often or well recently). Family assists with ADLs and IADLs.   She was able to transfer to EOB supervision with rails, extra time. Sit <> stand initially minAx2 and step pivot to [facility]. Able to perform pericare in sitting, CGA. And then ambulated ~36ft CGA-minA with RW  to chair. Endorsed fatigue after activity.  Overall the patient demonstrated deficits (see "PT Problem List") that impede the patient's functional abilities, safety, and mobility and would benefit from skilled PT intervention.        If plan is discharge home, recommend the following: A little help with walking and/or transfers;A little help with bathing/dressing/bathroom;Assistance with cooking/housework;Assist for transportation;Help with stairs or ramp for entrance   Can travel by private vehicle   Yes    Equipment Recommendations Other (comment) (TBD)  Recommendations for Other Services       Functional Status Assessment Patient has had a recent decline in their functional status and demonstrates the ability to make significant improvements in function in a reasonable and predictable amount of time.     Precautions / Restrictions Precautions Precautions: Fall Recall of Precautions/Restrictions: Intact Restrictions Weight Bearing Restrictions Per Provider Order: No      Mobility  Bed Mobility Overal bed mobility: Needs Assistance Bed Mobility: Supine to Sit     Supine to sit: Supervision, HOB elevated,  Used rails          Transfers Overall transfer level: Needs assistance Equipment used: Rolling walker (2 wheels) Transfers: Sit to/from Stand Sit to Stand: Min assist, +2 physical assistance, +2 safety/equipment           General transfer comment: initial STS from EOB MIN A +2 with pt citing stiffness, improving to MIN A +1 from B [place]    Ambulation/Gait Ambulation/Gait assistance: Contact guard assist, Min assist Gait Distance (Feet): 3 Feet Assistive device: Rolling walker (2 wheels)         General Gait Details: able to take several steps from [facility] to recliner, reported fatigue with activity  Stairs            Wheelchair Mobility     Tilt Bed    Modified Rankin (Stroke Patients Only)       Balance Overall balance assessment: Needs assistance Sitting-balance support: Feet supported, No upper extremity supported Sitting balance-Leahy Scale: Good     Standing balance support: Single extremity supported, Bilateral upper extremity supported, During functional activity Standing balance-Leahy Scale: Fair                               Pertinent Vitals/Pain Pain Assessment Pain Assessment: Faces Faces Pain Scale: Hurts little more Pain Location: RLE Pain Descriptors / Indicators: Aching Pain Intervention(s): Limited activity within patient's tolerance, Monitored during session, Utilized relaxation techniques    Home Living Family/patient expects to be discharged to:: Private residence Living Arrangements: Other (Comment);Alone (pt lives by herself in an RV in the driveway of son's property)   Type of Home: Other(Comment) (RV) Home Access: Stairs to  enter   Entrance Stairs-Number of Steps: a few   Home Layout: One level Home Equipment: Agricultural consultant (2 wheels);Rollator (4 wheels)      Prior Function Prior Level of Function : Needs assist;History of Falls (last six months)             Mobility Comments: RW, multiple falls  recently ADLs Comments: Per son, family assists with meals, cleaning; pt endorses indep with ADL and med mgt     Extremity/Trunk Assessment   Upper Extremity Assessment Upper Extremity Assessment: Generalized weakness    Lower Extremity Assessment Lower Extremity Assessment: Generalized weakness       Communication   Communication Communication: No apparent difficulties    Cognition Arousal: Alert Behavior During Therapy: Anxious                             Following commands: Intact       Cueing Cueing Techniques: Verbal cues     General Comments      Exercises     Assessment/Plan    PT Assessment Patient needs continued PT services  PT Problem List Decreased strength;Decreased range of motion;Decreased balance;Decreased activity tolerance;Decreased knowledge of use of DME;Decreased mobility;Decreased knowledge of precautions       PT Treatment Interventions DME instruction;Balance training;Gait training;Neuromuscular re-education;Stair training;Patient/family education;Functional mobility training;Therapeutic activities;Therapeutic exercise    PT Goals (Current goals can be found in the Care Plan section)  Acute Rehab PT Goals Patient Stated Goal: to go home PT Goal Formulation: With patient Time For Goal Achievement: 09/04/23 Potential to Achieve Goals: Good    Frequency Min 2X/week     Co-evaluation PT/OT/SLP Co-Evaluation/Treatment: Yes Reason for Co-Treatment: To address functional/ADL transfers;For patient/therapist safety PT goals addressed during session: Mobility/safety with mobility;Balance;Proper use of DME OT goals addressed during session: ADL's and self-care;Proper use of Adaptive equipment and DME       AM-PAC PT "6 Clicks" Mobility  Outcome Measure Help needed turning from your back to your side while in a flat bed without using bedrails?: A Little Help needed moving from lying on your back to sitting on the side of a  flat bed without using bedrails?: A Little Help needed moving to and from a bed to a chair (including a wheelchair)?: A Little Help needed standing up from a chair using your arms (e.g., wheelchair or bedside chair)?: A Little Help needed to walk in hospital room?: A Little Help needed climbing 3-5 steps with a railing? : A Lot 6 Click Score: 17    End of Session Equipment Utilized During Treatment: Gait belt Activity Tolerance: Patient limited by fatigue Patient left: in chair;with call bell/phone within reach Nurse Communication: Mobility status PT Visit Diagnosis: Other abnormalities of gait and mobility (R26.89);Difficulty in walking, not elsewhere classified (R26.2);Muscle weakness (generalized) (M62.81)    Time: 8119-1478 PT Time Calculation (min) (ACUTE ONLY): 25 min   Charges:   PT Evaluation $PT Eval Low Complexity: 1 Low PT Treatments $Therapeutic Activity: 8-22 mins PT General Charges $$ ACUTE PT VISIT: 1 Visit         Darien Eden PT, DPT 1:22 PM,08/21/23

## 2023-08-21 NOTE — Progress Notes (Addendum)
 Progress Note    April Soto  ZOX:[id number] DOB: 03/11/1961  DOA: 08/20/2023 PCP: Carollynn Cirri, NP      Brief Narrative:    Medical records reviewed and are as summarized below:  April Soto is a 63 y.o. female with medical history significant for obesity, chronic back pain on opioids from the pain clinic, bilateral hip osteoarthritis, hypertension, hypothyroidism, anxiety, who was brought to the hospital because of abnormal worsening confusion at home.  She lives in a separate RV from the main house at home.  She has had  worsening agitation and hallucinations.  She was admitted to the hospital for acute confusional state probably due to toxic metabolic encephalopathy from polypharmacy.      Assessment/Plan:   Principal Problem:   Toxic encephalopathy Active Problems:   Essential hypertension   Acquired hypothyroidism   COPD (chronic obstructive pulmonary disease) (HCC)    Body mass index is 39.21 kg/m.  (Obesity)   Altered mental status, suspected acute toxic metabolic encephalopathy: Improved.  Patient is feeling better.  However, her son thinks she is not at baseline because patient had reported seen imaginary things this morning. Reviewed home medications.  Patient takes MS Contin , Lyrica , oxycodone  as needed for pain.  She says she does not take Flexeril  and Atarax . We discussed the possibility that her pain medicines may be responsible for the acute change in mental status.  She says she has been taking this for a long time and doubts that pain medicines are the culprits.  She is not willing to cut back at this time.  However, I told her to discuss pain medicines with her physician at the pain clinic.  Restart MS Contin  15 mg every 8 hours, oxycodone  every 6 hours as needed.  Decrease Lyrica  from 100 mg 3 times daily to 100 mg twice daily.   Hypokalemia: Replete potassium and monitor levels   Lactic acidosis: Improved.  Etiology is unclear.  No  evidence of sepsis or infection thus far.   COPD: Stable.  Bronchodilators as needed Tobacco use disorder: prescribed nicotine  patch.   General Weakness: PT recommended discharge to SNF.  Consulted transition of care team  to assist with placement.   Comorbidities include history of stroke, hypothyroidism, chronic back pain, hip osteoarthritis, bipolar 1 disorder, anxiety, depression, hypertension, hyperlipidemia, GERD, constipation   Plan of care was discussed with [name], son, over the phone.    Diet Order             Diet heart healthy/carb modified Room service appropriate? Yes; Fluid consistency: Thin  Diet effective now                            Consultants: None  Procedures: None    Medications:    aspirin  EC  81 mg Oral Daily   enoxaparin  (LOVENOX ) injection  0.5 mg/kg Subcutaneous Q24H   levothyroxine   88 mcg Oral Q0600   metoprolol  succinate  50 mg Oral QHS   morphine   15 mg Oral TID   pregabalin   100 mg Oral BID   rosuvastatin   40 mg Oral Daily   venlafaxine  XR  150 mg Oral QPM   ziprasidone   40 mg Oral QPM   Continuous Infusions:  sodium chloride  75 mL/hr at 08/20/23 2119     Anti-infectives (From admission, onward)    None  Family Communication/Anticipated D/C date and plan/Code Status   DVT prophylaxis:      Code Status: Full Code  Family Communication: Plan discussed with [name], son, over the phone Disposition Plan: Plan to discharge to SNF   Status is: Observation The patient will require care spanning > 2 midnights and should be moved to inpatient because: Encephalopathy, general weakness       Subjective:   Interval events noted.  She has no complaints.  She says she is feeling better.  She said her son was worried about her yesterday because she was confused but she thinks "I am fine now".  I spoke with her son today and he was very concerned about patient's safety at home.  She thinks  patient is not at baseline but she is saying she is okay because she wants to go home.  Objective:    Vitals:   08/20/23 2353 08/21/23 0321 08/21/23 0841 08/21/23 1104  BP: 123/86 105/88 137/81 133/83  Pulse: (!) 101 99 98 (!) 105  Resp: 18 17 20    Temp: 97.7 F (36.5 C)  98 F (36.7 C) 97.6 F (36.4 C)  TempSrc: Oral  Oral   SpO2: 97% 95% 95% 94%  Weight:      Height:       No data found.   Intake/Output Summary (Last 24 hours) at 08/21/2023 1457 Last data filed at 08/21/2023 1455 Gross per 24 hour  Intake 0 ml  Output 250 ml  Net -250 ml   Filed Weights   08/20/23 0521  Weight: 110.2 kg    Exam:  GEN: NAD SKIN: Warm and dry EYES: No pallor or icterus ENT: MMM CV: RRR PULM: CTA B ABD: soft, ND, NT, +BS CNS: AAO x 3, non focal EXT: No edema or tenderness        Data Reviewed:   I have personally reviewed following labs and imaging studies:  Labs: Labs show the following:   Basic Metabolic Panel: Recent Labs  Lab 08/20/23 0525 08/21/23 0434  NA 137 138  K 4.3 3.1*  CL 100 103  CO2 27 21*  GLUCOSE 106* 97  BUN 13 7*  CREATININE 0.80 0.42*  CALCIUM  9.1 8.1*  MG  --  1.9  PHOS  --  2.9   GFR Estimated Creatinine Clearance: 89.4 mL/min (A) (by C-G formula based on SCr of 0.42 mg/dL (L)). Liver Function Tests: Recent Labs  Lab 08/20/23 0525 08/21/23 0434  AST 32 36  ALT 19 21  ALKPHOS 73 57  BILITOT 1.3* 1.4*  PROT 6.6 5.7*  ALBUMIN 3.3* 3.0*   No results for input(s): "LIPASE", "AMYLASE" in the last 168 hours. Recent Labs  Lab 08/20/23 0621  AMMONIA 19   Coagulation profile Recent Labs  Lab 08/20/23 0525  INR 1.2    CBC: Recent Labs  Lab 08/20/23 0525 08/21/23 0434  WBC 10.9* 9.3  NEUTROABS 7.2  --   HGB 16.5* 14.8  HCT 50.9* 43.9  MCV 95.3 91.6  PLT 167 140*   Cardiac Enzymes: No results for input(s): "CKTOTAL", "CKMB", "CKMBINDEX", "TROPONINI" in the last 168 hours. BNP (last 3 results) No results for  input(s): "PROBNP" in the last 8760 hours. CBG: No results for input(s): "GLUCAP" in the last 168 hours. D-Dimer: No results for input(s): "DDIMER" in the last 72 hours. Hgb A1c: No results for input(s): "HGBA1C" in the last 72 hours. Lipid Profile: No results for input(s): "CHOL", "HDL", "LDLCALC", "TRIG", "CHOLHDL", "LDLDIRECT" in the last  72 hours. Thyroid  function studies: Recent Labs    08/20/23 0525  TSH 3.020   Anemia work up: No results for input(s): "VITAMINB12", "FOLATE", "FERRITIN", "TIBC", "IRON", "RETICCTPCT" in the last 72 hours. Sepsis Labs: Recent Labs  Lab 08/20/23 0525 08/20/23 1158 08/21/23 0434  WBC 10.9*  --  9.3  LATICACIDVEN 2.2* 1.1  --     Microbiology Recent Results (from the past 240 hours)  Culture, blood (Routine x 2)     Status: None (Preliminary result)   Collection Time: 08/20/23  5:25 AM   Specimen: BLOOD  Result Value Ref Range Status   Specimen Description BLOOD LEFT AC  Final   Special Requests   Final    BOTTLES DRAWN AEROBIC AND ANAEROBIC Blood Culture results may not be optimal due to an inadequate volume of blood received in culture bottles   Culture   Final    NO GROWTH < 24 HOURS Performed at [facility], [address] [place], [place] [address]    Report Status PENDING  Incomplete  Culture, blood (Routine x 2)     Status: None (Preliminary result)   Collection Time: 08/20/23  5:25 AM   Specimen: BLOOD  Result Value Ref Range Status   Specimen Description BLOOD RIGHT HAND  Final   Special Requests   Final    BOTTLES DRAWN AEROBIC AND ANAEROBIC Blood Culture results may not be optimal due to an inadequate volume of blood received in culture bottles   Culture   Final    NO GROWTH < 24 HOURS Performed at [facility], [address], [place], [place] [address]    Report Status PENDING  Incomplete  Resp panel by RT-PCR (RSV, Flu A&B, Covid) Anterior Nasal Swab     Status: None   Collection Time:  08/20/23  6:21 AM   Specimen: Anterior Nasal Swab  Result Value Ref Range Status   SARS Coronavirus 2 by RT PCR NEGATIVE NEGATIVE Final    Comment: (NOTE) SARS-CoV-2 target nucleic acids are NOT DETECTED.  The SARS-CoV-2 RNA is generally detectable in upper respiratory specimens during the acute phase of infection. The lowest concentration of SARS-CoV-2 viral copies this assay can detect is 138 copies/mL. A negative result does not preclude SARS-Cov-2 infection and should not be used as the sole basis for treatment or other patient management decisions. A negative result may occur with  improper specimen collection/handling, submission of specimen other than nasopharyngeal swab, presence of viral mutation(s) within the areas targeted by this assay, and inadequate number of viral copies(<138 copies/mL). A negative result must be combined with clinical observations, patient history, and epidemiological information. The expected result is Negative.  Fact Sheet for Patients:  BloggerCourse.com  Fact Sheet for Healthcare Providers:  SeriousBroker.it  This test is no t yet approved or cleared by the [place]  FDA and  has been authorized for detection and/or diagnosis of SARS-CoV-2 by FDA under an Emergency Use Authorization (EUA). This EUA will remain  in effect (meaning this test can be used) for the duration of the COVID-19 declaration under Section 564(b)(1) of the Act, 21 U.S.C.section 360bbb-3(b)(1), unless the authorization is terminated  or revoked sooner.       Influenza A by PCR NEGATIVE NEGATIVE Final   Influenza B by PCR NEGATIVE NEGATIVE Final    Comment: (NOTE) The Xpert Xpress SARS-CoV-2/FLU/RSV plus assay is intended as an aid in the diagnosis of influenza from Nasopharyngeal swab specimens and should not be used as a  sole basis for treatment. Nasal washings and aspirates are unacceptable for Xpert Xpress  SARS-CoV-2/FLU/RSV testing.  Fact Sheet for Patients: BloggerCourse.com  Fact Sheet for Healthcare Providers: SeriousBroker.it  This test is not yet approved or cleared by the [place]  FDA and has been authorized for detection and/or diagnosis of SARS-CoV-2 by FDA under an Emergency Use Authorization (EUA). This EUA will remain in effect (meaning this test can be used) for the duration of the COVID-19 declaration under Section 564(b)(1) of the Act, 21 U.S.C. section 360bbb-3(b)(1), unless the authorization is terminated or revoked.     Resp Syncytial Virus by PCR NEGATIVE NEGATIVE Final    Comment: (NOTE) Fact Sheet for Patients: BloggerCourse.com  Fact Sheet for Healthcare Providers: SeriousBroker.it  This test is not yet approved or cleared by the [place]  FDA and has been authorized for detection and/or diagnosis of SARS-CoV-2 by FDA under an Emergency Use Authorization (EUA). This EUA will remain in effect (meaning this test can be used) for the duration of the COVID-19 declaration under Section 564(b)(1) of the Act, 21 U.S.C. section 360bbb-3(b)(1), unless the authorization is terminated or revoked.  Performed at [facility], [address] [place], [place] [address]     Procedures and diagnostic studies:  CT PELVIS WO CONTRAST Result Date: 08/20/2023 CLINICAL DATA:  Hip trauma, fracture suspected EXAM: CT PELVIS WITHOUT CONTRAST TECHNIQUE: Multidetector CT imaging of the pelvis was performed following the standard protocol without intravenous contrast. RADIATION DOSE REDUCTION: This exam was performed according to the departmental dose-optimization program which includes automated exposure control, adjustment of the mA and/or kV according to patient size and/or use of iterative reconstruction technique. COMPARISON:  Pelvis radiography from earlier  today FINDINGS: No acute fracture or dislocation. Degenerative spurring with hip joint narrowing on the right more than left, severe on the right. L4-5 solid arthrodesis. Lumbosacral disc degeneration. No evidence of pelvic ring fracture or diastasis. No acute soft tissue finding. IMPRESSION: No acute finding. Right more than left hip osteoarthritis, particularly severe on the right. Electronically Signed   By: Ronnette Coke M.D.   On: 08/20/2023 07:55   DG Hip Unilat With Pelvis 2-3 Views Left Result Date: 08/20/2023 CLINICAL DATA:  Several falls in last 2 days.  Left hip pain. EXAM: DG HIP (WITH OR WITHOUT PELVIS) 2-3V LEFT COMPARISON:  None Available. FINDINGS: There is no evidence of hip fracture or dislocation. Moderate left hip osteoarthritis is seen. Severe right hip osteoarthritis is also demonstrated. Generalized osteopenia noted. Fusion hardware noted in lower lumbar spine. IMPRESSION: No acute findings. Moderate left and severe right hip osteoarthritis. Electronically Signed   By: Marlyce Sine M.D.   On: 08/20/2023 06:11   CT Head Wo Contrast Result Date: 08/20/2023 CLINICAL DATA:  Neck trauma, intoxication or obtain ended. Multiple falls over 2 days. Code sepsis EXAM: CT HEAD WITHOUT CONTRAST CT CERVICAL SPINE WITHOUT CONTRAST TECHNIQUE: Multidetector CT imaging of the head and cervical spine was performed following the standard protocol without intravenous contrast. Multiplanar CT image reconstructions of the cervical spine were also generated. RADIATION DOSE REDUCTION: This exam was performed according to the departmental dose-optimization program which includes automated exposure control, adjustment of the mA and/or kV according to patient size and/or use of iterative reconstruction technique. COMPARISON:  None Available. FINDINGS: CT HEAD FINDINGS Brain: No evidence of acute infarction, hemorrhage, hydrocephalus, extra-axial collection or mass lesion/mass effect. Moderately extensive chronic  small vessel ischemia in the cerebral white matter. Mild cerebral volume loss. Vascular: No hyperdense  vessel or unexpected calcification. Skull: Normal. Negative for fracture or focal lesion. Sinuses/Orbits: No acute finding CT CERVICAL SPINE FINDINGS Alignment: No traumatic malalignment Skull base and vertebrae: No acute fracture. No primary bone lesion or focal pathologic process. Soft tissues and spinal canal: No prevertebral fluid or swelling. No visible canal hematoma. Disc levels:  Generalized degenerative endplate spurring. Upper chest: No evidence of injury IMPRESSION: No evidence of acute intracranial or cervical spine injury. Electronically Signed   By: Ronnette Coke M.D.   On: 08/20/2023 06:10   CT Cervical Spine Wo Contrast Result Date: 08/20/2023 CLINICAL DATA:  Neck trauma, intoxication or obtain ended. Multiple falls over 2 days. Code sepsis EXAM: CT HEAD WITHOUT CONTRAST CT CERVICAL SPINE WITHOUT CONTRAST TECHNIQUE: Multidetector CT imaging of the head and cervical spine was performed following the standard protocol without intravenous contrast. Multiplanar CT image reconstructions of the cervical spine were also generated. RADIATION DOSE REDUCTION: This exam was performed according to the departmental dose-optimization program which includes automated exposure control, adjustment of the mA and/or kV according to patient size and/or use of iterative reconstruction technique. COMPARISON:  None Available. FINDINGS: CT HEAD FINDINGS Brain: No evidence of acute infarction, hemorrhage, hydrocephalus, extra-axial collection or mass lesion/mass effect. Moderately extensive chronic small vessel ischemia in the cerebral white matter. Mild cerebral volume loss. Vascular: No hyperdense vessel or unexpected calcification. Skull: Normal. Negative for fracture or focal lesion. Sinuses/Orbits: No acute finding CT CERVICAL SPINE FINDINGS Alignment: No traumatic malalignment Skull base and vertebrae: No acute  fracture. No primary bone lesion or focal pathologic process. Soft tissues and spinal canal: No prevertebral fluid or swelling. No visible canal hematoma. Disc levels:  Generalized degenerative endplate spurring. Upper chest: No evidence of injury IMPRESSION: No evidence of acute intracranial or cervical spine injury. Electronically Signed   By: Ronnette Coke M.D.   On: 08/20/2023 06:10   DG Chest Port 1 View Result Date: 08/20/2023 CLINICAL DATA:  Shortness of breath.  Hypoxia.  Possible sepsis EXAM: PORTABLE CHEST 1 VIEW COMPARISON:  03/14/2022 FINDINGS: The heart size and mediastinal contours are within normal limits. Both lungs are clear. The visualized skeletal structures are unremarkable. IMPRESSION: No active disease. Electronically Signed   By: Marlyce Sine M.D.   On: 08/20/2023 06:09               LOS: 0 days   Lannie Heaps  [facility]   Pager on [email or web address]. If 7PM-7AM, please contact night-coverage at [email or web address]     08/21/2023, 2:57 PM

## 2023-08-21 NOTE — TOC Progression Note (Addendum)
 Transition of Care [facility]) - Progression Note    Patient Details  Name: April Soto MRN: [id number] Date of Birth: 09-Oct-1960  Transition of [facility]) CM/SW Contact  Elsie Halo, RN Phone Number: 08/21/2023, 1:25 PM  Clinical Narrative:    PT/OT recommending SNF. FL2 sent out to [place] TOC will provide bed offers and offer choice to patient.   TOC spoke with the patient's son, [name] [phone] Patient lives in an RV in her son's driveway. The entryway to the RV has 3 stairs. The patient's son shared that she fell 3 times over a 2 day span. She has a RW at home.   Patient and son provided bed offers from [facility], [facility], and [facility]. They choose [facility].  Insurance Auth is needed   TOC will continue to follow        Expected Discharge Plan and Services                                               Social Determinants of Health (SDOH) Interventions SDOH Screenings   Food Insecurity: No Food Insecurity (08/20/2023)  Housing: Unknown (08/20/2023)  Transportation Needs: No Transportation Needs (08/15/2022)  Utilities: Not At Risk (08/15/2022)  Alcohol Screen: Low Risk  (02/20/2023)  Depression (PHQ2-9): High Risk (02/20/2023)  Financial Resource Strain: Low Risk  (08/15/2022)  Physical Activity: Inactive (08/15/2022)  Social Connections: Socially Isolated (08/15/2022)  Stress: No Stress Concern Present (08/15/2022)  Tobacco Use: High Risk (08/20/2023)    Readmission Risk Interventions     No data to display

## 2023-08-21 NOTE — Evaluation (Signed)
 Occupational Therapy Evaluation Patient Details Name: April Soto MRN: [id number] DOB: [birth year] Today's Date: 08/21/2023   History of Present Illness   63 y.o. female with medical history significant of obesity, chronic back pain, HTN, bipolar disorder, GERD, asthma, fibromyalgia, hypothyroidism, anxiety presenting with encephalopathy.    Clinical Impressions Pt was seen for OT evaluation and co-tx with PT this date. Prior to hospital admission, pt was living alone in an RV in the driveway of her son's home and per family required assist for meals and cleaning. Son endorses pt is "far from baseline" but pt denies feeling poorly. Pt reports indep with ADL and light meal prep as well as medication mgt using paper ("I write it down") to ensure adherence. Pt presents to acute OT demonstrating impaired ADL performance and functional mobility 2/2 questionable cognition, RLE pain, decreased strength, balance, and pt appearing anxious throughout session (See OT problem list for additional functional deficits). Pt required initial MIN A +2 for STS from EOB, improving to MIN A +1 for repeated STS from [facility] during pericare. MIN A for thoroughness with pericare after loose BMs. Pt would benefit from skilled OT services to address noted impairments and functional limitations (see below for any additional details) in order to maximize safety and independence while minimizing falls risk and caregiver burden. Anticipate the need for follow up OT services upon acute hospital DC.    If plan is discharge home, recommend the following:   A little help with walking and/or transfers;A little help with bathing/dressing/bathroom;Assistance with cooking/housework;Assist for transportation;Help with stairs or ramp for entrance;Direct supervision/assist for medications management     Functional Status Assessment   Patient has had a recent decline in their functional status and demonstrates the ability to make  significant improvements in function in a reasonable and predictable amount of time.     Equipment Recommendations   Other (comment) (defer)     Recommendations for Other Services         Precautions/Restrictions   Precautions Precautions: Fall Restrictions Weight Bearing Restrictions Per Provider Order: No     Mobility Bed Mobility Overal bed mobility: Needs Assistance Bed Mobility: Supine to Sit     Supine to sit: Supervision, HOB elevated, Used rails     General bed mobility comments: increased effort    Transfers Overall transfer level: Needs assistance Equipment used: Rolling walker (2 wheels) Transfers: Sit to/from Stand Sit to Stand: Min assist, +2 physical assistance, +2 safety/equipment           General transfer comment: initial STS from EOB MIN A +2 with pt citing stiffness, improving to MIN A +1 from B [place]      Balance Overall balance assessment: Needs assistance Sitting-balance support: Feet supported, No upper extremity supported Sitting balance-Leahy Scale: Good     Standing balance support: Single extremity supported, Bilateral upper extremity supported, During functional activity Standing balance-Leahy Scale: Fair                             ADL either performed or assessed with clinical judgement   ADL Overall ADL's : Needs assistance/impaired     Grooming: Sitting;Set up               Lower Body Dressing: Sitting/lateral leans;Moderate assistance Lower Body Dressing Details (indicate cue type and reason): donning sock on R Toilet Transfer: BSC/3in1;Rolling walker (2 wheels);Contact guard assist;Minimal assistance   Toileting- Clothing Manipulation and Hygiene: Minimal assistance  Toileting - Clothing Manipulation Details (indicate cue type and reason): MIN A for thoroughness             Vision         Perception         Praxis         Pertinent Vitals/Pain Pain Assessment Pain Assessment:  Faces Faces Pain Scale: Hurts little more Pain Location: RLE Pain Descriptors / Indicators: Aching Pain Intervention(s): Limited activity within patient's tolerance, Monitored during session, Repositioned     Extremity/Trunk Assessment Upper Extremity Assessment Upper Extremity Assessment: Generalized weakness   Lower Extremity Assessment Lower Extremity Assessment: Generalized weakness;Defer to PT evaluation       Communication Communication Communication: No apparent difficulties   Cognition Arousal: Alert Behavior During Therapy: Anxious Cognition: No family/caregiver present to determine baseline             OT - Cognition Comments: oriented x3, reports feeling like no one believes her that she feels fine, appears anxious, apologetic                 Following commands: Intact       Cueing  General Comments   Cueing Techniques: Verbal cues      Exercises     Shoulder Instructions      Home Living Family/patient expects to be discharged to:: Private residence Living Arrangements: Other (Comment);Alone (pt lives by herself in an RV in the driveway of son's property)   Type of Home: Other(Comment) (RV) Home Access: Stairs to enter Entrance Stairs-Number of Steps: a few   Home Layout: One level               Home Equipment: Agricultural consultant (2 wheels);Rollator (4 wheels)          Prior Functioning/Environment Prior Level of Function : Needs assist;History of Falls (last six months)             Mobility Comments: RW, multiple falls recently ADLs Comments: Per son, family assists with meals, cleaning; pt endorses indep with ADL and med mgt    OT Problem List: Decreased strength;Pain;Decreased cognition;Decreased activity tolerance;Impaired balance (sitting and/or standing);Decreased knowledge of use of DME or AE;Obesity   OT Treatment/Interventions: Self-care/ADL training;Therapeutic exercise;Therapeutic activities;DME and/or AE  instruction;Patient/family education;Balance training;Cognitive remediation/compensation      OT Goals(Current goals can be found in the care plan section)   Acute Rehab OT Goals Patient Stated Goal: go home OT Goal Formulation: With patient Time For Goal Achievement: 09/04/23 Potential to Achieve Goals: Good ADL Goals Pt Will Perform Upper Body Dressing: with modified independence;sitting Pt Will Perform Lower Body Dressing: with modified independence;sitting/lateral leans;sit to/from stand Pt Will Transfer to Toilet: with modified independence;ambulating;bedside commode;regular height toilet (LRAD) Pt Will Perform Toileting - Clothing Manipulation and hygiene: with modified independence;sitting/lateral leans;sit to/from stand Additional ADL Goal #1: Pt will complete ADL task/s without need for any VC for safety/sequencing/problem solving, 5/5 opportunities.   OT Frequency:  Min 1X/week    Co-evaluation PT/OT/SLP Co-Evaluation/Treatment: Yes Reason for Co-Treatment: To address functional/ADL transfers;For patient/therapist safety PT goals addressed during session: Mobility/safety with mobility;Balance;Proper use of DME OT goals addressed during session: ADL's and self-care;Proper use of Adaptive equipment and DME      AM-PAC OT "6 Clicks" Daily Activity     Outcome Measure Help from another person eating meals?: None Help from another person taking care of personal grooming?: None Help from another person toileting, which includes using toliet, bedpan, or urinal?: A Little  Help from another person bathing (including washing, rinsing, drying)?: A Little Help from another person to put on and taking off regular upper body clothing?: None Help from another person to put on and taking off regular lower body clothing?: A Little 6 Click Score: 21   End of Session Equipment Utilized During Treatment: Rolling walker (2 wheels) Nurse Communication: Mobility status;Other (comment)  (loose BMs)  Activity Tolerance: Patient tolerated treatment well Patient left: in chair;with call bell/phone within reach;with chair alarm set;with nursing/sitter in room  OT Visit Diagnosis: Unsteadiness on feet (R26.81);Repeated falls (R29.6);Muscle weakness (generalized) (M62.81);Pain Pain - Right/Left: Right Pain - part of body: Leg                Time: 1610-9604 OT Time Calculation (min): 29 min Charges:  OT General Charges $OT Visit: 1 Visit OT Evaluation $OT Eval Low Complexity: 1 Low OT Treatments $Self Care/Home Management : 8-22 mins  Berenda Breaker., MPH, MS, OTR/L ascom [phone] 08/21/23, 12:32 PM

## 2023-08-21 NOTE — Progress Notes (Deleted)
 Subjective:    Patient ID: April Soto, female    DOB: 04/20/1960, 63 y.o.   MRN: [id number]  HPI  Patient presents to clinic today for follow-up of chronic conditions.  HTN: Her BP today is 100/74.  She is taking metoprolol  as prescribed.  ECG from 08/2023 reviewed.  Asthma/COPD: Moderate, persistent. She denies cough or shortness of breath. Managed on Symbicort and Albuterol .  There are no PFTs on file.  She does not follow with pulmonology.  Hypothyroidism: She denies any issues on her current dose of Synthroid .  She does not follow with endocrinology.  Chronic Low Back Pain/OA. Fibromyalgia: Managed with MS Contin , Oxycodone , and Pregabalin .  She has been taking Diclofenac  although it is not on her med list, and would like this refilled today she follows with pain management.  Genital Herpes: She reports recent outbreak.  She takes Acyclovir  as prescribed.  GERD: She is not sure what triggers.  She takes Esomeprazole  only as needed.  There is no upper GI on file.  Chronic Constipation: Managed with Movantik.  There is no colonoscopy on file.  She does not follow with GI.  HLD with Aortic Atherosclerosis: Her last LDL was 69, triglycerides 409, 12/2021.  She denies myalgias on Rosuvastatin  and Lovaza . She is not currently taking Aspiring.  She does not consume low-fat diet.  Depression: Chronic, managed on Venlafaxine , Hydroxyzine  and Geodon  as prescribed.  She is currently seeing a therapist and psychiatrist.  She denies anxiety, SI/HI.  Thrombocytopenia: Her last platelet count was 105, 03/2022.  She does not follow with hematology.  OAB: She reports mainly urgency and urge incontinence.  She is taking Myrbetriq  as prescribed.  She does not follow with urology.  Review of Systems     Past Medical History:  Diagnosis Date   Anemia    "years ago"   Anxiety    takes Ativan  daily   Asthma    Bipolar 1 disorder (HCC)    Chronic back pain    HNP   Constipation    takes  Ra Col Rite daily   Degenerative disc disease    Depression    was on Prozac  but taken off by MD for a couple of weeks   Depression    Fibromyalgia    takes Lyrica  daily   Genital herpes    GERD (gastroesophageal reflux disease)    takes Nexium  daily   Headache(784.0)    Heart murmur    "grew out of"   History of bronchitis    last time 15+yrs ago   History of rheumatic fever    Hyperlipidemia    takes Simvastatin  daily   Hypertension    takes Coreg  daily   Hypothyroidism    takes Synthroid  daily   Joint pain    Muscle spasm    takes Flexeril  daily as needed   Nocturia    Osteoarthritis    Peripheral edema    takes HCTZ daily   Pneumonia    hx of;last time 30+yrs ago   Raynaud's disease    Rheumatic fever    as a child   Seasonal allergies    takes Zyrtec  daily and uses Dymista as well   Stroke [facility])    ? 5 years ago. No lasting deficits   Urinary incontinence    Weakness    nmbness and tingling-right leg    No current facility-administered medications for this visit.   No current outpatient medications on file.   Facility-Administered Medications  Ordered in Other Visits  Medication Dose Route Frequency Provider Last Rate Last Admin   0.9 %  sodium chloride  infusion   Intravenous Continuous Corrinne Din, MD 75 mL/hr at 08/20/23 2119 New Bag at 08/20/23 2119   acetaminophen  (TYLENOL ) tablet 650 mg  650 mg Oral Q6H PRN Corrinne Din, MD   650 mg at 08/21/23 8657   aspirin  EC tablet 81 mg  81 mg Oral Daily Sheril Dines, MD   81 mg at 08/21/23 0818   enoxaparin  (LOVENOX ) injection 55 mg  0.5 mg/kg Subcutaneous Q24H Corrinne Din, MD   55 mg at 08/20/23 2200   levothyroxine  (SYNTHROID ) tablet 88 mcg  88 mcg Oral Q0600 Sheril Dines, MD   88 mcg at 08/21/23 0818   lidocaine  (LIDODERM ) 5 % 1 patch  1 patch Transdermal Once Elisabeth Guild, NP   1 patch at 08/20/23 2205   metoprolol  succinate (TOPROL -XL) 24 hr tablet 50 mg  50 mg Oral QHS Sheril Dines,  MD       ondansetron  (ZOFRAN ) tablet 4 mg  4 mg Oral Q6H PRN Corrinne Din, MD       Or   ondansetron  (ZOFRAN ) injection 4 mg  4 mg Intravenous Q6H PRN Corrinne Din, MD       rosuvastatin  (CRESTOR ) tablet 40 mg  40 mg Oral Daily Sheril Dines, MD   40 mg at 08/21/23 0818   venlafaxine  XR (EFFEXOR -XR) 24 hr capsule 150 mg  150 mg Oral QPM Sheril Dines, MD        Allergies  Allergen Reactions   Levothyroxine  Dermatitis    depression    Family History  Problem Relation Age of Onset   Diabetes type II Mother    Hypertension Mother    Diabetes Mother    Diabetes Sister    Hypertension Sister    Hypertension Maternal Grandmother    Diabetes Maternal Grandmother    Breast cancer Neg Hx     Social History   Socioeconomic History   Marital status: Divorced    Spouse name: Not on file   Number of children: Not on file   Years of education: Not on file   Highest education level: Not on file  Occupational History   Not on file  Tobacco Use   Smoking status: Some Days    Current packs/day: 1.00    Average packs/day: 1 pack/day for 40.0 years (40.0 ttl pk-yrs)    Types: Cigarettes   Smokeless tobacco: Never  Vaping Use   Vaping status: Never Used  Substance and Sexual Activity   Alcohol use: No   Drug use: No   Sexual activity: Not Currently    Birth control/protection: Surgical  Other Topics Concern   Not on file  Social History Narrative   Not on file   Social Drivers of Health   Financial Resource Strain: Low Risk  (08/15/2022)   Overall Financial Resource Strain (CARDIA)    Difficulty of Paying Living Expenses: Not hard at all  Food Insecurity: No Food Insecurity (08/20/2023)   Hunger Vital Sign    Worried About Running Out of Food in the Last Year: Never true    Ran Out of Food in the Last Year: Never true  Transportation Needs: No Transportation Needs (08/15/2022)   PRAPARE - Administrator, Civil Service (Medical): No    Lack of Transportation  (Non-Medical): No  Physical Activity: Inactive (08/15/2022)   Exercise Vital Sign  Days of Exercise per Week: 0 days    Minutes of Exercise per Session: 0 min  Stress: No Stress Concern Present (08/15/2022)   [employer] of Occupational Health - Occupational Stress Questionnaire    Feeling of Stress : Not at all  Social Connections: Socially Isolated (08/15/2022)   Social Connection and Isolation Panel [NHANES]    Frequency of Communication with Friends and Family: More than three times a week    Frequency of Social Gatherings with Friends and Family: More than three times a week    Attends Religious Services: Never    Database administrator or Organizations: No    Attends Banker Meetings: Never    Marital Status: Divorced  Catering manager Violence: Not At Risk (08/20/2023)   Humiliation, Afraid, Rape, and Kick questionnaire    Fear of Current or Ex-Partner: No    Emotionally Abused: No    Physically Abused: No    Sexually Abused: No     Constitutional: Denies fever, malaise, fatigue, headache or abrupt weight changes.  HEENT: Denies eye pain, eye redness, ear pain, ringing in the ears, wax buildup, runny nose, nasal congestion, bloody nose, or sore throat. Respiratory: Denies difficulty breathing, shortness of breath, cough or sputum production.   Cardiovascular: Denies chest pain, chest tightness, palpitations or swelling in the hands or feet.  Gastrointestinal: Patient reports chronic constipation.  Denies abdominal pain, bloating, diarrhea or blood in the stool.  GU: Denies urgency, frequency, pain with urination, burning sensation, blood in urine, odor or discharge. Musculoskeletal: Patient reports chronic joint, muscle pain, difficulty with gait.  Denies decrease in range of motion, or joint swelling.  Skin: Denies redness, rashes, lesions or ulcercations.  Neurological: Denies dizziness, difficulty with memory, difficulty with speech or problems with balance and  coordination.  Psych: Patient has a history of depression.  Denies anxiety, SI/HI.  No other specific complaints in a complete review of systems (except as listed in HPI above).  Objective:   Physical Exam   There were no vitals taken for this visit.  Wt Readings from Last 3 Encounters:  08/20/23 242 lb 15.2 oz (110.2 kg)  02/20/23 214 lb (97.1 kg)  08/15/22 196 lb (88.9 kg)    General: Appears older than her stated age, chronically ill-appearing in NAD. Skin: Warm, dry and intact.  HEENT: Head: normal shape and size; Eyes: sclera white, no icterus, conjunctiva pink, PERRLA and EOMs intact;  Neck:  Neck supple, trachea midline. No masses, lumps or thyromegaly present.  Cardiovascular: Normal rate and rhythm. S1,S2 noted.  No murmur, rubs or gallops noted. No JVD or BLE edema. No carotid bruits noted.  Blue discoloration of bilateral feet without ulceration. Pulmonary/Chest: Normal effort and positive vesicular breath sounds. No respiratory distress. No wheezes, rales or ronchi noted.  Abdomen: Soft and nontender. Normal bowel sounds.  Musculoskeletal: She is using a walker that is too low for her.  Gait slow and steady Neurological: Alert and oriented. Cranial nerves II-XII grossly intact. Coordination normal.  Psychiatric: Mood and affect flat. Behavior is normal. Judgment and thought content normal.     BMET    Component Value Date/Time   NA 138 08/21/2023 0434   NA 140 03/30/2014 2045   K 3.1 (L) 08/21/2023 0434   K 3.7 03/30/2014 2045   CL 103 08/21/2023 0434   CL 106 03/30/2014 2045   CO2 21 (L) 08/21/2023 0434   CO2 28 03/30/2014 2045   GLUCOSE 97 08/21/2023 0434  GLUCOSE 96 03/30/2014 2045   BUN 7 (L) 08/21/2023 0434   BUN 7 03/30/2014 2045   CREATININE 0.42 (L) 08/21/2023 0434   CREATININE 0.69 02/20/2023 1550   CALCIUM  8.1 (L) 08/21/2023 0434   CALCIUM  8.7 03/30/2014 2045   GFRNONAA >60 08/21/2023 0434   GFRNONAA 102 08/08/2020 1124   GFRAA 118 08/08/2020  1124    Lipid Panel     Component Value Date/Time   CHOL 147 02/20/2023 1550   TRIG 122 02/20/2023 1550   HDL 53 02/20/2023 1550   CHOLHDL 2.8 02/20/2023 1550   LDLCALC 74 02/20/2023 1550    CBC    Component Value Date/Time   WBC 9.3 08/21/2023 0434   RBC 4.79 08/21/2023 0434   HGB 14.8 08/21/2023 0434   HGB 15.5 03/30/2014 2045   HCT 43.9 08/21/2023 0434   HCT 47.0 03/30/2014 2045   PLT 140 (L) 08/21/2023 0434   PLT 174 03/30/2014 2045   MCV 91.6 08/21/2023 0434   MCV 92 03/30/2014 2045   MCH 30.9 08/21/2023 0434   MCHC 33.7 08/21/2023 0434   RDW 14.9 08/21/2023 0434   RDW 15.4 (H) 03/30/2014 2045   LYMPHSABS 2.4 08/20/2023 0525   MONOABS 1.1 (H) 08/20/2023 0525   EOSABS 0.1 08/20/2023 0525   BASOSABS 0.0 08/20/2023 0525    Hgb A1C Lab Results  Component Value Date   HGBA1C 5.8 (H) 02/20/2023          Assessment & Plan:     RTC in 6 months for your annual exam Helayne Lo, NP

## 2023-08-21 NOTE — Care Management Obs Status (Signed)
 MEDICARE OBSERVATION STATUS NOTIFICATION   Patient Details  Name: April Soto MRN: [id number] Date of Birth: 04/12/1961   Medicare Observation Status Notification Given:  Rudolph Cost, CMA 08/21/2023, 12:32 PM

## 2023-08-21 NOTE — [place] FL2 (Signed)
 [place]  MEDICAID [id number] LEVEL OF CARE FORM     IDENTIFICATION  Patient Name: April Soto Birthdate: [birth year] Sex: female Admission Date (Current Location): 08/20/2023  [facility] and [place] Number:  Chiropodist and Address:  [facility], [address], [place], [place] [address]      Provider Number: [id number]  Attending Physician Name and Address:  Sheril Dines, MD  Relative Name and Phone Number:  [name] [phone]    Current Level of Care: Hospital Recommended Level of Care: Skilled Nursing Facility Prior Approval Number:    Date Approved/Denied:   PASRR Number:    Discharge Plan: SNF    Current Diagnoses: Patient Active Problem List   Diagnosis Date Noted   Encephalopathy 08/20/2023   OAB (overactive bladder) 07/03/2022   Thrombocytopenia (HCC) 07/03/2022   COPD (chronic obstructive pulmonary disease) (HCC) 03/14/2022   GERD (gastroesophageal reflux disease) 06/13/2021   Chronic constipation 06/13/2021   HLD (hyperlipidemia) 06/13/2021   Genital herpes 06/13/2021   Aortic atherosclerosis (HCC) 06/13/2021   Essential hypertension    MDD (major depressive disorder), recurrent episode, severe (HCC) 08/17/2017   Chronic midline low back pain with right-sided sciatica 04/05/2014   Asthma, chronic    Fibromyalgia 03/11/2014   Class 1 obesity due to excess calories with body mass index (BMI) of 34.0 to 34.9 in adult 03/03/2014   Chronic pain syndrome 03/03/2014   Acquired hypothyroidism 03/02/2014   Osteoarthritis 05/04/2013    Orientation RESPIRATION BLADDER Height & Weight     Self  Normal Continent Weight: 110.2 kg Height:  5\' 6"  (167.6 cm)  BEHAVIORAL SYMPTOMS/MOOD NEUROLOGICAL BOWEL NUTRITION STATUS      Continent Diet (Healthy Carb Modified)  AMBULATORY STATUS COMMUNICATION OF NEEDS Skin   Limited Assist Verbally Normal                       Personal Care Assistance Level of Assistance  Bathing,  Feeding, Dressing Bathing Assistance: Limited assistance Feeding assistance: Limited assistance Dressing Assistance: Limited assistance     Functional Limitations Info             SPECIAL CARE FACTORS FREQUENCY  PT (By licensed PT), OT (By licensed OT)     PT Frequency: 5 X week OT Frequency: 5 X week            Contractures      Additional Factors Info  Code Status, Allergies Code Status Info: FULL Allergies Info: Levothyroxine            Current Medications (08/21/2023):  This is the current hospital active medication list Current Facility-Administered Medications  Medication Dose Route Frequency Provider Last Rate Last Admin   0.9 %  sodium chloride  infusion   Intravenous Continuous Corrinne Din, MD 75 mL/hr at 08/20/23 2119 New Bag at 08/20/23 2119   acetaminophen  (TYLENOL ) tablet 650 mg  650 mg Oral Q6H PRN Newton, Steven J, MD   650 mg at 08/21/23 0657   aspirin  EC tablet 81 mg  81 mg Oral Daily Ayiku, Bernard, MD   81 mg at 08/21/23 0818   enoxaparin  (LOVENOX ) injection 55 mg  0.5 mg/kg Subcutaneous Q24H Corrinne Din, MD   55 mg at 08/20/23 2200   levothyroxine  (SYNTHROID ) tablet 88 mcg  88 mcg Oral Q0600 Sheril Dines, MD   88 mcg at 08/21/23 0818   metoprolol  succinate (TOPROL -XL) 24 hr tablet 50 mg  50 mg Oral QHS Sheril Dines, MD  ondansetron  (ZOFRAN ) tablet 4 mg  4 mg Oral Q6H PRN Corrinne Din, MD       Or   ondansetron  (ZOFRAN ) injection 4 mg  4 mg Intravenous Q6H PRN Newton, Steven J, MD       rosuvastatin  (CRESTOR ) tablet 40 mg  40 mg Oral Daily Ayiku, Bernard, MD   40 mg at 08/21/23 0818   venlafaxine  XR (EFFEXOR -XR) 24 hr capsule 150 mg  150 mg Oral QPM Sheril Dines, MD         Discharge Medications: Please see discharge summary for a list of discharge medications.  Relevant Imaging Results:  Relevant Lab Results:   Additional Information [id number]  Elsie Halo, RN

## 2023-08-21 NOTE — Progress Notes (Signed)
   08/21/23 1200  Spiritual Encounters  Type of Visit Follow up  Care provided to: Pt and family  Referral source Nurse (RN/NT/LPN)  Reason for visit Advance directives  OnCall Visit No  Spiritual Framework  Presenting Themes Impactful experiences and emotions  Patient Stress Factors Family relationships  Interventions  Spiritual Care Interventions Made Established relationship of care and support;Compassionate presence;Encouragement;Mindfulness intervention  Intervention Outcomes  Outcomes Connection to spiritual care  Spiritual Care Plan  Spiritual Care Issues Still Outstanding No further spiritual care needs at this time (see row info)  Advance Directives (For Healthcare)  Does Patient Have a Medical Advance Directive? No  Mental Health Advance Directives  Does Patient Have a Mental Health Advance Directive? No   Chaplain took information about Advance Directives, but the patient decline. The family wanted pt to go through with paperwork. Patient said no thank you. Chaplain encouraged the family to work together for the benefit of the patient.

## 2023-08-22 DIAGNOSIS — G929 Unspecified toxic encephalopathy: Secondary | ICD-10-CM | POA: Diagnosis not present

## 2023-08-22 LAB — VITAMIN B12: Vitamin B-12: 571 pg/mL (ref 180–914)

## 2023-08-22 LAB — BASIC METABOLIC PANEL WITH GFR
Anion gap: 5 (ref 5–15)
BUN: <5 mg/dL — ABNORMAL LOW (ref 8–23)
CO2: 24 mmol/L (ref 22–32)
Calcium: 7.8 mg/dL — ABNORMAL LOW (ref 8.9–10.3)
Chloride: 113 mmol/L — ABNORMAL HIGH (ref 98–111)
Creatinine, Ser: 0.47 mg/dL (ref 0.44–1.00)
GFR, Estimated: >60 mL/min (ref >60)
Glucose, Bld: 90 mg/dL (ref 70–99)
Potassium: 3.1 mmol/L — ABNORMAL LOW (ref 3.5–5.1)
Sodium: 142 mmol/L (ref 135–145)

## 2023-08-22 LAB — RPR: RPR Ser Ql: NONREACTIVE

## 2023-08-22 LAB — MAGNESIUM: Magnesium: 1.9 mg/dL (ref 1.7–2.4)

## 2023-08-22 MED ORDER — POTASSIUM CHLORIDE CRYS ER 20 MEQ PO TBCR
40.0000 meq | EXTENDED_RELEASE_TABLET | Freq: Two times a day (BID) | ORAL | Status: AC
  Administered 2023-08-22 (×2): 40 meq via ORAL
  Filled 2023-08-22 (×2): qty 2

## 2023-08-22 NOTE — Progress Notes (Signed)
 Mobility Specialist - Progress Note  Post-mobility: HR 110, BP 103/86, SPO2 93     08/22/23 1612  Mobility  Activity Ambulated with assistance in room;Stood at bedside  Level of Assistance Contact guard assist, steadying assist  Assistive Device Front wheel walker  Distance Ambulated (ft) 12 ft  Range of Motion/Exercises Active  Activity Response Tolerated well  Mobility Referral Yes  Mobility visit 1 Mobility  Mobility Specialist Start Time (ACUTE ONLY) 1548  Mobility Specialist Stop Time (ACUTE ONLY) 1612  Mobility Specialist Time Calculation (min) (ACUTE ONLY) 24 min   Pt resting in bed on 2L upon entry. Pt STS and ambulates forward toward counter x3 with RW CGA. Pt began losing balance and lowered back onto bed. Pt lifted back into bed +2 due to safety. Pt BP and HR recorded. Pt left with needs in reach. RN notified of session. Bed alarm activated.   Jerri Morale Mobility Specialist 08/22/23, 4:17 PM

## 2023-08-22 NOTE — Progress Notes (Addendum)
 Progress Note    April Soto  ZOX:[id number] DOB: [birth year]  DOA: 08/20/2023 PCP: Carollynn Cirri, NP      Brief Narrative:    Medical records reviewed and are as summarized below:  April Soto is a 63 y.o. female with medical history significant for obesity, chronic back pain on opioids from the pain clinic, bilateral hip osteoarthritis, hypertension, hypothyroidism, anxiety, who was brought to the hospital because of abnormal worsening confusion at home.  She lives in a separate RV from the main house at home.  She has had  worsening agitation and hallucinations.  She was admitted to the hospital for acute confusional state probably due to toxic metabolic encephalopathy from polypharmacy.      Assessment/Plan:   Principal Problem:   Toxic encephalopathy Active Problems:   Essential hypertension   Acquired hypothyroidism   COPD (chronic obstructive pulmonary disease) (HCC)    Body mass index is 39.21 kg/m.  (Obesity)   Altered mental status, suspected acute toxic metabolic encephalopathy: Improved.  Polypharmacy likely contributed to acute change in mental status.  Patient is feeling better.   Check vitamin B12 and RPR levels   Hypokalemia: Potassium is 3.1.  Continue potassium repletion.   Lactic acidosis: Improved.  Etiology is unclear.  No evidence of sepsis or infection thus far.   COPD: Stable.  Bronchodilators as needed Tobacco use disorder: prescribed nicotine  patch.   General Weakness: PT recommended discharge to SNF.  Follow-up with TOC to assist with disposition.    Chronic pain syndrome: Continue analgesics   Comorbidities include history of stroke, hypothyroidism, chronic back pain, hip osteoarthritis, bipolar 1 disorder, anxiety, depression, hypertension, hyperlipidemia, GERD, constipation, Raynaud's disease      Diet Order             Diet heart healthy/carb modified Room service appropriate? Yes; Fluid consistency: Thin   Diet effective now                            Consultants: None  Procedures: None    Medications:    aspirin  EC  81 mg Oral Daily   enoxaparin  (LOVENOX ) injection  0.5 mg/kg Subcutaneous Q24H   levothyroxine   88 mcg Oral Q0600   metoprolol  succinate  50 mg Oral QHS   morphine   15 mg Oral TID   nicotine   21 mg Transdermal Daily   potassium chloride   40 mEq Oral BID   pregabalin   100 mg Oral BID   rosuvastatin   40 mg Oral Daily   venlafaxine  XR  150 mg Oral QPM   ziprasidone   40 mg Oral QPM   Continuous Infusions:     Anti-infectives (From admission, onward)    None              Family Communication/Anticipated D/C date and plan/Code Status   DVT prophylaxis:      Code Status: Full Code  Family Communication: None Disposition Plan: Plan to discharge to SNF   Status is: Inpatient Remains inpatient appropriate because: General weakness.  Awaiting placement to SNF         Subjective:   Interval events noted.  She complains of pain in bilateral lower extremities.  She says she has history of Raynaud's disease.  Objective:    Vitals:   08/21/23 2228 08/21/23 2340 08/22/23 0351 08/22/23 0802  BP:  137/64 (!) 140/62 117/69  Pulse:  (!) 106 93 88  Resp:  18  16  Temp:  97.9 F (36.6 C) 98.5 F (36.9 C) 98.6 F (37 C)  TempSrc:   Axillary   SpO2: 93% 97% 100% 99%  Weight:      Height:       No data found.   Intake/Output Summary (Last 24 hours) at 08/22/2023 1239 Last data filed at 08/22/2023 1100 Gross per 24 hour  Intake 120 ml  Output 2200 ml  Net -2080 ml   Filed Weights   08/20/23 0521  Weight: 110.2 kg    Exam:  GEN: NAD SKIN: Warm and dry.   EYES: No pallor or icterus ENT: MMM CV: RRR PULM: CTA B ABD: soft, obese, NT, +BS CNS: Drowsy but arousable.  Oriented x 4.  Non focal EXT: No edema or tenderness. Purplish discoloration of bilateral toes.  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 Liver Function Tests: Recent Labs  Lab 08/20/23 0525 08/21/23 0434  AST 32 36  ALT 19 21  ALKPHOS 73 57  BILITOT 1.3* 1.4*  PROT 6.6 5.7*  ALBUMIN 3.3* 3.0*   No results for input(s): "LIPASE", "AMYLASE" in the last 168 hours. Recent Labs  Lab 08/20/23 0621  AMMONIA 19   Coagulation profile Recent Labs  Lab 08/20/23 0525  INR 1.2    CBC: Recent Labs  Lab 08/20/23 0525 08/21/23 0434  WBC 10.9* 9.3  NEUTROABS 7.2  --   HGB 16.5* 14.8  HCT 50.9* 43.9  MCV 95.3 91.6  PLT 167 140*   Cardiac Enzymes: No results for input(s): "CKTOTAL", "CKMB", "CKMBINDEX", "TROPONINI" in the last 168 hours. BNP (last 3 results) No results for input(s): "PROBNP" in the last 8760 hours. CBG: Recent Labs  Lab 08/21/23 2223  GLUCAP 113*   D-Dimer: No results for input(s): "DDIMER" in the last 72 hours. Hgb A1c: No results for input(s): "HGBA1C" in the last 72 hours. Lipid Profile: No results for input(s): "CHOL", "HDL", "LDLCALC", "TRIG", "CHOLHDL", "LDLDIRECT" in the last 72 hours. Thyroid  function studies: Recent Labs    08/20/23 0525  TSH 3.020   Anemia work up: No results for input(s): "VITAMINB12", "FOLATE", "FERRITIN", "TIBC", "IRON", "RETICCTPCT" in the last 72 hours. Sepsis Labs: Recent Labs  Lab 08/20/23 0525 08/20/23 1158 08/21/23 0434  WBC 10.9*  --  9.3  LATICACIDVEN 2.2* 1.1  --     Microbiology Recent Results (from the past 240 hours)  Culture, blood  (Routine x 2)     Status: None (Preliminary result)   Collection Time: 08/20/23  5:25 AM   Specimen: BLOOD  Result Value Ref Range Status   Specimen Description BLOOD LEFT AC  Final   Special Requests   Final    BOTTLES DRAWN AEROBIC AND ANAEROBIC Blood Culture results may not be optimal due to an inadequate volume of blood received in  culture bottles   Culture   Final    NO GROWTH 2 DAYS Performed at [facility], [address] [place], [place] [address]    Report Status PENDING  Incomplete  Culture, blood (Routine x 2)     Status: None (Preliminary result)   Collection Time: 08/20/23  5:25 AM   Specimen: BLOOD  Result Value Ref Range Status   Specimen Description BLOOD RIGHT HAND  Final   Special Requests   Final    BOTTLES DRAWN AEROBIC AND ANAEROBIC Blood Culture results may not be optimal due to an inadequate volume of blood received in culture bottles   Culture   Final    NO GROWTH 2 DAYS Performed at [facility], [address] [place], [place] [address]    Report Status PENDING  Incomplete  Resp panel by RT-PCR (RSV, Flu A&B, Covid) Anterior Nasal Swab     Status: None   Collection Time: 08/20/23  6:21 AM   Specimen: Anterior Nasal Swab  Result Value Ref Range Status   SARS Coronavirus 2 by RT PCR NEGATIVE NEGATIVE Final    Comment: (NOTE) SARS-CoV-2 target nucleic acids are NOT DETECTED.  The SARS-CoV-2 RNA is generally detectable in upper respiratory specimens during the acute phase of infection. The lowest concentration of SARS-CoV-2 viral copies this assay can detect is 138 copies/mL. A negative result does not preclude SARS-Cov-2 infection and should not be used as the sole basis for treatment or other patient management decisions. A negative result may occur with  improper specimen collection/handling, submission of specimen other than nasopharyngeal swab, presence of viral mutation(s) within the areas targeted by this assay, and  inadequate number of viral copies(<138 copies/mL). A negative result must be combined with clinical observations, patient history, and epidemiological information. The expected result is Negative.  Fact Sheet for Patients:  BloggerCourse.com  Fact Sheet for Healthcare Providers:  SeriousBroker.it  This test is no t yet approved or cleared by the [place]  FDA and  has been authorized for detection and/or diagnosis of SARS-CoV-2 by FDA under an Emergency Use Authorization (EUA). This EUA will remain  in effect (meaning this test can be used) for the duration of the COVID-19 declaration under Section 564(b)(1) of the Act, 21 U.S.C.section 360bbb-3(b)(1), unless the authorization is terminated  or revoked sooner.       Influenza A by PCR NEGATIVE NEGATIVE Final   Influenza B by PCR NEGATIVE NEGATIVE Final    Comment: (NOTE) The Xpert Xpress SARS-CoV-2/FLU/RSV plus assay is intended as an aid in the diagnosis of influenza from Nasopharyngeal swab specimens and should not be used as a sole basis for treatment. Nasal washings and aspirates are unacceptable for Xpert Xpress SARS-CoV-2/FLU/RSV testing.  Fact Sheet for Patients: BloggerCourse.com  Fact Sheet for Healthcare Providers: SeriousBroker.it  This test is not yet approved or cleared by the [place]  FDA and has been authorized for detection and/or diagnosis of SARS-CoV-2 by FDA under an Emergency Use Authorization (EUA). This EUA will remain in effect (meaning this test can be used) for the duration of the COVID-19 declaration under Section 564(b)(1) of the Act, 21 U.S.C. section 360bbb-3(b)(1), unless the authorization is terminated or revoked.     Resp Syncytial Virus by PCR NEGATIVE NEGATIVE Final    Comment: (NOTE) Fact Sheet for Patients: BloggerCourse.com  Fact Sheet for Healthcare  Providers: SeriousBroker.it  This test is not yet approved or cleared by the [place]  FDA and has been authorized for detection  and/or diagnosis of SARS-CoV-2 by FDA under an Emergency Use Authorization (EUA). This EUA will remain in effect (meaning this test can be used) for the duration of the COVID-19 declaration under Section 564(b)(1) of the Act, 21 U.S.C. section 360bbb-3(b)(1), unless the authorization is terminated or revoked.  Performed at [facility], [address] [place], [place] [address]     Procedures and diagnostic studies:  No results found.              LOS: 1 day   Stella Encarnacion  [facility]   Pager on [email or web address]. If 7PM-7AM, please contact night-coverage at [email or web address]     08/22/2023, 12:39 PM

## 2023-08-23 ENCOUNTER — Inpatient Hospital Stay

## 2023-08-23 DIAGNOSIS — G929 Unspecified toxic encephalopathy: Secondary | ICD-10-CM | POA: Diagnosis not present

## 2023-08-23 LAB — POTASSIUM: Potassium: 4 mmol/L (ref 3.5–5.1)

## 2023-08-23 NOTE — Progress Notes (Signed)
 Progress Note    April Soto  ZOX:[id number] DOB: 09-Mar-1961  DOA: 08/20/2023 PCP: Carollynn Cirri, NP      Brief Narrative:    Medical records reviewed and are as summarized below:  April Soto is a 63 y.o. female with medical history significant for obesity, chronic back pain on opioids from the pain clinic, bilateral hip osteoarthritis, hypertension, hypothyroidism, anxiety, who was brought to the hospital because of abnormal worsening confusion at home.  She lives in a separate RV from the main house at home.  She has had  worsening agitation and hallucinations.  She was admitted to the hospital for acute confusional state probably due to toxic metabolic encephalopathy from polypharmacy.      Assessment/Plan:   Principal Problem:   Toxic encephalopathy Active Problems:   Essential hypertension   Acquired hypothyroidism   COPD (chronic obstructive pulmonary disease) (HCC)    Body mass index is 39.21 kg/m.  (Obesity)   Altered mental status, suspected acute toxic metabolic encephalopathy: Improved.  Polypharmacy likely contributed to acute change in mental status.  Patient is feeling better.   .  Remain B12, TSH and RPR levels were normal   Hypokalemia: Improved   Lactic acidosis: Improved.  Etiology is unclear.  No evidence of sepsis or infection thus far.   COPD: Stable.  Bronchodilators as needed Tobacco use disorder: Continue nicotine  patch   General Weakness: PT recommended discharge to SNF.  Follow-up with TOC to assist with disposition.    Chronic pain syndrome: Continue analgesics Bilateral lower extremity pain, Raynaud's disease: obtain arterial ultrasound to check ABIs   Comorbidities include history of stroke, hypothyroidism, chronic back pain, hip osteoarthritis, bipolar 1 disorder, anxiety, depression, hypertension, hyperlipidemia, GERD, constipation, Raynaud's disease      Diet Order             Diet heart healthy/carb  modified Room service appropriate? Yes; Fluid consistency: Thin  Diet effective now                            Consultants: None  Procedures: None    Medications:    aspirin  EC  81 mg Oral Daily   enoxaparin  (LOVENOX ) injection  0.5 mg/kg Subcutaneous Q24H   levothyroxine   88 mcg Oral Q0600   metoprolol  succinate  50 mg Oral QHS   morphine   15 mg Oral TID   nicotine   21 mg Transdermal Daily   pregabalin   100 mg Oral BID   rosuvastatin   40 mg Oral Daily   venlafaxine  XR  150 mg Oral QPM   ziprasidone   40 mg Oral QPM   Continuous Infusions:     Anti-infectives (From admission, onward)    None              Family Communication/Anticipated D/C date and plan/Code Status   DVT prophylaxis:      Code Status: Full Code  Family Communication: None Disposition Plan: Plan to discharge to SNF   Status is: Inpatient Remains inpatient appropriate because: General weakness.  Awaiting placement to SNF         Subjective:   Interval events noted.  She complains of pain in bilateral lower extremities.  Objective:    Vitals:   08/22/23 2017 08/23/23 0009 08/23/23 0439 08/23/23 0812  BP: 107/69 (!) 92/56 94/62 111/79  Pulse: 98 96 99 (!) 108  Resp: 18 16 14 20   Temp: 98.1 F (36.7  C) 97.7 F (36.5 C) 97.8 F (36.6 C) 98.8 F (37.1 C)  TempSrc: Oral Oral Oral   SpO2: 96% 98% 100% 98%  Weight:      Height:       No data found.   Intake/Output Summary (Last 24 hours) at 08/23/2023 1116 Last data filed at 08/23/2023 0745 Gross per 24 hour  Intake 0 ml  Output 1300 ml  Net -1300 ml   Filed Weights   08/20/23 0521  Weight: 110.2 kg    Exam:  GEN: NAD SKIN: Warm and dry EYES: No pallor or icterus ENT: MMM CV: RRR PULM: CTA B ABD: soft, ND, NT, +BS CNS: AAO x 3, non focal EXT: Dystrophic toenails.  Purplish discoloration of bilateral toes.  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         Data Reviewed:    I have personally reviewed following labs and imaging studies:  Labs: Labs show the following:   Basic Metabolic Panel: Recent Labs  Lab 08/20/23 0525 08/21/23 0434 08/22/23 0418 08/23/23 0357  NA 137 138 142  --   K 4.3 3.1* 3.1* 4.0  CL 100 103 113*  --   CO2 27 21* 24  --   GLUCOSE 106* 97 90  --   BUN 13 7* <5*  --   CREATININE 0.80 0.42* 0.47  --   CALCIUM  9.1 8.1* 7.8*  --   MG  --  1.9 1.9  --   PHOS  --  2.9  --   --    GFR Estimated Creatinine Clearance: 89.4 mL/min (by C-G formula based on SCr of 0.47 mg/dL). Liver Function Tests: Recent Labs  Lab 08/20/23 0525 08/21/23 0434  AST 32 36  ALT 19 21  ALKPHOS 73 57  BILITOT 1.3* 1.4*  PROT 6.6 5.7*  ALBUMIN 3.3* 3.0*   No results for input(s): "LIPASE", "AMYLASE" in the last 168 hours. Recent Labs  Lab 08/20/23 0621  AMMONIA 19   Coagulation profile Recent Labs  Lab 08/20/23 0525  INR 1.2    CBC: Recent Labs  Lab 08/20/23 0525 08/21/23 0434  WBC 10.9* 9.3  NEUTROABS 7.2  --   HGB 16.5* 14.8  HCT 50.9* 43.9  MCV 95.3 91.6  PLT 167 140*   Cardiac Enzymes: No results for input(s): "CKTOTAL", "CKMB", "CKMBINDEX", "TROPONINI" in the last 168 hours. BNP (last 3 results) No results for input(s): "PROBNP" in the last 8760 hours. CBG: Recent Labs  Lab 08/21/23 2223  GLUCAP 113*   D-Dimer: No results for input(s): "DDIMER" in the last 72 hours. Hgb A1c: No results for input(s): "HGBA1C" in the last 72 hours. Lipid Profile: No results for input(s): "CHOL", "HDL", "LDLCALC", "TRIG", "CHOLHDL", "LDLDIRECT" in the last 72 hours. Thyroid  function studies: No results for input(s): "TSH", "T4TOTAL", "T3FREE", "THYROIDAB" in the last 72 hours.  Invalid input(s): "FREET3"  Anemia work up: Recent Labs    08/22/23 0823  VITAMINB12 571   Sepsis Labs: Recent Labs  Lab 08/20/23 0525 08/20/23 1158 08/21/23 0434  WBC 10.9*  --  9.3  LATICACIDVEN 2.2* 1.1  --     Microbiology Recent  Results (from the past 240 hours)  Culture, blood (Routine x 2)     Status: None (Preliminary result)   Collection Time: 08/20/23  5:25 AM   Specimen: BLOOD  Result Value Ref Range Status   Specimen Description BLOOD LEFT [facility]  Final   Special Requests   Final  BOTTLES DRAWN AEROBIC AND ANAEROBIC Blood Culture results may not be optimal due to an inadequate volume of blood received in culture bottles   Culture   Final    NO GROWTH 3 DAYS Performed at [facility], [address] [place], [place] [address]    Report Status PENDING  Incomplete  Culture, blood (Routine x 2)     Status: None (Preliminary result)   Collection Time: 08/20/23  5:25 AM   Specimen: BLOOD  Result Value Ref Range Status   Specimen Description BLOOD RIGHT HAND  Final   Special Requests   Final    BOTTLES DRAWN AEROBIC AND ANAEROBIC Blood Culture results may not be optimal due to an inadequate volume of blood received in culture bottles   Culture   Final    NO GROWTH 3 DAYS Performed at [facility], [address], [place], [place] [address]    Report Status PENDING  Incomplete  Resp panel by RT-PCR (RSV, Flu A&B, Covid) Anterior Nasal Swab     Status: None   Collection Time: 08/20/23  6:21 AM   Specimen: Anterior Nasal Swab  Result Value Ref Range Status   SARS Coronavirus 2 by RT PCR NEGATIVE NEGATIVE Final    Comment: (NOTE) SARS-CoV-2 target nucleic acids are NOT DETECTED.  The SARS-CoV-2 RNA is generally detectable in upper respiratory specimens during the acute phase of infection. The lowest concentration of SARS-CoV-2 viral copies this assay can detect is 138 copies/mL. A negative result does not preclude SARS-Cov-2 infection and should not be used as the sole basis for treatment or other patient management decisions. A negative result may occur with  improper specimen collection/handling, submission of specimen other than nasopharyngeal swab, presence of viral mutation(s)  within the areas targeted by this assay, and inadequate number of viral copies(<138 copies/mL). A negative result must be combined with clinical observations, patient history, and epidemiological information. The expected result is Negative.  Fact Sheet for Patients:  BloggerCourse.com  Fact Sheet for Healthcare Providers:  SeriousBroker.it  This test is no t yet approved or cleared by the [place]  FDA and  has been authorized for detection and/or diagnosis of SARS-CoV-2 by FDA under an Emergency Use Authorization (EUA). This EUA will remain  in effect (meaning this test can be used) for the duration of the COVID-19 declaration under Section 564(b)(1) of the Act, 21 U.S.C.section 360bbb-3(b)(1), unless the authorization is terminated  or revoked sooner.       Influenza A by PCR NEGATIVE NEGATIVE Final   Influenza B by PCR NEGATIVE NEGATIVE Final    Comment: (NOTE) The Xpert Xpress SARS-CoV-2/FLU/RSV plus assay is intended as an aid in the diagnosis of influenza from Nasopharyngeal swab specimens and should not be used as a sole basis for treatment. Nasal washings and aspirates are unacceptable for Xpert Xpress SARS-CoV-2/FLU/RSV testing.  Fact Sheet for Patients: BloggerCourse.com  Fact Sheet for Healthcare Providers: SeriousBroker.it  This test is not yet approved or cleared by the [place]  FDA and has been authorized for detection and/or diagnosis of SARS-CoV-2 by FDA under an Emergency Use Authorization (EUA). This EUA will remain in effect (meaning this test can be used) for the duration of the COVID-19 declaration under Section 564(b)(1) of the Act, 21 U.S.C. section 360bbb-3(b)(1), unless the authorization is terminated or revoked.     Resp Syncytial Virus by PCR NEGATIVE NEGATIVE Final    Comment: (NOTE) Fact Sheet for  Patients: BloggerCourse.com  Fact Sheet for Healthcare Providers:  SeriousBroker.it  This test is not yet approved or cleared by the [place]  FDA and has been authorized for detection and/or diagnosis of SARS-CoV-2 by FDA under an Emergency Use Authorization (EUA). This EUA will remain in effect (meaning this test can be used) for the duration of the COVID-19 declaration under Section 564(b)(1) of the Act, 21 U.S.C. section 360bbb-3(b)(1), unless the authorization is terminated or revoked.  Performed at [facility], [address] [place], [place] [address]     Procedures and diagnostic studies:  No results found.              LOS: 2 days   Chasitie Passey  [facility]   Pager on [email or web address]. If 7PM-7AM, please contact night-coverage at [email or web address]     08/23/2023, 11:16 AM

## 2023-08-24 DIAGNOSIS — G929 Unspecified toxic encephalopathy: Secondary | ICD-10-CM | POA: Diagnosis not present

## 2023-08-24 MED ORDER — POLYETHYLENE GLYCOL 3350 17 G PO PACK
17.0000 g | PACK | Freq: Every day | ORAL | Status: DC
  Administered 2023-08-24 – 2023-08-25 (×2): 17 g via ORAL
  Filled 2023-08-24 (×2): qty 1

## 2023-08-24 MED ORDER — FLUTICASONE FUROATE-VILANTEROL 200-25 MCG/ACT IN AEPB
1.0000 | INHALATION_SPRAY | Freq: Every day | RESPIRATORY_TRACT | Status: DC
  Administered 2023-08-24 – 2023-08-25 (×2): 1 via RESPIRATORY_TRACT
  Filled 2023-08-24: qty 28

## 2023-08-24 MED ORDER — SENNA 8.6 MG PO TABS
1.0000 | ORAL_TABLET | Freq: Every day | ORAL | Status: DC
  Administered 2023-08-24: 8.6 mg via ORAL
  Filled 2023-08-24: qty 1

## 2023-08-24 NOTE — Progress Notes (Signed)
 Occupational Therapy Treatment Patient Details Name: April Soto MRN: [id number] DOB: 11/06/1960 Today's Date: 08/24/2023   History of present illness 63 y.o. female with medical history significant of obesity, chronic back pain, HTN, bipolar disorder, GERD, asthma, fibromyalgia, hypothyroidism, anxiety presenting with encephalopathy.   OT comments  Pt in bed upon OT arrival and agreeable to transition up to chair.  Pt verbalized some anxiety with her COPD and baseline SOB with activity, but appeared calmed with review of EC strategies and PLB and reinforcement to make slow transitions/allow rest breaks as needed.  Gown change and peri care completed d/t purewick leak (see below for pt performance).  Oral care set up provided once pt was seated up in chair.  02 sats ranged from 88-91 at rest and with activity; quickly increased to 91% with guided PLB after 1-2 breaths.  Will continue to follow to work towards goals in OT poc.       If plan is discharge home, recommend the following:  A little help with walking and/or transfers;A little help with bathing/dressing/bathroom;Assistance with cooking/housework;Assist for transportation;Help with stairs or ramp for entrance;Direct supervision/assist for medications management   Equipment Recommendations  Other (comment) (defer to next venue of care)    Recommendations for Other Services      Precautions / Restrictions Precautions Precautions: Fall Recall of Precautions/Restrictions: Intact Restrictions Weight Bearing Restrictions Per Provider Order: No       Mobility Bed Mobility Overal bed mobility: Needs Assistance Bed Mobility: Supine to Sit     Supine to sit: Supervision, HOB elevated, Used rails     General bed mobility comments: increased effort Patient Response: Cooperative, Anxious  Transfers Overall transfer level: Needs assistance Equipment used: Rolling walker (2 wheels) Transfers: Sit to/from Stand Sit to Stand:  Min assist           General transfer comment: increased time d/t LE pain/back pain     Balance Overall balance assessment: Needs assistance Sitting-balance support: Feet supported, No upper extremity supported Sitting balance-Leahy Scale: Good     Standing balance support: Single extremity supported, During functional activity, Reliant on assistive device for balance Standing balance-Leahy Scale: Fair Standing balance comment: min guard to perform peri care in standing with 1 hand on RW for support                           ADL either performed or assessed with clinical judgement   ADL Overall ADL's : Needs assistance/impaired     Grooming: Oral care;Sitting           Upper Body Dressing : Set up;Sitting Upper Body Dressing Details (indicate cue type and reason): gown change d/t being soiled from urine         Toileting- Clothing Manipulation and Hygiene: Contact guard assist;Sit to/from stand Toileting - Clothing Manipulation Details (indicate cue type and reason): min guard for balance and set up of washcloth for frontal peri hygiene d/t purewick leak     Functional mobility during ADLs: Rolling walker (2 wheels);Contact guard assist General ADL Comments: Pt declined ambulation to bathroom d/t reporting baseline mobility is very limited d/t her residing in a camper, but agreeable to transfer up to chair.    Extremity/Trunk Assessment Upper Extremity Assessment Upper Extremity Assessment: Generalized weakness   Lower Extremity Assessment Lower Extremity Assessment: Generalized weakness        Vision  Communication Communication Communication: No apparent difficulties   Cognition Arousal: Alert Behavior During Therapy: WFL for tasks assessed/performed               OT - Cognition Comments: Pt verbalized anxiety about SOB with activity at baseline, but very cooperative.  Reviewed EC and PLB.                  Following commands: Intact        Cueing   Cueing Techniques: Verbal cues  Exercises Other Exercises Other Exercises: Reviewed EC strategies and PLB to limit SOB with activity/increase tolerance to ADLs    Shoulder Instructions       General Comments      Pertinent Vitals/ Pain       Pain Assessment Pain Score: 5  Pain Location: low back Pain Descriptors / Indicators: Aching Pain Intervention(s): Limited activity within patient's tolerance, Monitored during session, Repositioned  Home Living                                                        Frequency  Min 1X/week        Progress Toward Goals  OT Goals(current goals can now be found in the care plan section)  Progress towards OT goals: Progressing toward goals  Acute Rehab OT Goals Patient Stated Goal: go home OT Goal Formulation: With patient Time For Goal Achievement: 09/04/23 Potential to Achieve Goals: Good  Plan      Co-evaluation                 AM-PAC OT "6 Clicks" Daily Activity     Outcome Measure   Help from another person eating meals?: None Help from another person taking care of personal grooming?: A Little Help from another person toileting, which includes using toliet, bedpan, or urinal?: A Little Help from another person bathing (including washing, rinsing, drying)?: A Little Help from another person to put on and taking off regular upper body clothing?: None Help from another person to put on and taking off regular lower body clothing?: A Little 6 Click Score: 20    End of Session Equipment Utilized During Treatment: Rolling walker (2 wheels)  OT Visit Diagnosis: Unsteadiness on feet (R26.81);Repeated falls (R29.6);Muscle weakness (generalized) (M62.81);Pain Pain - Right/Left: Left Pain - part of body: Ankle and joints of foot   Activity Tolerance Patient tolerated treatment well   Patient Left in chair;with call bell/phone within reach;with  chair alarm set   Nurse Communication Mobility status;Other (comment) (Purewick discarded into trash d/t being soiled)        Time: 5784-6962 OT Time Calculation (min): 31 min  Charges: OT General Charges $OT Visit: 1 Visit OT Treatments $Self Care/Home Management : 23-37 mins  Marcus Sewer, MS, OTR/L   Casandra Claw 08/24/2023, 4:05 PM

## 2023-08-24 NOTE — Care Management Important Message (Signed)
 Important Message  Patient Details  Name: April Soto MRN: [id number] Date of Birth: 07/15/1960   Important Message Given:  Yes - Medicare IM     Alquan Morrish W, CMA 08/24/2023, 11:42 AM

## 2023-08-24 NOTE — TOC Progression Note (Signed)
 Transition of Care [facility]) - Progression Note    Patient Details  Name: LIERIN LISKA MRN: [id number] Date of Birth: 05-05-1960  [facility] CM/SW Contact  Crayton Docker, RN Phone Number: 08/24/2023, 4:37 PM  Clinical Narrative:     CM to patient's room regarding SNF preferences. Per patient prefers [facility]. CM secure message to Gena, Admissions, [facility] regarding initiating auth. Secure message from Gena, for CM to initiate auth. CM alert to Nitchia P, CMA regarding initiating auth.    Expected Discharge Plan and Services    SNF    Social Determinants of Health (SDOH) Interventions SDOH Screenings   Food Insecurity: No Food Insecurity (08/20/2023)  Housing: Unknown (08/20/2023)  Transportation Needs: Unmet Transportation Needs (08/21/2023)  Utilities: Not At Risk (08/15/2022)  Alcohol Screen: Low Risk  (02/20/2023)  Depression (PHQ2-9): High Risk (02/20/2023)  Financial Resource Strain: Low Risk  (08/15/2022)  Physical Activity: Inactive (08/15/2022)  Social Connections: Socially Isolated (08/15/2022)  Stress: No Stress Concern Present (08/15/2022)  Tobacco Use: High Risk (08/20/2023)    Readmission Risk Interventions     No data to display

## 2023-08-24 NOTE — Plan of Care (Signed)

## 2023-08-24 NOTE — Progress Notes (Signed)
 Progress Note    MERIDYTH SCHADE  ZOX:[id number] DOB: [birth year]  DOA: 08/20/2023 PCP: Carollynn Cirri, NP      Brief Narrative:    Medical records reviewed and are as summarized below:  April Soto is a 63 y.o. female with medical history significant for obesity, chronic back pain on opioids from the pain clinic, bilateral hip osteoarthritis, hypertension, hypothyroidism, anxiety, who was brought to the hospital because of abnormal worsening confusion at home.  She lives in a separate RV from the main house at home.  She has had  worsening agitation and hallucinations.  She was admitted to the hospital for acute confusional state probably due to toxic metabolic encephalopathy from polypharmacy.      Assessment/Plan:   Principal Problem:   Toxic encephalopathy Active Problems:   Essential hypertension   Acquired hypothyroidism   COPD (chronic obstructive pulmonary disease) (HCC)    Body mass index is 39.21 kg/m.  (Obesity)   Altered mental status, suspected acute toxic metabolic encephalopathy: Improved.  Polypharmacy likely contributed to acute change in mental status.  It appears she is at her her baseline. Vitamin B12, TSH and RPR levels were normal   Hypokalemia: Improved   Lactic acidosis: Improved.  Etiology is unclear.  No evidence of sepsis or infection thus far.   COPD: Stable.  Bronchodilators as needed Tobacco use disorder: Continue nicotine  patch   General Weakness: PT recommended discharge to SNF.  Follow-up with TOC to assist with disposition.    Chronic pain syndrome: Continue analgesics Bilateral lower extremity pain, Raynaud's disease: Arterial ultrasound of bilateral lower extremities showed normal ABIs.   Comorbidities include history of stroke, hypothyroidism, chronic back pain, hip osteoarthritis, bipolar 1 disorder, anxiety, depression, hypertension, hyperlipidemia, GERD, constipation, Raynaud's disease      Diet Order              Diet heart healthy/carb modified Room service appropriate? Yes; Fluid consistency: Thin  Diet effective now                            Consultants: None  Procedures: None    Medications:    aspirin  EC  81 mg Oral Daily   enoxaparin  (LOVENOX ) injection  0.5 mg/kg Subcutaneous Q24H   fluticasone  furoate-vilanterol  1 puff Inhalation Daily   levothyroxine   88 mcg Oral Q0600   metoprolol  succinate  50 mg Oral QHS   morphine   15 mg Oral TID   nicotine   21 mg Transdermal Daily   polyethylene glycol  17 g Oral Daily   pregabalin   100 mg Oral BID   rosuvastatin   40 mg Oral Daily   senna  1 tablet Oral QHS   venlafaxine  XR  150 mg Oral QPM   ziprasidone   40 mg Oral QPM   Continuous Infusions:     Anti-infectives (From admission, onward)    None              Family Communication/Anticipated D/C date and plan/Code Status   DVT prophylaxis:      Code Status: Full Code  Family Communication: None Disposition Plan: Plan to discharge to SNF   Status is: Inpatient Remains inpatient appropriate because: General weakness.  Awaiting placement to SNF         Subjective:   No acute events overnight.  She has no complaints.  Objective:    Vitals:   08/23/23 2021 08/24/23 0446 08/24/23 0730 08/24/23  0945  BP: 123/77 103/63 95/82 121/76  Pulse: (!) 109 83 84 89  Resp: 16 14    Temp: 98 F (36.7 C) (!) 97.3 F (36.3 C) 98.6 F (37 C)   TempSrc: Oral  Oral   SpO2: 92% 94% 92% 92%  Weight:      Height:       No data found.   Intake/Output Summary (Last 24 hours) at 08/24/2023 1126 Last data filed at 08/24/2023 1056 Gross per 24 hour  Intake 240 ml  Output 800 ml  Net -560 ml   Filed Weights   08/20/23 0521  Weight: 110.2 kg    Exam:  GEN: NAD SKIN: Warm and dry EYES: No pallor or icterus ENT: MMM CV: RRR PULM: CTA B ABD: soft, obese, NT, +BS CNS: AAO x 3, non focal EXT: Chronic purplish discoloration of  bilateral toes.  Dystrophic toenails.       Data Reviewed:   I have personally reviewed following labs and imaging studies:  Labs: Labs show the following:   Basic Metabolic Panel: Recent Labs  Lab 08/20/23 0525 08/21/23 0434 08/22/23 0418 08/23/23 0357  NA 137 138 142  --   K 4.3 3.1* 3.1* 4.0  CL 100 103 113*  --   CO2 27 21* 24  --   GLUCOSE 106* 97 90  --   BUN 13 7* <5*  --   CREATININE 0.80 0.42* 0.47  --   CALCIUM  9.1 8.1* 7.8*  --   MG  --  1.9 1.9  --   PHOS  --  2.9  --   --    GFR Estimated Creatinine Clearance: 89.4 mL/min (by C-G formula based on SCr of 0.47 mg/dL). Liver Function Tests: Recent Labs  Lab 08/20/23 0525 08/21/23 0434  AST 32 36  ALT 19 21  ALKPHOS 73 57  BILITOT 1.3* 1.4*  PROT 6.6 5.7*  ALBUMIN 3.3* 3.0*   No results for input(s): "LIPASE", "AMYLASE" in the last 168 hours. Recent Labs  Lab 08/20/23 0621  AMMONIA 19   Coagulation profile Recent Labs  Lab 08/20/23 0525  INR 1.2    CBC: Recent Labs  Lab 08/20/23 0525 08/21/23 0434  WBC 10.9* 9.3  NEUTROABS 7.2  --   HGB 16.5* 14.8  HCT 50.9* 43.9  MCV 95.3 91.6  PLT 167 140*   Cardiac Enzymes: No results for input(s): "CKTOTAL", "CKMB", "CKMBINDEX", "TROPONINI" in the last 168 hours. BNP (last 3 results) No results for input(s): "PROBNP" in the last 8760 hours. CBG: Recent Labs  Lab 08/21/23 2223  GLUCAP 113*   D-Dimer: No results for input(s): "DDIMER" in the last 72 hours. Hgb A1c: No results for input(s): "HGBA1C" in the last 72 hours. Lipid Profile: No results for input(s): "CHOL", "HDL", "LDLCALC", "TRIG", "CHOLHDL", "LDLDIRECT" in the last 72 hours. Thyroid  function studies: No results for input(s): "TSH", "T4TOTAL", "T3FREE", "THYROIDAB" in the last 72 hours.  Invalid input(s): "FREET3"  Anemia work up: Recent Labs    08/22/23 0823  VITAMINB12 571   Sepsis Labs: Recent Labs  Lab 08/20/23 0525 08/20/23 1158 08/21/23 0434  WBC 10.9*   --  9.3  LATICACIDVEN 2.2* 1.1  --     Microbiology Recent Results (from the past 240 hours)  Culture, blood (Routine x 2)     Status: None (Preliminary result)   Collection Time: 08/20/23  5:25 AM   Specimen: BLOOD  Result Value Ref Range Status   Specimen Description BLOOD LEFT  [facility]  Final   Special Requests   Final    BOTTLES DRAWN AEROBIC AND ANAEROBIC Blood Culture results may not be optimal due to an inadequate volume of blood received in culture bottles   Culture   Final    NO GROWTH 4 DAYS Performed at [facility], [address] [place], [place] [address]    Report Status PENDING  Incomplete  Culture, blood (Routine x 2)     Status: None (Preliminary result)   Collection Time: 08/20/23  5:25 AM   Specimen: BLOOD  Result Value Ref Range Status   Specimen Description BLOOD RIGHT HAND  Final   Special Requests   Final    BOTTLES DRAWN AEROBIC AND ANAEROBIC Blood Culture results may not be optimal due to an inadequate volume of blood received in culture bottles   Culture   Final    NO GROWTH 4 DAYS Performed at [facility], [address] [place], [place] [address]    Report Status PENDING  Incomplete  Resp panel by RT-PCR (RSV, Flu A&B, Covid) Anterior Nasal Swab     Status: None   Collection Time: 08/20/23  6:21 AM   Specimen: Anterior Nasal Swab  Result Value Ref Range Status   SARS Coronavirus 2 by RT PCR NEGATIVE NEGATIVE Final    Comment: (NOTE) SARS-CoV-2 target nucleic acids are NOT DETECTED.  The SARS-CoV-2 RNA is generally detectable in upper respiratory specimens during the acute phase of infection. The lowest concentration of SARS-CoV-2 viral copies this assay can detect is 138 copies/mL. A negative result does not preclude SARS-Cov-2 infection and should not be used as the sole basis for treatment or other patient management decisions. A negative result may occur with  improper specimen collection/handling, submission of specimen  other than nasopharyngeal swab, presence of viral mutation(s) within the areas targeted by this assay, and inadequate number of viral copies(<138 copies/mL). A negative result must be combined with clinical observations, patient history, and epidemiological information. The expected result is Negative.  Fact Sheet for Patients:  BloggerCourse.com  Fact Sheet for Healthcare Providers:  SeriousBroker.it  This test is no t yet approved or cleared by the [place]  FDA and  has been authorized for detection and/or diagnosis of SARS-CoV-2 by FDA under an Emergency Use Authorization (EUA). This EUA will remain  in effect (meaning this test can be used) for the duration of the COVID-19 declaration under Section 564(b)(1) of the Act, 21 U.S.C.section 360bbb-3(b)(1), unless the authorization is terminated  or revoked sooner.       Influenza A by PCR NEGATIVE NEGATIVE Final   Influenza B by PCR NEGATIVE NEGATIVE Final    Comment: (NOTE) The Xpert Xpress SARS-CoV-2/FLU/RSV plus assay is intended as an aid in the diagnosis of influenza from Nasopharyngeal swab specimens and should not be used as a sole basis for treatment. Nasal washings and aspirates are unacceptable for Xpert Xpress SARS-CoV-2/FLU/RSV testing.  Fact Sheet for Patients: BloggerCourse.com  Fact Sheet for Healthcare Providers: SeriousBroker.it  This test is not yet approved or cleared by the [place]  FDA and has been authorized for detection and/or diagnosis of SARS-CoV-2 by FDA under an Emergency Use Authorization (EUA). This EUA will remain in effect (meaning this test can be used) for the duration of the COVID-19 declaration under Section 564(b)(1) of the Act, 21 U.S.C. section 360bbb-3(b)(1), unless the authorization is terminated or revoked.     Resp Syncytial Virus by PCR NEGATIVE NEGATIVE Final  Comment: (NOTE) Fact Sheet for Patients: BloggerCourse.com  Fact Sheet for Healthcare Providers: SeriousBroker.it  This test is not yet approved or cleared by the [place]  FDA and has been authorized for detection and/or diagnosis of SARS-CoV-2 by FDA under an Emergency Use Authorization (EUA). This EUA will remain in effect (meaning this test can be used) for the duration of the COVID-19 declaration under Section 564(b)(1) of the Act, 21 U.S.C. section 360bbb-3(b)(1), unless the authorization is terminated or revoked.  Performed at [facility], [address] [place], [place] [address]     Procedures and diagnostic studies:  US  ARTERIAL ABI (SCREENING LOWER EXTREMITY) Result Date: 08/23/2023 CLINICAL DATA:  Bilateral toe pain and discoloration. History of Raynaud's syndrome. History of hypertension and hyperlipidemia. EXAM: NONINVASIVE PHYSIOLOGIC VASCULAR STUDY OF BILATERAL LOWER EXTREMITIES TECHNIQUE: Evaluation of both lower extremities were performed at rest, including calculation of ankle-brachial indices with single level Doppler, pressure and pulse volume recording. COMPARISON:  None Available. FINDINGS: Right ABI:  1.12 Left ABI:  1.07 Right Lower Extremity: Normal triphasic posterior tibial and dorsalis pedis waveforms at the ankle. Left Lower Extremity: Normal triphasic posterior tibial and dorsalis pedis waveforms at the ankle. 1.0-1.4 Normal IMPRESSION: Normal study demonstrating normal resting ankle-brachial indices bilaterally and normal distal waveforms bilaterally. Electronically Signed   By: Erica Hau M.D.   On: 08/23/2023 15:26                LOS: 3 days   Huntley Knoop  [facility]   Pager on [email or web address]. If 7PM-7AM, please contact night-coverage at [email or web address]     08/24/2023, 11:26 AM

## 2023-08-25 ENCOUNTER — Other Ambulatory Visit: Payer: Self-pay | Admitting: Internal Medicine

## 2023-08-25 DIAGNOSIS — K649 Unspecified hemorrhoids: Secondary | ICD-10-CM | POA: Diagnosis not present

## 2023-08-25 DIAGNOSIS — E038 Other specified hypothyroidism: Secondary | ICD-10-CM | POA: Diagnosis not present

## 2023-08-25 DIAGNOSIS — M545 Low back pain, unspecified: Secondary | ICD-10-CM | POA: Diagnosis not present

## 2023-08-25 DIAGNOSIS — K219 Gastro-esophageal reflux disease without esophagitis: Secondary | ICD-10-CM | POA: Diagnosis not present

## 2023-08-25 DIAGNOSIS — G928 Other toxic encephalopathy: Secondary | ICD-10-CM | POA: Diagnosis not present

## 2023-08-25 DIAGNOSIS — Z743 Need for continuous supervision: Secondary | ICD-10-CM | POA: Diagnosis not present

## 2023-08-25 DIAGNOSIS — I10 Essential (primary) hypertension: Secondary | ICD-10-CM | POA: Diagnosis not present

## 2023-08-25 DIAGNOSIS — F17200 Nicotine dependence, unspecified, uncomplicated: Secondary | ICD-10-CM | POA: Diagnosis not present

## 2023-08-25 DIAGNOSIS — Z736 Limitation of activities due to disability: Secondary | ICD-10-CM | POA: Diagnosis not present

## 2023-08-25 DIAGNOSIS — J302 Other seasonal allergic rhinitis: Secondary | ICD-10-CM | POA: Diagnosis not present

## 2023-08-25 DIAGNOSIS — G9349 Other encephalopathy: Secondary | ICD-10-CM | POA: Diagnosis not present

## 2023-08-25 DIAGNOSIS — G929 Unspecified toxic encephalopathy: Secondary | ICD-10-CM | POA: Diagnosis not present

## 2023-08-25 DIAGNOSIS — E785 Hyperlipidemia, unspecified: Secondary | ICD-10-CM | POA: Diagnosis not present

## 2023-08-25 DIAGNOSIS — K5909 Other constipation: Secondary | ICD-10-CM | POA: Diagnosis not present

## 2023-08-25 DIAGNOSIS — M6259 Muscle wasting and atrophy, not elsewhere classified, multiple sites: Secondary | ICD-10-CM | POA: Diagnosis not present

## 2023-08-25 DIAGNOSIS — R5383 Other fatigue: Secondary | ICD-10-CM | POA: Diagnosis not present

## 2023-08-25 DIAGNOSIS — R2681 Unsteadiness on feet: Secondary | ICD-10-CM | POA: Diagnosis not present

## 2023-08-25 DIAGNOSIS — R110 Nausea: Secondary | ICD-10-CM | POA: Diagnosis not present

## 2023-08-25 DIAGNOSIS — J301 Allergic rhinitis due to pollen: Secondary | ICD-10-CM | POA: Diagnosis not present

## 2023-08-25 DIAGNOSIS — Z299 Encounter for prophylactic measures, unspecified: Secondary | ICD-10-CM | POA: Diagnosis not present

## 2023-08-25 DIAGNOSIS — J449 Chronic obstructive pulmonary disease, unspecified: Secondary | ICD-10-CM | POA: Diagnosis not present

## 2023-08-25 DIAGNOSIS — E039 Hypothyroidism, unspecified: Secondary | ICD-10-CM | POA: Diagnosis not present

## 2023-08-25 LAB — CULTURE, BLOOD (ROUTINE X 2)
Culture: NO GROWTH
Culture: NO GROWTH

## 2023-08-25 MED ORDER — OXYCODONE HCL 10 MG PO TABS: 10.0000 mg | ORAL_TABLET | Freq: Four times a day (QID) | ORAL | 0 refills | Status: DC | PRN

## 2023-08-25 MED ORDER — BUDESONIDE-FORMOTEROL FUMARATE 160-4.5 MCG/ACT IN AERO
2.0000 | INHALATION_SPRAY | Freq: Every day | RESPIRATORY_TRACT | Status: AC
Start: 2023-08-25 — End: ?

## 2023-08-25 MED ORDER — ACYCLOVIR 200 MG PO CAPS
400.0000 mg | ORAL_CAPSULE | Freq: Every day | ORAL | Status: DC
  Administered 2023-08-25: 400 mg via ORAL
  Filled 2023-08-25: qty 2

## 2023-08-25 MED ORDER — ACETAMINOPHEN 325 MG PO TABS: 650.0000 mg | ORAL_TABLET | Freq: Four times a day (QID) | ORAL | Status: AC | PRN

## 2023-08-25 MED ORDER — MORPHINE SULFATE ER 15 MG PO TBCR: 15.0000 mg | EXTENDED_RELEASE_TABLET | Freq: Three times a day (TID) | ORAL | 0 refills | Status: DC

## 2023-08-25 MED ORDER — NICOTINE 21 MG/24HR TD PT24: 21.0000 mg | MEDICATED_PATCH | Freq: Every day | TRANSDERMAL | Status: AC

## 2023-08-25 MED ORDER — DICLOFENAC SODIUM 75 MG PO TBEC: 75.0000 mg | DELAYED_RELEASE_TABLET | Freq: Two times a day (BID) | ORAL | Status: AC | PRN

## 2023-08-25 NOTE — Plan of Care (Signed)

## 2023-08-25 NOTE — Discharge Summary (Signed)
 Physician Discharge Summary   Patient: April [facility] MRN: [id number] DOB: [birth year]  Admit date:     08/20/2023  Discharge date: 08/25/23  Discharge Physician: Sheril Dines   PCP: Carollynn Cirri, NP   Recommendations at discharge:   Follow-up with physician in the next home within 3 days of discharge  Discharge Diagnoses: Principal Problem:   Toxic encephalopathy Active Problems:   Essential hypertension   Acquired hypothyroidism   COPD (chronic obstructive pulmonary disease) (HCC)  Resolved Problems:   Sepsis April [facility])  [facility] Course:  ERLA ROLFE is a 63 y.o. female with medical history significant for obesity, chronic back pain on opioids from the pain clinic, bilateral hip osteoarthritis, hypertension, hypothyroidism, anxiety, who was brought to the [facility] because of abnormal worsening confusion at home.  She lives in a separate RV from the main house at home.  She has had  worsening agitation and hallucinations.   She was admitted to the [facility] for acute confusional state probably due to toxic metabolic encephalopathy from polypharmacy.    Assessment and Plan:   Altered mental status, suspected acute toxic metabolic encephalopathy: Improved.  Polypharmacy likely contributed to acute change in mental status.  It appears she is at her her baseline. Vitamin B12, TSH and RPR levels were normal     Hypokalemia: Improved     Lactic acidosis: Improved.  Etiology is unclear.  No evidence of sepsis or infection thus far.     COPD: Stable.  Bronchodilators as needed Tobacco use disorder: Continue nicotine  patch     General Weakness: PT recommended discharge to SNF.  Follow-up with TOC to assist with disposition.      Chronic pain syndrome: Continue analgesics Bilateral lower extremity pain, Raynaud's disease: Arterial ultrasound of bilateral lower extremities showed normal ABIs.     Comorbidities include history of stroke, hypothyroidism, chronic back pain,  hip osteoarthritis, bipolar 1 disorder, anxiety, depression, hypertension, hyperlipidemia, GERD, constipation, Raynaud's disease            Pain control - [place]  Controlled Substance Reporting System database was reviewed. and patient was instructed, not to drive, operate heavy machinery, perform activities at heights, swimming or participation in water activities or provide baby-sitting services while on Pain, Sleep and Anxiety Medications; until their outpatient Physician has advised to do so again. Also recommended to not to take more than prescribed Pain, Sleep and Anxiety Medications.  Consultants: None Procedures performed: None Disposition: Home Diet recommendation:  Discharge Diet Orders (From admission, onward)     Start     Ordered   08/25/23 0000  Diet - low sodium heart healthy        08/25/23 1305           Cardiac diet DISCHARGE MEDICATION: Allergies as of 08/25/2023       Reactions   Levothyroxine  Dermatitis   depression        Medication List     STOP taking these medications    cyclobenzaprine  10 MG tablet Commonly known as: FLEXERIL    hydrOXYzine  25 MG tablet Commonly known as: ATARAX    Myrbetriq  50 MG Tb24 tablet Generic drug: mirabegron  ER       TAKE these medications    acetaminophen  325 MG tablet Commonly known as: TYLENOL  Take 2 tablets (650 mg total) by mouth every 6 (six) hours as needed for mild pain (pain score 1-3), moderate pain (pain score 4-6), fever or headache (or Fever >/= 101).   acyclovir  400 MG tablet Commonly  known as: ZOVIRAX  TAKE 1 TABLET BY MOUTH DAILY   albuterol  108 (90 Base) MCG/ACT inhaler Commonly known as: VENTOLIN  HFA Inhale 2 puffs into the lungs every 4 (four) hours as needed for wheezing or shortness of breath.   aspirin  EC 81 MG tablet Take 1 tablet (81 mg total) by mouth daily. Swallow whole.   budesonide-formoterol  160-4.5 MCG/ACT inhaler Commonly known as: Symbicort Inhale 2 puffs  into the lungs daily. What changed: See the new instructions.   cetirizine  10 MG tablet Commonly known as: ZYRTEC  Take 1 tablet (10 mg total) by mouth daily.   diclofenac  75 MG EC tablet Commonly known as: VOLTAREN  Take 1 tablet (75 mg total) by mouth 2 (two) times daily as needed for moderate pain (pain score 4-6). What changed:  when to take this reasons to take this   esomeprazole  40 MG capsule Commonly known as: NEXIUM  TAKE ONE CAPSULE (40 MG TOTAL) BY MOUTH DAILY.   fluticasone  50 MCG/ACT nasal spray Commonly known as: FLONASE  INSTILL ONE SPRAY INTO EACH NOSTRIL DAILY AS NEEDED FOR SEASONAL ALLERGY SYMPTOMS   hydrocortisone  2.5 % rectal cream Commonly known as: ANUSOL -HC Apply 1 application topically as needed for hemorrhoids.   metoprolol  succinate 50 MG 24 hr tablet Commonly known as: TOPROL -XL TAKE 1 TABLET BY MOUTH EVERY NIGHT AT   BEDTIME   morphine  15 MG 12 hr tablet Commonly known as: MS CONTIN  Take 1 tablet (15 mg total) by mouth 3 (three) times daily.   Movantik 25 MG Tabs tablet Generic drug: naloxegol oxalate Take 25 mg by mouth daily.   nicotine  21 mg/24hr patch Commonly known as: NICODERM CQ  - dosed in mg/24 hours Place 1 patch (21 mg total) onto the skin daily. Start taking on: Aug 26, 2023   Olopatadine  HCl 0.2 % Soln Place 1 drop into both eyes at bedtime as needed (allergies).   omega-3 acid ethyl esters 1 g capsule Commonly known as: LOVAZA  TAKE 2 CAPSULES BY MOUTH TWICE A DAY   ondansetron  4 MG disintegrating tablet Commonly known as: ZOFRAN -ODT Take 1 tablet (4 mg total) by mouth every 8 (eight) hours as needed for nausea or vomiting.   Oxycodone  HCl 10 MG Tabs Take 1 tablet (10 mg total) by mouth every 6 (six) hours as needed. What changed: when to take this   pregabalin  100 MG capsule Commonly known as: LYRICA  Take 100 mg by mouth 3 (three) times daily.   rosuvastatin  40 MG tablet Commonly known as: CRESTOR  TAKE 1 TABLET BY  MOUTH ONCE A DAY   Synthroid  88 MCG tablet Generic drug: levothyroxine  TAKE 1 TABLET BY MOUTH ONCE A DAY BEFORE BREAKFAST   venlafaxine  XR 150 MG 24 hr capsule Commonly known as: EFFEXOR -XR Take 1 capsule (150 mg total) by mouth every evening. For mood control   ziprasidone  40 MG capsule Commonly known as: GEODON  TAKE 1 CAPSULE BY MOUTH EVERY EVENING WITH FOOD FOR MOOD        Contact information for follow-up providers     Carollynn Cirri, NP Follow up.   Specialties: Internal Medicine, Emergency Medicine Why: [facility] follow up Contact information: [address] [place] [address] [phone]              Contact information for after-discharge care     Destination     [facility] Preferred SNF .   Service: Skilled Nursing Contact information: [address] [place]  [phone]  Discharge Exam: Filed Weights   08/20/23 0521  Weight: 110.2 kg   GEN: NAD SKIN: Warm and dry EYES: No pallor or icterus ENT: MMM CV: RRR PULM: CTA B ABD: soft, obese, NT, +BS CNS: AAO x 3, non focal EXT: No edema or tenderness   Condition at discharge: good  The results of significant diagnostics from this hospitalization (including imaging, microbiology, ancillary and laboratory) are listed below for reference.   Imaging Studies: US  ARTERIAL ABI (SCREENING LOWER EXTREMITY) Result Date: 08/23/2023 CLINICAL DATA:  Bilateral toe pain and discoloration. History of Raynaud's syndrome. History of hypertension and hyperlipidemia. EXAM: NONINVASIVE PHYSIOLOGIC VASCULAR STUDY OF BILATERAL LOWER EXTREMITIES TECHNIQUE: Evaluation of both lower extremities were performed at rest, including calculation of ankle-brachial indices with single level Doppler, pressure and pulse volume recording. COMPARISON:  None Available. FINDINGS: Right ABI:  1.12 Left ABI:  1.07 Right Lower Extremity: Normal triphasic  posterior tibial and dorsalis pedis waveforms at the ankle. Left Lower Extremity: Normal triphasic posterior tibial and dorsalis pedis waveforms at the ankle. 1.0-1.4 Normal IMPRESSION: Normal study demonstrating normal resting ankle-brachial indices bilaterally and normal distal waveforms bilaterally. Electronically Signed   By: Erica Hau M.D.   On: 08/23/2023 15:26   CT PELVIS WO CONTRAST Result Date: 08/20/2023 CLINICAL DATA:  Hip trauma, fracture suspected EXAM: CT PELVIS WITHOUT CONTRAST TECHNIQUE: Multidetector CT imaging of the pelvis was performed following the standard protocol without intravenous contrast. RADIATION DOSE REDUCTION: This exam was performed according to the departmental dose-optimization program which includes automated exposure control, adjustment of the mA and/or kV according to patient size and/or use of iterative reconstruction technique. COMPARISON:  Pelvis radiography from earlier today FINDINGS: No acute fracture or dislocation. Degenerative spurring with hip joint narrowing on the right more than left, severe on the right. L4-5 solid arthrodesis. Lumbosacral disc degeneration. No evidence of pelvic ring fracture or diastasis. No acute soft tissue finding. IMPRESSION: No acute finding. Right more than left hip osteoarthritis, particularly severe on the right. Electronically Signed   By: Ronnette Coke M.D.   On: 08/20/2023 07:55   DG Hip Unilat With Pelvis 2-3 Views Left Result Date: 08/20/2023 CLINICAL DATA:  Several falls in last 2 days.  Left hip pain. EXAM: DG HIP (WITH OR WITHOUT PELVIS) 2-3V LEFT COMPARISON:  None Available. FINDINGS: There is no evidence of hip fracture or dislocation. Moderate left hip osteoarthritis is seen. Severe right hip osteoarthritis is also demonstrated. Generalized osteopenia noted. Fusion hardware noted in lower lumbar spine. IMPRESSION: No acute findings. Moderate left and severe right hip osteoarthritis. Electronically Signed   By: Marlyce Sine M.D.   On: 08/20/2023 06:11   CT Head Wo Contrast Result Date: 08/20/2023 CLINICAL DATA:  Neck trauma, intoxication or obtain ended. Multiple falls over 2 days. Code sepsis EXAM: CT HEAD WITHOUT CONTRAST CT CERVICAL SPINE WITHOUT CONTRAST TECHNIQUE: Multidetector CT imaging of the head and cervical spine was performed following the standard protocol without intravenous contrast. Multiplanar CT image reconstructions of the cervical spine were also generated. RADIATION DOSE REDUCTION: This exam was performed according to the departmental dose-optimization program which includes automated exposure control, adjustment of the mA and/or kV according to patient size and/or use of iterative reconstruction technique. COMPARISON:  None Available. FINDINGS: CT HEAD FINDINGS Brain: No evidence of acute infarction, hemorrhage, hydrocephalus, extra-axial collection or mass lesion/mass effect. Moderately extensive chronic small vessel ischemia in the cerebral white matter. Mild cerebral volume loss. Vascular: No hyperdense vessel or unexpected calcification. Skull: Normal.  Negative for fracture or focal lesion. Sinuses/Orbits: No acute finding CT CERVICAL SPINE FINDINGS Alignment: No traumatic malalignment Skull base and vertebrae: No acute fracture. No primary bone lesion or focal pathologic process. Soft tissues and spinal canal: No prevertebral fluid or swelling. No visible canal hematoma. Disc levels:  Generalized degenerative endplate spurring. Upper chest: No evidence of injury IMPRESSION: No evidence of acute intracranial or cervical spine injury. Electronically Signed   By: Ronnette Coke M.D.   On: 08/20/2023 06:10   CT Cervical Spine Wo Contrast Result Date: 08/20/2023 CLINICAL DATA:  Neck trauma, intoxication or obtain ended. Multiple falls over 2 days. Code sepsis EXAM: CT HEAD WITHOUT CONTRAST CT CERVICAL SPINE WITHOUT CONTRAST TECHNIQUE: Multidetector CT imaging of the head and cervical spine was  performed following the standard protocol without intravenous contrast. Multiplanar CT image reconstructions of the cervical spine were also generated. RADIATION DOSE REDUCTION: This exam was performed according to the departmental dose-optimization program which includes automated exposure control, adjustment of the mA and/or kV according to patient size and/or use of iterative reconstruction technique. COMPARISON:  None Available. FINDINGS: CT HEAD FINDINGS Brain: No evidence of acute infarction, hemorrhage, hydrocephalus, extra-axial collection or mass lesion/mass effect. Moderately extensive chronic small vessel ischemia in the cerebral white matter. Mild cerebral volume loss. Vascular: No hyperdense vessel or unexpected calcification. Skull: Normal. Negative for fracture or focal lesion. Sinuses/Orbits: No acute finding CT CERVICAL SPINE FINDINGS Alignment: No traumatic malalignment Skull base and vertebrae: No acute fracture. No primary bone lesion or focal pathologic process. Soft tissues and spinal canal: No prevertebral fluid or swelling. No visible canal hematoma. Disc levels:  Generalized degenerative endplate spurring. Upper chest: No evidence of injury IMPRESSION: No evidence of acute intracranial or cervical spine injury. Electronically Signed   By: Ronnette Coke M.D.   On: 08/20/2023 06:10   DG Chest Port 1 View Result Date: 08/20/2023 CLINICAL DATA:  Shortness of breath.  Hypoxia.  Possible sepsis EXAM: PORTABLE CHEST 1 VIEW COMPARISON:  03/14/2022 FINDINGS: The heart size and mediastinal contours are within normal limits. Both lungs are clear. The visualized skeletal structures are unremarkable. IMPRESSION: No active disease. Electronically Signed   By: Marlyce Sine M.D.   On: 08/20/2023 06:09    Microbiology: Results for orders placed or performed during the [facility] encounter of 08/20/23  Culture, blood (Routine x 2)     Status: None   Collection Time: 08/20/23  5:25 AM   Specimen:  BLOOD  Result Value Ref Range Status   Specimen Description BLOOD LEFT AC  Final   Special Requests   Final    BOTTLES DRAWN AEROBIC AND ANAEROBIC Blood Culture results may not be optimal due to an inadequate volume of blood received in culture bottles   Culture   Final    NO GROWTH 5 DAYS Performed at [facility], [address] [place], [place] [address]    Report Status 08/25/2023 FINAL  Final  Culture, blood (Routine x 2)     Status: None   Collection Time: 08/20/23  5:25 AM   Specimen: BLOOD  Result Value Ref Range Status   Specimen Description BLOOD RIGHT HAND  Final   Special Requests   Final    BOTTLES DRAWN AEROBIC AND ANAEROBIC Blood Culture results may not be optimal due to an inadequate volume of blood received in culture bottles   Culture   Final    NO GROWTH 5 DAYS Performed at [facility], [address]  [place], [place] [address]    Report Status 08/25/2023 FINAL  Final  Resp panel by RT-PCR (RSV, Flu A&B, Covid) Anterior Nasal Swab     Status: None   Collection Time: 08/20/23  6:21 AM   Specimen: Anterior Nasal Swab  Result Value Ref Range Status   SARS Coronavirus 2 by RT PCR NEGATIVE NEGATIVE Final    Comment: (NOTE) SARS-CoV-2 target nucleic acids are NOT DETECTED.  The SARS-CoV-2 RNA is generally detectable in upper respiratory specimens during the acute phase of infection. The lowest concentration of SARS-CoV-2 viral copies this assay can detect is 138 copies/mL. A negative result does not preclude SARS-Cov-2 infection and should not be used as the sole basis for treatment or other patient management decisions. A negative result may occur with  improper specimen collection/handling, submission of specimen other than nasopharyngeal swab, presence of viral mutation(s) within the areas targeted by this assay, and inadequate number of viral copies(<138 copies/mL). A negative result must be combined with clinical observations, patient  history, and epidemiological information. The expected result is Negative.  Fact Sheet for Patients:  BloggerCourse.com  Fact Sheet for Healthcare Providers:  SeriousBroker.it  This test is no t yet approved or cleared by the [place]  FDA and  has been authorized for detection and/or diagnosis of SARS-CoV-2 by FDA under an Emergency Use Authorization (EUA). This EUA will remain  in effect (meaning this test can be used) for the duration of the COVID-19 declaration under Section 564(b)(1) of the Act, 21 U.S.C.section 360bbb-3(b)(1), unless the authorization is terminated  or revoked sooner.       Influenza A by PCR NEGATIVE NEGATIVE Final   Influenza B by PCR NEGATIVE NEGATIVE Final    Comment: (NOTE) The Xpert Xpress SARS-CoV-2/FLU/RSV plus assay is intended as an aid in the diagnosis of influenza from Nasopharyngeal swab specimens and should not be used as a sole basis for treatment. Nasal washings and aspirates are unacceptable for Xpert Xpress SARS-CoV-2/FLU/RSV testing.  Fact Sheet for Patients: BloggerCourse.com  Fact Sheet for Healthcare Providers: SeriousBroker.it  This test is not yet approved or cleared by the [place]  FDA and has been authorized for detection and/or diagnosis of SARS-CoV-2 by FDA under an Emergency Use Authorization (EUA). This EUA will remain in effect (meaning this test can be used) for the duration of the COVID-19 declaration under Section 564(b)(1) of the Act, 21 U.S.C. section 360bbb-3(b)(1), unless the authorization is terminated or revoked.     Resp Syncytial Virus by PCR NEGATIVE NEGATIVE Final    Comment: (NOTE) Fact Sheet for Patients: BloggerCourse.com  Fact Sheet for Healthcare Providers: SeriousBroker.it  This test is not yet approved or cleared by the [place]  FDA  and has been authorized for detection and/or diagnosis of SARS-CoV-2 by FDA under an Emergency Use Authorization (EUA). This EUA will remain in effect (meaning this test can be used) for the duration of the COVID-19 declaration under Section 564(b)(1) of the Act, 21 U.S.C. section 360bbb-3(b)(1), unless the authorization is terminated or revoked.  Performed at [facility], [address] [place], [place] [address]     Labs: CBC: Recent Labs  Lab 08/20/23 0525 08/21/23 0434  WBC 10.9* 9.3  NEUTROABS 7.2  --   HGB 16.5* 14.8  HCT 50.9* 43.9  MCV 95.3 91.6  PLT 167 140*   Basic Metabolic Panel: Recent Labs  Lab 08/20/23 0525 08/21/23 0434 08/22/23 0418 08/23/23 0357  NA 137 138 142  --   K 4.3 3.1*  3.1* 4.0  CL 100 103 113*  --   CO2 27 21* 24  --   GLUCOSE 106* 97 90  --   BUN 13 7* <5*  --   CREATININE 0.80 0.42* 0.47  --   CALCIUM  9.1 8.1* 7.8*  --   MG  --  1.9 1.9  --   PHOS  --  2.9  --   --    Liver Function Tests: Recent Labs  Lab 08/20/23 0525 08/21/23 0434  AST 32 36  ALT 19 21  ALKPHOS 73 57  BILITOT 1.3* 1.4*  PROT 6.6 5.7*  ALBUMIN 3.3* 3.0*   CBG: Recent Labs  Lab 08/21/23 2223  GLUCAP 113*    Discharge time spent: greater than 30 minutes.  Signed: Sheril Dines, MD [facility] 08/25/2023

## 2023-08-25 NOTE — TOC Progression Note (Addendum)
 Transition of Care [facility]) - Progression Note    Patient Details  Name: April [facility] MRN: [id number] Date of Birth: 23-Sep-1960  [facility] CM/SW Contact  Crayton Docker, RN 08/25/2023, 9:58 AM  Clinical Narrative:      Alert received from Delane Fear, CMA regarding auth status, auth Pending Ref: [id number].  CM will follow up .   CM to call to patient's son, [name], phone: [phone] regarding SNF placement, and auth status pending. CM to patient's room regarding confirmation of demographic information. Patient states demographic information is correct. CM call to  [facility] regarding patient's demographic information. CM spoke to Anesta. Per Kyung Pi, patient has extra space noted in name, updated. Per Anesta, patient has a PASSR. PASSR is [id number] A 12/22/2013. CM secure message to Gena, regarding PASSR and pending auth status.    Alert received from Nitchia P, CMA regarding auth status.Approved PlanAuthID:[id number] Dates:5/13-5/15/25 Next Review Date: 08/27/2023.    CM call to [facility] regarding BLS transport scheduling. CM spoke to [place]. BLS transport scheduled for 1400.   Expected Discharge Plan and Services    SNF    Social Determinants of Health (SDOH) Interventions SDOH Screenings   Food Insecurity: No Food Insecurity (08/20/2023)  Housing: Unknown (08/20/2023)  Transportation Needs: Unmet Transportation Needs (08/21/2023)  Utilities: Not At Risk (08/15/2022)  Alcohol Screen: Low Risk  (02/20/2023)  Depression (PHQ2-9): High Risk (02/20/2023)  Financial Resource Strain: Low Risk  (08/15/2022)  Physical Activity: Inactive (08/15/2022)  Social Connections: Socially Isolated (08/15/2022)  Stress: No Stress Concern Present (08/15/2022)  Tobacco Use: High Risk (08/20/2023)    Readmission Risk Interventions     No data to display

## 2023-08-25 NOTE — Progress Notes (Incomplete)
 {  [facility]_TRH_Note:[id number]}

## 2023-08-25 NOTE — TOC Transition Note (Signed)
 Transition of Care [facility]) - Discharge Note   Patient Details  Name: DEAMBER WANZER MRN: [id number] Date of Birth: 1960/08/15  [facility]) CM/SW Contact:  Crayton Docker, RN 08/25/2023, 2:11 PM   Clinical Narrative:     Discharge orders, discharge summary noted for SNF. [facility] scheduled for 1400 today. RN report number and SNF room number provided to RN Merilyn.  Final next level of care: Skilled Nursing Facility Barriers to Discharge: No Barriers Identified   Patient Goals and CMS Choice    SNF   Discharge Placement               SNF  Discharge Plan and Services Additional resources added to the After Visit Summary for      SNF             Social Drivers of Health (SDOH) Interventions SDOH Screenings   Food Insecurity: No Food Insecurity (08/20/2023)  Housing: Unknown (08/20/2023)  Transportation Needs: Unmet Transportation Needs (08/21/2023)  Utilities: Not At Risk (08/15/2022)  Alcohol Screen: Low Risk  (02/20/2023)  Depression (PHQ2-9): High Risk (02/20/2023)  Financial Resource Strain: Low Risk  (08/15/2022)  Physical Activity: Inactive (08/15/2022)  Social Connections: Socially Isolated (08/15/2022)  Stress: No Stress Concern Present (08/15/2022)  Tobacco Use: High Risk (08/20/2023)     Readmission Risk Interventions     No data to display

## 2023-08-25 NOTE — Progress Notes (Signed)
 Physical Therapy Treatment Patient Details Name: April Soto MRN: [id number] DOB: Feb 25, 1961 Today's Date: 08/25/2023   History of Present Illness 63 y.o. female with medical history significant of obesity, chronic back pain, HTN, bipolar disorder, GERD, asthma, fibromyalgia, hypothyroidism, anxiety presenting with encephalopathy.    PT Comments  Pt resting in bed, easily awakes to name, agrees to OOB activity. Pt able to transfer to EOB with Supervision, use of rail and HOB raised, Mod/MinA from flat bed and increased time. Sit to stand with MinA to RW. Short distance gait in room with RW, CGA and use of BSC. Pt fatigues quickly due to weakness and prolonged hospital stay. Will benefit from STR as pt was independent at home ambulating without a device. Supportive family nearby to help. Continue PT per POC.    If plan is discharge home, recommend the following: A little help with walking and/or transfers;A little help with bathing/dressing/bathroom;Assistance with cooking/housework;Assist for transportation;Help with stairs or ramp for entrance   Can travel by private vehicle     Yes  Equipment Recommendations  Other (comment) (TBD)    Recommendations for Other Services       Precautions / Restrictions Precautions Precautions: Fall Recall of Precautions/Restrictions: Intact Restrictions Weight Bearing Restrictions Per Provider Order: No     Mobility  Bed Mobility Overal bed mobility: Needs Assistance Bed Mobility: Supine to Sit     Supine to sit: Supervision, HOB elevated, Used rails     General bed mobility comments: increased effort    Transfers Overall transfer level: Needs assistance Equipment used: Rolling walker (2 wheels) Transfers: Sit to/from Stand Sit to Stand: Min assist           General transfer comment: Min/ModA to raise from flat bed without rails    Ambulation/Gait Ambulation/Gait assistance: Contact guard assist, Min assist Gait Distance  (Feet): 10 Feet Assistive device: Rolling walker (2 wheels) Gait Pattern/deviations: Step-to pattern, Decreased stride length Gait velocity: decr     General Gait Details: short distance gait bed>BSC>chair with RW, pt quick to fatigue   Stairs             Wheelchair Mobility     Tilt Bed    Modified Rankin (Stroke Patients Only)       Balance Overall balance assessment: Needs assistance Sitting-balance support: Feet supported, No upper extremity supported Sitting balance-Leahy Scale: Good     Standing balance support: Bilateral upper extremity supported, During functional activity, Reliant on assistive device for balance Standing balance-Leahy Scale: Fair                              Hotel manager: No apparent difficulties  Cognition Arousal: Alert Behavior During Therapy: WFL for tasks assessed/performed   PT - Cognitive impairments: No apparent impairments                         Following commands: Intact      Cueing Cueing Techniques: Verbal cues  Exercises      General Comments General comments (skin integrity, edema, etc.): Pt's toes on each foot are slightly red/inflammed, L foot warmer to touch than R. Pt states she has Raynauds with chronic B foot pain      Pertinent Vitals/Pain Pain Assessment Pain Assessment: No/denies pain    Home Living  Prior Function            PT Goals (current goals can now be found in the care plan section) Acute Rehab PT Goals Patient Stated Goal: to go home Progress towards PT goals: Progressing toward goals    Frequency    Min 2X/week      PT Plan      Co-evaluation              AM-PAC PT "6 Clicks" Mobility   Outcome Measure  Help needed turning from your back to your side while in a flat bed without using bedrails?: A Little Help needed moving from lying on your back to sitting on the side of a flat  bed without using bedrails?: A Little Help needed moving to and from a bed to a chair (including a wheelchair)?: A Little Help needed standing up from a chair using your arms (e.g., wheelchair or bedside chair)?: A Little Help needed to walk in hospital room?: A Little Help needed climbing 3-5 steps with a railing? : A Lot 6 Click Score: 17    End of Session Equipment Utilized During Treatment: Gait belt Activity Tolerance: Patient limited by fatigue Patient left: in chair;with call bell/phone within reach Nurse Communication: Mobility status PT Visit Diagnosis: Other abnormalities of gait and mobility (R26.89);Difficulty in walking, not elsewhere classified (R26.2);Muscle weakness (generalized) (M62.81)     Time: 1100-1120 PT Time Calculation (min) (ACUTE ONLY): 20 min  Charges:    $Therapeutic Activity: 8-22 mins PT General Charges $$ ACUTE PT VISIT: 1 Visit                    Melvyn Stagers, PTA  Diona Franklin 08/25/2023, 12:55 PM

## 2023-08-26 DIAGNOSIS — K219 Gastro-esophageal reflux disease without esophagitis: Secondary | ICD-10-CM | POA: Diagnosis not present

## 2023-08-27 ENCOUNTER — Other Ambulatory Visit: Payer: Self-pay | Admitting: Internal Medicine

## 2023-08-27 DIAGNOSIS — G9349 Other encephalopathy: Secondary | ICD-10-CM | POA: Diagnosis not present

## 2023-08-27 DIAGNOSIS — K219 Gastro-esophageal reflux disease without esophagitis: Secondary | ICD-10-CM

## 2023-08-27 DIAGNOSIS — J449 Chronic obstructive pulmonary disease, unspecified: Secondary | ICD-10-CM | POA: Diagnosis not present

## 2023-08-27 DIAGNOSIS — I10 Essential (primary) hypertension: Secondary | ICD-10-CM | POA: Diagnosis not present

## 2023-08-27 NOTE — Telephone Encounter (Signed)
 Requested medications are due for refill today.  unsure  Requested medications are on the active medications list.  yes  Last refill. 08/25/2023 - went as "no print "  Future visit scheduled.   no  Notes to clinic.  Rx was signed by Dr. Leory Rands.    Requested Prescriptions  Pending Prescriptions Disp Refills   SYMBICORT 160-4.5 MCG/ACT inhaler [Pharmacy Med Name: SYMBICORT 160-4.5 AEROSOL] 10.2 g 2    Sig: INHALE TWO PUFFS BY MOUTH TWICE DAILY (MAINTENANCE INHALER). RINSE WITH WELL AFTER USE TO PREVENT THRUSH     Pulmonology:  Combination Products Failed - 08/27/2023 11:34 AM      Failed - Valid encounter within last 12 months    Recent Outpatient Visits   None

## 2023-08-28 NOTE — Telephone Encounter (Signed)
 OV 02/20/23 Rx 07/31/23 #90 Requested Prescriptions  Pending Prescriptions Disp Refills   esomeprazole  (NEXIUM ) 40 MG capsule [Pharmacy Med Name: ESOMEPRAZOLE  MAGNESIUM  40MG  DR CAPSULE DR] 30 capsule 0    Sig: TAKE ONE CAPSULE (40 MG TOTAL) BY MOUTH DAILY. EMERGENCY FILL     Gastroenterology: Proton Pump Inhibitors 2 Failed - 08/28/2023  2:49 PM      Failed - Valid encounter within last 12 months    Recent Outpatient Visits   None            Passed - ALT in normal range and within 360 days    ALT  Date Value Ref Range Status  08/21/2023 21 0 - 44 U/L Final   SGPT (ALT)  Date Value Ref Range Status  03/30/2014 20 U/L Final    Comment:    14-63 NOTE: New Reference Range 11/01/13          Passed - AST in normal range and within 360 days    AST  Date Value Ref Range Status  08/21/2023 36 15 - 41 U/L Final   SGOT(AST)  Date Value Ref Range Status  03/30/2014 18 15 - 37 Unit/L Final

## 2023-08-30 DIAGNOSIS — M545 Low back pain, unspecified: Secondary | ICD-10-CM | POA: Diagnosis not present

## 2023-08-31 DIAGNOSIS — I10 Essential (primary) hypertension: Secondary | ICD-10-CM | POA: Diagnosis not present

## 2023-08-31 DIAGNOSIS — M545 Low back pain, unspecified: Secondary | ICD-10-CM | POA: Diagnosis not present

## 2023-09-03 DIAGNOSIS — J449 Chronic obstructive pulmonary disease, unspecified: Secondary | ICD-10-CM | POA: Diagnosis not present

## 2023-09-03 DIAGNOSIS — G9349 Other encephalopathy: Secondary | ICD-10-CM | POA: Diagnosis not present

## 2023-09-03 DIAGNOSIS — I10 Essential (primary) hypertension: Secondary | ICD-10-CM | POA: Diagnosis not present

## 2023-09-03 DIAGNOSIS — E038 Other specified hypothyroidism: Secondary | ICD-10-CM | POA: Diagnosis not present

## 2023-09-30 DIAGNOSIS — Z79899 Other long term (current) drug therapy: Secondary | ICD-10-CM | POA: Diagnosis not present

## 2023-09-30 DIAGNOSIS — K5903 Drug induced constipation: Secondary | ICD-10-CM | POA: Diagnosis not present

## 2023-09-30 DIAGNOSIS — F17210 Nicotine dependence, cigarettes, uncomplicated: Secondary | ICD-10-CM | POA: Diagnosis not present

## 2023-09-30 DIAGNOSIS — M542 Cervicalgia: Secondary | ICD-10-CM | POA: Diagnosis not present

## 2023-09-30 DIAGNOSIS — G894 Chronic pain syndrome: Secondary | ICD-10-CM | POA: Diagnosis not present

## 2023-09-30 DIAGNOSIS — Z79891 Long term (current) use of opiate analgesic: Secondary | ICD-10-CM | POA: Diagnosis not present

## 2023-09-30 DIAGNOSIS — M5416 Radiculopathy, lumbar region: Secondary | ICD-10-CM | POA: Diagnosis not present

## 2023-11-11 ENCOUNTER — Ambulatory Visit: Payer: Self-pay

## 2023-11-11 NOTE — Telephone Encounter (Signed)
 Patient and son called after receiving PAD results from insurance co via mail. Reports no new or worsening of symptoms. Hospital f/u scheduled.  Answer Assessment - Initial Assessment Questions Son concerned regarding ED PAD test result. Reports years hx of R leg numbness.  1. SYMPTOM: What is the main symptom you are concerned about? (e.g., weakness, numbness)     R leg numbness x years, not new onset  2. ONSET: When did this start? (e.g., minutes, hours, days; while sleeping)     Years  3. LAST NORMAL: When was the last time you (the patient) were normal (no symptoms)?     Years 4. PATTERN Does this come and go, or has it been constant since it started?  Is it present now?     *No Answer* 5. CARDIAC SYMPTOMS: Have you had any of the following symptoms: chest pain, difficulty breathing, palpitations?     *No Answer* 6. NEUROLOGIC SYMPTOMS: Have you had any of the following symptoms: headache, dizziness, vision loss, double vision, changes in speech, unsteady on your feet?     *No Answer* 7. OTHER SYMPTOMS: Do you have any other symptoms?     *No Answer* 8. PREGNANCY: Is there any chance you are pregnant? When was your last menstrual period?     *No Answer*  Answer Assessment - Initial Assessment Questions Hospital f/u scheduled with PCP. ED precautions reviewed, pt verbalized understanding.   1. MAIN CONCERN OR SYMPTOM:  What is your main concern right now? What question do you have? What's the main symptom you're worried about? (e.g., breathing difficulty, ankle swelling, weight gain.)     PAD results from 08/23/23  2. ONSET:      Several years of right leg numbness  3. BETTER-SAME-WORSE: Are you getting better, staying the same, or getting worse compared to the day you were discharged?     Same  4. HOSPITALIZATION: How long were you hospitalized? (e.g., days)     See ED note and PAD results from May  Protocols used: Neurologic Deficit-A-AH,  Post-Hospitalization Follow-up Call-A-AH Copied from CRM [id number]. Topic: Clinical - Red Word Triage >> Nov 11, 2023  2:37 PM Tobias L wrote: Red Word that prompted transfer to Nurse Triage: Received letter that one of her tests was abnormal, PAD for her legs. Test results show that patient has 1.11 normal on left side and right side is 0.14 severe. Letter was sent to PCP & patient needs to follow up about treating it.   Having trouble walking on right side, possible blockage, patient has numbness and pain in right leg

## 2023-11-12 ENCOUNTER — Ambulatory Visit: Admitting: Internal Medicine

## 2023-11-12 NOTE — Telephone Encounter (Signed)
 Will discuss at upcoming appointment

## 2023-11-17 ENCOUNTER — Ambulatory Visit: Admitting: Internal Medicine

## 2023-11-17 NOTE — Progress Notes (Deleted)
 Subjective:    Patient ID: April Soto, female    DOB: 1960/12/19, 63 y.o.   MRN: [id number]  HPI  Patient presents to clinic today for hospital follow-up.  She presented to the ER 5/8 with complaint of altered mental status with agitation and hallucinations.  Initial labs revealed a WBC count of 10.9, chronic polycythemia with a H&H of 16.5/50.9.  Her lactic acid was 2.2 although this resolved with IV fluids and no source of sepsis was found.  Her ammonia was WNL.  Her blood cultures were negative.  Urinalysis was not concerning for infection and no urine culture was sent.  CT of the head and cervical spine did not show any evidence of acute findings.  Chest x-ray was normal.  X-ray of the right hip showed severe osteoarthritis confirmed by CT of the pelvis.  ABIs were normal.  She was diagnosed with toxic encephalopathy secondary to polypharmacy.  They discontinued her flexeril , hydroxyzine  and myrbetriq .  She was discharged on 5/13 to a skilled nursing facility.  Since that time.   Review of Systems     Past Medical History:  Diagnosis Date   Anemia    years ago   Anxiety    takes Ativan  daily   Asthma    Bipolar 1 disorder (HCC)    Chronic back pain    HNP   Constipation    takes Ra Col Rite daily   Degenerative disc disease    Depression    was on Prozac  but taken off by MD for a couple of weeks   Depression    Fibromyalgia    takes Lyrica  daily   Genital herpes    GERD (gastroesophageal reflux disease)    takes Nexium  daily   Headache(784.0)    Heart murmur    grew out of   History of bronchitis    last time 15+yrs ago   History of rheumatic fever    Hyperlipidemia    takes Simvastatin  daily   Hypertension    takes Coreg  daily   Hypothyroidism    takes Synthroid  daily   Joint pain    Muscle spasm    takes Flexeril  daily as needed   Nocturia    Osteoarthritis    Peripheral edema    takes HCTZ daily   Pneumonia    hx of;last time 30+yrs ago    Raynaud's disease    Rheumatic fever    as a child   Seasonal allergies    takes Zyrtec  daily and uses Dymista as well   Stroke [facility])    ? 5 years ago. No lasting deficits   Urinary incontinence    Weakness    nmbness and tingling-right leg    Current Outpatient Medications  Medication Sig Dispense Refill   acetaminophen  (TYLENOL ) 325 MG tablet Take 2 tablets (650 mg total) by mouth every 6 (six) hours as needed for mild pain (pain score 1-3), moderate pain (pain score 4-6), fever or headache (or Fever >/= 101).     acyclovir  (ZOVIRAX ) 400 MG tablet TAKE 1 TABLET BY MOUTH DAILY 90 tablet 2   albuterol  (VENTOLIN  HFA) 108 (90 Base) MCG/ACT inhaler Inhale 2 puffs into the lungs every 4 (four) hours as needed for wheezing or shortness of breath.      aspirin  EC 81 MG tablet Take 1 tablet (81 mg total) by mouth daily. Swallow whole. 30 tablet 12   budesonide -formoterol  (SYMBICORT ) 160-4.5 MCG/ACT inhaler Inhale 2 puffs into the lungs daily.  cetirizine  (ZYRTEC ) 10 MG tablet Take 1 tablet (10 mg total) by mouth daily. 90 tablet 1   diclofenac  (VOLTAREN ) 75 MG EC tablet Take 1 tablet (75 mg total) by mouth 2 (two) times daily as needed for moderate pain (pain score 4-6).     esomeprazole  (NEXIUM ) 40 MG capsule TAKE ONE CAPSULE (40 MG TOTAL) BY MOUTH DAILY. 90 capsule 0   fluticasone  (FLONASE ) 50 MCG/ACT nasal spray INSTILL ONE SPRAY INTO EACH NOSTRIL DAILY AS NEEDED FOR SEASONAL ALLERGY SYMPTOMS 48 mL 0   hydrocortisone  (ANUSOL -HC) 2.5 % rectal cream Apply 1 application topically as needed for hemorrhoids.      metoprolol  succinate (TOPROL -XL) 50 MG 24 hr tablet TAKE 1 TABLET BY MOUTH EVERY NIGHT AT   BEDTIME 90 tablet 1   morphine  (MS CONTIN ) 15 MG 12 hr tablet Take 1 tablet (15 mg total) by mouth 3 (three) times daily. 12 tablet 0   MOVANTIK 25 MG TABS tablet Take 25 mg by mouth daily.     nicotine  (NICODERM CQ  - DOSED IN MG/24 HOURS) 21 mg/24hr patch Place 1 patch (21 mg total) onto the  skin daily.     Olopatadine  HCl 0.2 % SOLN Place 1 drop into both eyes at bedtime as needed (allergies).     omega-3 acid ethyl esters (LOVAZA ) 1 g capsule TAKE 2 CAPSULES BY MOUTH TWICE A DAY 360 capsule 1   ondansetron  (ZOFRAN -ODT) 4 MG disintegrating tablet Take 1 tablet (4 mg total) by mouth every 8 (eight) hours as needed for nausea or vomiting. 20 tablet 0   Oxycodone  HCl 10 MG TABS Take 1 tablet (10 mg total) by mouth every 6 (six) hours as needed. 12 tablet 0   pregabalin  (LYRICA ) 100 MG capsule Take 100 mg by mouth 3 (three) times daily.     rosuvastatin  (CRESTOR ) 40 MG tablet TAKE 1 TABLET BY MOUTH ONCE A DAY 90 tablet 2   SYNTHROID  88 MCG tablet TAKE 1 TABLET BY MOUTH ONCE A DAY BEFORE BREAKFAST 90 tablet 1   venlafaxine  XR (EFFEXOR -XR) 150 MG 24 hr capsule Take 1 capsule (150 mg total) by mouth every evening. For mood control 30 capsule 0   ziprasidone  (GEODON ) 40 MG capsule TAKE 1 CAPSULE BY MOUTH EVERY EVENING WITH FOOD FOR MOOD 90 capsule 0   No current facility-administered medications for this visit.    Allergies  Allergen Reactions   Levothyroxine  Dermatitis    depression    Family History  Problem Relation Age of Onset   Diabetes type II Mother    Hypertension Mother    Diabetes Mother    Diabetes Sister    Hypertension Sister    Hypertension Maternal Grandmother    Diabetes Maternal Grandmother    Breast cancer Neg Hx     Social History   Socioeconomic History   Marital status: Divorced    Spouse name: Not on file   Number of children: Not on file   Years of education: Not on file   Highest education level: Not on file  Occupational History   Not on file  Tobacco Use   Smoking status: Some Days    Current packs/day: 1.00    Average packs/day: 1 pack/day for 40.0 years (40.0 ttl pk-yrs)    Types: Cigarettes   Smokeless tobacco: Never  Vaping Use   Vaping status: Never Used  Substance and Sexual Activity   Alcohol use: No   Drug use: No   Sexual  activity: Not Currently  Birth control/protection: Surgical  Other Topics Concern   Not on file  Social History Narrative   Not on file   Social Drivers of Health   Financial Resource Strain: Low Risk  (08/15/2022)   Overall Financial Resource Strain (CARDIA)    Difficulty of Paying Living Expenses: Not hard at all  Food Insecurity: No Food Insecurity (08/20/2023)   Hunger Vital Sign    Worried About Running Out of Food in the Last Year: Never true    Ran Out of Food in the Last Year: Never true  Transportation Needs: Unmet Transportation Needs (08/21/2023)   PRAPARE - Administrator, Civil Service (Medical): Yes    Lack of Transportation (Non-Medical): Yes  Physical Activity: Inactive (08/15/2022)   Exercise Vital Sign    Days of Exercise per Week: 0 days    Minutes of Exercise per Session: 0 min  Stress: No Stress Concern Present (08/15/2022)   [employer] of Occupational Health - Occupational Stress Questionnaire    Feeling of Stress : Not at all  Social Connections: Socially Isolated (08/15/2022)   Social Connection and Isolation Panel    Frequency of Communication with Friends and Family: More than three times a week    Frequency of Social Gatherings with Friends and Family: More than three times a week    Attends Religious Services: Never    Database administrator or Organizations: No    Attends Banker Meetings: Never    Marital Status: Divorced  Catering manager Violence: Not At Risk (08/20/2023)   Humiliation, Afraid, Rape, and Kick questionnaire    Fear of Current or Ex-Partner: No    Emotionally Abused: No    Physically Abused: No    Sexually Abused: No     Constitutional: Denies fever, malaise, fatigue, headache or abrupt weight changes.  HEENT: Denies eye pain, eye redness, ear pain, ringing in the ears, wax buildup, runny nose, nasal congestion, bloody nose, or sore throat. Respiratory: Denies difficulty breathing, shortness of breath,  cough or sputum production.   Cardiovascular: Denies chest pain, chest tightness, palpitations or swelling in the hands or feet.  Gastrointestinal: Patient reports chronic constipation.  Denies abdominal pain, bloating, diarrhea or blood in the stool.  GU: Denies urgency, frequency, pain with urination, burning sensation, blood in urine, odor or discharge. Musculoskeletal: Patient reports chronic muscle and joint pain.  Denies decrease in range of motion, difficulty with gait,  or joint swelling.  Skin: Denies redness, rashes, lesions or ulcercations.  Neurological: Denies dizziness, difficulty with memory, difficulty with speech or problems with balance and coordination.  Psych: Patient has a history of depression.  Denies anxiety, SI/HI.  No other specific complaints in a complete review of systems (except as listed in HPI above).  Objective:   Physical Exam  There were no vitals taken for this visit.  Wt Readings from Last 3 Encounters:  08/20/23 242 lb 15.2 oz (110.2 kg)  02/20/23 214 lb (97.1 kg)  08/15/22 196 lb (88.9 kg)    General: Appears her stated age, is, chronically ill.  In NAD. Skin: Warm, dry and intact.  PAD changes noted to BLE without ulceration. HEENT: Head: normal shape and size; Eyes: sclera white, no icterus, conjunctiva pink, PERRLA and EOMs intact;  Neck:  Neck supple, trachea midline. No masses, lumps or thyromegaly present.  Cardiovascular: Normal rate and rhythm. S1,S2 noted.  No murmur, rubs or gallops noted. No JVD or BLE edema. No carotid bruits noted. Pulmonary/Chest:  Normal effort and positive vesicular breath sounds. No respiratory distress. No wheezes, rales or ronchi noted.  Abdomen: Soft and nontender.  Musculoskeletal: Gait slow and steady with use of walker. Neurological: Alert and oriented.  Coordination normal.  Psychiatric: Mood and affect mildly flat. Behavior is normal. Judgment and thought content normal.    BMET    Component Value  Date/Time   NA 142 08/22/2023 0418   NA 140 03/30/2014 2045   K 4.0 08/23/2023 0357   K 3.7 03/30/2014 2045   CL 113 (H) 08/22/2023 0418   CL 106 03/30/2014 2045   CO2 24 08/22/2023 0418   CO2 28 03/30/2014 2045   GLUCOSE 90 08/22/2023 0418   GLUCOSE 96 03/30/2014 2045   BUN <5 (L) 08/22/2023 0418   BUN 7 03/30/2014 2045   CREATININE 0.47 08/22/2023 0418   CREATININE 0.69 02/20/2023 1550   CALCIUM  7.8 (L) 08/22/2023 0418   CALCIUM  8.7 03/30/2014 2045   GFRNONAA >60 08/22/2023 0418   GFRNONAA 102 08/08/2020 1124   GFRAA 118 08/08/2020 1124    Lipid Panel     Component Value Date/Time   CHOL 147 02/20/2023 1550   TRIG 122 02/20/2023 1550   HDL 53 02/20/2023 1550   CHOLHDL 2.8 02/20/2023 1550   LDLCALC 74 02/20/2023 1550    CBC    Component Value Date/Time   WBC 9.3 08/21/2023 0434   RBC 4.79 08/21/2023 0434   HGB 14.8 08/21/2023 0434   HGB 15.5 03/30/2014 2045   HCT 43.9 08/21/2023 0434   HCT 47.0 03/30/2014 2045   PLT 140 (L) 08/21/2023 0434   PLT 174 03/30/2014 2045   MCV 91.6 08/21/2023 0434   MCV 92 03/30/2014 2045   MCH 30.9 08/21/2023 0434   MCHC 33.7 08/21/2023 0434   RDW 14.9 08/21/2023 0434   RDW 15.4 (H) 03/30/2014 2045   LYMPHSABS 2.4 08/20/2023 0525   MONOABS 1.1 (H) 08/20/2023 0525   EOSABS 0.1 08/20/2023 0525   BASOSABS 0.0 08/20/2023 0525    Hgb A1C Lab Results  Component Value Date   HGBA1C 5.8 (H) 02/20/2023            Assessment & Plan:   Hospital follow-up for toxic encephalopathy secondary to polypharmacy:  Hospital notes, labs and imaging reviewed  Schedule an appointment for follow-up chronic conditions [name], NP

## 2023-11-23 ENCOUNTER — Telehealth: Payer: Self-pay

## 2023-11-23 NOTE — Telephone Encounter (Signed)
 Copied from CRM [id number]. Topic: Clinical - Order For Equipment >> Nov 23, 2023  3:06 PM Myrick T wrote: Reason for CRM: patient called stated she needs provider to put in order for a bedside commode and wheelchair. Please f/u with patient

## 2023-11-24 ENCOUNTER — Ambulatory Visit: Admitting: Internal Medicine

## 2023-11-24 ENCOUNTER — Other Ambulatory Visit: Payer: Self-pay | Admitting: Internal Medicine

## 2023-11-24 DIAGNOSIS — E7800 Pure hypercholesterolemia, unspecified: Secondary | ICD-10-CM

## 2023-11-24 DIAGNOSIS — I10 Essential (primary) hypertension: Secondary | ICD-10-CM

## 2023-11-24 DIAGNOSIS — K219 Gastro-esophageal reflux disease without esophagitis: Secondary | ICD-10-CM

## 2023-11-24 NOTE — Progress Notes (Deleted)
 Subjective:    Patient ID: April Soto, female    DOB: 04/07/1961, 63 y.o.   MRN: [id number]  HPI    Virtual Visit via Video Note  I connected with April Soto on 11/24/23 at  2:40 PM EDT by a video enabled telemedicine application and verified that I am speaking with the correct person using two identifiers.  Location: Patient: Home Provider: Office  Person's participating in this video call: April Laura, NP-C and Reka Wist   I discussed the limitations of evaluation and management by telemedicine and the availability of in person appointments. The patient expressed understanding and agreed to proceed.  History of Present Illness:   Patient presents to clinic today for hospital follow-up.  She presented to the ER 5/8 with complaint of altered mental status with agitation and hallucinations.  Initial labs revealed a WBC count of 10.9, chronic polycythemia with a H&H of 16.5/50.9.  Her lactic acid was 2.2 although this resolved with IV fluids and no source of sepsis was found.  Her ammonia was WNL.  Her blood cultures were negative.  Urinalysis was not concerning for infection and no urine culture was sent.  CT of the head and cervical spine did not show any evidence of acute findings.  Chest x-ray was normal.  X-ray of the right hip showed severe osteoarthritis confirmed by CT of the pelvis.  ABIs were normal.  She was diagnosed with toxic encephalopathy secondary to polypharmacy.  They discontinued her flexeril , hydroxyzine  and myrbetriq .  She was discharged on 5/13 to a skilled nursing facility.  Since that time.   Review of Systems     Past Medical History:  Diagnosis Date   Anemia    years ago   Anxiety    takes Ativan  daily   Asthma    Bipolar 1 disorder (HCC)    Chronic back pain    HNP   Constipation    takes Ra Col Rite daily   Degenerative disc disease    Depression    was on Prozac  but taken off by MD for a couple of weeks   Depression     Fibromyalgia    takes Lyrica  daily   Genital herpes    GERD (gastroesophageal reflux disease)    takes Nexium  daily   Headache(784.0)    Heart murmur    grew out of   History of bronchitis    last time 15+yrs ago   History of rheumatic fever    Hyperlipidemia    takes Simvastatin  daily   Hypertension    takes Coreg  daily   Hypothyroidism    takes Synthroid  daily   Joint pain    Muscle spasm    takes Flexeril  daily as needed   Nocturia    Osteoarthritis    Peripheral edema    takes HCTZ daily   Pneumonia    hx of;last time 30+yrs ago   Raynaud's disease    Rheumatic fever    as a child   Seasonal allergies    takes Zyrtec  daily and uses Dymista as well   Stroke [facility])    ? 5 years ago. No lasting deficits   Urinary incontinence    Weakness    nmbness and tingling-right leg    Current Outpatient Medications  Medication Sig Dispense Refill   acetaminophen  (TYLENOL ) 325 MG tablet Take 2 tablets (650 mg total) by mouth every 6 (six) hours as needed for mild pain (pain score 1-3), moderate pain (pain  score 4-6), fever or headache (or Fever >/= 101).     acyclovir  (ZOVIRAX ) 400 MG tablet TAKE 1 TABLET BY MOUTH DAILY 90 tablet 2   albuterol  (VENTOLIN  HFA) 108 (90 Base) MCG/ACT inhaler Inhale 2 puffs into the lungs every 4 (four) hours as needed for wheezing or shortness of breath.      aspirin  EC 81 MG tablet Take 1 tablet (81 mg total) by mouth daily. Swallow whole. 30 tablet 12   budesonide -formoterol  (SYMBICORT ) 160-4.5 MCG/ACT inhaler Inhale 2 puffs into the lungs daily.     cetirizine  (ZYRTEC ) 10 MG tablet Take 1 tablet (10 mg total) by mouth daily. 90 tablet 1   diclofenac  (VOLTAREN ) 75 MG EC tablet Take 1 tablet (75 mg total) by mouth 2 (two) times daily as needed for moderate pain (pain score 4-6).     esomeprazole  (NEXIUM ) 40 MG capsule TAKE ONE CAPSULE (40 MG TOTAL) BY MOUTH DAILY. 90 capsule 0   fluticasone  (FLONASE ) 50 MCG/ACT nasal spray INSTILL ONE SPRAY INTO  EACH NOSTRIL DAILY AS NEEDED FOR SEASONAL ALLERGY SYMPTOMS 48 mL 0   hydrocortisone  (ANUSOL -HC) 2.5 % rectal cream Apply 1 application topically as needed for hemorrhoids.      metoprolol  succinate (TOPROL -XL) 50 MG 24 hr tablet TAKE 1 TABLET BY MOUTH EVERY NIGHT AT   BEDTIME 90 tablet 1   morphine  (MS CONTIN ) 15 MG 12 hr tablet Take 1 tablet (15 mg total) by mouth 3 (three) times daily. 12 tablet 0   MOVANTIK 25 MG TABS tablet Take 25 mg by mouth daily.     nicotine  (NICODERM CQ  - DOSED IN MG/24 HOURS) 21 mg/24hr patch Place 1 patch (21 mg total) onto the skin daily.     Olopatadine  HCl 0.2 % SOLN Place 1 drop into both eyes at bedtime as needed (allergies).     omega-3 acid ethyl esters (LOVAZA ) 1 g capsule TAKE 2 CAPSULES BY MOUTH TWICE A DAY 360 capsule 1   ondansetron  (ZOFRAN -ODT) 4 MG disintegrating tablet Take 1 tablet (4 mg total) by mouth every 8 (eight) hours as needed for nausea or vomiting. 20 tablet 0   Oxycodone  HCl 10 MG TABS Take 1 tablet (10 mg total) by mouth every 6 (six) hours as needed. 12 tablet 0   pregabalin  (LYRICA ) 100 MG capsule Take 100 mg by mouth 3 (three) times daily.     rosuvastatin  (CRESTOR ) 40 MG tablet TAKE 1 TABLET BY MOUTH ONCE A DAY 90 tablet 2   SYNTHROID  88 MCG tablet TAKE 1 TABLET BY MOUTH ONCE A DAY BEFORE BREAKFAST 90 tablet 1   venlafaxine  XR (EFFEXOR -XR) 150 MG 24 hr capsule Take 1 capsule (150 mg total) by mouth every evening. For mood control 30 capsule 0   ziprasidone  (GEODON ) 40 MG capsule TAKE 1 CAPSULE BY MOUTH EVERY EVENING WITH FOOD FOR MOOD 90 capsule 0   No current facility-administered medications for this visit.    Allergies  Allergen Reactions   Levothyroxine  Dermatitis    depression    Family History  Problem Relation Age of Onset   Diabetes type II Mother    Hypertension Mother    Diabetes Mother    Diabetes Sister    Hypertension Sister    Hypertension Maternal Grandmother    Diabetes Maternal Grandmother    Breast cancer  Neg Hx     Social History   Socioeconomic History   Marital status: Divorced    Spouse name: Not on file   Number of  children: Not on file   Years of education: Not on file   Highest education level: Not on file  Occupational History   Not on file  Tobacco Use   Smoking status: Some Days    Current packs/day: 1.00    Average packs/day: 1 pack/day for 40.0 years (40.0 ttl pk-yrs)    Types: Cigarettes   Smokeless tobacco: Never  Vaping Use   Vaping status: Never Used  Substance and Sexual Activity   Alcohol use: No   Drug use: No   Sexual activity: Not Currently    Birth control/protection: Surgical  Other Topics Concern   Not on file  Social History Narrative   Not on file   Social Drivers of Health   Financial Resource Strain: Low Risk  (08/15/2022)   Overall Financial Resource Strain (CARDIA)    Difficulty of Paying Living Expenses: Not hard at all  Food Insecurity: No Food Insecurity (08/20/2023)   Hunger Vital Sign    Worried About Running Out of Food in the Last Year: Never true    Ran Out of Food in the Last Year: Never true  Transportation Needs: Unmet Transportation Needs (08/21/2023)   PRAPARE - Transportation    Lack of Transportation (Medical): Yes    Lack of Transportation (Non-Medical): Yes  Physical Activity: Inactive (08/15/2022)   Exercise Vital Sign    Days of Exercise per Week: 0 days    Minutes of Exercise per Session: 0 min  Stress: No Stress Concern Present (08/15/2022)   [employer] of Occupational Health - Occupational Stress Questionnaire    Feeling of Stress : Not at all  Social Connections: Socially Isolated (08/15/2022)   Social Connection and Isolation Panel    Frequency of Communication with Friends and Family: More than three times a week    Frequency of Social Gatherings with Friends and Family: More than three times a week    Attends Religious Services: Never    Database administrator or Organizations: No    Attends Tax inspector Meetings: Never    Marital Status: Divorced  Catering manager Violence: Not At Risk (08/20/2023)   Humiliation, Afraid, Rape, and Kick questionnaire    Fear of Current or Ex-Partner: No    Emotionally Abused: No    Physically Abused: No    Sexually Abused: No     Constitutional: Denies fever, malaise, fatigue, headache or abrupt weight changes.  HEENT: Denies eye pain, eye redness, ear pain, ringing in the ears, wax buildup, runny nose, nasal congestion, bloody nose, or sore throat. Respiratory: Denies difficulty breathing, shortness of breath, cough or sputum production.   Cardiovascular: Denies chest pain, chest tightness, palpitations or swelling in the hands or feet.  Gastrointestinal: Patient reports chronic constipation.  Denies abdominal pain, bloating, diarrhea or blood in the stool.  GU: Denies urgency, frequency, pain with urination, burning sensation, blood in urine, odor or discharge. Musculoskeletal: Patient reports chronic muscle and joint pain.  Denies decrease in range of motion, difficulty with gait,  or joint swelling.  Skin: Denies redness, rashes, lesions or ulcercations.  Neurological: Denies dizziness, difficulty with memory, difficulty with speech or problems with balance and coordination.  Psych: Patient has a history of depression.  Denies anxiety, SI/HI.  No other specific complaints in a complete review of systems (except as listed in HPI above).  Objective:   Physical Exam  There were no vitals taken for this visit.  Wt Readings from Last 3 Encounters:  08/20/23 242  lb 15.2 oz (110.2 kg)  02/20/23 214 lb (97.1 kg)  08/15/22 196 lb (88.9 kg)    General: Appears her stated age, is, chronically ill.  In NAD. Skin: Warm, dry and intact.  PAD changes noted to BLE without ulceration. HEENT: Head: normal shape and size; Eyes: sclera white, no icterus, conjunctiva pink, PERRLA and EOMs intact;  Neck:  Neck supple, trachea midline. No masses, lumps  or thyromegaly present.  Cardiovascular: Normal rate and rhythm. S1,S2 noted.  No murmur, rubs or gallops noted. No JVD or BLE edema. No carotid bruits noted. Pulmonary/Chest: Normal effort and positive vesicular breath sounds. No respiratory distress. No wheezes, rales or ronchi noted.  Abdomen: Soft and nontender.  Musculoskeletal: Gait slow and steady with use of walker. Neurological: Alert and oriented.  Coordination normal.  Psychiatric: Mood and affect mildly flat. Behavior is normal. Judgment and thought content normal.    BMET    Component Value Date/Time   NA 142 08/22/2023 0418   NA 140 03/30/2014 2045   K 4.0 08/23/2023 0357   K 3.7 03/30/2014 2045   CL 113 (H) 08/22/2023 0418   CL 106 03/30/2014 2045   CO2 24 08/22/2023 0418   CO2 28 03/30/2014 2045   GLUCOSE 90 08/22/2023 0418   GLUCOSE 96 03/30/2014 2045   BUN <5 (L) 08/22/2023 0418   BUN 7 03/30/2014 2045   CREATININE 0.47 08/22/2023 0418   CREATININE 0.69 02/20/2023 1550   CALCIUM  7.8 (L) 08/22/2023 0418   CALCIUM  8.7 03/30/2014 2045   GFRNONAA >60 08/22/2023 0418   GFRNONAA 102 08/08/2020 1124   GFRAA 118 08/08/2020 1124    Lipid Panel     Component Value Date/Time   CHOL 147 02/20/2023 1550   TRIG 122 02/20/2023 1550   HDL 53 02/20/2023 1550   CHOLHDL 2.8 02/20/2023 1550   LDLCALC 74 02/20/2023 1550    CBC    Component Value Date/Time   WBC 9.3 08/21/2023 0434   RBC 4.79 08/21/2023 0434   HGB 14.8 08/21/2023 0434   HGB 15.5 03/30/2014 2045   HCT 43.9 08/21/2023 0434   HCT 47.0 03/30/2014 2045   PLT 140 (L) 08/21/2023 0434   PLT 174 03/30/2014 2045   MCV 91.6 08/21/2023 0434   MCV 92 03/30/2014 2045   MCH 30.9 08/21/2023 0434   MCHC 33.7 08/21/2023 0434   RDW 14.9 08/21/2023 0434   RDW 15.4 (H) 03/30/2014 2045   LYMPHSABS 2.4 08/20/2023 0525   MONOABS 1.1 (H) 08/20/2023 0525   EOSABS 0.1 08/20/2023 0525   BASOSABS 0.0 08/20/2023 0525    Hgb A1C Lab Results  Component Value Date    HGBA1C 5.8 (H) 02/20/2023            Assessment & Plan:   Hospital follow-up for toxic encephalopathy secondary to polypharmacy:  Hospital notes, labs and imaging reviewed  Schedule an appointment for follow-up chronic conditions  Follow Up Instructions:    I discussed the assessment and treatment plan with the patient. The patient was provided an opportunity to ask questions and all were answered. The patient agreed with the plan and demonstrated an understanding of the instructions.   The patient was advised to call back or seek an in-person evaluation if the symptoms worsen or if the condition fails to improve as anticipated.  April Laura, NP

## 2023-11-24 NOTE — Telephone Encounter (Signed)
 She has an appointment today.  We can discuss this then

## 2023-11-25 ENCOUNTER — Other Ambulatory Visit: Payer: Self-pay | Admitting: Internal Medicine

## 2023-11-25 DIAGNOSIS — A6000 Herpesviral infection of urogenital system, unspecified: Secondary | ICD-10-CM

## 2023-11-27 ENCOUNTER — Encounter: Payer: Self-pay | Admitting: Internal Medicine

## 2023-11-27 ENCOUNTER — Ambulatory Visit: Payer: Self-pay

## 2023-11-27 ENCOUNTER — Telehealth (INDEPENDENT_AMBULATORY_CARE_PROVIDER_SITE_OTHER): Admitting: Internal Medicine

## 2023-11-27 DIAGNOSIS — G928 Other toxic encephalopathy: Secondary | ICD-10-CM | POA: Diagnosis not present

## 2023-11-27 DIAGNOSIS — Z79899 Other long term (current) drug therapy: Secondary | ICD-10-CM

## 2023-11-27 DIAGNOSIS — I7300 Raynaud's syndrome without gangrene: Secondary | ICD-10-CM

## 2023-11-27 NOTE — Patient Instructions (Signed)
 Raynaud's Phenomenon  Raynaud's phenomenon is a condition that affects the blood vessels (arteries) that carry blood to the fingers and toes. The arteries that supply blood to the ears, lips, nipples, or the tip of the nose might also be affected. Raynaud's phenomenon causes the arteries to become narrow temporarily (spasm). As a result, the flow of blood to the affected areas is temporarily decreased. This usually occurs in response to cold temperatures or stress. During an attack, the skin in the affected areas turns white, then blue, and finally red. A person may also feel tingling or numbness in those areas. Attacks usually last for only a brief period, and then the blood flow to the area returns to normal. In most cases, Raynaud's phenomenon does not cause serious health problems. What are the causes? In many cases, the cause of this condition is not known. The condition may occur on its own (primary Raynaud's phenomenon) or may be associated with other diseases or factors (secondary Raynaud's phenomenon). Possible causes may include: Diseases or medical conditions that damage the arteries. Injuries and repetitive actions that hurt the hands or feet. Being exposed to certain chemicals. Taking medicines that narrow the arteries. Other medical conditions, such as lupus, scleroderma, rheumatoid arthritis, thyroid problems, blood disorders, Sjogren syndrome, or atherosclerosis. What increases the risk? The following factors may make you more likely to develop this condition: Being 18-78 years old. Being female. Having a family history of Raynaud's phenomenon. Living in a cold climate. Smoking. What are the signs or symptoms? Symptoms of this condition usually occur when you are exposed to cold temperatures or when you have emotional stress. The symptoms may last for a few minutes or up to several hours. They usually affect your fingers but may also affect your toes, nipples, lips, ears, or the  tip of your nose. Symptoms may include: Changes in skin color. The skin in the affected areas will turn pale or white. The skin may then change from white to bluish to red as normal blood flow returns to the area. Numbness, tingling, or pain in the affected areas. In severe cases, symptoms may include: Skin sores. Tissues decaying and dying (gangrene). How is this diagnosed? This condition may be diagnosed based on: Your symptoms and medical history. A physical exam. During the exam, you may be asked to put your hands in cold water to check for a reaction to cold temperature. Tests, such as: Blood tests to check for other diseases or conditions. A test to check the movement of blood through your arteries and veins (vascular ultrasound). A test in which the skin at the base of your fingernail is examined under a microscope (nailfold capillaroscopy). How is this treated? During an episode, you can take actions to help symptoms go away faster. Options include moving your arms around in a windmill pattern, warming your fingers under warm water, or placing your fingers in a warm body fold, such as your armpit. Long-term treatment for this condition often involves making lifestyle changes and taking steps to control your exposure to cold temperature. For more severe cases, medicine (calcium channel blockers) may be used to improve blood circulation. Follow these instructions at home: Avoiding cold temperatures Take these steps to avoid exposure to cold: If possible, stay indoors during cold weather. When you go outside during cold weather, dress in layers and wear mittens, a hat, a scarf, and warm footwear. Wear mittens or gloves when handling ice or frozen food. Use holders for glasses or cans containing  cold drinks. Let warm water run for a while before taking a shower or bath. Warm up the car before driving in cold weather. Lifestyle If possible, avoid stressful and emotional situations. Try  to find ways to manage your stress, such as: Exercise. Yoga. Meditation. Biofeedback. Do not use any products that contain nicotine or tobacco. These products include cigarettes, chewing tobacco, and vaping devices, such as e-cigarettes. If you need help quitting, ask your health care provider. Avoid secondhand smoke. Limit your use of caffeine. Switch to decaffeinated coffee, tea, and soda. Avoid chocolate. Avoid vibrating tools and machinery. General instructions Protect your hands and feet from injuries, cuts, or bruises. Avoid wearing tight rings or wristbands. Wear loose fitting socks and comfortable, roomy shoes. Take over-the-counter and prescription medicines only as told by your health care provider. Where to find support Raynaud's Association: www.raynauds.org Where to find more information [employer] of Arthritis and Musculoskeletal and Skin Diseases: www.niams.http://www.myers.net/ Contact a health care provider if: Your discomfort becomes worse despite lifestyle changes. You develop sores on your fingers or toes that do not heal. You have breaks in the skin on your fingers or toes. You have a fever. You have pain or swelling in your joints. You have a rash. Your symptoms occur on only one side of your body. Get help right away if: Your fingers or toes turn black. You have severe pain in the affected areas. These symptoms may represent a serious problem that is an emergency. Do not wait to see if the symptoms will go away. Get medical help right away. Call your local emergency services (911 in the [place] Do not drive yourself to the hospital. Summary Raynaud's phenomenon is a condition that affects the arteries that carry blood to the fingers, toes, ears, lips, nipples, or the tip of the nose. In many cases, the cause of this condition is not known. Symptoms of this condition include changes in skin color along with numbness and tingling in the affected area. Treatment for  this condition includes lifestyle changes and reducing exposure to cold temperatures. Medicines may be used for severe cases of the condition. Contact your health care provider if your condition worsens despite treatment. This information is not intended to replace advice given to you by your health care provider. Make sure you discuss any questions you have with your health care provider. Document Revised: 06/05/2020 Document Reviewed: 06/05/2020 Elsevier Patient Education  2024 [employer].

## 2023-11-27 NOTE — Telephone Encounter (Signed)
 Requested medication (s) are due for refill today: yes  Requested medication (s) are on the active medication list: yes  Last refill:  01/23/23  Future visit scheduled: {Yes,11/27/23  Notes to clinic:  Unable to refill per protocol, appointment needed. Patient has a virtual visit 11/27/23, last 3 OV no shows, routing for approval.     Requested Prescriptions  Pending Prescriptions Disp Refills   acyclovir  (ZOVIRAX ) 400 MG tablet [Pharmacy Med Name: ACYCLOVIR  400MG  TABLET] 90 tablet 2    Sig: TAKE 1 TABLET BY MOUTH ONCE A DAY     Antimicrobials:  Antiviral Agents - Anti-Herpetic Passed - 11/27/2023  1:56 PM      Passed - Valid encounter within last 12 months    Recent Outpatient Visits   None

## 2023-11-27 NOTE — Telephone Encounter (Signed)
 FYI Only or Action Required?: Action required by provider: See notes for patient request.  Patient was last seen in primary care on 11/27/2023 by Antonette Angeline ORN, NP.  Called Nurse Triage reporting Foot Pain.  Symptoms began several months ago.  Interventions attempted: Nothing.  Symptoms are: gradually worsening.  Triage Disposition: Call PCP Within 24 Hours  Patient/caregiver understands and will follow disposition?: Yes        Copied from CRM #[id number]. Topic: Clinical - Red Word Triage >> Nov 27, 2023  2:44 PM Harlene ORN wrote: Red Word that prompted transfer to Nurse Triage: Son called on behalf of the patient. Patient's right foot is giving her a very hard time walking. There is numbness and discoloration, even after leaving the hospital. Reason for Disposition  [1] Follow-up call from patient regarding patient's clinical status AND [2] information NON-URGENT  Answer Assessment - Initial Assessment Questions Patient has a hx of surgery on R ankle. Pt has had R foot numbness, discoloration. Patient's son states patient's foot looks darker in color and they look purple. Son states she is able to feel pressure and touch on her foot but she walks on the side of her foot due to symptoms. Spoke with patient's son unable to assess patient. Patient requesting a referral to a specialist.  Patient's son requesting a call back to [phone] cell  and would like to share email: [email or web address] to report additional results.   1. REASON FOR CALL or QUESTION: What is your reason for calling today? or How can I best     Referral to nursing home possibly and referral to a specialist for foot pain.  2. CALLER: Document the source of call. (e.g., laboratory staff, caregiver or patient).     Patient's son.  Protocols used: PCP Call - No Triage-A-AH

## 2023-11-27 NOTE — Progress Notes (Signed)
 Subjective:    Patient ID: April Soto, female    DOB: 07-21-1960, 63 y.o.   MRN: [id number]  HPI    Virtual Visit via Video Note  I connected with April Soto on 11/27/23 at  2:20 PM EDT by a video enabled telemedicine application and verified that I am speaking with the correct person using two identifiers.  Location: Patient: Home Provider: Office  Person's participating in this video call: April Laura, NP-C and April Soto   I discussed the limitations of evaluation and management by telemedicine and the availability of in person appointments. The patient expressed understanding and agreed to proceed.  History of Present Illness:   Patient presents to clinic today for hospital follow-up.  She presented to the ER 5/8 with complaint of a fall complicated by altered mental status with agitation and hallucinations.  Initial labs revealed a WBC count of 10.9, chronic polycythemia with a H&H of 16.5/50.9.  Her lactic acid was 2.2 although this resolved with IV fluids and no source of sepsis was found.  Her ammonia was WNL.  Her blood cultures were negative.  Urinalysis was not concerning for infection and no urine culture was sent.  CT of the head and cervical spine did not show any evidence of acute findings.  Chest x-ray was normal.  X-ray of the right hip showed severe osteoarthritis confirmed by CT of the pelvis.  ABIs were normal.  She was diagnosed with toxic encephalopathy secondary to polypharmacy.  They discontinued her flexeril , hydroxyzine  and myrbetriq .  She was discharged on 5/13 to a skilled nursing facility, eventually discharged 5/26.  Since that time, she reports persistent fatigue and is concerned about persistent discoloration of both of her legs.  She has a history of Raynaud's but is not currently getting any treatment at this time.  She denies open ulcerations.  She reports she has seen [facility] in the past and would like a referral to see  them again.   Review of Systems     Past Medical History:  Diagnosis Date   Anemia    years ago   Anxiety    takes Ativan  daily   Asthma    Bipolar 1 disorder (HCC)    Chronic back pain    HNP   Constipation    takes Ra Col Rite daily   Degenerative disc disease    Depression    was on Prozac  but taken off by MD for a couple of weeks   Depression    Fibromyalgia    takes Lyrica  daily   Genital herpes    GERD (gastroesophageal reflux disease)    takes Nexium  daily   Headache(784.0)    Heart murmur    grew out of   History of bronchitis    last time 15+yrs ago   History of rheumatic fever    Hyperlipidemia    takes Simvastatin  daily   Hypertension    takes Coreg  daily   Hypothyroidism    takes Synthroid  daily   Joint pain    Muscle spasm    takes Flexeril  daily as needed   Nocturia    Osteoarthritis    Peripheral edema    takes HCTZ daily   Pneumonia    hx of;last time 30+yrs ago   Raynaud's disease    Rheumatic fever    as a child   Seasonal allergies    takes Zyrtec  daily and uses Dymista as well   Stroke [facility])    ?  5 years ago. No lasting deficits   Urinary incontinence    Weakness    nmbness and tingling-right leg    Current Outpatient Medications  Medication Sig Dispense Refill   acetaminophen  (TYLENOL ) 325 MG tablet Take 2 tablets (650 mg total) by mouth every 6 (six) hours as needed for mild pain (pain score 1-3), moderate pain (pain score 4-6), fever or headache (or Fever >/= 101).     acyclovir  (ZOVIRAX ) 400 MG tablet TAKE 1 TABLET BY MOUTH DAILY 90 tablet 2   albuterol  (VENTOLIN  HFA) 108 (90 Base) MCG/ACT inhaler Inhale 2 puffs into the lungs every 4 (four) hours as needed for wheezing or shortness of breath.      aspirin  EC 81 MG tablet Take 1 tablet (81 mg total) by mouth daily. Swallow whole. 30 tablet 12   budesonide -formoterol  (SYMBICORT ) 160-4.5 MCG/ACT inhaler Inhale 2 puffs into the lungs daily.     cetirizine  (ZYRTEC ) 10 MG  tablet Take 1 tablet (10 mg total) by mouth daily. 90 tablet 1   diclofenac  (VOLTAREN ) 75 MG EC tablet Take 1 tablet (75 mg total) by mouth 2 (two) times daily as needed for moderate pain (pain score 4-6).     esomeprazole  (NEXIUM ) 40 MG capsule TAKE ONE CAPSULE (40 MG TOTAL) BY MOUTH DAILY. 90 capsule 0   fluticasone  (FLONASE ) 50 MCG/ACT nasal spray INSTILL ONE SPRAY INTO EACH NOSTRIL DAILY AS NEEDED FOR SEASONAL ALLERGY SYMPTOMS 48 mL 0   hydrocortisone  (ANUSOL -HC) 2.5 % rectal cream Apply 1 application topically as needed for hemorrhoids.      metoprolol  succinate (TOPROL -XL) 50 MG 24 hr tablet TAKE 1 TABLET BY MOUTH EVERY NIGHT AT   BEDTIME 90 tablet 1   morphine  (MS CONTIN ) 15 MG 12 hr tablet Take 1 tablet (15 mg total) by mouth 3 (three) times daily. 12 tablet 0   MOVANTIK 25 MG TABS tablet Take 25 mg by mouth daily.     nicotine  (NICODERM CQ  - DOSED IN MG/24 HOURS) 21 mg/24hr patch Place 1 patch (21 mg total) onto the skin daily.     Olopatadine  HCl 0.2 % SOLN Place 1 drop into both eyes at bedtime as needed (allergies).     omega-3 acid ethyl esters (LOVAZA ) 1 g capsule TAKE 2 CAPSULES BY MOUTH TWICE A DAY 360 capsule 1   ondansetron  (ZOFRAN -ODT) 4 MG disintegrating tablet Take 1 tablet (4 mg total) by mouth every 8 (eight) hours as needed for nausea or vomiting. 20 tablet 0   Oxycodone  HCl 10 MG TABS Take 1 tablet (10 mg total) by mouth every 6 (six) hours as needed. 12 tablet 0   pregabalin  (LYRICA ) 100 MG capsule Take 100 mg by mouth 3 (three) times daily.     rosuvastatin  (CRESTOR ) 40 MG tablet TAKE 1 TABLET BY MOUTH ONCE A DAY 90 tablet 2   SYNTHROID  88 MCG tablet TAKE 1 TABLET BY MOUTH ONCE A DAY BEFORE BREAKFAST 90 tablet 1   venlafaxine  XR (EFFEXOR -XR) 150 MG 24 hr capsule Take 1 capsule (150 mg total) by mouth every evening. For mood control 30 capsule 0   ziprasidone  (GEODON ) 40 MG capsule TAKE 1 CAPSULE BY MOUTH EVERY EVENING WITH FOOD FOR MOOD 90 capsule 0   No current  facility-administered medications for this visit.    Allergies  Allergen Reactions   Levothyroxine  Dermatitis    depression    Family History  Problem Relation Age of Onset   Diabetes type II Mother    Hypertension  Mother    Diabetes Mother    Diabetes Sister    Hypertension Sister    Hypertension Maternal Grandmother    Diabetes Maternal Grandmother    Breast cancer Neg Hx     Social History   Socioeconomic History   Marital status: Divorced    Spouse name: Not on file   Number of children: Not on file   Years of education: Not on file   Highest education level: Associate degree: academic program  Occupational History   Not on file  Tobacco Use   Smoking status: Some Days    Current packs/day: 1.00    Average packs/day: 1 pack/day for 40.0 years (40.0 ttl pk-yrs)    Types: Cigarettes   Smokeless tobacco: Never  Vaping Use   Vaping status: Never Used  Substance and Sexual Activity   Alcohol use: No   Drug use: No   Sexual activity: Not Currently    Birth control/protection: Surgical  Other Topics Concern   Not on file  Social History Narrative   Not on file   Social Drivers of Health   Financial Resource Strain: High Risk (11/26/2023)   Overall Financial Resource Strain (CARDIA)    Difficulty of Paying Living Expenses: Very hard  Food Insecurity: Patient Declined (11/26/2023)   Hunger Vital Sign    Worried About Running Out of Food in the Last Year: Patient declined    Ran Out of Food in the Last Year: Patient declined  Transportation Needs: Unmet Transportation Needs (11/26/2023)   PRAPARE - Administrator, Civil Service (Medical): Yes    Lack of Transportation (Non-Medical): Yes  Physical Activity: Unknown (11/26/2023)   Exercise Vital Sign    Days of Exercise per Week: Patient declined    Minutes of Exercise per Session: Not on file  Stress: Patient Declined (11/26/2023)   [employer] of Occupational Health - Occupational Stress  Questionnaire    Feeling of Stress: Patient declined  Social Connections: Unknown (11/26/2023)   Social Connection and Isolation Panel    Frequency of Communication with Friends and Family: Patient declined    Frequency of Social Gatherings with Friends and Family: Patient declined    Attends Religious Services: Patient declined    Database administrator or Organizations: Patient declined    Attends Banker Meetings: Not on file    Marital Status: Patient declined  Intimate Partner Violence: Not At Risk (08/20/2023)   Humiliation, Afraid, Rape, and Kick questionnaire    Fear of Current or Ex-Partner: No    Emotionally Abused: No    Physically Abused: No    Sexually Abused: No     Constitutional: Patient reports fatigue.  Denies fever, malaise, headache or abrupt weight changes.  HEENT: Denies eye pain, eye redness, ear pain, ringing in the ears, wax buildup, runny nose, nasal congestion, bloody nose, or sore throat. Respiratory: Patient reports intermittent shortness of breath.  Denies difficulty breathing, cough or sputum production.   Cardiovascular: Denies chest pain, chest tightness, palpitations or swelling in the hands or feet.  Gastrointestinal: Patient reports chronic constipation.  Denies abdominal pain, bloating, diarrhea or blood in the stool.  GU: Denies urgency, frequency, pain with urination, burning sensation, blood in urine, odor or discharge. Musculoskeletal: Patient reports chronic muscle and joint pain.  Denies decrease in range of motion, difficulty with gait,  or joint swelling.  Skin: Denies redness, rashes, lesions or ulcercations.  Neurological: Denies dizziness, difficulty with memory, difficulty with speech or  problems with balance and coordination.  Psych: Patient has a history of depression.  Denies anxiety, SI/HI.  No other specific complaints in a complete review of systems (except as listed in HPI above).  Objective:   Physical Exam    Wt  Readings from Last 3 Encounters:  08/20/23 242 lb 15.2 oz (110.2 kg)  02/20/23 214 lb (97.1 kg)  08/15/22 196 lb (88.9 kg)    General: Appears her stated age, is, chronically ill appearing, in NAD. Pulmonary/Chest: Normal effort. No respiratory distress.  CV: She is unsure of how to check pedal pulses at this time. Skin: Blue discoloration noted of BLE without ulceration. Neurological: Alert and oriented.    BMET    Component Value Date/Time   NA 142 08/22/2023 0418   NA 140 03/30/2014 2045   K 4.0 08/23/2023 0357   K 3.7 03/30/2014 2045   CL 113 (H) 08/22/2023 0418   CL 106 03/30/2014 2045   CO2 24 08/22/2023 0418   CO2 28 03/30/2014 2045   GLUCOSE 90 08/22/2023 0418   GLUCOSE 96 03/30/2014 2045   BUN <5 (L) 08/22/2023 0418   BUN 7 03/30/2014 2045   CREATININE 0.47 08/22/2023 0418   CREATININE 0.69 02/20/2023 1550   CALCIUM  7.8 (L) 08/22/2023 0418   CALCIUM  8.7 03/30/2014 2045   GFRNONAA >60 08/22/2023 0418   GFRNONAA 102 08/08/2020 1124   GFRAA 118 08/08/2020 1124    Lipid Panel     Component Value Date/Time   CHOL 147 02/20/2023 1550   TRIG 122 02/20/2023 1550   HDL 53 02/20/2023 1550   CHOLHDL 2.8 02/20/2023 1550   LDLCALC 74 02/20/2023 1550    CBC    Component Value Date/Time   WBC 9.3 08/21/2023 0434   RBC 4.79 08/21/2023 0434   HGB 14.8 08/21/2023 0434   HGB 15.5 03/30/2014 2045   HCT 43.9 08/21/2023 0434   HCT 47.0 03/30/2014 2045   PLT 140 (L) 08/21/2023 0434   PLT 174 03/30/2014 2045   MCV 91.6 08/21/2023 0434   MCV 92 03/30/2014 2045   MCH 30.9 08/21/2023 0434   MCHC 33.7 08/21/2023 0434   RDW 14.9 08/21/2023 0434   RDW 15.4 (H) 03/30/2014 2045   LYMPHSABS 2.4 08/20/2023 0525   MONOABS 1.1 (H) 08/20/2023 0525   EOSABS 0.1 08/20/2023 0525   BASOSABS 0.0 08/20/2023 0525    Hgb A1C Lab Results  Component Value Date   HGBA1C 5.8 (H) 02/20/2023            Assessment & Plan:   Hospital follow-up for encephalopathy secondary to  polypharmacy, raynaud's:  Hospital notes, labs and imaging reviewed She reports she has stopped hydroxyzine , Myrbetriq  and cyclobenzaprine  as prescribed She is taking all other previous medications Recommend blood workup for fatigue however she has transportation issues and is unable to come into the office at this time Referral to [facility] placed for evaluation of Raynaud's  RTC in 3 months for your annual exam  Follow Up Instructions:    I discussed the assessment and treatment plan with the patient. The patient was provided an opportunity to ask questions and all were answered. The patient agreed with the plan and demonstrated an understanding of the instructions.   The patient was advised to call back or seek an in-person evaluation if the symptoms worsen or if the condition fails to improve as anticipated.  April Laura, NP

## 2023-11-27 NOTE — Telephone Encounter (Signed)
 Requested medication (s) are due for refill today: yes  Requested medication (s) are on the active medication list: yes   Last refill:  flonase - 07/01/23 #48 ml 0 refills, synthroid - 05/15/23 #90 1 refills, lovaza -1/32/25 #360 1 refills, metoprolol - 05/15/23 #90 1 refill, nexium - 07/31/23 #90 0 refill  Future visit scheduled: yes today   Notes to clinic:  appt today . Do you want to continue refills/doses  Rxs?     Requested Prescriptions  Pending Prescriptions Disp Refills   fluticasone  (FLONASE ) 50 MCG/ACT nasal spray [Pharmacy Med Name: FLUTICASONE  PROPIONATE 50MCG SUSPENSION] 48 mL 0    Sig: INSTILL ONE SPRAY INTO EACH NOSTRIL DAILY AS NEEDED FOR SEASONAL ALLERGY SYMPTOMS     Ear, Nose, and Throat: Nasal Preparations - Corticosteroids Failed - 11/27/2023 11:52 AM      Failed - Valid encounter within last 12 months    Recent Outpatient Visits   None             SYNTHROID  88 MCG tablet [Pharmacy Med Name: SYNTHROID  TABLET] 90 tablet 0    Sig: TAKE ONE TABLET BY MOUTH ONCE A DAY BEFORE BREAKFAST     Endocrinology:  Hypothyroid Agents Failed - 11/27/2023 11:52 AM      Failed - Valid encounter within last 12 months    Recent Outpatient Visits   None            Passed - TSH in normal range and within 360 days    TSH  Date Value Ref Range Status  08/20/2023 3.020 0.350 - 4.500 uIU/mL Final    Comment:    Performed by a 3rd Generation assay with a functional sensitivity of <=0.01 uIU/mL. Performed at [facility], [address] [place], [place] [address]   02/20/2023 4.13 0.40 - 4.50 mIU/L Final          omega-3 acid ethyl esters (LOVAZA ) 1 g capsule [Pharmacy Med Name: OMEGA-3-ACID  ETHYL ESTERS 1GM CAPSULE] 360 capsule 0    Sig: TAKE TWO CAPSULES BY MOUTH TWICE A DAY     Endocrinology:  Nutritional Agents - omega-3 acid ethyl esters Failed - 11/27/2023 11:52 AM      Failed - Valid encounter within last 12 months    Recent Outpatient Visits   None             Failed - Lipid Panel in normal range within the last 12 months    Cholesterol  Date Value Ref Range Status  02/20/2023 147 <200 mg/dL Final   LDL Cholesterol (Calc)  Date Value Ref Range Status  02/20/2023 74 mg/dL (calc) Final    Comment:    Reference range: <100 . Desirable range <100 mg/dL for primary prevention;   <70 mg/dL for patients with CHD or diabetic patients  with > or = 2 CHD risk factors. SABRA LDL-C is now calculated using the Martin-Hopkins  calculation, which is a validated novel method providing  better accuracy than the Friedewald equation in the  estimation of LDL-C.  Gladis APPLETHWAITE et al. SANDREA. 7986;689(80): 2061-2068  (http://education.[facility].com/faq/FAQ164)    HDL  Date Value Ref Range Status  02/20/2023 53 > OR = 50 mg/dL Final   Triglycerides  Date Value Ref Range Status  02/20/2023 122 <150 mg/dL Final          metoprolol  succinate (TOPROL -XL) 50 MG 24 hr tablet [Pharmacy Med Name: METOPROLOL  SUCCINATE ER 50MG  ER TABLET ER 24HR] 90 tablet 0    Sig: TAKE ONE TABLET BY  MOUTH EVERY NIGHT AT BEDTIME     Cardiovascular:  Beta Blockers Failed - 11/27/2023 11:52 AM      Failed - Valid encounter within last 6 months    Recent Outpatient Visits   None            Passed - Last BP in normal range    BP Readings from Last 1 Encounters:  08/25/23 113/73         Passed - Last Heart Rate in normal range    Pulse Readings from Last 1 Encounters:  08/25/23 95          esomeprazole  (NEXIUM ) 40 MG capsule [Pharmacy Med Name: ESOMEPRAZOLE  MAGNESIUM  40MG  DR CAPSULE DR] 90 capsule 0    Sig: TAKE ONE CAPSULE (40 MG TOTAL) BY MOUTH DAILY.     Gastroenterology: Proton Pump Inhibitors 2 Failed - 11/27/2023 11:52 AM      Failed - Valid encounter within last 12 months    Recent Outpatient Visits   None            Passed - ALT in normal range and within 360 days    ALT  Date Value Ref Range Status  08/21/2023 21 0 - 44 U/L Final    SGPT (ALT)  Date Value Ref Range Status  03/30/2014 20 U/L Final    Comment:    14-63 NOTE: New Reference Range 11/01/13          Passed - AST in normal range and within 360 days    AST  Date Value Ref Range Status  08/21/2023 36 15 - 41 U/L Final   SGOT(AST)  Date Value Ref Range Status  03/30/2014 18 15 - 37 Unit/L Final

## 2023-11-28 NOTE — Telephone Encounter (Signed)
 I just saw her for a virtual visit and referred her to vascular surgery.  I advised her that I needed to see her in the office to discuss any additional concerns and so that I could actually examine her.  She will also need a visit to fill out [place] 2 form if she is looking to transfer into a rehab or skilled nursing facility.

## 2023-11-30 NOTE — Telephone Encounter (Signed)
 Son was advised and verbalized understanding. Did not want to schedule any appointments at this time. Waiting to hear about the referral and will go from there.

## 2023-12-02 ENCOUNTER — Telehealth: Payer: Self-pay

## 2023-12-02 NOTE — Telephone Encounter (Signed)
 Reason for CRM: patient called stated she needs provider to put in order for a bedside commode and wheelchair. Please f/u with patient

## 2023-12-02 NOTE — Telephone Encounter (Signed)
 This patient needs to be dismissed.

## 2023-12-02 NOTE — Telephone Encounter (Signed)
 See triage note from 8/15.  I advised her that she needed to be seen in person as this was not discussed during her hospital follow-up.

## 2023-12-03 ENCOUNTER — Encounter: Payer: Self-pay | Admitting: Internal Medicine

## 2023-12-03 DIAGNOSIS — M542 Cervicalgia: Secondary | ICD-10-CM | POA: Diagnosis not present

## 2023-12-03 DIAGNOSIS — G894 Chronic pain syndrome: Secondary | ICD-10-CM | POA: Diagnosis not present

## 2023-12-03 DIAGNOSIS — M5416 Radiculopathy, lumbar region: Secondary | ICD-10-CM | POA: Diagnosis not present

## 2023-12-03 DIAGNOSIS — Z79891 Long term (current) use of opiate analgesic: Secondary | ICD-10-CM | POA: Diagnosis not present

## 2023-12-03 DIAGNOSIS — Z79899 Other long term (current) drug therapy: Secondary | ICD-10-CM | POA: Diagnosis not present

## 2023-12-03 DIAGNOSIS — F17210 Nicotine dependence, cigarettes, uncomplicated: Secondary | ICD-10-CM | POA: Diagnosis not present

## 2023-12-03 DIAGNOSIS — K5903 Drug induced constipation: Secondary | ICD-10-CM | POA: Diagnosis not present

## 2023-12-17 ENCOUNTER — Emergency Department

## 2023-12-17 ENCOUNTER — Inpatient Hospital Stay
Admission: EM | Admit: 2023-12-17 | Discharge: 2023-12-24 | DRG: 542 | Disposition: A | Discharge status: Skilled Nursing Facility | Attending: Internal Medicine | Admitting: Internal Medicine

## 2023-12-17 ENCOUNTER — Encounter: Payer: Self-pay | Admitting: *Deleted

## 2023-12-17 DIAGNOSIS — M4850XA Collapsed vertebra, not elsewhere classified, site unspecified, initial encounter for fracture: Secondary | ICD-10-CM | POA: Diagnosis present

## 2023-12-17 DIAGNOSIS — Z8673 Personal history of transient ischemic attack (TIA), and cerebral infarction without residual deficits: Secondary | ICD-10-CM

## 2023-12-17 DIAGNOSIS — E785 Hyperlipidemia, unspecified: Secondary | ICD-10-CM | POA: Diagnosis present

## 2023-12-17 DIAGNOSIS — Z5986 Financial insecurity: Secondary | ICD-10-CM

## 2023-12-17 DIAGNOSIS — R609 Edema, unspecified: Secondary | ICD-10-CM | POA: Diagnosis not present

## 2023-12-17 DIAGNOSIS — F32A Depression, unspecified: Secondary | ICD-10-CM | POA: Diagnosis present

## 2023-12-17 DIAGNOSIS — M545 Low back pain, unspecified: Secondary | ICD-10-CM | POA: Diagnosis not present

## 2023-12-17 DIAGNOSIS — E66811 Obesity, class 1: Secondary | ICD-10-CM | POA: Diagnosis present

## 2023-12-17 DIAGNOSIS — R531 Weakness: Secondary | ICD-10-CM | POA: Diagnosis not present

## 2023-12-17 DIAGNOSIS — H814 Vertigo of central origin: Secondary | ICD-10-CM | POA: Diagnosis not present

## 2023-12-17 DIAGNOSIS — R7881 Bacteremia: Secondary | ICD-10-CM | POA: Diagnosis present

## 2023-12-17 DIAGNOSIS — W010XXA Fall on same level from slipping, tripping and stumbling without subsequent striking against object, initial encounter: Secondary | ICD-10-CM | POA: Diagnosis present

## 2023-12-17 DIAGNOSIS — R110 Nausea: Secondary | ICD-10-CM | POA: Diagnosis not present

## 2023-12-17 DIAGNOSIS — R600 Localized edema: Secondary | ICD-10-CM | POA: Diagnosis present

## 2023-12-17 DIAGNOSIS — Z888 Allergy status to other drugs, medicaments and biological substances status: Secondary | ICD-10-CM

## 2023-12-17 DIAGNOSIS — R52 Pain, unspecified: Secondary | ICD-10-CM | POA: Diagnosis not present

## 2023-12-17 DIAGNOSIS — Z79890 Hormone replacement therapy (postmenopausal): Secondary | ICD-10-CM | POA: Diagnosis not present

## 2023-12-17 DIAGNOSIS — M542 Cervicalgia: Secondary | ICD-10-CM | POA: Diagnosis present

## 2023-12-17 DIAGNOSIS — R109 Unspecified abdominal pain: Secondary | ICD-10-CM | POA: Diagnosis not present

## 2023-12-17 DIAGNOSIS — Z90710 Acquired absence of both cervix and uterus: Secondary | ICD-10-CM

## 2023-12-17 DIAGNOSIS — F419 Anxiety disorder, unspecified: Secondary | ICD-10-CM | POA: Diagnosis present

## 2023-12-17 DIAGNOSIS — G893 Neoplasm related pain (acute) (chronic): Secondary | ICD-10-CM | POA: Diagnosis not present

## 2023-12-17 DIAGNOSIS — S22080A Wedge compression fracture of T11-T12 vertebra, initial encounter for closed fracture: Secondary | ICD-10-CM | POA: Diagnosis present

## 2023-12-17 DIAGNOSIS — Z5982 Transportation insecurity: Secondary | ICD-10-CM

## 2023-12-17 DIAGNOSIS — S22080D Wedge compression fracture of T11-T12 vertebra, subsequent encounter for fracture with routine healing: Secondary | ICD-10-CM | POA: Diagnosis not present

## 2023-12-17 DIAGNOSIS — M25551 Pain in right hip: Secondary | ICD-10-CM | POA: Diagnosis not present

## 2023-12-17 DIAGNOSIS — M797 Fibromyalgia: Secondary | ICD-10-CM | POA: Diagnosis present

## 2023-12-17 DIAGNOSIS — Z5941 Food insecurity: Secondary | ICD-10-CM

## 2023-12-17 DIAGNOSIS — M1611 Unilateral primary osteoarthritis, right hip: Secondary | ICD-10-CM | POA: Diagnosis not present

## 2023-12-17 DIAGNOSIS — M6259 Muscle wasting and atrophy, not elsewhere classified, multiple sites: Secondary | ICD-10-CM | POA: Diagnosis not present

## 2023-12-17 DIAGNOSIS — Z833 Family history of diabetes mellitus: Secondary | ICD-10-CM

## 2023-12-17 DIAGNOSIS — J9601 Acute respiratory failure with hypoxia: Secondary | ICD-10-CM | POA: Diagnosis present

## 2023-12-17 DIAGNOSIS — G928 Other toxic encephalopathy: Secondary | ICD-10-CM | POA: Diagnosis not present

## 2023-12-17 DIAGNOSIS — S199XXA Unspecified injury of neck, initial encounter: Secondary | ICD-10-CM | POA: Diagnosis not present

## 2023-12-17 DIAGNOSIS — Z7951 Long term (current) use of inhaled steroids: Secondary | ICD-10-CM | POA: Diagnosis not present

## 2023-12-17 DIAGNOSIS — M25571 Pain in right ankle and joints of right foot: Secondary | ICD-10-CM | POA: Diagnosis present

## 2023-12-17 DIAGNOSIS — R54 Age-related physical debility: Secondary | ICD-10-CM | POA: Diagnosis present

## 2023-12-17 DIAGNOSIS — S22030A Wedge compression fracture of third thoracic vertebra, initial encounter for closed fracture: Secondary | ICD-10-CM | POA: Diagnosis not present

## 2023-12-17 DIAGNOSIS — W19XXXA Unspecified fall, initial encounter: Principal | ICD-10-CM

## 2023-12-17 DIAGNOSIS — M4854XA Collapsed vertebra, not elsewhere classified, thoracic region, initial encounter for fracture: Secondary | ICD-10-CM | POA: Diagnosis present

## 2023-12-17 DIAGNOSIS — K5909 Other constipation: Secondary | ICD-10-CM | POA: Diagnosis present

## 2023-12-17 DIAGNOSIS — Z6834 Body mass index (BMI) 34.0-34.9, adult: Secondary | ICD-10-CM | POA: Diagnosis not present

## 2023-12-17 DIAGNOSIS — M160 Bilateral primary osteoarthritis of hip: Secondary | ICD-10-CM | POA: Diagnosis not present

## 2023-12-17 DIAGNOSIS — R2242 Localized swelling, mass and lump, left lower limb: Secondary | ICD-10-CM | POA: Diagnosis not present

## 2023-12-17 DIAGNOSIS — G9349 Other encephalopathy: Secondary | ICD-10-CM | POA: Diagnosis not present

## 2023-12-17 DIAGNOSIS — Z515 Encounter for palliative care: Secondary | ICD-10-CM

## 2023-12-17 DIAGNOSIS — Z7982 Long term (current) use of aspirin: Secondary | ICD-10-CM | POA: Diagnosis not present

## 2023-12-17 DIAGNOSIS — R918 Other nonspecific abnormal finding of lung field: Secondary | ICD-10-CM | POA: Diagnosis not present

## 2023-12-17 DIAGNOSIS — E039 Hypothyroidism, unspecified: Secondary | ICD-10-CM | POA: Diagnosis present

## 2023-12-17 DIAGNOSIS — R0609 Other forms of dyspnea: Secondary | ICD-10-CM | POA: Diagnosis not present

## 2023-12-17 DIAGNOSIS — I700 Atherosclerosis of aorta: Secondary | ICD-10-CM | POA: Diagnosis not present

## 2023-12-17 DIAGNOSIS — K219 Gastro-esophageal reflux disease without esophagitis: Secondary | ICD-10-CM | POA: Diagnosis present

## 2023-12-17 DIAGNOSIS — Z8249 Family history of ischemic heart disease and other diseases of the circulatory system: Secondary | ICD-10-CM

## 2023-12-17 DIAGNOSIS — R5383 Other fatigue: Secondary | ICD-10-CM | POA: Diagnosis not present

## 2023-12-17 DIAGNOSIS — Z87891 Personal history of nicotine dependence: Secondary | ICD-10-CM

## 2023-12-17 DIAGNOSIS — S299XXA Unspecified injury of thorax, initial encounter: Secondary | ICD-10-CM | POA: Diagnosis not present

## 2023-12-17 DIAGNOSIS — J301 Allergic rhinitis due to pollen: Secondary | ICD-10-CM | POA: Diagnosis not present

## 2023-12-17 DIAGNOSIS — J449 Chronic obstructive pulmonary disease, unspecified: Secondary | ICD-10-CM | POA: Diagnosis not present

## 2023-12-17 DIAGNOSIS — Z23 Encounter for immunization: Secondary | ICD-10-CM

## 2023-12-17 DIAGNOSIS — G894 Chronic pain syndrome: Secondary | ICD-10-CM | POA: Diagnosis present

## 2023-12-17 DIAGNOSIS — Z1152 Encounter for screening for COVID-19: Secondary | ICD-10-CM | POA: Diagnosis not present

## 2023-12-17 DIAGNOSIS — R0902 Hypoxemia: Secondary | ICD-10-CM | POA: Diagnosis not present

## 2023-12-17 DIAGNOSIS — K5900 Constipation, unspecified: Secondary | ICD-10-CM | POA: Diagnosis not present

## 2023-12-17 DIAGNOSIS — Z7401 Bed confinement status: Secondary | ICD-10-CM | POA: Diagnosis not present

## 2023-12-17 DIAGNOSIS — B957 Other staphylococcus as the cause of diseases classified elsewhere: Secondary | ICD-10-CM | POA: Diagnosis present

## 2023-12-17 DIAGNOSIS — J189 Pneumonia, unspecified organism: Secondary | ICD-10-CM | POA: Diagnosis present

## 2023-12-17 DIAGNOSIS — S0990XA Unspecified injury of head, initial encounter: Secondary | ICD-10-CM | POA: Diagnosis not present

## 2023-12-17 DIAGNOSIS — J302 Other seasonal allergic rhinitis: Secondary | ICD-10-CM | POA: Diagnosis not present

## 2023-12-17 DIAGNOSIS — S22080S Wedge compression fracture of T11-T12 vertebra, sequela: Secondary | ICD-10-CM | POA: Diagnosis not present

## 2023-12-17 DIAGNOSIS — J45909 Unspecified asthma, uncomplicated: Secondary | ICD-10-CM | POA: Diagnosis present

## 2023-12-17 DIAGNOSIS — S79911A Unspecified injury of right hip, initial encounter: Secondary | ICD-10-CM | POA: Diagnosis not present

## 2023-12-17 DIAGNOSIS — R062 Wheezing: Secondary | ICD-10-CM | POA: Diagnosis not present

## 2023-12-17 DIAGNOSIS — K649 Unspecified hemorrhoids: Secondary | ICD-10-CM | POA: Diagnosis not present

## 2023-12-17 DIAGNOSIS — M549 Dorsalgia, unspecified: Secondary | ICD-10-CM | POA: Diagnosis not present

## 2023-12-17 DIAGNOSIS — F39 Unspecified mood [affective] disorder: Secondary | ICD-10-CM | POA: Diagnosis not present

## 2023-12-17 DIAGNOSIS — Z79899 Other long term (current) drug therapy: Secondary | ICD-10-CM

## 2023-12-17 DIAGNOSIS — Z6833 Body mass index (BMI) 33.0-33.9, adult: Secondary | ICD-10-CM | POA: Diagnosis not present

## 2023-12-17 DIAGNOSIS — M818 Other osteoporosis without current pathological fracture: Secondary | ICD-10-CM | POA: Diagnosis not present

## 2023-12-17 DIAGNOSIS — R140 Abdominal distension (gaseous): Secondary | ICD-10-CM | POA: Diagnosis not present

## 2023-12-17 DIAGNOSIS — S12290A Other displaced fracture of third cervical vertebra, initial encounter for closed fracture: Secondary | ICD-10-CM | POA: Diagnosis not present

## 2023-12-17 DIAGNOSIS — J440 Chronic obstructive pulmonary disease with acute lower respiratory infection: Secondary | ICD-10-CM | POA: Diagnosis present

## 2023-12-17 DIAGNOSIS — F17200 Nicotine dependence, unspecified, uncomplicated: Secondary | ICD-10-CM | POA: Diagnosis not present

## 2023-12-17 DIAGNOSIS — I10 Essential (primary) hypertension: Secondary | ICD-10-CM | POA: Diagnosis present

## 2023-12-17 DIAGNOSIS — Z743 Need for continuous supervision: Secondary | ICD-10-CM | POA: Diagnosis not present

## 2023-12-17 DIAGNOSIS — D696 Thrombocytopenia, unspecified: Secondary | ICD-10-CM | POA: Diagnosis present

## 2023-12-17 DIAGNOSIS — Z7189 Other specified counseling: Secondary | ICD-10-CM | POA: Diagnosis not present

## 2023-12-17 DIAGNOSIS — G8929 Other chronic pain: Secondary | ICD-10-CM | POA: Diagnosis not present

## 2023-12-17 MED ORDER — ONDANSETRON HCL 4 MG/2ML IJ SOLN
4.0000 mg | Freq: Once | INTRAMUSCULAR | Status: AC
  Administered 2023-12-18: 4 mg via INTRAVENOUS
  Filled 2023-12-17: qty 2

## 2023-12-17 MED ORDER — FENTANYL CITRATE PF 50 MCG/ML IJ SOSY
50.0000 ug | PREFILLED_SYRINGE | Freq: Once | INTRAMUSCULAR | Status: AC
  Administered 2023-12-18: 50 ug via INTRAVENOUS
  Filled 2023-12-17: qty 1

## 2023-12-17 MED ORDER — IBUPROFEN 400 MG PO TABS
400.0000 mg | ORAL_TABLET | Freq: Once | ORAL | Status: AC | PRN
  Administered 2023-12-17: 400 mg via ORAL
  Filled 2023-12-17: qty 1

## 2023-12-17 NOTE — ED Provider Notes (Signed)
 [facility] Provider Note    Event Date/Time   First MD Initiated Contact with Patient 12/17/23 2313     (approximate)   History   Fall   HPI  April Soto is a 63 y.o. female with history of fibromyalgia, chronic pain, bipolar disorder, anxiety, hypertension, hyperlipidemia, hypothyroidism, prior stroke on aspirin  who presents to the emergency department who presents to the emergency department after she slipped and fell.  Complaining of left rib pain, mid back pain, right hip pain, neck pain.  Family reports she did hit her head.  She is not sure if she lost consciousness.  States she fell trying to get off the toilet because the toilet seat was broken.  She denies any preceding symptoms that led to her fall.  Patient's oxygen saturation was 90% on room air on arrival.  She does not wear oxygen chronically.  She denies any shortness of breath.  No recent fevers, cough.   History provided by patient, niece.    Past Medical History:  Diagnosis Date   Anemia    years ago   Anxiety    takes Ativan  daily   Asthma    Bipolar 1 disorder (HCC)    Chronic back pain    HNP   Constipation    takes Ra Col Rite daily   Degenerative disc disease    Depression    was on Prozac  but taken off by MD for a couple of weeks   Depression    Fibromyalgia    takes Lyrica  daily   Genital herpes    GERD (gastroesophageal reflux disease)    takes Nexium  daily   Headache(784.0)    Heart murmur    grew out of   History of bronchitis    last time 15+yrs ago   History of rheumatic fever    Hyperlipidemia    takes Simvastatin  daily   Hypertension    takes Coreg  daily   Hypothyroidism    takes Synthroid  daily   Joint pain    Muscle spasm    takes Flexeril  daily as needed   Nocturia    Osteoarthritis    Peripheral edema    takes HCTZ daily   Pneumonia    hx of;last time 30+yrs ago   Raynaud's disease    Rheumatic fever    as a child   Seasonal  allergies    takes Zyrtec  daily and uses Dymista as well   Stroke [facility])    ? 5 years ago. No lasting deficits   Urinary incontinence    Weakness    nmbness and tingling-right leg    Past Surgical History:  Procedure Laterality Date   ABDOMINAL HYSTERECTOMY     BACK SURGERY     CHOLECYSTECTOMY     COLONOSCOPY     ESOPHAGOGASTRODUODENOSCOPY     FOOT SURGERY Right 12/2013   ankle, heel and top of foot   LUMBAR LAMINECTOMY/DECOMPRESSION MICRODISCECTOMY Right 01/14/2013   Procedure: RIGHT LUMBAR FOUR FIVE  LAMINECTOMY/DECOMPRESSION MICRODISCECTOMY 1 LEVEL;  Surgeon: Darina MALVA Boehringer, MD;  Location: [facility];  Service: Neurosurgery;  Laterality: Right;  RIGHT L45 microdiskectomy   OTHER SURGICAL HISTORY     Gastric stapling for weight loss. patient reports done in 1980's   SEPTOPLASTY     TONSILLECTOMY     TUBAL LIGATION      MEDICATIONS:  Prior to Admission medications   Medication Sig Start Date End Date Taking? Authorizing Provider  acetaminophen  (TYLENOL )  325 MG tablet Take 2 tablets (650 mg total) by mouth every 6 (six) hours as needed for mild pain (pain score 1-3), moderate pain (pain score 4-6), fever or headache (or Fever >/= 101). 08/25/23   Jens Durand, MD  acyclovir  (ZOVIRAX ) 400 MG tablet TAKE 1 TABLET BY MOUTH DAILY 01/23/23   Antonette Angeline ORN, NP  albuterol  (VENTOLIN  HFA) 108 (90 Base) MCG/ACT inhaler Inhale 2 puffs into the lungs every 4 (four) hours as needed for wheezing or shortness of breath.     [provider]  aspirin  EC 81 MG tablet Take 1 tablet (81 mg total) by mouth daily. Swallow whole. 07/03/22   Antonette Angeline ORN, NP  budesonide -formoterol  (SYMBICORT ) 160-4.5 MCG/ACT inhaler Inhale 2 puffs into the lungs daily. 08/25/23   Jens Durand, MD  cetirizine  (ZYRTEC ) 10 MG tablet Take 1 tablet (10 mg total) by mouth daily. 12/17/21   Antonette Angeline ORN, NP  diclofenac  (VOLTAREN ) 75 MG EC tablet Take 1 tablet (75 mg total) by mouth 2 (two) times daily as needed  for moderate pain (pain score 4-6). 08/25/23   Jens Durand, MD  esomeprazole  (NEXIUM ) 40 MG capsule TAKE ONE CAPSULE (40 MG TOTAL) BY MOUTH DAILY. 07/31/23   Antonette Angeline ORN, NP  fluticasone  (FLONASE ) 50 MCG/ACT nasal spray INSTILL ONE SPRAY INTO EACH NOSTRIL DAILY AS NEEDED FOR SEASONAL ALLERGY SYMPTOMS 07/01/23   Antonette Angeline ORN, NP  hydrocortisone  (ANUSOL -HC) 2.5 % rectal cream Apply 1 application topically as needed for hemorrhoids.     [provider]  metoprolol  succinate (TOPROL -XL) 50 MG 24 hr tablet TAKE 1 TABLET BY MOUTH EVERY NIGHT AT   BEDTIME 05/15/23   Antonette Angeline ORN, NP  morphine  (MS CONTIN ) 15 MG 12 hr tablet Take 1 tablet (15 mg total) by mouth 3 (three) times daily. 08/25/23   Jens Durand, MD  MOVANTIK 25 MG TABS tablet Take 25 mg by mouth daily. 07/19/20   [provider]  nicotine  (NICODERM CQ  - DOSED IN MG/24 HOURS) 21 mg/24hr patch Place 1 patch (21 mg total) onto the skin daily. 08/26/23   Jens Durand, MD  Olopatadine  HCl 0.2 % SOLN Place 1 drop into both eyes at bedtime as needed (allergies).    [provider]  omega-3 acid ethyl esters (LOVAZA ) 1 g capsule TAKE 2 CAPSULES BY MOUTH TWICE A DAY 05/15/23   Antonette Angeline ORN, NP  ondansetron  (ZOFRAN -ODT) 4 MG disintegrating tablet Take 1 tablet (4 mg total) by mouth every 8 (eight) hours as needed for nausea or vomiting. 05/10/21   Viviann Pastor, MD  Oxycodone  HCl 10 MG TABS Take 1 tablet (10 mg total) by mouth every 6 (six) hours as needed. 08/25/23   Jens Durand, MD  pregabalin  (LYRICA ) 100 MG capsule Take 100 mg by mouth 3 (three) times daily. 04/16/21   [provider]  rosuvastatin  (CRESTOR ) 40 MG tablet TAKE 1 TABLET BY MOUTH ONCE A DAY 04/15/23   Antonette Angeline ORN, NP  SYNTHROID  88 MCG tablet TAKE 1 TABLET BY MOUTH ONCE A DAY BEFORE BREAKFAST 05/15/23   Antonette Angeline ORN, NP  venlafaxine  XR (EFFEXOR -XR) 150 MG 24 hr capsule Take 1 capsule (150 mg total) by mouth every evening. For mood  control 08/21/17   Money, Caron B, FNP  ziprasidone  (GEODON ) 40 MG capsule TAKE 1 CAPSULE BY MOUTH EVERY EVENING WITH FOOD FOR MOOD 10/01/20   Antonette Angeline ORN, NP    Physical Exam   Triage Vital Signs: ED Triage Vitals  Encounter Vitals Group     BP 12/17/23 2044 121/88     Girls Systolic BP Percentile --      Girls Diastolic BP Percentile --      Boys Systolic BP Percentile --      Boys Diastolic BP Percentile --      Pulse Rate 12/17/23 2044 (!) 101     Resp 12/17/23 2044 20     Temp 12/17/23 2044 98.2 F (36.8 C)     Temp Source 12/17/23 2044 Oral     SpO2 12/17/23 2013 90 %     Weight 12/17/23 2036 207 lb (93.9 kg)     Height 12/17/23 2036 5' 6 (1.676 m)     Head Circumference --      Peak Flow --      Pain Score 12/17/23 2036 8     Pain Loc --      Pain Education --      Exclude from Growth Chart --     Most recent vital signs: Vitals:   12/17/23 2345 12/18/23 0100  BP: 139/87 107/78  Pulse: 97 96  Resp:    Temp:    SpO2: 97% 95%     CONSTITUTIONAL: Alert, responds appropriately to questions.  Obese, chronically ill-appearing HEAD: Normocephalic; atraumatic EYES: Conjunctivae clear, PERRL, EOMI ENT: normal nose; no rhinorrhea; moist mucous membranes; pharynx without lesions noted; no dental injury; no septal hematoma, no epistaxis; no facial deformity or bony tenderness NECK: Supple, no midline spinal tenderness, step-off or deformity; trachea midline CARD: RRR; S1 and S2 appreciated; no murmurs, no clicks, no rubs, no gallops RESP: Normal chest excursion without splinting or tachypnea; breath sounds clear and equal bilaterally; no wheezes, no rhonchi, no rales; no hypoxia or respiratory distress CHEST:  chest wall stable, no crepitus or ecchymosis or deformity, nontender to palpation; no flail chest ABD/GI: Non-distended; soft, non-tender, no rebound, no guarding; no ecchymosis or other lesions noted PELVIS:  stable, nontender to palpation BACK:  The back  appears normal; tender over the thoracic spine without step-off or deformity EXT: Tender over the right hip, no leg length discrepancy, signs of chronic venous stasis dermatitis bilaterally, no calf tenderness or calf swelling, compartments soft SKIN: Normal color for age and race; warm NEURO: No facial asymmetry, normal speech, moving all extremities equally  ED Results / Procedures / Treatments   LABS: (all labs ordered are listed, but only abnormal results are displayed) Labs Reviewed  CBC WITH DIFFERENTIAL/PLATELET - Abnormal; Notable for the following components:      Result Value   Hemoglobin 15.5 (*)    HCT 48.0 (*)    Platelets 121 (*)    Monocytes Absolute 1.1 (*)    All other components within normal limits  COMPREHENSIVE METABOLIC PANEL WITH GFR - Abnormal; Notable for the following components:   Glucose, Bld 123 (*)    BUN 7 (*)    All other components within normal limits  CULTURE, BLOOD (ROUTINE X 2)  CULTURE, BLOOD (ROUTINE X 2)  RESP PANEL BY RT-PCR (RSV, FLU A&B, COVID)  [id number]  LACTIC ACID, PLASMA  URINALYSIS, W/ REFLEX TO CULTURE (INFECTION SUSPECTED)  PROCALCITONIN     EKG:    RADIOLOGY: My personal review and interpretation of imaging: CT chest shows T12 compression fracture and possible development of left upper lobe pneumonia.  CT head and neck unremarkable.  CT head shows no acute abnormality.  I have personally reviewed all radiology reports. CT Chest Wo Contrast Result  Date: 12/17/2023 CLINICAL DATA:  Chest trauma, blunt.  Fall EXAM: CT CHEST WITHOUT CONTRAST TECHNIQUE: Multidetector CT imaging of the chest was performed following the standard protocol without IV contrast. RADIATION DOSE REDUCTION: This exam was performed according to the departmental dose-optimization program which includes automated exposure control, adjustment of the mA and/or kV according to patient size and/or use of iterative reconstruction technique. COMPARISON:  CT angio chest  03/14/2022 FINDINGS: Cardiovascular: The thoracic aorta is normal in caliber. The heart is normal in size. No significant pericardial effusion. Mild atherosclerotic plaque. Left anterior descending coronary calcification. Lungs/Pleura: Lower bilateral axis, lingular atelectasis. Diffuse bronchial wall thickening. Trace consolidation along the left upper lobe (4:64). No pulmonary nodule. No pulmonary mass. No pulmonary contusion or laceration. No pneumatocele formation. No pleural effusion. No pneumothorax. No hemothorax. Mediastinum/Nodes: No pneumomediastinum. The central airways are patent. The esophagus is unremarkable. The thyroid  is unremarkable. Limited evaluation for hilar lymphadenopathy on this noncontrast study. No mediastinal or axillary lymphadenopathy. Musculoskeletal/Chest wall No chest wall mass. No acute rib or sternal fracture. Interval development of age-indeterminate T12 40% vertebral body height loss. Upper Abdomen: No acute abnormality. Status post cholecystectomy. Gastric surgical changes. Stable 1.5 cm right adrenal gland nodule with a density of -5 Hounsfield units- consistent with lipid-rich benign adenoma. No follow-up imaging is recommended. IMPRESSION: 1. No acute intrathoracic traumatic injury with limited evaluation on this noncontrast study. 2. Interval development of age-indeterminate T12 40% vertebral body height loss. Correlate with point tenderness to palpation to evaluate for an acute compression fracture component. 3. Trace left upper lobe consolidation that may represent developing infection/inflammation. Followup PA and lateral chest X-ray is recommended in 3-4 weeks following therapy to ensure resolution. 4. Aortic Atherosclerosis (ICD10-I70.0) including left anterior descending coronary calcification. Electronically Signed   By: Morgane  Naveau M.D.   On: 12/17/2023 23:36   CT HEAD WO CONTRAST ( ) Result Date: 12/17/2023 CLINICAL DATA:  Head trauma, moderate-severe; Neck  trauma, dangerous injury mechanism (Age 66-64y) EXAM: CT HEAD WITHOUT CONTRAST CT CERVICAL SPINE WITHOUT CONTRAST TECHNIQUE: Multidetector CT imaging of the head and cervical spine was performed following the standard protocol without intravenous contrast. Multiplanar CT image reconstructions of the cervical spine were also generated. RADIATION DOSE REDUCTION: This exam was performed according to the departmental dose-optimization program which includes automated exposure control, adjustment of the mA and/or kV according to patient size and/or use of iterative reconstruction technique. COMPARISON:  CT head 08/20/2023, CT C-spine 08/20/2023 FINDINGS: CT HEAD FINDINGS Brain: Patchy and confluent areas of decreased attenuation are noted throughout the deep and periventricular white matter of the cerebral hemispheres bilaterally, compatible with chronic microvascular ischemic disease. No evidence of large-territorial acute infarction. No parenchymal hemorrhage. No mass lesion. No extra-axial collection. No mass effect or midline shift. No hydrocephalus. Basilar cisterns are patent. Vascular: No hyperdense vessel. Skull: No acute fracture or focal lesion. Sinuses/Orbits: Paranasal sinuses and mastoid air cells are clear. The orbits are unremarkable. Other: None. CT CERVICAL SPINE FINDINGS Alignment: Normal. Skull base and vertebrae: Multilevel moderate degenerative changes spine most prominent at the C4-C7 levels. No associated severe osseous neural foraminal or central canal stenosis. No acute fracture. No aggressive appearing focal osseous lesion or focal pathologic process. Soft tissues and spinal canal: No prevertebral fluid or swelling. No visible canal hematoma. Upper chest: Diffuse bronchial wall thickening. Please see separately dictated CT chest 12/17/2023. Other: Patient is edentulous. Atherosclerotic plaque of the aortic arch and its main branches. IMPRESSION: 1. No acute intracranial abnormality. 2. No acute  displaced fracture or traumatic listhesis of the cervical spine. 3.  Aortic Atherosclerosis (ICD10-I70.0). Electronically Signed   By: Morgane  Naveau M.D.   On: 12/17/2023 23:28   CT Cervical Spine Wo Contrast Result Date: 12/17/2023 CLINICAL DATA:  Head trauma, moderate-severe; Neck trauma, dangerous injury mechanism (Age 69-64y) EXAM: CT HEAD WITHOUT CONTRAST CT CERVICAL SPINE WITHOUT CONTRAST TECHNIQUE: Multidetector CT imaging of the head and cervical spine was performed following the standard protocol without intravenous contrast. Multiplanar CT image reconstructions of the cervical spine were also generated. RADIATION DOSE REDUCTION: This exam was performed according to the departmental dose-optimization program which includes automated exposure control, adjustment of the mA and/or kV according to patient size and/or use of iterative reconstruction technique. COMPARISON:  CT head 08/20/2023, CT C-spine 08/20/2023 FINDINGS: CT HEAD FINDINGS Brain: Patchy and confluent areas of decreased attenuation are noted throughout the deep and periventricular white matter of the cerebral hemispheres bilaterally, compatible with chronic microvascular ischemic disease. No evidence of large-territorial acute infarction. No parenchymal hemorrhage. No mass lesion. No extra-axial collection. No mass effect or midline shift. No hydrocephalus. Basilar cisterns are patent. Vascular: No hyperdense vessel. Skull: No acute fracture or focal lesion. Sinuses/Orbits: Paranasal sinuses and mastoid air cells are clear. The orbits are unremarkable. Other: None. CT CERVICAL SPINE FINDINGS Alignment: Normal. Skull base and vertebrae: Multilevel moderate degenerative changes spine most prominent at the C4-C7 levels. No associated severe osseous neural foraminal or central canal stenosis. No acute fracture. No aggressive appearing focal osseous lesion or focal pathologic process. Soft tissues and spinal canal: No prevertebral fluid or  swelling. No visible canal hematoma. Upper chest: Diffuse bronchial wall thickening. Please see separately dictated CT chest 12/17/2023. Other: Patient is edentulous. Atherosclerotic plaque of the aortic arch and its main branches. IMPRESSION: 1. No acute intracranial abnormality. 2. No acute displaced fracture or traumatic listhesis of the cervical spine. 3.  Aortic Atherosclerosis (ICD10-I70.0). Electronically Signed   By: Morgane  Naveau M.D.   On: 12/17/2023 23:28   CT Hip Right Wo Contrast Result Date: 12/17/2023 CLINICAL DATA:  Hip trauma, fracture suspected, xray done EXAM: CT OF THE RIGHT HIP WITHOUT CONTRAST TECHNIQUE: Multidetector CT imaging of the right hip was performed according to the standard protocol. Multiplanar CT image reconstructions were also generated. RADIATION DOSE REDUCTION: This exam was performed according to the departmental dose-optimization program which includes automated exposure control, adjustment of the mA and/or kV according to patient size and/or use of iterative reconstruction technique. COMPARISON:  X-ray right hip 12/17/2023 FINDINGS: Bones/Joint/Cartilage No evidence of fracture, dislocation, or joint effusion. Severe degenerative changes of the right hip with almost complete loss of joint space, subchondral cystic changes and sclerosis. No aggressive appearing focal bone abnormality. Ligaments Suboptimally assessed by CT. Muscles and Tendons Grossly unremarkable. Soft tissues Unremarkable. Other: Colonic diverticulosis. IMPRESSION: 1. Severe degenerative changes of the right hip. 2.  Negative for acute traumatic injury. Electronically Signed   By: Morgane  Naveau M.D.   On: 12/17/2023 23:25   DG Ribs Unilateral W/Chest Left Result Date: 12/17/2023 CLINICAL DATA:  injury  fall EXAM: LEFT RIBS AND CHEST - 3+ VIEW COMPARISON:  None Available. FINDINGS: The heart and mediastinal contours are within normal limits. No focal consolidation. No pulmonary edema. No pleural  effusion. No pneumothorax. No acute displaced fracture or other bone lesions are seen involving the left ribs. Bowel staples overlie the left upper quadrant. IMPRESSION: 1. No acute displaced left rib fracture. Please note, nondisplaced rib fractures may be occult on radiograph.  2. No acute cardiopulmonary abnormality. Electronically Signed   By: Morgane  Naveau M.D.   On: 12/17/2023 21:12   DG Hip Unilat W or Wo Pelvis 2-3 Views Right Result Date: 12/17/2023 CLINICAL DATA:  Fall, right hip pain EXAM: DG HIP (WITH OR WITHOUT PELVIS) 2-3V RIGHT COMPARISON:  None Available. FINDINGS: Normal alignment. No acute fracture or dislocation. Severe right hip degenerative arthritis with near complete loss of the joint space and extensive osteophyte formation. Mild left hip degenerative arthritis. L4-5 fusion procedure with instrumentation noted. Soft tissues are unremarkable. IMPRESSION: 1. Severe right hip degenerative arthritis. No acute fracture or dislocation. Electronically Signed   By: Dorethia Molt M.D.   On: 12/17/2023 21:08     PROCEDURES:  Critical Care performed: Yes, see critical care procedure note(s)   CRITICAL CARE Performed by: Josette Ranvir Renovato   Total critical care time: 30 minutes  Critical care time was exclusive of separately billable procedures and treating other patients.  Critical care was necessary to treat or prevent imminent or life-threatening deterioration.  Critical care was time spent personally by me on the following activities: development of treatment plan with patient and/or surrogate as well as nursing, discussions with consultants, evaluation of patient's response to treatment, examination of patient, obtaining history from patient or surrogate, ordering and performing treatments and interventions, ordering and review of laboratory studies, ordering and review of radiographic studies, pulse oximetry and re-evaluation of patient's condition.   SABRA1-3 Lead EKG  Interpretation  Performed by: Allon Costlow, Josette SAILOR, DO Authorized by: Ahava Kissoon, Josette SAILOR, DO     Interpretation: normal     ECG rate:  97   ECG rate assessment: normal     Rhythm: sinus rhythm     Ectopy: none     Conduction: normal       IMPRESSION / MDM / ASSESSMENT AND PLAN / ED COURSE  I reviewed the triage vital signs and the nursing notes.  Patient here after mechanical fall.  Borderline hypoxia on arrival.  The patient is on the cardiac monitor to evaluate for evidence of arrhythmia and/or significant heart rate changes.   DIFFERENTIAL DIAGNOSIS (includes but not limited to):   Intracranial hemorrhage, spinal fracture, rib fracture, pneumothorax, pulmonary contusion, pneumonia, viral URI, hip fracture, deconditioning, UTI, less likely ACS, PE  Patient's presentation is most consistent with acute presentation with potential threat to life or bodily function.  PLAN: X-rays reviewed and interpreted by myself and the radiologist and show no acute traumatic injury.  Will obtain CT scans, labs, urine.  Will give pain medication.  Borderline hypoxic on arrival at 90%.  Does not wear oxygen chronically.  Taken off oxygen and is currently satting in the mid 90s.  Will continue to monitor.   MEDICATIONS GIVEN IN ED: Medications  cefTRIAXone  (ROCEPHIN ) 2 g in sodium chloride  0.9 % 100 mL IVPB (has no administration in time range)  azithromycin  (ZITHROMAX ) 500 mg in sodium chloride  0.9 % 250 mL IVPB (has no administration in time range)  ibuprofen  (ADVIL ) tablet 400 mg (400 mg Oral Given 12/17/23 2142)  fentaNYL  (SUBLIMAZE ) injection 50 mcg (50 mcg Intravenous Given 12/18/23 0107)  ondansetron  (ZOFRAN ) injection 4 mg (4 mg Intravenous Given 12/18/23 0106)     ED COURSE: CT scans reviewed and interpreted by myself and the radiologist.  Patient appears to have an acute T12 compression fracture with loss of about 40% of height.  No focal neurodeficits on exam.  Normal sensation and moving all  extremities.  Gait testing  deferred however due to pain.  Will place her in a TLSO brace.  Discussed going home with close outpatient follow-up and PT for home versus admission for pain control, physical therapy and potential rehab placement.  Patient does not feel like she will be able to go home as she has not been able to stand since she fell.  She lives at home alone and uses a walker at baseline.  CT of her chest also shows possible developing left upper lobe pneumonia.  This could also be a pulmonary contusion.  Given her oxygen saturation of 90% on arrival, will admit for observation.  Will go ahead and give her antibiotics to cover for community-acquired pneumonia.  COVID and flu swab pending.  Labs show no leukocytosis.  Normal glucose.  Lactic negative.   CONSULTS: Dr. Clois with neurosurgery aware and will see patient in consult tomorrow.  Consulted and discussed patient's case with hospitalist, Dr. Lawence.  I have recommended admission and consulting physician agrees and will place admission orders.  Patient (and family if present) agree with this plan.   I reviewed all nursing notes, vitals, pertinent previous records.  All labs, EKGs, imaging ordered have been independently reviewed and interpreted by myself.    OUTSIDE RECORDS REVIEWED: Reviewed recent PCP notes.  Patient also admitted in May 2025 for hypoxia.       FINAL CLINICAL IMPRESSION(S) / ED DIAGNOSES   Final diagnoses:  Fall, initial encounter  T12 compression fracture, initial encounter (HCC)  Community acquired pneumonia, unspecified laterality  Acute respiratory failure with hypoxia (HCC)     Rx / DC Orders   ED Discharge Orders     None        Note:  This document was prepared using Dragon voice recognition software and may include unintentional dictation errors.   Sheyli Horwitz, Josette SAILOR, DO 12/18/23 [phone]

## 2023-12-17 NOTE — ED Triage Notes (Signed)
 Pt arrives via EMS from local hotel; slipped in BR and fell, c/o rib/back and hip pain

## 2023-12-17 NOTE — ED Notes (Signed)
 Niece, Lucie, who is in the room with the patient asked to talk to me.  She stated that patient will be coming to live with her full time and will not be left alone.  She will have a safe place to live and wanted to let us  know that. I advised I would let the provider know.

## 2023-12-17 NOTE — ED Triage Notes (Signed)
 Pt brought in via ems from an apartment.   Pt reports she fell in the bathroom today.  Pt has right hip pain.  Pt has left rib and back pain.  No loc.  No vomiting.  Pt also has neck pain.  Pt alert

## 2023-12-18 ENCOUNTER — Inpatient Hospital Stay (HOSPITAL_COMMUNITY)
Admit: 2023-12-18 | Discharge: 2023-12-18 | Disposition: A | Discharge status: Home / Self Care | Attending: Nurse Practitioner | Admitting: Nurse Practitioner

## 2023-12-18 ENCOUNTER — Other Ambulatory Visit: Payer: Self-pay

## 2023-12-18 DIAGNOSIS — Z1152 Encounter for screening for COVID-19: Secondary | ICD-10-CM | POA: Diagnosis not present

## 2023-12-18 DIAGNOSIS — I10 Essential (primary) hypertension: Secondary | ICD-10-CM | POA: Diagnosis present

## 2023-12-18 DIAGNOSIS — J9601 Acute respiratory failure with hypoxia: Secondary | ICD-10-CM | POA: Diagnosis present

## 2023-12-18 DIAGNOSIS — R54 Age-related physical debility: Secondary | ICD-10-CM | POA: Diagnosis present

## 2023-12-18 DIAGNOSIS — Z6834 Body mass index (BMI) 34.0-34.9, adult: Secondary | ICD-10-CM | POA: Diagnosis not present

## 2023-12-18 DIAGNOSIS — S22080A Wedge compression fracture of T11-T12 vertebra, initial encounter for closed fracture: Secondary | ICD-10-CM | POA: Diagnosis not present

## 2023-12-18 DIAGNOSIS — Z833 Family history of diabetes mellitus: Secondary | ICD-10-CM | POA: Diagnosis not present

## 2023-12-18 DIAGNOSIS — Z7982 Long term (current) use of aspirin: Secondary | ICD-10-CM | POA: Diagnosis not present

## 2023-12-18 DIAGNOSIS — Z79890 Hormone replacement therapy (postmenopausal): Secondary | ICD-10-CM | POA: Diagnosis not present

## 2023-12-18 DIAGNOSIS — R52 Pain, unspecified: Secondary | ICD-10-CM | POA: Diagnosis not present

## 2023-12-18 DIAGNOSIS — R0609 Other forms of dyspnea: Secondary | ICD-10-CM

## 2023-12-18 DIAGNOSIS — W010XXA Fall on same level from slipping, tripping and stumbling without subsequent striking against object, initial encounter: Secondary | ICD-10-CM | POA: Diagnosis present

## 2023-12-18 DIAGNOSIS — S12290A Other displaced fracture of third cervical vertebra, initial encounter for closed fracture: Secondary | ICD-10-CM

## 2023-12-18 DIAGNOSIS — M4850XA Collapsed vertebra, not elsewhere classified, site unspecified, initial encounter for fracture: Secondary | ICD-10-CM | POA: Diagnosis present

## 2023-12-18 DIAGNOSIS — E66811 Obesity, class 1: Secondary | ICD-10-CM | POA: Diagnosis present

## 2023-12-18 DIAGNOSIS — M797 Fibromyalgia: Secondary | ICD-10-CM | POA: Diagnosis present

## 2023-12-18 DIAGNOSIS — Z23 Encounter for immunization: Secondary | ICD-10-CM | POA: Diagnosis present

## 2023-12-18 DIAGNOSIS — Z7951 Long term (current) use of inhaled steroids: Secondary | ICD-10-CM | POA: Diagnosis not present

## 2023-12-18 DIAGNOSIS — E785 Hyperlipidemia, unspecified: Secondary | ICD-10-CM | POA: Diagnosis present

## 2023-12-18 DIAGNOSIS — M4854XA Collapsed vertebra, not elsewhere classified, thoracic region, initial encounter for fracture: Secondary | ICD-10-CM | POA: Diagnosis present

## 2023-12-18 DIAGNOSIS — R7881 Bacteremia: Secondary | ICD-10-CM | POA: Diagnosis present

## 2023-12-18 DIAGNOSIS — G8929 Other chronic pain: Secondary | ICD-10-CM | POA: Diagnosis not present

## 2023-12-18 DIAGNOSIS — R2242 Localized swelling, mass and lump, left lower limb: Secondary | ICD-10-CM | POA: Diagnosis not present

## 2023-12-18 DIAGNOSIS — F32A Depression, unspecified: Secondary | ICD-10-CM | POA: Diagnosis present

## 2023-12-18 DIAGNOSIS — B957 Other staphylococcus as the cause of diseases classified elsewhere: Secondary | ICD-10-CM | POA: Diagnosis present

## 2023-12-18 DIAGNOSIS — Z8249 Family history of ischemic heart disease and other diseases of the circulatory system: Secondary | ICD-10-CM | POA: Diagnosis not present

## 2023-12-18 DIAGNOSIS — W19XXXA Unspecified fall, initial encounter: Secondary | ICD-10-CM | POA: Diagnosis not present

## 2023-12-18 DIAGNOSIS — J189 Pneumonia, unspecified organism: Secondary | ICD-10-CM | POA: Diagnosis present

## 2023-12-18 DIAGNOSIS — Z7189 Other specified counseling: Secondary | ICD-10-CM | POA: Diagnosis not present

## 2023-12-18 DIAGNOSIS — Z515 Encounter for palliative care: Secondary | ICD-10-CM | POA: Diagnosis not present

## 2023-12-18 DIAGNOSIS — D696 Thrombocytopenia, unspecified: Secondary | ICD-10-CM | POA: Diagnosis present

## 2023-12-18 DIAGNOSIS — S22080D Wedge compression fracture of T11-T12 vertebra, subsequent encounter for fracture with routine healing: Secondary | ICD-10-CM | POA: Diagnosis not present

## 2023-12-18 DIAGNOSIS — E039 Hypothyroidism, unspecified: Secondary | ICD-10-CM | POA: Diagnosis present

## 2023-12-18 DIAGNOSIS — G894 Chronic pain syndrome: Secondary | ICD-10-CM | POA: Diagnosis present

## 2023-12-18 DIAGNOSIS — H814 Vertigo of central origin: Secondary | ICD-10-CM | POA: Diagnosis not present

## 2023-12-18 DIAGNOSIS — S22080S Wedge compression fracture of T11-T12 vertebra, sequela: Secondary | ICD-10-CM | POA: Diagnosis not present

## 2023-12-18 DIAGNOSIS — J440 Chronic obstructive pulmonary disease with acute lower respiratory infection: Secondary | ICD-10-CM | POA: Diagnosis present

## 2023-12-18 DIAGNOSIS — S22030A Wedge compression fracture of third thoracic vertebra, initial encounter for closed fracture: Secondary | ICD-10-CM | POA: Diagnosis not present

## 2023-12-18 LAB — URINALYSIS, W/ REFLEX TO CULTURE (INFECTION SUSPECTED)
Bilirubin Urine: NEGATIVE
Glucose, UA: NEGATIVE mg/dL
Ketones, ur: NEGATIVE mg/dL
Leukocytes,Ua: NEGATIVE
Nitrite: NEGATIVE
Protein, ur: NEGATIVE mg/dL
Specific Gravity, Urine: 1.011 (ref 1.005–1.030)
pH: 5 (ref 5.0–8.0)

## 2023-12-18 LAB — BLOOD CULTURE ID PANEL (REFLEXED) - BCID2

## 2023-12-18 LAB — CBC WITH DIFFERENTIAL/PLATELET
Abs Immature Granulocytes: 0.03 K/uL (ref 0.00–0.07)
Basophils Absolute: 0 K/uL (ref 0.0–0.1)
Basophils Relative: 0 %
Eosinophils Absolute: 0.1 K/uL (ref 0.0–0.5)
Eosinophils Relative: 1 %
HCT: 48 % — ABNORMAL HIGH (ref 36.0–46.0)
Hemoglobin: 15.5 g/dL — ABNORMAL HIGH (ref 12.0–15.0)
Immature Granulocytes: 0 %
Lymphocytes Relative: 21 %
Lymphs Abs: 2.2 K/uL (ref 0.7–4.0)
MCH: 30.6 pg (ref 26.0–34.0)
MCHC: 32.3 g/dL (ref 30.0–36.0)
MCV: 94.9 fL (ref 80.0–100.0)
Monocytes Absolute: 1.1 K/uL — ABNORMAL HIGH (ref 0.1–1.0)
Monocytes Relative: 10 %
Neutro Abs: 6.9 K/uL (ref 1.7–7.7)
Neutrophils Relative %: 68 %
Platelets: 121 K/uL — ABNORMAL LOW (ref 150–400)
RBC: 5.06 MIL/uL (ref 3.87–5.11)
RDW: 14.2 % (ref 11.5–15.5)
WBC: 10.2 K/uL (ref 4.0–10.5)
nRBC: 0 % (ref 0.0–0.2)

## 2023-12-18 LAB — ECHOCARDIOGRAM COMPLETE
AV Mean grad: 4 mmHg
AV Peak grad: 7.1 mmHg
Ao pk vel: 1.33 m/s
Area-P 1/2: 6.32 cm2
Height: 66 in
S' Lateral: 2.7 cm
Single Plane A2C EF: 80.3 %
Weight: 3312 [oz_av]

## 2023-12-18 LAB — COMPREHENSIVE METABOLIC PANEL WITH GFR
ALT: 27 U/L (ref 0–44)
AST: 34 U/L (ref 15–41)
Albumin: 3.7 g/dL (ref 3.5–5.0)
Alkaline Phosphatase: 76 U/L (ref 38–126)
Anion gap: 14 (ref 5–15)
BUN: 7 mg/dL — ABNORMAL LOW (ref 8–23)
CO2: 26 mmol/L (ref 22–32)
Calcium: 9 mg/dL (ref 8.9–10.3)
Chloride: 101 mmol/L (ref 98–111)
Creatinine, Ser: 0.72 mg/dL (ref 0.44–1.00)
GFR, Estimated: >60 mL/min (ref >60)
Glucose, Bld: 123 mg/dL — ABNORMAL HIGH (ref 70–99)
Potassium: 4.2 mmol/L (ref 3.5–5.1)
Sodium: 141 mmol/L (ref 135–145)
Total Bilirubin: 1.1 mg/dL (ref 0.0–1.2)
Total Protein: 6.9 g/dL (ref 6.5–8.1)

## 2023-12-18 LAB — RESP PANEL BY RT-PCR (RSV, FLU A&B, COVID)  RVPGX2
Influenza A by PCR: NEGATIVE
Influenza B by PCR: NEGATIVE
Resp Syncytial Virus by PCR: NEGATIVE
SARS Coronavirus 2 by RT PCR: NEGATIVE

## 2023-12-18 LAB — LACTIC ACID, PLASMA: Lactic Acid, Venous: 1.7 mmol/L (ref 0.5–1.9)

## 2023-12-18 LAB — PROCALCITONIN: Procalcitonin: <0.1 ng/mL

## 2023-12-18 LAB — BRAIN NATRIURETIC PEPTIDE: B Natriuretic Peptide: 78.3 pg/mL (ref 0.0–100.0)

## 2023-12-18 MED ORDER — SODIUM CHLORIDE 0.9 % IV SOLN
500.0000 mg | INTRAVENOUS | Status: DC
  Administered 2023-12-19 – 2023-12-20 (×2): 500 mg via INTRAVENOUS
  Filled 2023-12-18 (×2): qty 5

## 2023-12-18 MED ORDER — CALCITONIN (SALMON) 200 UNIT/ACT NA SOLN
1.0000 | Freq: Every day | NASAL | Status: DC
  Administered 2023-12-18 – 2023-12-24 (×7): 1 via NASAL
  Filled 2023-12-18: qty 3.7

## 2023-12-18 MED ORDER — FLUTICASONE FUROATE-VILANTEROL 200-25 MCG/ACT IN AEPB
1.0000 | INHALATION_SPRAY | Freq: Every day | RESPIRATORY_TRACT | Status: DC
  Administered 2023-12-18 – 2023-12-24 (×7): 1 via RESPIRATORY_TRACT
  Filled 2023-12-18: qty 28

## 2023-12-18 MED ORDER — INFLUENZA VIRUS VACC SPLIT PF (FLUZONE) 0.5 ML IM SUSY
0.5000 mL | PREFILLED_SYRINGE | INTRAMUSCULAR | Status: AC
  Administered 2023-12-19: 0.5 mL via INTRAMUSCULAR
  Filled 2023-12-18: qty 0.5

## 2023-12-18 MED ORDER — VENLAFAXINE HCL ER 75 MG PO CP24
150.0000 mg | ORAL_CAPSULE | Freq: Every evening | ORAL | Status: DC
  Administered 2023-12-18 – 2023-12-24 (×7): 150 mg via ORAL
  Filled 2023-12-18 (×7): qty 2

## 2023-12-18 MED ORDER — MORPHINE SULFATE (PF) 2 MG/ML IV SOLN
2.0000 mg | INTRAVENOUS | Status: DC | PRN
  Administered 2023-12-18 (×4): 2 mg via INTRAVENOUS
  Filled 2023-12-18 (×5): qty 1

## 2023-12-18 MED ORDER — OMEGA-3-ACID ETHYL ESTERS 1 G PO CAPS: 2.0000 | ORAL_CAPSULE | Freq: Two times a day (BID) | ORAL | Status: DC

## 2023-12-18 MED ORDER — SODIUM CHLORIDE 0.9 % IV SOLN: 1.0000 g | INTRAVENOUS | Status: DC

## 2023-12-18 MED ORDER — PREGABALIN 75 MG PO CAPS
75.0000 mg | ORAL_CAPSULE | Freq: Every day | ORAL | Status: DC
  Administered 2023-12-18 – 2023-12-23 (×6): 75 mg via ORAL
  Filled 2023-12-18 (×7): qty 1

## 2023-12-18 MED ORDER — METOPROLOL SUCCINATE ER 50 MG PO TB24
50.0000 mg | ORAL_TABLET | Freq: Every evening | ORAL | Status: DC
  Administered 2023-12-18 – 2023-12-24 (×6): 50 mg via ORAL
  Filled 2023-12-18 (×6): qty 1

## 2023-12-18 MED ORDER — ONDANSETRON HCL 4 MG/2ML IJ SOLN
4.0000 mg | Freq: Once | INTRAMUSCULAR | Status: AC
  Administered 2023-12-18: 4 mg via INTRAVENOUS
  Filled 2023-12-18: qty 2

## 2023-12-18 MED ORDER — ENOXAPARIN SODIUM 60 MG/0.6ML IJ SOSY
45.0000 mg | PREFILLED_SYRINGE | INTRAMUSCULAR | Status: DC
  Administered 2023-12-18 – 2023-12-24 (×7): 45 mg via SUBCUTANEOUS
  Filled 2023-12-18 (×7): qty 0.6

## 2023-12-18 MED ORDER — SODIUM CHLORIDE 0.9 % IV SOLN
2.0000 g | Freq: Once | INTRAVENOUS | Status: AC
  Administered 2023-12-18: 2 g via INTRAVENOUS
  Filled 2023-12-18: qty 20

## 2023-12-18 MED ORDER — OMEGA-3-ACID ETHYL ESTERS 1 G PO CAPS
2.0000 | ORAL_CAPSULE | Freq: Two times a day (BID) | ORAL | Status: DC
  Administered 2023-12-18 – 2023-12-24 (×13): 2 g via ORAL
  Filled 2023-12-18 (×13): qty 2

## 2023-12-18 MED ORDER — SODIUM CHLORIDE 0.9% FLUSH
3.0000 mL | Freq: Two times a day (BID) | INTRAVENOUS | Status: DC
  Administered 2023-12-18 – 2023-12-24 (×13): 3 mL via INTRAVENOUS

## 2023-12-18 MED ORDER — PREGABALIN 25 MG PO CAPS
25.0000 mg | ORAL_CAPSULE | Freq: Two times a day (BID) | ORAL | Status: DC
  Administered 2023-12-18 – 2023-12-24 (×14): 25 mg via ORAL
  Filled 2023-12-18 (×14): qty 1

## 2023-12-18 MED ORDER — OXYCODONE HCL 5 MG PO TABS
10.0000 mg | ORAL_TABLET | ORAL | Status: DC | PRN
  Administered 2023-12-18 – 2023-12-24 (×13): 10 mg via ORAL
  Filled 2023-12-18 (×15): qty 2

## 2023-12-18 MED ORDER — ROSUVASTATIN CALCIUM 20 MG PO TABS
40.0000 mg | ORAL_TABLET | Freq: Every day | ORAL | Status: DC
  Administered 2023-12-18 – 2023-12-24 (×7): 40 mg via ORAL
  Filled 2023-12-18: qty 4
  Filled 2023-12-18 (×6): qty 2

## 2023-12-18 MED ORDER — LEVOTHYROXINE SODIUM 88 MCG PO TABS
88.0000 ug | ORAL_TABLET | Freq: Every day | ORAL | Status: DC
  Administered 2023-12-18 – 2023-12-24 (×7): 88 ug via ORAL
  Filled 2023-12-18 (×7): qty 1

## 2023-12-18 MED ORDER — IPRATROPIUM-ALBUTEROL 0.5-2.5 (3) MG/3ML IN SOLN: 3.0000 mL | Freq: Four times a day (QID) | RESPIRATORY_TRACT | Status: DC | PRN

## 2023-12-18 MED ORDER — SODIUM CHLORIDE 0.9 % IV SOLN
500.0000 mg | Freq: Once | INTRAVENOUS | Status: AC
  Administered 2023-12-18: 500 mg via INTRAVENOUS
  Filled 2023-12-18: qty 5

## 2023-12-18 MED ORDER — FENTANYL CITRATE PF 50 MCG/ML IJ SOSY
25.0000 ug | PREFILLED_SYRINGE | INTRAMUSCULAR | Status: DC | PRN
  Administered 2023-12-18: 25 ug via INTRAVENOUS
  Filled 2023-12-18: qty 1

## 2023-12-18 MED ORDER — SODIUM CHLORIDE 0.9 % IV SOLN
2.0000 g | INTRAVENOUS | Status: DC
  Administered 2023-12-18 – 2023-12-20 (×2): 2 g via INTRAVENOUS
  Filled 2023-12-18 (×2): qty 20

## 2023-12-18 MED ORDER — MORPHINE SULFATE ER 15 MG PO TBCR: 15.0000 mg | EXTENDED_RELEASE_TABLET | Freq: Three times a day (TID) | ORAL | Status: DC

## 2023-12-18 MED ORDER — PERFLUTREN LIPID MICROSPHERE
1.0000 mL | INTRAVENOUS | Status: AC | PRN
  Administered 2023-12-18: 2 mL via INTRAVENOUS

## 2023-12-18 MED ORDER — NICOTINE 21 MG/24HR TD PT24: 21.0000 mg | MEDICATED_PATCH | Freq: Every day | TRANSDERMAL | Status: DC

## 2023-12-18 NOTE — Evaluation (Signed)
 Occupational Therapy Evaluation Patient Details Name: April Soto MRN: [id number] DOB: 27-Mar-1961 Today's Date: 12/18/2023   History of Present Illness   62yoF who comes to ED after slip in bathroom and fall with subsequent back pain. Workup revealing of age-indeterminate T12 40% vertebral body height loss consistent with acute compression fracture. Patient has been reporting significant pain. A TLSO brace was applied in the ED. PMH: asthma, tobacco abuse quit 3 months ago, chronic low back pain, major depressive disorder, GERD, dyslipidemia, hypertension, fibromyalgia and hypothyroidism.   Clinical Impressions Ms Garn was seen for OT evaluation this date. Prior to hospital admission, pt was MOD I using RW. Pt lives alone, plan to d/c to niece's house. Pt currently requires MIN A + RW bed>chair pivot t/f - cues for RW technique/safety. MOD A don/doff TLSO in sitting. MAX A don B socks sitting. Educated on back pcns and falls prevention. Pt would benefit from skilled OT to address noted impairments and functional limitations (see below for any additional details). Upon hospital discharge, recommend OT follow up <3 hours/day.     If plan is discharge home, recommend the following:   A lot of help with walking and/or transfers;A lot of help with bathing/dressing/bathroom     Functional Status Assessment   Patient has had a recent decline in their functional status and demonstrates the ability to make significant improvements in function in a reasonable and predictable amount of time.     Equipment Recommendations   BSC/3in1     Recommendations for Other Services         Precautions/Restrictions   Precautions Precautions: Fall;Back Recall of Precautions/Restrictions: Intact Required Braces or Orthoses: Spinal Brace Spinal Brace: Thoracolumbosacral orthotic Restrictions Weight Bearing Restrictions Per Provider Order: No     Mobility Bed Mobility Overal bed mobility:  Needs Assistance Bed Mobility: Sit to Supine       Sit to supine: Mod assist        Transfers Overall transfer level: Needs assistance Equipment used: Rolling walker (2 wheels) Transfers: Bed to chair/wheelchair/BSC, Sit to/from Stand Sit to Stand: Min assist     Step pivot transfers: Min assist            Balance Overall balance assessment: Needs assistance Sitting-balance support: No upper extremity supported, Feet supported Sitting balance-Leahy Scale: Fair     Standing balance support: Bilateral upper extremity supported Standing balance-Leahy Scale: Fair                             ADL either performed or assessed with clinical judgement   ADL Overall ADL's : Needs assistance/impaired                                       General ADL Comments: MOD A don/doff TLSO in sitting. MAX A don B socks sitting     Vision         Perception         Praxis         Pertinent Vitals/Pain Pain Assessment Pain Assessment: 0-10 Pain Score: 8  Pain Location: low back Pain Descriptors / Indicators: Aching, Discomfort Pain Intervention(s): Premedicated before session, Repositioned     Extremity/Trunk Assessment Upper Extremity Assessment Upper Extremity Assessment: Overall WFL for tasks assessed   Lower Extremity Assessment Lower Extremity Assessment: Generalized weakness  Communication Communication Communication: No apparent difficulties   Cognition Arousal: Alert Behavior During Therapy: WFL for tasks assessed/performed Cognition: No apparent impairments                               Following commands: Intact       Cueing  General Comments          Exercises     Shoulder Instructions      Home Living Family/patient expects to be discharged to:: Private residence Living Arrangements: Other relatives (neice) Available Help at Discharge: Family                         Home  Equipment: Agricultural consultant (2 wheels);Rollator (4 wheels)   Additional Comments: pt admitted from hotel, per chart plan to d/c to neice's house (unknown setup)      Prior Functioning/Environment Prior Level of Function : Needs assist;History of Falls (last six months)             Mobility Comments: RW, multiple falls recently ADLs Comments: family assists with meals    OT Problem List: Decreased strength;Decreased range of motion;Decreased activity tolerance;Impaired balance (sitting and/or standing)   OT Treatment/Interventions: Self-care/ADL training;Therapeutic exercise;Energy conservation;Therapeutic activities      OT Goals(Current goals can be found in the care plan section)   Acute Rehab OT Goals Patient Stated Goal: to go to rehab OT Goal Formulation: With patient Time For Goal Achievement: 01/01/24 Potential to Achieve Goals: Good ADL Goals Pt Will Perform Grooming: with modified independence;standing Pt Will Perform Lower Body Dressing: with modified independence;sit to/from stand;with caregiver independent in assisting;with adaptive equipment Pt Will Transfer to Toilet: with modified independence;ambulating;regular height toilet   OT Frequency:  Min 2X/week    Co-evaluation              AM-PAC OT 6 Clicks Daily Activity     Outcome Measure Help from another person eating meals?: None Help from another person taking care of personal grooming?: A Little Help from another person toileting, which includes using toliet, bedpan, or urinal?: A Lot Help from another person bathing (including washing, rinsing, drying)?: A Lot Help from another person to put on and taking off regular upper body clothing?: A Little Help from another person to put on and taking off regular lower body clothing?: A Lot 6 Click Score: 16   End of Session Equipment Utilized During Treatment: Rolling walker (2 wheels)  Activity Tolerance: Patient tolerated treatment well;Patient  limited by pain Patient left: in bed;with call bell/phone within reach;with bed alarm set;with nursing/sitter in room  OT Visit Diagnosis: Other abnormalities of gait and mobility (R26.89);Muscle weakness (generalized) (M62.81)                Time: 8941-8891 OT Time Calculation (min): 10 min Charges:  OT General Charges $OT Visit: 1 Visit OT Evaluation $OT Eval Low Complexity: 1 Low  Elston Slot, M.S. OTR/L  12/18/23, 1:18 PM  ascom [phone]

## 2023-12-18 NOTE — Progress Notes (Signed)
 Introduced patient to role of Statistician. Intake questions completed.  Patient states she was living in a hotel that had be converted to apartments prior to coming to the hospital. Plans to discharge to her niece, Samantha's house.   Patient had establishment of care appointment for today at [facility], which she cancelled given her admission to the hospital. Plans to re-schedule following discharge. Patient states her anxiety, depression and bipolar d/o is managed by [facility].   Patient does not drive, but has assistance with transportation from niece and sister. Denies any SDOH needs at present time.  Encouraged to call with questions or concerns.

## 2023-12-18 NOTE — Progress Notes (Addendum)
 Patient admitted to room 121 A from ED. A+Ox4. VSS. Cleansed, pericare provided, changed to gown. TLSO in place due to vertebral compression fracture as a result of a fall. Addressing pain management. Will continue to monitor with plan of care.

## 2023-12-18 NOTE — H&P (Addendum)
 History and Physical    Patient: April Soto DOB: 1960/07/23 DOA: 12/17/2023 DOS: the patient was seen and examined on 12/18/2023 PCP: System, Provider Not In  Patient coming from: Home-planning on moving in with her niece after discharge.  No interior steps in the home and there are 3 steps to enter the home and her nephew is building her a ramp access.  Chief Complaint:  Chief Complaint  Patient presents with   Fall   HPI: April Soto is a 63 y.o. female with medical history significant of asthma, tobacco abuse quit 3 months ago, chronic low back pain, major depressive disorder, GERD, dyslipidemia, hypertension, fibromyalgia and hypothyroidism.  Review of old records patient was admitted in May of this year due to fall presumed secondary to polypharmacy.  At that time multiple medications were discontinued including her MS Contin .  Patient presented to the ED the evening on 9/4 after slipping in bathroom and falling.  She was complaining at that time of rib/back and hip pain.  In the ED there were no obvious traumatic injuries but incidental finding on her CT of the chest revealed an age-indeterminate T12 40% vertebral body height loss consistent with acute compression fracture.  Patient has been reporting significant pain.  A TLSO brace was applied in the ED.  In regards to any symptoms prior to falling patient states that she has been having dyspnea on exertion with generalized weakness occasionally accompanied by dizziness for multiple months and had similar symptoms yesterday before falling.  Of note patient was experiencing resting hypoxemia with sats 90% at while in the ED and imaging was concerning for possible evolving left upper lobe pneumonia.  CT of the chest also revealed diffuse bronchial wall thickening.  Patient reports quitting smoking 3 months ago.  No recent cough chills or fever.  Hospitalist service was consulted to admit the patient.  Review of Systems: As  mentioned in the history of present illness. All other systems reviewed and are negative. Past Medical History:  Diagnosis Date   Anemia    years ago   Anxiety    takes Ativan  daily   Asthma    Bipolar 1 disorder (HCC)    Chronic back pain    HNP   Constipation    takes Ra Col Rite daily   Degenerative disc disease    Depression    was on Prozac  but taken off by MD for a couple of weeks   Depression    Fibromyalgia    takes Lyrica  daily   Genital herpes    GERD (gastroesophageal reflux disease)    takes Nexium  daily   Headache(784.0)    Heart murmur    grew out of   History of bronchitis    last time 15+yrs ago   History of rheumatic fever    Hyperlipidemia    takes Simvastatin  daily   Hypertension    takes Coreg  daily   Hypothyroidism    takes Synthroid  daily   Joint pain    Muscle spasm    takes Flexeril  daily as needed   Nocturia    Osteoarthritis    Peripheral edema    takes HCTZ daily   Pneumonia    hx of;last time 30+yrs ago   Raynaud's disease    Rheumatic fever    as a child   Seasonal allergies    takes Zyrtec  daily and uses Dymista as well   Stroke [facility])    ? 5 years ago. No  lasting deficits   Urinary incontinence    Weakness    nmbness and tingling-right leg   Past Surgical History:  Procedure Laterality Date   ABDOMINAL HYSTERECTOMY     BACK SURGERY     CHOLECYSTECTOMY     COLONOSCOPY     ESOPHAGOGASTRODUODENOSCOPY     FOOT SURGERY Right 12/2013   ankle, heel and top of foot   LUMBAR LAMINECTOMY/DECOMPRESSION MICRODISCECTOMY Right 01/14/2013   Procedure: RIGHT LUMBAR FOUR FIVE  LAMINECTOMY/DECOMPRESSION MICRODISCECTOMY 1 LEVEL;  Surgeon: Darina MALVA Boehringer, MD;  Location: [facility];  Service: Neurosurgery;  Laterality: Right;  RIGHT L45 microdiskectomy   OTHER SURGICAL HISTORY     Gastric stapling for weight loss. patient reports done in 1980's   SEPTOPLASTY     TONSILLECTOMY     TUBAL LIGATION     Social History:  reports that she  has quit smoking. Her smoking use included cigarettes. She has a 40 pack-year smoking history. She has never used smokeless tobacco. She reports that she does not drink alcohol and does not use drugs.  Allergies  Allergen Reactions   Levothyroxine  Dermatitis    depression    Family History  Problem Relation Age of Onset   Diabetes type II Mother    Hypertension Mother    Diabetes Mother    Diabetes Sister    Hypertension Sister    Hypertension Maternal Grandmother    Diabetes Maternal Grandmother    Breast cancer Neg Hx     Prior to Admission medications   Medication Sig Start Date End Date Taking? Authorizing Provider  acetaminophen  (TYLENOL ) 325 MG tablet Take 2 tablets (650 mg total) by mouth every 6 (six) hours as needed for mild pain (pain score 1-3), moderate pain (pain score 4-6), fever or headache (or Fever >/= 101). 08/25/23   Jens Durand, MD  acyclovir  (ZOVIRAX ) 400 MG tablet TAKE 1 TABLET BY MOUTH DAILY 01/23/23   Antonette Angeline ORN, NP  albuterol  (VENTOLIN  HFA) 108 (90 Base) MCG/ACT inhaler Inhale 2 puffs into the lungs every 4 (four) hours as needed for wheezing or shortness of breath.     [provider]  aspirin  EC 81 MG tablet Take 1 tablet (81 mg total) by mouth daily. Swallow whole. 07/03/22   Antonette Angeline ORN, NP  budesonide -formoterol  (SYMBICORT ) 160-4.5 MCG/ACT inhaler Inhale 2 puffs into the lungs daily. 08/25/23   Jens Durand, MD  cetirizine  (ZYRTEC ) 10 MG tablet Take 1 tablet (10 mg total) by mouth daily. 12/17/21   Antonette Angeline ORN, NP  diclofenac  (VOLTAREN ) 75 MG EC tablet Take 1 tablet (75 mg total) by mouth 2 (two) times daily as needed for moderate pain (pain score 4-6). 08/25/23   Jens Durand, MD  esomeprazole  (NEXIUM ) 40 MG capsule TAKE ONE CAPSULE (40 MG TOTAL) BY MOUTH DAILY. 07/31/23   Antonette Angeline ORN, NP  fluticasone  (FLONASE ) 50 MCG/ACT nasal spray INSTILL ONE SPRAY INTO EACH NOSTRIL DAILY AS NEEDED FOR SEASONAL ALLERGY SYMPTOMS 07/01/23   Antonette Angeline ORN, NP  hydrocortisone  (ANUSOL -HC) 2.5 % rectal cream Apply 1 application topically as needed for hemorrhoids.     [provider]  metoprolol  succinate (TOPROL -XL) 50 MG 24 hr tablet TAKE 1 TABLET BY MOUTH EVERY NIGHT AT   BEDTIME 05/15/23   Antonette Angeline ORN, NP  morphine  (MS CONTIN ) 15 MG 12 hr tablet Take 1 tablet (15 mg total) by mouth 3 (three) times daily. 08/25/23   Jens Durand, MD  MOVANTIK 25 MG TABS tablet  Take 25 mg by mouth daily. 07/19/20   [provider]  nicotine  (NICODERM CQ  - DOSED IN MG/24 HOURS) 21 mg/24hr patch Place 1 patch (21 mg total) onto the skin daily. 08/26/23   Jens Durand, MD  Olopatadine  HCl 0.2 % SOLN Place 1 drop into both eyes at bedtime as needed (allergies).    [provider]  omega-3 acid ethyl esters (LOVAZA ) 1 g capsule TAKE 2 CAPSULES BY MOUTH TWICE A DAY 05/15/23   Antonette Angeline ORN, NP  ondansetron  (ZOFRAN -ODT) 4 MG disintegrating tablet Take 1 tablet (4 mg total) by mouth every 8 (eight) hours as needed for nausea or vomiting. 05/10/21   Viviann Pastor, MD  Oxycodone  HCl 10 MG TABS Take 1 tablet (10 mg total) by mouth every 6 (six) hours as needed. 08/25/23   Jens Durand, MD  pregabalin  (LYRICA ) 100 MG capsule Take 100 mg by mouth 3 (three) times daily. 04/16/21   [provider]  rosuvastatin  (CRESTOR ) 40 MG tablet TAKE 1 TABLET BY MOUTH ONCE A DAY 04/15/23   Antonette Angeline ORN, NP  SYNTHROID  88 MCG tablet TAKE 1 TABLET BY MOUTH ONCE A DAY BEFORE BREAKFAST 05/15/23   Antonette Angeline ORN, NP  venlafaxine  XR (EFFEXOR -XR) 150 MG 24 hr capsule Take 1 capsule (150 mg total) by mouth every evening. For mood control 08/21/17   Money, Caron B, FNP  ziprasidone  (GEODON ) 40 MG capsule TAKE 1 CAPSULE BY MOUTH EVERY EVENING WITH FOOD FOR MOOD 10/01/20   Antonette Angeline ORN, NP    Physical Exam: Vitals:   12/18/23 0130 12/18/23 0139 12/18/23 0352 12/18/23 0802  BP: (!) 121/91  110/75 (!) 116/90  Pulse: 99  (!) 103 86  Resp: (!) 21  16  20   Temp:  98 F (36.7 C) 98.4 F (36.9 C)   TempSrc:  Oral Oral   SpO2: 94%  97% 91%  Weight:      Height:       Constitutional: NAD, calm, uncomfortable Respiratory: clear to auscultation bilaterally, no wheezing, no crackles.  Although exam limited by patient's inability to sit forward in the bed secondary to pain as well as placement of TLSO brace.  Normal respiratory effort. No accessory muscle use.  Resting O2 sats on room air between 90 and 97%. Cardiovascular: Regular rate and rhythm, no murmurs / rubs / gallops. No extremity edema. 2+ pedal pulses.  Abdomen: no tenderness, no masses palpated. No hepatosplenomegaly. Bowel sounds positive.  Musculoskeletal: no clubbing / cyanosis. No joint deformity upper and lower extremities. Good ROM, no contractures. Normal muscle tone.  Significant back pain around level of waist reproducible with any movement.  Due to application of TLSO brace unable to palpate over same area. Skin: no rashes, lesions, ulcers. No induration Neurologic: CN 2-12 grossly intact. Sensation intact,  Strength 5/5 x all 4 extremities.  Ability to reposition self in bed limited by back pain. Psychiatric: Normal judgment and insight. Alert and oriented x 3. Normal mood.     Data Reviewed:  Sodium 141 potassium 4.2 chloride 101, CO2 26, glucose 123, BUN 7, creatinine 0.72, LFTs are normal, lactic acid and procalcitonin are normal, WBC 10,200, hemoglobin 15.5, platelets 121,000, PCR for influenza RSV and COVID-negative, urinalysis unremarkable, blood cultures have been obtained and are pending  CT cervical spine, chest and head without any acute traumatic injuries other than suspected acute T12 compression fracture  CT chest reveals evolving left upper lobe pneumonia and diffuse bronchial wall thickening  Assessment and Plan:  Acute intractable low back pain in setting of T12 compression fracture History of chronic low back pain and fibromyalgia Current pain different  from chronic pain Will continue home oxycodone  10 mg but increase frequency from every 6 hours to every 4 hours as needed-may need to increase dosage acutely IV morphine  for severe breakthrough pain Continue TLSO brace along with PT and OT evaluation Per the neurosurgical team she is currently not a candidate for T12 kyphoplasty-Recommendations: continue TLSO brace for comfort see if her pain can get under control because there isn't that substantial loss of height and then if she had persistent pain or worsening kyphosis overtime could maybe consider kyphoplasty Begin calcitonin along with the above pain medicine regimen As a precaution we will check vitamin D level as well Patient typically takes 100 mg of Lyrica  at bedtime -previously intolerant to Lyrica  during the day related to sleepiness and dizziness.  Given acute pain we will begin Lyrica  25 mg at 10 AM and 4 PM and give 75 mg at bedtime  Acute hypoxia Evolving left upper lobe pneumonia Possible chronic bronchitis/known asthma Patient was noted to have O2 sats 90% at rest on room air; given severity of back pain this could be secondary to splinting and poor ventilatory effort from pain Does not have a white count or fever, no leukocytosis or left shift, and basic viral panel negative but as a precaution we will treat as an early community-acquired pneumonia Obtain ambulatory O2 sats Patient has reported chronic dyspnea on exertion with associated generalized weakness and dizziness for multiple months-CT chest with diffuse bronchial wall thickening that could be indicative of chronic bronchitis.  Patient states had issues with a.m. coughing until she quit smoking 3 months ago Patient likely will need PFTs after discharge to assist in diagnosing a possible underlying COPD from years of smoking Dyspnea on exertion could be reflective of underlying heart failure physiology especially diastolic dysfunction.  Will check BNP and echocardiogram.   Appears clinically euvolemic on exam Continue Breo Ellipta  and add DuoNebs every 6 hours as needed  Thrombocytopenia This appears to be an intermittent recurring issue over the past several years Continue to follow lab Platelets are greater than 100,000 and she has no obvious signs of bleeding or bruising  Hypertension Continue Toprol -XL  Dyslipidemia Continue Crestor  and omega-3 fatty acid  Hypothyroidism Continue Synthroid     Advance Care Planning:   Code Status: Full Code   VTE prophylaxis: Lovenox   Consults: Neurosurgery  Family Communication: Patient only  Severity of Illness: The appropriate patient status for this patient is INPATIENT. Inpatient status is judged to be reasonable and necessary in order to provide the required intensity of service to ensure the patient's safety. The patient's presenting symptoms, physical exam findings, and initial radiographic and laboratory data in the context of their chronic comorbidities is felt to place them at high risk for further clinical deterioration. Furthermore, it is not anticipated that the patient will be medically stable for discharge from the hospital within 2 midnights of admission.   * I certify that at the point of admission it is my clinical judgment that the patient will require inpatient hospital care spanning beyond 2 midnights from the point of admission due to high intensity of service, high risk for further deterioration and high frequency of surveillance required.*  Author: Isaiah Lever, NP 12/18/2023 9:43 AM  For on call review [email or web address].      Patient seen and evaluated at bedside, I agree with above documentation and  performed a physical exam independently as well as formulated Assessment and plan .  Patient presented after a mechanical fall at home. CT of the chest revealed an age-indeterminate T12 40% vertebral body height loss consistent with acute compression fracture.  Patient has been reporting  significant pain.   A TLSO brace was applied in the ED. Discussed with neurosurgical team, they states that she is currently not a candidate for T12 kyphoplasty- Recommendations: continue TLSO brace for comfort see if her pain can get under control because there isn't that substantial loss of height and then if she had persistent pain or worsening kyphosis overtime could maybe consider kyphoplasty  Patient was noted to be hypoxic likely from pain from fall, encouraged use of incentive spirometry and monitoring closely to rule out PNA  Continue Bronchodilators PRN Reviewed chest imaging and labs

## 2023-12-18 NOTE — [place] FL2 (Signed)
 [place]  MEDICAID FL2 LEVEL OF CARE FORM     IDENTIFICATION  Patient Name: April Soto Birthdate: 17-Jan-1961 Sex: female Admission Date (Current Location): 12/17/2023  [facility] and [place] Number:      Facility and Address:  [facility], [address] [place], [place] [address]      Provider Number: [id number]  Attending Physician Name and Address:  Dibia, Landon BRAVO, MD  Relative Name and Phone Number:  Caron Sherwood Rhody)  [phone]    Current Level of Care: Hospital Recommended Level of Care: Skilled Nursing Facility Prior Approval Number:    Date Approved/Denied:   PASRR Number: [id number]  Discharge Plan: SNF    Current Diagnoses: Patient Active Problem List   Diagnosis Date Noted   Vertebral compression fracture (HCC) 12/18/2023   OAB (overactive bladder) 07/03/2022   Thrombocytopenia (HCC) 07/03/2022   COPD (chronic obstructive pulmonary disease) (HCC) 03/14/2022   GERD (gastroesophageal reflux disease) 06/13/2021   Chronic constipation 06/13/2021   HLD (hyperlipidemia) 06/13/2021   Genital herpes 06/13/2021   Aortic atherosclerosis (HCC) 06/13/2021   Essential hypertension    MDD (major depressive disorder), recurrent episode, severe (HCC) 08/17/2017   Chronic midline low back pain with right-sided sciatica 04/05/2014   Asthma, chronic    Fibromyalgia 03/11/2014   Class 1 obesity due to excess calories with body mass index (BMI) of 34.0 to 34.9 in adult 03/03/2014   Chronic pain syndrome 03/03/2014   Acquired hypothyroidism 03/02/2014   Osteoarthritis 05/04/2013    Orientation RESPIRATION BLADDER Height & Weight     Self, Situation, Place    Continent Weight: 93.9 kg Height:  5' 6 (167.6 cm)  BEHAVIORAL SYMPTOMS/MOOD NEUROLOGICAL BOWEL NUTRITION STATUS      Continent Diet (Regular)  AMBULATORY STATUS COMMUNICATION OF NEEDS Skin   Extensive Assist Verbally                         Personal Care Assistance  Level of Assistance  Feeding, Dressing, Bathing Bathing Assistance: Limited assistance Feeding assistance: Limited assistance Dressing Assistance: Limited assistance     Functional Limitations Info             SPECIAL CARE FACTORS FREQUENCY  PT (By licensed PT), OT (By licensed OT)     PT Frequency: 5 x week OT Frequency: 5 x week            Contractures      Additional Factors Info  Code Status, Allergies Code Status Info: FULL Allergies Info: Levothyroxine            Current Medications (12/18/2023):  This is the current hospital active medication list Current Facility-Administered Medications  Medication Dose Route Frequency Provider Last Rate Last Admin   [START ON 12/19/2023] azithromycin  (ZITHROMAX ) 500 mg in sodium chloride  0.9 % 250 mL IVPB  500 mg Intravenous Q24H Ellis, Allison L, NP       calcitonin (salmon) (MIACALCIN MARCIANA) nasal spray 1 spray  1 spray Alternating Nares Daily Alto Isaiah CROME, NP   1 spray at 12/18/23 1135   [START ON 12/19/2023] cefTRIAXone  (ROCEPHIN ) 1 g in sodium chloride  0.9 % 100 mL IVPB  1 g Intravenous Q24H Alto Isaiah CROME, NP       enoxaparin  (LOVENOX ) injection 45 mg  45 mg Subcutaneous Q24H Alto Isaiah CROME, NP   45 mg at 12/18/23 1134   fluticasone  furoate-vilanterol (BREO ELLIPTA ) 200-25 MCG/ACT 1 puff  1 puff Inhalation Daily Alto Isaiah CROME, NP  1 puff at 12/18/23 0836   [START ON 12/19/2023] influenza vac split trivalent PF (FLUZONE ) injection 0.5 mL  0.5 mL Intramuscular Tomorrow-1000 Mansy, Jan A, MD       ipratropium-albuterol  (DUONEB) 0.5-2.5 (3) MG/3ML nebulizer solution 3 mL  3 mL Nebulization Q6H PRN Alto Isaiah CROME, NP       levothyroxine  (SYNTHROID ) tablet 88 mcg  88 mcg Oral Q0600 Alto Isaiah CROME, NP   88 mcg at 12/18/23 9162   metoprolol  succinate (TOPROL -XL) 24 hr tablet 50 mg  50 mg Oral QPM Alto Isaiah CROME, NP       morphine  (PF) 2 MG/ML injection 2 mg  2 mg Intravenous Q2H PRN Alto Isaiah CROME, NP   2 mg at  12/18/23 1134   omega-3 acid ethyl esters (LOVAZA ) capsule 2 g  2 capsule Oral BID Niels Kayla FALCON, RPH   2 g at 12/18/23 1221   oxyCODONE  (Oxy IR/ROXICODONE ) immediate release tablet 10 mg  10 mg Oral Q4H PRN Alto Isaiah CROME, NP   10 mg at 12/18/23 0944   pregabalin  (LYRICA ) capsule 25 mg  25 mg Oral BID Alto Isaiah CROME, NP   25 mg at 12/18/23 9163   And   pregabalin  (LYRICA ) capsule 75 mg  75 mg Oral QHS Alto Isaiah CROME, NP       rosuvastatin  (CRESTOR ) tablet 40 mg  40 mg Oral Daily Alto Isaiah CROME, NP   40 mg at 12/18/23 0837   sodium chloride  flush (NS) 0.9 % injection 3 mL  3 mL Intravenous Q12H Alto Isaiah CROME, NP   3 mL at 12/18/23 1222   venlafaxine  XR (EFFEXOR -XR) 24 hr capsule 150 mg  150 mg Oral QPM Alto Isaiah CROME, NP         Discharge Medications: Please see discharge summary for a list of discharge medications.  Relevant Imaging Results:  Relevant Lab Results:   Additional Information [id number]  Dalia GORMAN Fuse, RN

## 2023-12-18 NOTE — Evaluation (Signed)
 Physical Therapy Evaluation Patient Details Name: April Soto MRN: [id number] DOB: 08-30-1960 Today's Date: 12/18/2023  History of Present Illness  62yoF who comes to ED after slip in bathroom and fall with subsequent back pain. Workup revealing of age-indeterminate T12 40% vertebral body height loss consistent with acute compression fracture. Patient has been reporting significant pain. A TLSO brace was applied in the ED. PMH: asthma, tobacco abuse quit 3 months ago, chronic low back pain, major depressive disorder, GERD, dyslipidemia, hypertension, fibromyalgia and hypothyroidism.  Clinical Impression  Pt requires premedication, bed features, and moderate physical assistance to achieve mobility to EOB and rise to standing, however ability to walk is somewhat more independent with RW despite 1 significant LOB backwards. Pt tolerating only but a few feet AMB today whereas baseline includes distances >117ft. Pt seems well poised for a DC to STR for reurn of strength and function to assure highest functional level for eventual return to home.       If plan is discharge home, recommend the following: Direct supervision/assist for medications management;Direct supervision/assist for financial management   Can travel by private vehicle        Equipment Recommendations None recommended by PT  Recommendations for Other Services       Functional Status Assessment Patient has had a recent decline in their functional status and demonstrates the ability to make significant improvements in function in a reasonable and predictable amount of time.     Precautions / Restrictions Precautions Precautions: Fall;Back Recall of Precautions/Restrictions: Intact Required Braces or Orthoses: Spinal Brace Spinal Brace: Thoracolumbosacral orthotic Restrictions Weight Bearing Restrictions Per Provider Order: No      Mobility  Bed Mobility Overal bed mobility: Needs Assistance Bed Mobility: Supine to  Sit     Supine to sit: Mod assist, HOB elevated          Transfers Overall transfer level: Needs assistance Equipment used: Rolling walker (2 wheels) Transfers: Sit to/from Stand Sit to Stand: Mod assist           General transfer comment: can perform from significant elevation    Ambulation/Gait Ambulation/Gait assistance: Contact guard assist, Min assist Gait Distance (Feet): 12 Feet Assistive device: Rolling walker (2 wheels)         General Gait Details: antalgic, 1 posterio LOB requires minA from author to arrest fall  Stairs            Wheelchair Mobility     Tilt Bed    Modified Rankin (Stroke Patients Only)       Balance                                             Pertinent Vitals/Pain Pain Assessment Pain Assessment: 0-10 Pain Score: 7  Pain Location: low back Pain Intervention(s): Limited activity within patient's tolerance, Monitored during session, Premedicated before session    Home Living Family/patient expects to be discharged to:: Private residence Living Arrangements: Other relatives (niece's place) Available Help at Discharge: Family;Friend(s)             Home Equipment: Agricultural consultant (2 wheels);Rollator (4 wheels)      Prior Function Prior Level of Function : Needs assist;History of Falls (last six months)             Mobility Comments: RW, multiple falls recently ADLs Comments: Per son, family assists with meals,  cleaning; pt endorses indep with ADL and med mgt     Extremity/Trunk Assessment                Communication        Cognition Arousal: Alert Behavior During Therapy: WFL for tasks assessed/performed   PT - Cognitive impairments: No apparent impairments                                 Cueing       General Comments      Exercises     Assessment/Plan    PT Assessment Patient needs continued PT services  PT Problem List Decreased  strength;Decreased range of motion;Decreased activity tolerance;Decreased balance;Decreased mobility;Decreased coordination;Decreased knowledge of use of DME;Decreased safety awareness;Decreased knowledge of precautions       PT Treatment Interventions DME instruction;Gait training;Stair training;Functional mobility training;Therapeutic activities;Therapeutic exercise;Balance training;Neuromuscular re-education;Patient/family education    PT Goals (Current goals can be found in the Care Plan section)  Acute Rehab PT Goals Patient Stated Goal: improved pain, reduce falling, return to baseline walking or better PT Goal Formulation: With patient Time For Goal Achievement: 01/01/24 Potential to Achieve Goals: Good    Frequency Min 3X/week     Co-evaluation               AM-PAC PT 6 Clicks Mobility  Outcome Measure Help needed turning from your back to your side while in a flat bed without using bedrails?: A Lot Help needed moving from lying on your back to sitting on the side of a flat bed without using bedrails?: A Lot Help needed moving to and from a bed to a chair (including a wheelchair)?: A Lot Help needed standing up from a chair using your arms (e.g., wheelchair or bedside chair)?: A Lot Help needed to walk in hospital room?: A Lot Help needed climbing 3-5 steps with a railing? : A Lot 6 Click Score: 12    End of Session   Activity Tolerance: Patient tolerated treatment well;Patient limited by pain Patient left: in chair;with family/visitor present;with call bell/phone within reach Nurse Communication: Mobility status PT Visit Diagnosis: Difficulty in walking, not elsewhere classified (R26.2);Other abnormalities of gait and mobility (R26.89);Repeated falls (R29.6);History of falling (Z91.81)    Time: 9049-8981 PT Time Calculation (min) (ACUTE ONLY): 28 min   Charges:   PT Evaluation $PT Eval Moderate Complexity: 1 Mod PT Treatments $Therapeutic Activity: 8-22  mins PT General Charges $$ ACUTE PT VISIT: 1 Visit    10:32 AM, 12/18/23 Peggye JAYSON Linear, PT, DPT Physical Therapist - [employer]  [phone] (ASCOM)    April Soto 12/18/2023, 10:30 AM

## 2023-12-18 NOTE — Progress Notes (Signed)
 SATURATION QUALIFICATIONS: (This note is used to comply with regulatory documentation for home oxygen)  Patient Saturations on Room Air at Rest = 91%  Patient Saturations on Room Air while standing = 87%   MD made aware

## 2023-12-18 NOTE — Progress Notes (Signed)
 PHARMACY - PHYSICIAN COMMUNICATION CRITICAL VALUE ALERT - BLOOD CULTURE IDENTIFICATION (BCID)  Results for orders placed or performed during the hospital encounter of 12/17/23  Resp panel by RT-PCR (RSV, Flu A&B, Covid) Anterior Nasal Swab     Status: None   Collection Time: 12/18/23 12:37 AM   Specimen: Anterior Nasal Swab  Result Value Ref Range Status   SARS Coronavirus 2 by RT PCR NEGATIVE NEGATIVE Final    Comment: (NOTE) SARS-CoV-2 target nucleic acids are NOT DETECTED.  The SARS-CoV-2 RNA is generally detectable in upper respiratory specimens during the acute phase of infection. The lowest concentration of SARS-CoV-2 viral copies this assay can detect is 138 copies/mL. A negative result does not preclude SARS-Cov-2 infection and should not be used as the sole basis for treatment or other patient management decisions. A negative result may occur with  improper specimen collection/handling, submission of specimen other than nasopharyngeal swab, presence of viral mutation(s) within the areas targeted by this assay, and inadequate number of viral copies(<138 copies/mL). A negative result must be combined with clinical observations, patient history, and epidemiological information. The expected result is Negative.  Fact Sheet for Patients:  BloggerCourse.com  Fact Sheet for Healthcare Providers:  SeriousBroker.it  This test is no t yet approved or cleared by the [place]  FDA and  has been authorized for detection and/or diagnosis of SARS-CoV-2 by FDA under an Emergency Use Authorization (EUA). This EUA will remain  in effect (meaning this test can be used) for the duration of the COVID-19 declaration under Section 564(b)(1) of the Act, 21 U.S.C.section 360bbb-3(b)(1), unless the authorization is terminated  or revoked sooner.       Influenza A by PCR NEGATIVE NEGATIVE Final   Influenza B by PCR NEGATIVE NEGATIVE Final     Comment: (NOTE) The Xpert Xpress SARS-CoV-2/FLU/RSV plus assay is intended as an aid in the diagnosis of influenza from Nasopharyngeal swab specimens and should not be used as a sole basis for treatment. Nasal washings and aspirates are unacceptable for Xpert Xpress SARS-CoV-2/FLU/RSV testing.  Fact Sheet for Patients: BloggerCourse.com  Fact Sheet for Healthcare Providers: SeriousBroker.it  This test is not yet approved or cleared by the [place]  FDA and has been authorized for detection and/or diagnosis of SARS-CoV-2 by FDA under an Emergency Use Authorization (EUA). This EUA will remain in effect (meaning this test can be used) for the duration of the COVID-19 declaration under Section 564(b)(1) of the Act, 21 U.S.C. section 360bbb-3(b)(1), unless the authorization is terminated or revoked.     Resp Syncytial Virus by PCR NEGATIVE NEGATIVE Final    Comment: (NOTE) Fact Sheet for Patients: BloggerCourse.com  Fact Sheet for Healthcare Providers: SeriousBroker.it  This test is not yet approved or cleared by the [place]  FDA and has been authorized for detection and/or diagnosis of SARS-CoV-2 by FDA under an Emergency Use Authorization (EUA). This EUA will remain in effect (meaning this test can be used) for the duration of the COVID-19 declaration under Section 564(b)(1) of the Act, 21 U.S.C. section 360bbb-3(b)(1), unless the authorization is terminated or revoked.  Performed at [facility], [address] [place], [place] [address]   Blood culture (routine x 2)     Status: None (Preliminary result)   Collection Time: 12/18/23 12:38 AM   Specimen: BLOOD  Result Value Ref Range Status   Specimen Description BLOOD BLOOD RIGHT ARM  Final   Special Requests   Final    BOTTLES DRAWN AEROBIC AND ANAEROBIC Blood  Culture adequate volume   Culture  Setup  Time   Final    GRAM POSITIVE COCCI AEROBIC BOTTLE ONLY Organism ID to follow CRITICAL RESULT CALLED TO, READ BACK BY AND VERIFIED WITH: Zaliyah Meikle @ 12/18/2023 2317 AB Performed at [facility], [address] [place], [place] [address]    Culture GRAM POSITIVE COCCI  Final   Report Status PENDING  Incomplete  Blood culture (routine x 2)     Status: None (Preliminary result)   Collection Time: 12/18/23 12:38 AM   Specimen: BLOOD  Result Value Ref Range Status   Specimen Description BLOOD BLOOD LEFT ARM  Final   Special Requests   Final    BOTTLES DRAWN AEROBIC AND ANAEROBIC Blood Culture results may not be optimal due to an inadequate volume of blood received in culture bottles   Culture   Final    NO GROWTH < 12 HOURS Performed at [facility], [address] [place], [place] [address]    Report Status PENDING  Incomplete  Blood Culture ID Panel (Reflexed)     Status: Abnormal   Collection Time: 12/18/23 12:38 AM  Result Value Ref Range Status   Enterococcus faecalis NOT DETECTED NOT DETECTED Final   Enterococcus Faecium NOT DETECTED NOT DETECTED Final   Listeria monocytogenes NOT DETECTED NOT DETECTED Final   Staphylococcus species DETECTED (A) NOT DETECTED Final    Comment: CRITICAL RESULT CALLED TO, READ BACK BY AND VERIFIED WITH: Elizardo Chilson @ 12/18/2023 2317 AB    Staphylococcus aureus (BCID) NOT DETECTED NOT DETECTED Final   Staphylococcus epidermidis DETECTED (A) NOT DETECTED Final    Comment: CRITICAL RESULT CALLED TO, READ BACK BY AND VERIFIED WITH: Keimon Basaldua @ 12/18/2023 2317 AB    Staphylococcus lugdunensis NOT DETECTED NOT DETECTED Final   Streptococcus species NOT DETECTED NOT DETECTED Final   Streptococcus agalactiae NOT DETECTED NOT DETECTED Final   Streptococcus pneumoniae NOT DETECTED NOT DETECTED Final   Streptococcus pyogenes NOT DETECTED NOT DETECTED Final   A.calcoaceticus-baumannii NOT DETECTED NOT DETECTED Final    Bacteroides fragilis NOT DETECTED NOT DETECTED Final   Enterobacterales NOT DETECTED NOT DETECTED Final   Enterobacter cloacae complex NOT DETECTED NOT DETECTED Final   Escherichia coli NOT DETECTED NOT DETECTED Final   Klebsiella aerogenes NOT DETECTED NOT DETECTED Final   Klebsiella oxytoca NOT DETECTED NOT DETECTED Final   Klebsiella pneumoniae NOT DETECTED NOT DETECTED Final   Proteus species NOT DETECTED NOT DETECTED Final   Salmonella species NOT DETECTED NOT DETECTED Final   Serratia marcescens NOT DETECTED NOT DETECTED Final   Haemophilus influenzae NOT DETECTED NOT DETECTED Final   Neisseria meningitidis NOT DETECTED NOT DETECTED Final   Pseudomonas aeruginosa NOT DETECTED NOT DETECTED Final   Stenotrophomonas maltophilia NOT DETECTED NOT DETECTED Final   Candida albicans NOT DETECTED NOT DETECTED Final   Candida auris NOT DETECTED NOT DETECTED Final   Candida glabrata NOT DETECTED NOT DETECTED Final   Candida krusei NOT DETECTED NOT DETECTED Final   Candida parapsilosis NOT DETECTED NOT DETECTED Final   Candida tropicalis NOT DETECTED NOT DETECTED Final   Cryptococcus neoformans/gattii NOT DETECTED NOT DETECTED Final   Methicillin resistance mecA/C NOT DETECTED NOT DETECTED Final    Comment: Performed at [facility], [address] [place], [place] [address]    BCID Results: 1 (aerobic) of 4 bottles with Staph Epi, no resistance.  Pt currently on Azithromycin  & Ceftriaxone  for possible CAP.  Name of provider contacted: B.  Jesus, NP   Changes to prescribed antibiotics required: No changes at this time pending additional lab cx results.  Rankin CANDIE Dills, PharmD, MBA 12/19/2023 1:40 AM

## 2023-12-18 NOTE — ED Notes (Signed)
 Called to [facility] Ortho per MD Ward @1200am /TLSO Brace/Rep Jayla.

## 2023-12-18 NOTE — Plan of Care (Signed)

## 2023-12-18 NOTE — TOC Initial Note (Signed)
 Transition of Care [facility]) - Initial/Assessment Note    Patient Details  Name: April Soto MRN: [id number] Date of Birth: 09/24/1960  [facility] CM/SW Contact:    Dalia GORMAN Fuse, RN Phone Number: 12/18/2023, 1:14 PM  Clinical Narrative:                  Patient lives in an RV in her son's driveway. The entryway to the RV has 3 stairs. Therapy recommends SNF. TOC offered choice. The patient's 1st choice is [facility], she was there before, and then [facility]. FL2 sent out in [place]     Patient Goals and CMS Choice            Expected Discharge Plan and Services                                              Prior Living Arrangements/Services                       Activities of Daily Living   ADL Screening (condition at time of admission) Independently performs ADLs?: No Does the patient have a NEW difficulty with bathing/dressing/toileting/self-feeding that is expected to last >3 days?: Yes (Initiates electronic notice to provider for possible OT consult) Does the patient have a NEW difficulty with getting in/out of bed, walking, or climbing stairs that is expected to last >3 days?: Yes (Initiates electronic notice to provider for possible PT consult) Does the patient have a NEW difficulty with communication that is expected to last >3 days?: No Is the patient deaf or have difficulty hearing?: No Does the patient have difficulty seeing, even when wearing glasses/contacts?: No Does the patient have difficulty concentrating, remembering, or making decisions?: No  Permission Sought/Granted                  Emotional Assessment              Admission diagnosis:  Vertebral compression fracture (HCC) [M48.50XA] Acute respiratory failure with hypoxia (HCC) [J96.01] T12 compression fracture, initial encounter (HCC) [S22.080A] Fall, initial encounter [W19.XXXA] Community acquired pneumonia, unspecified laterality [J18.9] Patient Active  Problem List   Diagnosis Date Noted   Vertebral compression fracture (HCC) 12/18/2023   OAB (overactive bladder) 07/03/2022   Thrombocytopenia (HCC) 07/03/2022   COPD (chronic obstructive pulmonary disease) (HCC) 03/14/2022   GERD (gastroesophageal reflux disease) 06/13/2021   Chronic constipation 06/13/2021   HLD (hyperlipidemia) 06/13/2021   Genital herpes 06/13/2021   Aortic atherosclerosis (HCC) 06/13/2021   Essential hypertension    MDD (major depressive disorder), recurrent episode, severe (HCC) 08/17/2017   Chronic midline low back pain with right-sided sciatica 04/05/2014   Asthma, chronic    Fibromyalgia 03/11/2014   Class 1 obesity due to excess calories with body mass index (BMI) of 34.0 to 34.9 in adult 03/03/2014   Chronic pain syndrome 03/03/2014   Acquired hypothyroidism 03/02/2014   Osteoarthritis 05/04/2013   PCP:  System, Provider Not In Pharmacy:   [facility] [place], [place] - [address] [place] [address] Phone: [phone] Fax: [phone]  [facility] - [place], [place] - [address] [place] [address] Phone: [phone] Fax: [phone]     Social Drivers of Health (SDOH) Social History: SDOH Screenings   Food Insecurity: Food Insecurity Present (  12/18/2023)  Housing: High Risk (12/18/2023)  Transportation Needs: Unmet Transportation Needs (12/18/2023)  Utilities: Not At Risk (12/18/2023)  Alcohol Screen: Low Risk  (02/20/2023)  Depression (PHQ2-9): High Risk (02/20/2023)  Financial Resource Strain: High Risk (11/26/2023)  Physical Activity: Unknown (11/26/2023)  Social Connections: Unknown (11/26/2023)  Stress: Patient Declined (11/26/2023)  Tobacco Use: Medium Risk (12/17/2023)   SDOH Interventions:     Readmission Risk Interventions     No data to display

## 2023-12-18 NOTE — Progress Notes (Signed)
 Orthopedic Tech Progress Note Patient Details:  April Soto October 23, 1960 [id number]  Patient ID: April Soto, female   DOB: 1960/09/26, 63 y.o.   MRN: [id number] STAT order for TLSO ordered through [facility]. No ETA available at this time.  [name] L Bela Nyborg 12/18/2023, 12:09 AM

## 2023-12-18 NOTE — ED Notes (Signed)
 April Soto and April Soto [phone]

## 2023-12-19 DIAGNOSIS — S22080A Wedge compression fracture of T11-T12 vertebra, initial encounter for closed fracture: Secondary | ICD-10-CM | POA: Diagnosis present

## 2023-12-19 LAB — COMPREHENSIVE METABOLIC PANEL WITH GFR
ALT: 19 U/L (ref 0–44)
AST: 21 U/L (ref 15–41)
Albumin: 3.1 g/dL — ABNORMAL LOW (ref 3.5–5.0)
Alkaline Phosphatase: 57 U/L (ref 38–126)
Anion gap: 5 (ref 5–15)
BUN: 10 mg/dL (ref 8–23)
CO2: 29 mmol/L (ref 22–32)
Calcium: 8.4 mg/dL — ABNORMAL LOW (ref 8.9–10.3)
Chloride: 105 mmol/L (ref 98–111)
Creatinine, Ser: 0.62 mg/dL (ref 0.44–1.00)
GFR, Estimated: >60 mL/min (ref >60)
Glucose, Bld: 130 mg/dL — ABNORMAL HIGH (ref 70–99)
Potassium: 3.9 mmol/L (ref 3.5–5.1)
Sodium: 139 mmol/L (ref 135–145)
Total Bilirubin: 0.8 mg/dL (ref 0.0–1.2)
Total Protein: 5.9 g/dL — ABNORMAL LOW (ref 6.5–8.1)

## 2023-12-19 LAB — RESPIRATORY PANEL BY PCR

## 2023-12-19 LAB — CBC
HCT: 42.4 % (ref 36.0–46.0)
Hemoglobin: 13.8 g/dL (ref 12.0–15.0)
MCH: 30.7 pg (ref 26.0–34.0)
MCHC: 32.5 g/dL (ref 30.0–36.0)
MCV: 94.2 fL (ref 80.0–100.0)
Platelets: 111 K/uL — ABNORMAL LOW (ref 150–400)
RBC: 4.5 MIL/uL (ref 3.87–5.11)
RDW: 14.2 % (ref 11.5–15.5)
WBC: 7.1 K/uL (ref 4.0–10.5)
nRBC: 0 % (ref 0.0–0.2)

## 2023-12-19 LAB — GLUCOSE, CAPILLARY: Glucose-Capillary: 119 mg/dL — ABNORMAL HIGH (ref 70–99)

## 2023-12-19 LAB — VITAMIN D 25 HYDROXY (VIT D DEFICIENCY, FRACTURES): Vit D, 25-Hydroxy: 30.42 ng/mL (ref 30–100)

## 2023-12-19 MED ORDER — POLYETHYLENE GLYCOL 3350 17 G PO PACK
17.0000 g | PACK | Freq: Two times a day (BID) | ORAL | Status: DC
  Administered 2023-12-19 (×2): 17 g via ORAL
  Filled 2023-12-19 (×11): qty 1

## 2023-12-19 MED ORDER — ACETAMINOPHEN 325 MG PO TABS
975.0000 mg | ORAL_TABLET | Freq: Three times a day (TID) | ORAL | Status: DC
  Administered 2023-12-19 – 2023-12-24 (×17): 975 mg via ORAL
  Filled 2023-12-19 (×17): qty 3

## 2023-12-19 MED ORDER — ACYCLOVIR 200 MG PO CAPS
400.0000 mg | ORAL_CAPSULE | Freq: Every day | ORAL | Status: DC
  Administered 2023-12-19 – 2023-12-24 (×6): 400 mg via ORAL
  Filled 2023-12-19 (×6): qty 2

## 2023-12-19 MED ORDER — VITAMIN D 25 MCG (1000 UNIT) PO TABS
1000.0000 [IU] | ORAL_TABLET | Freq: Every day | ORAL | Status: DC
  Administered 2023-12-19 – 2023-12-24 (×6): 1000 [IU] via ORAL
  Filled 2023-12-19 (×6): qty 1

## 2023-12-19 MED ORDER — ZIPRASIDONE HCL 40 MG PO CAPS
40.0000 mg | ORAL_CAPSULE | Freq: Every evening | ORAL | Status: DC
  Administered 2023-12-19 – 2023-12-24 (×6): 40 mg via ORAL
  Filled 2023-12-19 (×6): qty 1

## 2023-12-19 MED ORDER — NALOXEGOL OXALATE 25 MG PO TABS
25.0000 mg | ORAL_TABLET | Freq: Every day | ORAL | Status: DC
  Administered 2023-12-19 – 2023-12-21 (×3): 25 mg via ORAL
  Filled 2023-12-19 (×7): qty 1

## 2023-12-19 MED ORDER — MORPHINE SULFATE (PF) 2 MG/ML IV SOLN
2.0000 mg | INTRAVENOUS | Status: DC | PRN
  Administered 2023-12-20 (×2): 2 mg via INTRAVENOUS
  Filled 2023-12-19 (×3): qty 1

## 2023-12-19 MED ORDER — PANTOPRAZOLE SODIUM 40 MG PO TBEC
40.0000 mg | DELAYED_RELEASE_TABLET | Freq: Every day | ORAL | Status: DC
  Administered 2023-12-19 – 2023-12-24 (×6): 40 mg via ORAL
  Filled 2023-12-19 (×6): qty 1

## 2023-12-19 MED ORDER — ASPIRIN 81 MG PO TBEC
81.0000 mg | DELAYED_RELEASE_TABLET | Freq: Every day | ORAL | Status: DC
  Administered 2023-12-19 – 2023-12-24 (×6): 81 mg via ORAL
  Filled 2023-12-19 (×6): qty 1

## 2023-12-19 MED ORDER — ONDANSETRON HCL 4 MG/2ML IJ SOLN
4.0000 mg | Freq: Three times a day (TID) | INTRAMUSCULAR | Status: DC | PRN
  Administered 2023-12-19 – 2023-12-22 (×5): 4 mg via INTRAVENOUS
  Filled 2023-12-19 (×5): qty 2

## 2023-12-19 NOTE — Progress Notes (Signed)
 PROGRESS NOTE    VESTER TITSWORTH  FMW:[id number] DOB: 03-24-1961 DOA: 12/17/2023 PCP: System, Provider Not In  Chief Complaint  Patient presents with   [facility] Course:  ALISAN DOKES is a 63 y.o. female with medical history significant of asthma, tobacco abuse quit 3 months ago, chronic low back pain, major depressive disorder, GERD, dyslipidemia, hypertension, fibromyalgia and hypothyroidism.  Was admitted 05/25 due to presumed fall secondary to polypharmacy. Presented s/p mechanical fall, found to have age-indeterminate T12 compression fracture.  TLSO brace applied. Seen by Neurosurgery. Reported having occasional DOE with generalized weakness and dizziness for months.  Was found to be hypoxic with sats 90%, imaging with possible LUL pneumonia.  Hospital course as below   Subjective: Patient was examined at bedside, new to me today.  States pain somewhat controlled with oxycodone , prefers to be back on MS Contin  for chronic pain management.  Discussed need to follow-up with outpatient pain clinic for chronic pain management   Objective: Vitals:   12/18/23 0802 12/18/23 2057 12/19/23 0053 12/19/23 0414  BP: (!) 116/90 107/70 (!) 144/82 113/63  Pulse: 86 (!) 101 (!) 106 (!) 103  Resp: 20     Temp:  99.5 F (37.5 C) 98.7 F (37.1 C) 98.5 F (36.9 C)  TempSrc:      SpO2: 91% 92% 91% 90%  Weight:      Height:        Intake/Output Summary (Last 24 hours) at 12/19/2023 0754 Last data filed at 12/18/2023 1300 Gross per 24 hour  Intake 420 ml  Output --  Net 420 ml   Filed Weights   12/17/23 2036  Weight: 93.9 kg    Examination: Constitutional: NAD, calm, uncomfortable Respiratory: clear to auscultation bilaterally anteriorly, no wheezing, no crackles Cardiovascular: Regular rate and rhythm, no murmurs / rubs / gallops. No extremity edema Abdomen: no tenderness, no masses palpated. No hepatosplenomegaly. Bowel sounds positive.  Musculoskeletal: no clubbing /  cyanosis. No joint deformity upper and lower extremities. Good ROM, no contractures. Normal muscle tone.  Significant back pain around level of waist reproducible with any movement.  Due to application of TLSO brace unable to palpate over same area Skin: no rashes, lesions, ulcers. No induration Neurologic: CN 2-12 grossly intact. Sensation intact,  Strength 5/5 x all 4 extremities.  Ability to reposition self in bed limited by back pain   Assessment & Plan:  Principal Problem:   Vertebral compression fracture (HCC)  Acute intractable low back pain in setting of T12 compression fracture History of chronic low back pain and fibromyalgia - States current pain severe than her usual chronic pain - s/p mechanical fall, CT head, C-spine, right hip negative for acute changes - CT chest with age-indeterminate T12 vertebral compression fracture - By neurosurgery, appreciate recs. not a candidate for kyphoplasty.  Recommend TLSO brace - Tylenol  TID, Oxycodone  10 mg at home q6h, increase to every 4h, IV morphine  for breakthrough pain, Lyrica  25 during the day, 75mg  at night - Calcitonin, vitamin D  low normal, start daily supplements - Follow-up with neurosurgery outpatient - PT/OT rec SNF  Acute hypoxia - resolved Evolving left upper lobe pneumonia Possible chronic bronchitis/known asthma - Suspect multifactorial due to poor respiratory effort from pain, on opioids, possible pneumonia - No leukocytosis, afebrile, procalcitonin negative, flu/COVID/RSV negative. Denies symptoms of UTI - BNP not elevated, echo shows EF 60 to 65%, grade 2 DD - CT with trace LUL consolidation, may represent developing infection/inflammation - Continue  Ceftriaxone , Azithromycin  - Check RVP  Possible COPD - Chronic smoker, quit 3 months ago - Will need PFTs after discharge, pulmonary referral - Continue Breo Ellipta  and add DuoNebs every 6 hours as needed  Chronic Dizziness - chronic dizziness for months now,  suspect due to polypharmacy - MS Contin  has been discontinued - Decrease dose of Lyrica , hold hydroxyzine , Flexeril .  Continue oxycodone , IV morphine  as needed  Staph epidermidis bacteremia -suspect contaminant -BCx 09/05 (1/2) with Staph epidermidis, likely contaminant -Repeat blood culture pending   Thrombocytopenia - Has been having intermittent thrombocytopenia for several years - Monitor platelets   Hypertension - Continue Toprol -XL   Dyslipidemia - Continue Crestor  and omega-3 fatty acid   Hypothyroidism - Continue Synthroid   Constipation - Resume Movantik , MiraLAX   DVT prophylaxis: Lovenox  SQ   Code Status: Full Code Disposition: SNF  Consultants:  Neurosurgery  Procedures:  None  Antimicrobials:  Anti-infectives (From admission, onward)    Start     Dose/Rate Route Frequency Ordered Stop   12/19/23 0000  cefTRIAXone  (ROCEPHIN ) 1 g in sodium chloride  0.9 % 100 mL IVPB  Status:  Discontinued        1 g 200 mL/hr over 30 Minutes Intravenous Every 24 hours 12/18/23 0943 12/18/23 1613   12/19/23 0000  azithromycin  (ZITHROMAX ) 500 mg in sodium chloride  0.9 % 250 mL IVPB        500 mg 250 mL/hr over 60 Minutes Intravenous Every 24 hours 12/18/23 0943     12/19/23 0000  cefTRIAXone  (ROCEPHIN ) 2 g in sodium chloride  0.9 % 100 mL IVPB        2 g 200 mL/hr over 30 Minutes Intravenous Every 24 hours 12/18/23 1613     12/18/23 0145  cefTRIAXone  (ROCEPHIN ) 2 g in sodium chloride  0.9 % 100 mL IVPB        2 g 200 mL/hr over 30 Minutes Intravenous  Once 12/18/23 0139 12/18/23 0251   12/18/23 0145  azithromycin  (ZITHROMAX ) 500 mg in sodium chloride  0.9 % 250 mL IVPB        500 mg 250 mL/hr over 60 Minutes Intravenous  Once 12/18/23 0139 12/18/23 0359       Data Reviewed: I have personally reviewed following labs and imaging studies CBC: Recent Labs  Lab 12/18/23 0037  WBC 10.2  NEUTROABS 6.9  HGB 15.5*  HCT 48.0*  MCV 94.9  PLT 121*   Basic Metabolic  Panel: Recent Labs  Lab 12/18/23 0037  NA 141  K 4.2  CL 101  CO2 26  GLUCOSE 123*  BUN 7*  CREATININE 0.72  CALCIUM  9.0   GFR: Estimated Creatinine Clearance: 82 mL/min (by C-G formula based on SCr of 0.72 mg/dL). Liver Function Tests: Recent Labs  Lab 12/18/23 0037  AST 34  ALT 27  ALKPHOS 76  BILITOT 1.1  PROT 6.9  ALBUMIN 3.7   CBG: No results for input(s): GLUCAP in the last 168 hours.  Recent Results (from the past 240 hours)  Resp panel by RT-PCR (RSV, Flu A&B, Covid) Anterior Nasal Swab     Status: None   Collection Time: 12/18/23 12:37 AM   Specimen: Anterior Nasal Swab  Result Value Ref Range Status   SARS Coronavirus 2 by RT PCR NEGATIVE NEGATIVE Final    Comment: (NOTE) SARS-CoV-2 target nucleic acids are NOT DETECTED.  The SARS-CoV-2 RNA is generally detectable in upper respiratory specimens during the acute phase of infection. The lowest concentration of SARS-CoV-2 viral copies this assay can detect  is 138 copies/mL. A negative result does not preclude SARS-Cov-2 infection and should not be used as the sole basis for treatment or other patient management decisions. A negative result may occur with  improper specimen collection/handling, submission of specimen other than nasopharyngeal swab, presence of viral mutation(s) within the areas targeted by this assay, and inadequate number of viral copies(<138 copies/mL). A negative result must be combined with clinical observations, patient history, and epidemiological information. The expected result is Negative.  Fact Sheet for Patients:  BloggerCourse.com  Fact Sheet for Healthcare Providers:  SeriousBroker.it  This test is no t yet approved or cleared by the [place]  FDA and  has been authorized for detection and/or diagnosis of SARS-CoV-2 by FDA under an Emergency Use Authorization (EUA). This EUA will remain  in effect (meaning this test  can be used) for the duration of the COVID-19 declaration under Section 564(b)(1) of the Act, 21 U.S.C.section 360bbb-3(b)(1), unless the authorization is terminated  or revoked sooner.       Influenza A by PCR NEGATIVE NEGATIVE Final   Influenza B by PCR NEGATIVE NEGATIVE Final    Comment: (NOTE) The Xpert Xpress SARS-CoV-2/FLU/RSV plus assay is intended as an aid in the diagnosis of influenza from Nasopharyngeal swab specimens and should not be used as a sole basis for treatment. Nasal washings and aspirates are unacceptable for Xpert Xpress SARS-CoV-2/FLU/RSV testing.  Fact Sheet for Patients: BloggerCourse.com  Fact Sheet for Healthcare Providers: SeriousBroker.it  This test is not yet approved or cleared by the [place]  FDA and has been authorized for detection and/or diagnosis of SARS-CoV-2 by FDA under an Emergency Use Authorization (EUA). This EUA will remain in effect (meaning this test can be used) for the duration of the COVID-19 declaration under Section 564(b)(1) of the Act, 21 U.S.C. section 360bbb-3(b)(1), unless the authorization is terminated or revoked.     Resp Syncytial Virus by PCR NEGATIVE NEGATIVE Final    Comment: (NOTE) Fact Sheet for Patients: BloggerCourse.com  Fact Sheet for Healthcare Providers: SeriousBroker.it  This test is not yet approved or cleared by the [place]  FDA and has been authorized for detection and/or diagnosis of SARS-CoV-2 by FDA under an Emergency Use Authorization (EUA). This EUA will remain in effect (meaning this test can be used) for the duration of the COVID-19 declaration under Section 564(b)(1) of the Act, 21 U.S.C. section 360bbb-3(b)(1), unless the authorization is terminated or revoked.  Performed at [facility], [address] [place], [place] [address]   Blood culture (routine x 2)      Status: None (Preliminary result)   Collection Time: 12/18/23 12:38 AM   Specimen: BLOOD  Result Value Ref Range Status   Specimen Description BLOOD BLOOD RIGHT ARM  Final   Special Requests   Final    BOTTLES DRAWN AEROBIC AND ANAEROBIC Blood Culture adequate volume   Culture  Setup Time   Final    GRAM POSITIVE COCCI AEROBIC BOTTLE ONLY Organism ID to follow CRITICAL RESULT CALLED TO, READ BACK BY AND VERIFIED WITH: NATHAN BELUE @ 12/18/2023 2317 AB Performed at [facility], [address] [place], [place] [address]    Culture GRAM POSITIVE COCCI  Final   Report Status PENDING  Incomplete  Blood culture (routine x 2)     Status: None (Preliminary result)   Collection Time: 12/18/23 12:38 AM   Specimen: BLOOD  Result Value Ref Range Status   Specimen Description BLOOD BLOOD LEFT ARM  Final  Special Requests   Final    BOTTLES DRAWN AEROBIC AND ANAEROBIC Blood Culture results may not be optimal due to an inadequate volume of blood received in culture bottles   Culture   Final    NO GROWTH < 12 HOURS Performed at [facility], [address] [place], [place] [address]    Report Status PENDING  Incomplete  Blood Culture ID Panel (Reflexed)     Status: Abnormal   Collection Time: 12/18/23 12:38 AM  Result Value Ref Range Status   Enterococcus faecalis NOT DETECTED NOT DETECTED Final   Enterococcus Faecium NOT DETECTED NOT DETECTED Final   Listeria monocytogenes NOT DETECTED NOT DETECTED Final   Staphylococcus species DETECTED (A) NOT DETECTED Final    Comment: CRITICAL RESULT CALLED TO, READ BACK BY AND VERIFIED WITH: NATHAN BELUE @ 12/18/2023 2317 AB    Staphylococcus aureus (BCID) NOT DETECTED NOT DETECTED Final   Staphylococcus epidermidis DETECTED (A) NOT DETECTED Final    Comment: CRITICAL RESULT CALLED TO, READ BACK BY AND VERIFIED WITH: NATHAN BELUE @ 12/18/2023 2317 AB    Staphylococcus lugdunensis NOT DETECTED NOT DETECTED Final    Streptococcus species NOT DETECTED NOT DETECTED Final   Streptococcus agalactiae NOT DETECTED NOT DETECTED Final   Streptococcus pneumoniae NOT DETECTED NOT DETECTED Final   Streptococcus pyogenes NOT DETECTED NOT DETECTED Final   A.calcoaceticus-baumannii NOT DETECTED NOT DETECTED Final   Bacteroides fragilis NOT DETECTED NOT DETECTED Final   Enterobacterales NOT DETECTED NOT DETECTED Final   Enterobacter cloacae complex NOT DETECTED NOT DETECTED Final   Escherichia coli NOT DETECTED NOT DETECTED Final   Klebsiella aerogenes NOT DETECTED NOT DETECTED Final   Klebsiella oxytoca NOT DETECTED NOT DETECTED Final   Klebsiella pneumoniae NOT DETECTED NOT DETECTED Final   Proteus species NOT DETECTED NOT DETECTED Final   Salmonella species NOT DETECTED NOT DETECTED Final   Serratia marcescens NOT DETECTED NOT DETECTED Final   Haemophilus influenzae NOT DETECTED NOT DETECTED Final   Neisseria meningitidis NOT DETECTED NOT DETECTED Final   Pseudomonas aeruginosa NOT DETECTED NOT DETECTED Final   Stenotrophomonas maltophilia NOT DETECTED NOT DETECTED Final   Candida albicans NOT DETECTED NOT DETECTED Final   Candida auris NOT DETECTED NOT DETECTED Final   Candida glabrata NOT DETECTED NOT DETECTED Final   Candida krusei NOT DETECTED NOT DETECTED Final   Candida parapsilosis NOT DETECTED NOT DETECTED Final   Candida tropicalis NOT DETECTED NOT DETECTED Final   Cryptococcus neoformans/gattii NOT DETECTED NOT DETECTED Final   Methicillin resistance mecA/C NOT DETECTED NOT DETECTED Final    Comment: Performed at [facility], [address], [place], [place] [address]     Radiology Studies: ECHOCARDIOGRAM COMPLETE Result Date: 12/18/2023    ECHOCARDIOGRAM REPORT   Patient Name:   BRANDALYNN OFALLON Date of Exam: 12/18/2023 Medical Rec #:  [id number]       Height:       66.0 in Accession #:    [id number]      Weight:       207.0 lb Date of Birth:  27-Nov-1960       BSA:          2.029 m Patient  Age:    62 years        BP:           116/90 mmHg Patient Gender: F               HR:  113 bpm. Exam Location:  [facility] Procedure: 2D Echo, Cardiac Doppler, Color Doppler and Intracardiac            Opacification Agent (Both Spectral and Color Flow Doppler were            utilized during procedure). Indications:     Dyspnea R06.00  History:         Patient has no prior history of Echocardiogram examinations.                  Signs/Symptoms:Dyspnea.  Sonographer:     Ashley McNeely-Sloane Referring Phys:  [id number] ISAIAH CROME ELLIS Diagnosing Phys: Redell Cave MD IMPRESSIONS  1. Left ventricular ejection fraction, by estimation, is 60 to 65%. The left ventricle has normal function. The left ventricle has no regional wall motion abnormalities. Left ventricular diastolic parameters are consistent with Grade II diastolic dysfunction (pseudonormalization).  2. Right ventricular systolic function is normal. The right ventricular size is normal.  3. The mitral valve is normal in structure. No evidence of mitral valve regurgitation.  4. The aortic valve was not well visualized. Aortic valve regurgitation is not visualized. FINDINGS  Left Ventricle: Left ventricular ejection fraction, by estimation, is 60 to 65%. The left ventricle has normal function. The left ventricle has no regional wall motion abnormalities. Definity  contrast agent was given IV to delineate the left ventricular  endocardial borders. The left ventricular internal cavity size was normal in size. There is no left ventricular hypertrophy. Left ventricular diastolic parameters are consistent with Grade II diastolic dysfunction (pseudonormalization). Right Ventricle: The right ventricular size is normal. No increase in right ventricular wall thickness. Right ventricular systolic function is normal. Left Atrium: Left atrial size was normal in size. Right Atrium: Right atrial size was normal in size. Pericardium: There is no evidence of pericardial  effusion. Mitral Valve: The mitral valve is normal in structure. No evidence of mitral valve regurgitation. MV peak gradient, 5.9 mmHg. The mean mitral valve gradient is 4.0 mmHg. Tricuspid Valve: The tricuspid valve is grossly normal. Tricuspid valve regurgitation is not demonstrated. Aortic Valve: The aortic valve was not well visualized. Aortic valve regurgitation is not visualized. Aortic valve mean gradient measures 4.0 mmHg. Aortic valve peak gradient measures 7.1 mmHg. Pulmonic Valve: The pulmonic valve was not well visualized. Pulmonic valve regurgitation is not visualized. Aorta: The aortic root is normal in size and structure. IAS/Shunts: No atrial level shunt detected by color flow Doppler.  LEFT VENTRICLE PLAX 2D LVIDd:         4.40 cm     Diastology LVIDs:         2.70 cm     LV e' medial:    6.20 cm/s LV PW:         1.00 cm     LV E/e' medial:  16.5 LV IVS:        1.10 cm     LV e' lateral:   5.77 cm/s                            LV E/e' lateral: 17.7  LV Volumes (MOD) LV vol d, MOD A2C: 52.7 ml LV vol s, MOD A2C: 10.4 ml LV vol s, MOD A4C: 11.8 ml LV SV MOD A2C:     42.3 ml RIGHT VENTRICLE RV Basal diam:  3.10 cm RV Mid diam:    1.90 cm RV S prime:     12.30 cm/s TAPSE (  M-mode): 1.6 cm LEFT ATRIUM           Index        RIGHT ATRIUM           Index LA diam:      3.40 cm 1.68 cm/m   RA Area:     11.40 cm LA Vol (A2C): 10.0 ml 4.93 ml/m   RA Volume:   25.10 ml  12.37 ml/m LA Vol (A4C): 27.6 ml 13.60 ml/m  AORTIC VALVE                   PULMONIC VALVE AV Vmax:           133.00 cm/s PV Vmax:        1.02 m/s AV Vmean:          86.600 cm/s PV Vmean:       71.200 cm/s AV VTI:            0.197 m     PV VTI:         0.146 m AV Peak Grad:      7.1 mmHg    PV Peak grad:   4.1 mmHg AV Mean Grad:      4.0 mmHg    PV Mean grad:   2.5 mmHg LVOT Vmax:         129.00 cm/s RVOT Peak grad: 3 mmHg LVOT Vmean:        84.800 cm/s LVOT VTI:          0.193 m LVOT/AV VTI ratio: 0.98 MITRAL VALVE MV Area (PHT): 6.32 cm      SHUNTS MV Peak grad:  5.9 mmHg     Systemic VTI: 0.19 m MV Mean grad:  4.0 mmHg     Pulmonic VTI: 0.123 m MV Vmax:       1.21 m/s MV Vmean:      89.5 cm/s MV Decel Time: 120 msec MV E velocity: 102.00 cm/s Redell Cave MD Electronically signed by Redell Cave MD Signature Date/Time: 12/18/2023/3:52:39 PM    Final    CT Chest Wo Contrast Result Date: 12/17/2023 CLINICAL DATA:  Chest trauma, blunt.  Fall EXAM: CT CHEST WITHOUT CONTRAST TECHNIQUE: Multidetector CT imaging of the chest was performed following the standard protocol without IV contrast. RADIATION DOSE REDUCTION: This exam was performed according to the departmental dose-optimization program which includes automated exposure control, adjustment of the mA and/or kV according to patient size and/or use of iterative reconstruction technique. COMPARISON:  CT angio chest 03/14/2022 FINDINGS: Cardiovascular: The thoracic aorta is normal in caliber. The heart is normal in size. No significant pericardial effusion. Mild atherosclerotic plaque. Left anterior descending coronary calcification. Lungs/Pleura: Lower bilateral axis, lingular atelectasis. Diffuse bronchial wall thickening. Trace consolidation along the left upper lobe (4:64). No pulmonary nodule. No pulmonary mass. No pulmonary contusion or laceration. No pneumatocele formation. No pleural effusion. No pneumothorax. No hemothorax. Mediastinum/Nodes: No pneumomediastinum. The central airways are patent. The esophagus is unremarkable. The thyroid  is unremarkable. Limited evaluation for hilar lymphadenopathy on this noncontrast study. No mediastinal or axillary lymphadenopathy. Musculoskeletal/Chest wall No chest wall mass. No acute rib or sternal fracture. Interval development of age-indeterminate T12 40% vertebral body height loss. Upper Abdomen: No acute abnormality. Status post cholecystectomy. Gastric surgical changes. Stable 1.5 cm right adrenal gland nodule with a density of -5 Hounsfield  units- consistent with lipid-rich benign adenoma. No follow-up imaging is recommended. IMPRESSION: 1. No acute intrathoracic traumatic injury with limited evaluation on  this noncontrast study. 2. Interval development of age-indeterminate T12 40% vertebral body height loss. Correlate with point tenderness to palpation to evaluate for an acute compression fracture component. 3. Trace left upper lobe consolidation that may represent developing infection/inflammation. Followup PA and lateral chest X-ray is recommended in 3-4 weeks following therapy to ensure resolution. 4. Aortic Atherosclerosis (ICD10-I70.0) including left anterior descending coronary calcification. Electronically Signed   By: Morgane  Naveau M.D.   On: 12/17/2023 23:36   CT HEAD WO CONTRAST ( ) Result Date: 12/17/2023 CLINICAL DATA:  Head trauma, moderate-severe; Neck trauma, dangerous injury mechanism (Age 43-64y) EXAM: CT HEAD WITHOUT CONTRAST CT CERVICAL SPINE WITHOUT CONTRAST TECHNIQUE: Multidetector CT imaging of the head and cervical spine was performed following the standard protocol without intravenous contrast. Multiplanar CT image reconstructions of the cervical spine were also generated. RADIATION DOSE REDUCTION: This exam was performed according to the departmental dose-optimization program which includes automated exposure control, adjustment of the mA and/or kV according to patient size and/or use of iterative reconstruction technique. COMPARISON:  CT head 08/20/2023, CT C-spine 08/20/2023 FINDINGS: CT HEAD FINDINGS Brain: Patchy and confluent areas of decreased attenuation are noted throughout the deep and periventricular white matter of the cerebral hemispheres bilaterally, compatible with chronic microvascular ischemic disease. No evidence of large-territorial acute infarction. No parenchymal hemorrhage. No mass lesion. No extra-axial collection. No mass effect or midline shift. No hydrocephalus. Basilar cisterns are patent.  Vascular: No hyperdense vessel. Skull: No acute fracture or focal lesion. Sinuses/Orbits: Paranasal sinuses and mastoid air cells are clear. The orbits are unremarkable. Other: None. CT CERVICAL SPINE FINDINGS Alignment: Normal. Skull base and vertebrae: Multilevel moderate degenerative changes spine most prominent at the C4-C7 levels. No associated severe osseous neural foraminal or central canal stenosis. No acute fracture. No aggressive appearing focal osseous lesion or focal pathologic process. Soft tissues and spinal canal: No prevertebral fluid or swelling. No visible canal hematoma. Upper chest: Diffuse bronchial wall thickening. Please see separately dictated CT chest 12/17/2023. Other: Patient is edentulous. Atherosclerotic plaque of the aortic arch and its main branches. IMPRESSION: 1. No acute intracranial abnormality. 2. No acute displaced fracture or traumatic listhesis of the cervical spine. 3.  Aortic Atherosclerosis (ICD10-I70.0). Electronically Signed   By: Morgane  Naveau M.D.   On: 12/17/2023 23:28   CT Cervical Spine Wo Contrast Result Date: 12/17/2023 CLINICAL DATA:  Head trauma, moderate-severe; Neck trauma, dangerous injury mechanism (Age 59-64y) EXAM: CT HEAD WITHOUT CONTRAST CT CERVICAL SPINE WITHOUT CONTRAST TECHNIQUE: Multidetector CT imaging of the head and cervical spine was performed following the standard protocol without intravenous contrast. Multiplanar CT image reconstructions of the cervical spine were also generated. RADIATION DOSE REDUCTION: This exam was performed according to the departmental dose-optimization program which includes automated exposure control, adjustment of the mA and/or kV according to patient size and/or use of iterative reconstruction technique. COMPARISON:  CT head 08/20/2023, CT C-spine 08/20/2023 FINDINGS: CT HEAD FINDINGS Brain: Patchy and confluent areas of decreased attenuation are noted throughout the deep and periventricular white matter of the  cerebral hemispheres bilaterally, compatible with chronic microvascular ischemic disease. No evidence of large-territorial acute infarction. No parenchymal hemorrhage. No mass lesion. No extra-axial collection. No mass effect or midline shift. No hydrocephalus. Basilar cisterns are patent. Vascular: No hyperdense vessel. Skull: No acute fracture or focal lesion. Sinuses/Orbits: Paranasal sinuses and mastoid air cells are clear. The orbits are unremarkable. Other: None. CT CERVICAL SPINE FINDINGS Alignment: Normal. Skull base and vertebrae: Multilevel moderate degenerative changes spine most prominent  at the C4-C7 levels. No associated severe osseous neural foraminal or central canal stenosis. No acute fracture. No aggressive appearing focal osseous lesion or focal pathologic process. Soft tissues and spinal canal: No prevertebral fluid or swelling. No visible canal hematoma. Upper chest: Diffuse bronchial wall thickening. Please see separately dictated CT chest 12/17/2023. Other: Patient is edentulous. Atherosclerotic plaque of the aortic arch and its main branches. IMPRESSION: 1. No acute intracranial abnormality. 2. No acute displaced fracture or traumatic listhesis of the cervical spine. 3.  Aortic Atherosclerosis (ICD10-I70.0). Electronically Signed   By: Morgane  Naveau M.D.   On: 12/17/2023 23:28   CT Hip Right Wo Contrast Result Date: 12/17/2023 CLINICAL DATA:  Hip trauma, fracture suspected, xray done EXAM: CT OF THE RIGHT HIP WITHOUT CONTRAST TECHNIQUE: Multidetector CT imaging of the right hip was performed according to the standard protocol. Multiplanar CT image reconstructions were also generated. RADIATION DOSE REDUCTION: This exam was performed according to the departmental dose-optimization program which includes automated exposure control, adjustment of the mA and/or kV according to patient size and/or use of iterative reconstruction technique. COMPARISON:  X-ray right hip 12/17/2023 FINDINGS:  Bones/Joint/Cartilage No evidence of fracture, dislocation, or joint effusion. Severe degenerative changes of the right hip with almost complete loss of joint space, subchondral cystic changes and sclerosis. No aggressive appearing focal bone abnormality. Ligaments Suboptimally assessed by CT. Muscles and Tendons Grossly unremarkable. Soft tissues Unremarkable. Other: Colonic diverticulosis. IMPRESSION: 1. Severe degenerative changes of the right hip. 2.  Negative for acute traumatic injury. Electronically Signed   By: Morgane  Naveau M.D.   On: 12/17/2023 23:25   DG Ribs Unilateral W/Chest Left Result Date: 12/17/2023 CLINICAL DATA:  injury  fall EXAM: LEFT RIBS AND CHEST - 3+ VIEW COMPARISON:  None Available. FINDINGS: The heart and mediastinal contours are within normal limits. No focal consolidation. No pulmonary edema. No pleural effusion. No pneumothorax. No acute displaced fracture or other bone lesions are seen involving the left ribs. Bowel staples overlie the left upper quadrant. IMPRESSION: 1. No acute displaced left rib fracture. Please note, nondisplaced rib fractures may be occult on radiograph. 2. No acute cardiopulmonary abnormality. Electronically Signed   By: Morgane  Naveau M.D.   On: 12/17/2023 21:12   DG Hip Unilat W or Wo Pelvis 2-3 Views Right Result Date: 12/17/2023 CLINICAL DATA:  Fall, right hip pain EXAM: DG HIP (WITH OR WITHOUT PELVIS) 2-3V RIGHT COMPARISON:  None Available. FINDINGS: Normal alignment. No acute fracture or dislocation. Severe right hip degenerative arthritis with near complete loss of the joint space and extensive osteophyte formation. Mild left hip degenerative arthritis. L4-5 fusion procedure with instrumentation noted. Soft tissues are unremarkable. IMPRESSION: 1. Severe right hip degenerative arthritis. No acute fracture or dislocation. Electronically Signed   By: Dorethia Molt M.D.   On: 12/17/2023 21:08    Scheduled Meds:  calcitonin (salmon)  1 spray  Alternating Nares Daily   enoxaparin  (LOVENOX ) injection  45 mg Subcutaneous Q24H   fluticasone  furoate-vilanterol  1 puff Inhalation Daily   influenza vac split trivalent PF  0.5 mL Intramuscular Tomorrow-1000   levothyroxine   88 mcg Oral Q0600   metoprolol  succinate  50 mg Oral QPM   omega-3 acid ethyl esters  2 capsule Oral BID   pregabalin   25 mg Oral BID   And   pregabalin   75 mg Oral QHS   rosuvastatin   40 mg Oral Daily   sodium chloride  flush  3 mL Intravenous Q12H   venlafaxine  XR  150 mg Oral QPM   Continuous Infusions:  azithromycin  500 mg (12/19/23 0026)   cefTRIAXone  (ROCEPHIN )  IV 2 g (12/18/23 2342)     LOS: 1 day  MDM: Patient is high risk for one or more organ failure.  They necessitate ongoing hospitalization for continued IV therapies and subsequent lab monitoring. Total time spent interpreting labs and vitals, reviewing the medical record, coordinating care amongst consultants and care team members, directly assessing and discussing care with the patient and/or family: 55 min  Laree Lock, MD [facility]  To contact the attending physician between 7A-7P please use Epic Chat. To contact the covering physician during after hours 7P-7A, please review Amion.  12/19/2023, 7:54 AM   *This document has been created with the assistance of dictation software. Please excuse typographical errors. *

## 2023-12-19 NOTE — Consult Note (Signed)
 Consult requested by:  Dr. Jerelene  Consult requested for:  T12 fracture  Primary Physician:  System, Provider Not In  History of Present Illness: 12/18/2023 April Soto is here today with a chief complaint of a fall.  She has been having back pain.  During workup, she was found to have a compression fracture.  She has pain in the area near her thoracolumbar junction.  She has been having generalized weakness and had some concerns for possible pneumonia prompting admission.  She denies any new weakness or numbness.  She has no bowel or bladder complaints.   Review of Systems:  A 10 point review of systems is negative, except for the pertinent positives and negatives detailed in the HPI.  Past Medical History: Past Medical History:  Diagnosis Date   Anemia    years ago   Anxiety    takes Ativan  daily   Asthma    Bipolar 1 disorder (HCC)    Chronic back pain    HNP   Constipation    takes Ra Col Rite daily   Degenerative disc disease    Depression    was on Prozac  but taken off by MD for a couple of weeks   Depression    Fibromyalgia    takes Lyrica  daily   Genital herpes    GERD (gastroesophageal reflux disease)    takes Nexium  daily   Headache(784.0)    Heart murmur    grew out of   History of bronchitis    last time 15+yrs ago   History of rheumatic fever    Hyperlipidemia    takes Simvastatin  daily   Hypertension    takes Coreg  daily   Hypothyroidism    takes Synthroid  daily   Joint pain    Muscle spasm    takes Flexeril  daily as needed   Nocturia    Osteoarthritis    Peripheral edema    takes HCTZ daily   Pneumonia    hx of;last time 30+yrs ago   Raynaud's disease    Rheumatic fever    as a child   Seasonal allergies    takes Zyrtec  daily and uses Dymista as well   Stroke [facility])    ? 5 years ago. No lasting deficits   Urinary incontinence    Weakness    nmbness and tingling-right leg    Past Surgical History: Past Surgical  History:  Procedure Laterality Date   ABDOMINAL HYSTERECTOMY     BACK SURGERY     CHOLECYSTECTOMY     COLONOSCOPY     ESOPHAGOGASTRODUODENOSCOPY     FOOT SURGERY Right 12/2013   ankle, heel and top of foot   LUMBAR LAMINECTOMY/DECOMPRESSION MICRODISCECTOMY Right 01/14/2013   Procedure: RIGHT LUMBAR FOUR FIVE  LAMINECTOMY/DECOMPRESSION MICRODISCECTOMY 1 LEVEL;  Surgeon: Darina MALVA Boehringer, MD;  Location: [facility];  Service: Neurosurgery;  Laterality: Right;  RIGHT L45 microdiskectomy   OTHER SURGICAL HISTORY     Gastric stapling for weight loss. patient reports done in 1980's   SEPTOPLASTY     TONSILLECTOMY     TUBAL LIGATION      Allergies: Allergies as of 12/17/2023 - Review Complete 12/17/2023  Allergen Reaction Noted   Levothyroxine  Dermatitis 02/20/2023    Medications: Current Meds  Medication Sig   acetaminophen  (TYLENOL ) 325 MG tablet Take 2 tablets (650 mg total) by mouth every 6 (six) hours as needed for mild pain (pain score 1-3), moderate pain (pain score 4-6), fever or  headache (or Fever >/= 101).   acyclovir  (ZOVIRAX ) 400 MG tablet TAKE 1 TABLET BY MOUTH DAILY   aspirin  EC 81 MG tablet Take 1 tablet (81 mg total) by mouth daily. Swallow whole.   diclofenac  (VOLTAREN ) 75 MG EC tablet Take 1 tablet (75 mg total) by mouth 2 (two) times daily as needed for moderate pain (pain score 4-6).   esomeprazole  (NEXIUM ) 40 MG capsule TAKE ONE CAPSULE (40 MG TOTAL) BY MOUTH DAILY.   fluticasone  (FLONASE ) 50 MCG/ACT nasal spray INSTILL ONE SPRAY INTO EACH NOSTRIL DAILY AS NEEDED FOR SEASONAL ALLERGY SYMPTOMS   hydrOXYzine  (ATARAX ) 25 MG tablet Take 25 mg by mouth 3 (three) times daily.   metoprolol  succinate (TOPROL -XL) 50 MG 24 hr tablet TAKE 1 TABLET BY MOUTH EVERY NIGHT AT   BEDTIME   MOVANTIK  25 MG TABS tablet Take 25 mg by mouth daily.   omega-3 acid ethyl esters (LOVAZA ) 1 g capsule TAKE 2 CAPSULES BY MOUTH TWICE A DAY   ondansetron  (ZOFRAN -ODT) 4 MG disintegrating tablet Take  1 tablet (4 mg total) by mouth every 8 (eight) hours as needed for nausea or vomiting.   Oxycodone  HCl 10 MG TABS Take 1 tablet (10 mg total) by mouth every 6 (six) hours as needed.   pregabalin  (LYRICA ) 100 MG capsule Take 100 mg by mouth 3 (three) times daily.   rosuvastatin  (CRESTOR ) 40 MG tablet TAKE 1 TABLET BY MOUTH ONCE A DAY   SYNTHROID  88 MCG tablet TAKE 1 TABLET BY MOUTH ONCE A DAY BEFORE BREAKFAST   venlafaxine  XR (EFFEXOR -XR) 150 MG 24 hr capsule Take 1 capsule (150 mg total) by mouth every evening. For mood control   ziprasidone  (GEODON ) 40 MG capsule TAKE 1 CAPSULE BY MOUTH EVERY EVENING WITH FOOD FOR MOOD    Social History: Social History   Tobacco Use   Smoking status: Former    Current packs/day: 1.00    Average packs/day: 1 pack/day for 40.0 years (40.0 ttl pk-yrs)    Types: Cigarettes   Smokeless tobacco: Never  Vaping Use   Vaping status: Never Used  Substance Use Topics   Alcohol use: No   Drug use: No    Family Medical History: Family History  Problem Relation Age of Onset   Diabetes type II Mother    Hypertension Mother    Diabetes Mother    Diabetes Sister    Hypertension Sister    Hypertension Maternal Grandmother    Diabetes Maternal Grandmother    Breast cancer Neg Hx     Physical Examination: Vitals:   12/19/23 0414 12/19/23 0800  BP: 113/63 112/61  Pulse: (!) 103 94  Resp:  18  Temp: 98.5 F (36.9 C) 98.6 F (37 C)  SpO2: 90% 91%    General: Patient is in no apparent distress. Attention to examination is appropriate.  Neck:   Supple.  Full range of motion.  Respiratory: Patient is breathing without any difficulty.   NEUROLOGICAL:     Awake, alert, oriented to person, place, and time.  Speech is clear and fluent.  Cranial Nerves: Pupils equal round and reactive to light.  Facial tone is symmetric.  Facial sensation is symmetric. Shoulder shrug is symmetric. Tongue protrusion is midline.  There is no pronator  drift.  Strength: Side Biceps Triceps Deltoid Interossei Grip Wrist Ext. Wrist Flex.  R 5 5 5 5 5 5 5   L 5 5 5 5 5 5 5    Side Iliopsoas Quads Hamstring PF DF EHL  R 5 5 5 5 5 5   L 5 5 5 5 5 5    Bilateral upper and lower extremity sensation is intact to light touch.    No evidence of dysmetria noted.  Gait is untested.     Medical Decision Making  Imaging: CT Chest 12/17/2023 IMPRESSION: 1. No acute intrathoracic traumatic injury with limited evaluation on this noncontrast study. 2. Interval development of age-indeterminate T12 40% vertebral body height loss. Correlate with point tenderness to palpation to evaluate for an acute compression fracture component. 3. Trace left upper lobe consolidation that may represent developing infection/inflammation. Followup PA and lateral chest X-ray is recommended in 3-4 weeks following therapy to ensure resolution. 4. Aortic Atherosclerosis (ICD10-I70.0) including left anterior descending coronary calcification.     Electronically Signed   By: Morgane  Naveau M.D.   On: 12/17/2023 23:36  I have personally reviewed the images and agree with the above interpretation.  Assessment and Plan: Ms. Cilia is a pleasant 63 y.o. female with T12 compression fracture.  She has no additional neurologic complaints.  I recommend that she continue with the TLSO brace when out of bed.  She is cleared to work with physical and Occupational Therapy.  We will arrange follow-up for her to monitor her compression fracture.    I have communicated my recommendations to the requesting physician and coordinated care to facilitate these recommendations.     Kyria Bumgardner K. Clois MD, [facility] Neurosurgery

## 2023-12-19 NOTE — Plan of Care (Signed)

## 2023-12-19 NOTE — Plan of Care (Signed)
  Problem: Education: Goal: Knowledge of General Education information will improve Description: Including pain rating scale, medication(s)/side effects and non-pharmacologic comfort measures Outcome: Progressing   Problem: Health Behavior/Discharge Planning: Goal: Ability to manage health-related needs will improve Outcome: Progressing   Problem: Clinical Measurements: Goal: Diagnostic test results will improve Outcome: Progressing Goal: Respiratory complications will improve Outcome: Progressing   Problem: Nutrition: Goal: Adequate nutrition will be maintained Outcome: Progressing   Problem: Coping: Goal: Level of anxiety will decrease Outcome: Progressing

## 2023-12-19 NOTE — Progress Notes (Signed)
 Physical Therapy Treatment Patient Details Name: April Soto MRN: [id number] DOB: 25-Dec-1960 Today's Date: 12/19/2023   History of Present Illness 62yoF who comes to ED after slip in bathroom and fall with subsequent back pain. Workup revealing of age-indeterminate T12 40% vertebral body height loss consistent with acute compression fracture. Patient has been reporting significant pain. A TLSO brace was applied in the ED. PMH: asthma, tobacco abuse quit 3 months ago, chronic low back pain, major depressive disorder, GERD, dyslipidemia, hypertension, fibromyalgia and hypothyroidism.    PT Comments  Pt was supine in bed with HOB elevated ~ 20 degrees. She is A and O x 4. Agreeable and pleasant throughout. Endorses 7/10 pain however willing to get OOB. RN aware of c/o pain and will be administering pain meds at the conclusion of session. Pt was able to perform log roll R to short sit. TLSO applied in sitting. Pt needed extensive assistance to proper place TLSO. Educated on spinal precautions prior to OOB mobility. Pt tolerated ambulation to doorway of room and return. Vcs for increased BOS and upright posture. At conclusion of session, pt was seated in recliner with call bell + all needs in reach. Pt remains far form baseline abilities and will benefit from post acute rehab to maximize her independence and safety with all ADLs      If plan is discharge home, recommend the following: A little help with walking and/or transfers;A little help with bathing/dressing/bathroom;Assistance with cooking/housework;Direct supervision/assist for medications management;Direct supervision/assist for financial management;Assist for transportation;Help with stairs or ramp for entrance     Equipment Recommendations  Other (comment) (defer to next level of care)       Precautions / Restrictions Precautions Precautions: Fall;Back Recall of Precautions/Restrictions: Intact Precaution/Restrictions Comments: reviewed  spinal precautions Required Braces or Orthoses: Spinal Brace Spinal Brace: Thoracolumbosacral orthotic Restrictions Weight Bearing Restrictions Per Provider Order: No     Mobility  Bed Mobility Overal bed mobility: Needs Assistance Bed Mobility: Rolling, Sidelying to Sit, Supine to Sit Rolling: Contact guard assist, Used rails Sidelying to sit: Mod assist, Used rails Supine to sit: Mod assist, Used rails   Transfers Overall transfer level: Needs assistance Equipment used: Rolling walker (2 wheels) Transfers: Sit to/from Stand Sit to Stand: From elevated surface, Min assist     Ambulation/Gait Ambulation/Gait assistance: Contact guard assist Gait Distance (Feet): 25 Feet Assistive device: Rolling walker (2 wheels) Gait Pattern/deviations: Step-through pattern, Antalgic, Narrow base of support Gait velocity: decreased  General Gait Details: pt tolerated ambulation ~ 25 ft. distance limited by pain and fatigue.    Balance Overall balance assessment: Needs assistance Sitting-balance support: No upper extremity supported, Feet supported Sitting balance-Leahy Scale: Fair     Standing balance support: Bilateral upper extremity supported, During functional activity, Reliant on assistive device for balance Standing balance-Leahy Scale: Fair         Hotel manager: No apparent difficulties  Cognition Arousal: Alert Behavior During Therapy: WFL for tasks assessed/performed   PT - Cognitive impairments: No apparent impairments      PT - Cognition Comments: Pt is A and O. pleasant and cooperative Following commands: Intact      Cueing Cueing Techniques: Verbal cues     General Comments General comments (skin integrity, edema, etc.): reviewed importance of routine mobility and OOB activity. reviewed spinal precautions and proper brace application. Pt did require extensive assistance for proper placement TLSO      Pertinent Vitals/Pain Pain  Assessment Pain Assessment: 0-10 Pain Score: 7  Pain Location: low back Pain Descriptors / Indicators: Aching, Discomfort Pain Intervention(s): Limited activity within patient's tolerance, Monitored during session, Repositioned, Patient requesting pain meds-RN notified     PT Goals (current goals can now be found in the care plan section) Acute Rehab PT Goals Patient Stated Goal: improved pain, reduce falling, return to baseline walking or better Progress towards PT goals: Progressing toward goals    Frequency    Min 3X/week       AM-PAC PT 6 Clicks Mobility   Outcome Measure  Help needed turning from your back to your side while in a flat bed without using bedrails?: A Little Help needed moving from lying on your back to sitting on the side of a flat bed without using bedrails?: A Lot Help needed moving to and from a bed to a chair (including a wheelchair)?: A Lot Help needed standing up from a chair using your arms (e.g., wheelchair or bedside chair)?: A Lot Help needed to walk in hospital room?: A Little Help needed climbing 3-5 steps with a railing? : A Lot 6 Click Score: 14    End of Session Equipment Utilized During Treatment: Back brace Activity Tolerance: Patient tolerated treatment well Patient left: in chair;with call bell/phone within reach;with chair alarm set;with nursing/sitter in room Nurse Communication: Mobility status PT Visit Diagnosis: Difficulty in walking, not elsewhere classified (R26.2);Other abnormalities of gait and mobility (R26.89);Repeated falls (R29.6);History of falling (Z91.81)     Time: 9158-9097 PT Time Calculation (min) (ACUTE ONLY): 21 min  Charges:    $Gait Training: 8-22 mins PT General Charges $$ ACUTE PT VISIT: 1 Visit                     Rankin Essex PTA 12/19/23, 9:23 AM

## 2023-12-20 DIAGNOSIS — S22080A Wedge compression fracture of T11-T12 vertebra, initial encounter for closed fracture: Secondary | ICD-10-CM | POA: Diagnosis not present

## 2023-12-20 LAB — CULTURE, BLOOD (ROUTINE X 2): Special Requests: ADEQUATE

## 2023-12-20 MED ORDER — SODIUM CHLORIDE 0.9 % IV SOLN
500.0000 mg | INTRAVENOUS | Status: AC
  Administered 2023-12-21: 500 mg via INTRAVENOUS
  Filled 2023-12-20: qty 5

## 2023-12-20 MED ORDER — MORPHINE SULFATE (PF) 2 MG/ML IV SOLN
2.0000 mg | INTRAVENOUS | Status: DC | PRN
  Administered 2023-12-20 (×2): 2 mg via INTRAVENOUS
  Filled 2023-12-20 (×2): qty 1

## 2023-12-20 MED ORDER — HYDROXYZINE HCL 50 MG PO TABS
25.0000 mg | ORAL_TABLET | Freq: Three times a day (TID) | ORAL | Status: DC | PRN
  Administered 2023-12-21 – 2023-12-24 (×3): 25 mg via ORAL
  Filled 2023-12-20 (×3): qty 1

## 2023-12-20 MED ORDER — SODIUM CHLORIDE 0.9 % IV SOLN
2.0000 g | INTRAVENOUS | Status: AC
  Administered 2023-12-20 – 2023-12-22 (×3): 2 g via INTRAVENOUS
  Filled 2023-12-20 (×3): qty 20

## 2023-12-20 NOTE — Progress Notes (Addendum)
 PROGRESS NOTE    April Soto  FMW:[id number] DOB: Feb 16, 1961 DOA: 12/17/2023 PCP: System, Provider Not In  Chief Complaint  Patient presents with   [facility] Course:  April Soto is a 63 y.o. female with medical history significant of asthma, tobacco abuse quit 3 months ago, chronic low back pain, major depressive disorder, GERD, dyslipidemia, hypertension, fibromyalgia and hypothyroidism.  Was admitted 05/25 due to presumed fall secondary to polypharmacy. Presented s/p mechanical fall, found to have age-indeterminate T12 compression fracture.  TLSO brace applied. Seen by Neurosurgery. Reported having occasional DOE with generalized weakness and dizziness for months.  Was found to be hypoxic with sats 90%, imaging with possible LUL pneumonia.  Hospital course as below   Subjective: Patient was examined at bedside.  States pain somewhat controlled with oxycodone  and also requiring IV Morphine . prefers to be back on MS Contin  for chronic pain management.  Discussed need to follow-up with outpatient pain clinic for chronic pain management   Objective: Vitals:   12/19/23 2015 12/20/23 0418 12/20/23 0848 12/20/23 1508  BP: 116/67 97/61 (!) 89/54 95/63  Pulse: 96 76 87 93  Resp: 16 16 18 18   Temp: 97.6 F (36.4 C) 97.6 F (36.4 C) 97.6 F (36.4 C) 98.4 F (36.9 C)  TempSrc:   Oral Oral  SpO2: 92% 94% 92% 94%  Weight:      Height:        Intake/Output Summary (Last 24 hours) at 12/20/2023 1545 Last data filed at 12/20/2023 1502 Gross per 24 hour  Intake 1420.29 ml  Output --  Net 1420.29 ml   Filed Weights   12/17/23 2036  Weight: 93.9 kg    Examination: Constitutional: NAD, calm, uncomfortable Respiratory: clear to auscultation bilaterally anteriorly, no wheezing, no crackles Cardiovascular: Regular rate and rhythm, no murmurs / rubs / gallops. No extremity edema Abdomen: no tenderness, no masses palpated. No hepatosplenomegaly. Bowel sounds positive.   Musculoskeletal: no clubbing / cyanosis. No joint deformity upper and lower extremities. Good ROM, no contractures. Normal muscle tone.  Significant back pain around level of waist reproducible with any movement.  Due to application of TLSO brace unable to palpate over same area Skin: no rashes, lesions, ulcers. No induration Neurologic: CN 2-12 grossly intact. Sensation intact,  Strength 5/5 x all 4 extremities.  Ability to reposition self in bed limited by back pain   Assessment & Plan:  Principal Problem:   Vertebral compression fracture (HCC) Active Problems:   T12 compression fracture, initial encounter (HCC)  Acute intractable low back pain in setting of T12 compression fracture History of chronic low back pain and fibromyalgia - States current pain severe than her usual chronic pain - s/p mechanical fall, CT head, C-spine, right hip negative for acute changes - CT chest with age-indeterminate T12 vertebral compression fracture - By neurosurgery, appreciate recs. not a candidate for kyphoplasty.  Recommend TLSO brace - Tylenol  TID, Oxycodone  10 mg at home q6h, increase to every 4h, IV morphine  q3 for breakthrough pain, Lyrica  25 during the day, 75mg  at night - Calcitonin, vitamin D  low normal, start daily supplements - Follow-up with neurosurgery outpatient - PT/OT rec SNF  Acute hypoxia - resolved Evolving left upper lobe pneumonia Possible chronic bronchitis/known asthma - Suspect multifactorial due to poor respiratory effort from pain, on opioids, possible pneumonia - No leukocytosis, afebrile, procalcitonin negative, RVP negative. Denies symptoms of UTI - BNP not elevated, echo shows EF 60 to 65%, grade 2 DD -  CT with trace LUL consolidation, may represent developing infection/inflammation - Continue Ceftriaxone , Azithromycin   Possible COPD - Chronic smoker, quit 3 months ago - Will need PFTs after discharge, pulmonary referral - Continue Breo Ellipta  and add DuoNebs  every 6 hours as needed  Chronic Dizziness - chronic dizziness for months now, suspect due to polypharmacy - MS Contin  has been discontinued - Decrease dose of Lyrica , change Hydroxyzine  to prn, hold Flexeril .  Continue oxycodone , IV morphine  as needed  Staph epidermidis bacteremia -suspect contaminant -BCx 09/05 (1/2) with Staph epidermidis, likely contaminant -Repeat blood culture pending   Thrombocytopenia - Has been having intermittent thrombocytopenia for several years - Monitor platelets   Hypertension - Continue Toprol -XL   Dyslipidemia - Continue Crestor  and omega-3 fatty acid   Hypothyroidism - Continue Synthroid   Constipation - Resume Movantik , MiraLAX   DVT prophylaxis: Lovenox  SQ   Code Status: Full Code Disposition: SNF  Consultants:  Neurosurgery  Procedures:  None  Antimicrobials:  Anti-infectives (From admission, onward)    Start     Dose/Rate Route Frequency Ordered Stop   12/19/23 1800  acyclovir  (ZOVIRAX ) 200 MG capsule 400 mg        400 mg Oral Daily 12/19/23 1526     12/19/23 0000  cefTRIAXone  (ROCEPHIN ) 1 g in sodium chloride  0.9 % 100 mL IVPB  Status:  Discontinued        1 g 200 mL/hr over 30 Minutes Intravenous Every 24 hours 12/18/23 0943 12/18/23 1613   12/19/23 0000  azithromycin  (ZITHROMAX ) 500 mg in sodium chloride  0.9 % 250 mL IVPB        500 mg 250 mL/hr over 60 Minutes Intravenous Every 24 hours 12/18/23 0943     12/19/23 0000  cefTRIAXone  (ROCEPHIN ) 2 g in sodium chloride  0.9 % 100 mL IVPB        2 g 200 mL/hr over 30 Minutes Intravenous Every 24 hours 12/18/23 1613     12/18/23 0145  cefTRIAXone  (ROCEPHIN ) 2 g in sodium chloride  0.9 % 100 mL IVPB        2 g 200 mL/hr over 30 Minutes Intravenous  Once 12/18/23 0139 12/18/23 0251   12/18/23 0145  azithromycin  (ZITHROMAX ) 500 mg in sodium chloride  0.9 % 250 mL IVPB        500 mg 250 mL/hr over 60 Minutes Intravenous  Once 12/18/23 0139 12/18/23 0359       Data Reviewed: I  have personally reviewed following labs and imaging studies CBC: Recent Labs  Lab 12/18/23 0037 12/19/23 0742  WBC 10.2 7.1  NEUTROABS 6.9  --   HGB 15.5* 13.8  HCT 48.0* 42.4  MCV 94.9 94.2  PLT 121* 111*   Basic Metabolic Panel: Recent Labs  Lab 12/18/23 0037 12/19/23 0742  NA 141 139  K 4.2 3.9  CL 101 105  CO2 26 29  GLUCOSE 123* 130*  BUN 7* 10  CREATININE 0.72 0.62  CALCIUM  9.0 8.4*   GFR: Estimated Creatinine Clearance: 82 mL/min (by C-G formula based on SCr of 0.62 mg/dL). Liver Function Tests: Recent Labs  Lab 12/18/23 0037 12/19/23 0742  AST 34 21  ALT 27 19  ALKPHOS 76 57  BILITOT 1.1 0.8  PROT 6.9 5.9*  ALBUMIN 3.7 3.1*   CBG: Recent Labs  Lab 12/19/23 0800  GLUCAP 119*    Recent Results (from the past 240 hours)  Resp panel by RT-PCR (RSV, Flu A&B, Covid) Anterior Nasal Swab     Status: None   Collection  Time: 12/18/23 12:37 AM   Specimen: Anterior Nasal Swab  Result Value Ref Range Status   SARS Coronavirus 2 by RT PCR NEGATIVE NEGATIVE Final    Comment: (NOTE) SARS-CoV-2 target nucleic acids are NOT DETECTED.  The SARS-CoV-2 RNA is generally detectable in upper respiratory specimens during the acute phase of infection. The lowest concentration of SARS-CoV-2 viral copies this assay can detect is 138 copies/mL. A negative result does not preclude SARS-Cov-2 infection and should not be used as the sole basis for treatment or other patient management decisions. A negative result may occur with  improper specimen collection/handling, submission of specimen other than nasopharyngeal swab, presence of viral mutation(s) within the areas targeted by this assay, and inadequate number of viral copies(<138 copies/mL). A negative result must be combined with clinical observations, patient history, and epidemiological information. The expected result is Negative.  Fact Sheet for Patients:  BloggerCourse.com  Fact Sheet  for Healthcare Providers:  SeriousBroker.it  This test is no t yet approved or cleared by the [place]  FDA and  has been authorized for detection and/or diagnosis of SARS-CoV-2 by FDA under an Emergency Use Authorization (EUA). This EUA will remain  in effect (meaning this test can be used) for the duration of the COVID-19 declaration under Section 564(b)(1) of the Act, 21 U.S.C.section 360bbb-3(b)(1), unless the authorization is terminated  or revoked sooner.       Influenza A by PCR NEGATIVE NEGATIVE Final   Influenza B by PCR NEGATIVE NEGATIVE Final    Comment: (NOTE) The Xpert Xpress SARS-CoV-2/FLU/RSV plus assay is intended as an aid in the diagnosis of influenza from Nasopharyngeal swab specimens and should not be used as a sole basis for treatment. Nasal washings and aspirates are unacceptable for Xpert Xpress SARS-CoV-2/FLU/RSV testing.  Fact Sheet for Patients: BloggerCourse.com  Fact Sheet for Healthcare Providers: SeriousBroker.it  This test is not yet approved or cleared by the [place]  FDA and has been authorized for detection and/or diagnosis of SARS-CoV-2 by FDA under an Emergency Use Authorization (EUA). This EUA will remain in effect (meaning this test can be used) for the duration of the COVID-19 declaration under Section 564(b)(1) of the Act, 21 U.S.C. section 360bbb-3(b)(1), unless the authorization is terminated or revoked.     Resp Syncytial Virus by PCR NEGATIVE NEGATIVE Final    Comment: (NOTE) Fact Sheet for Patients: BloggerCourse.com  Fact Sheet for Healthcare Providers: SeriousBroker.it  This test is not yet approved or cleared by the [place]  FDA and has been authorized for detection and/or diagnosis of SARS-CoV-2 by FDA under an Emergency Use Authorization (EUA). This EUA will remain in effect (meaning  this test can be used) for the duration of the COVID-19 declaration under Section 564(b)(1) of the Act, 21 U.S.C. section 360bbb-3(b)(1), unless the authorization is terminated or revoked.  Performed at [facility], [address] [place], [place] [address]   Blood culture (routine x 2)     Status: Abnormal   Collection Time: 12/18/23 12:38 AM   Specimen: BLOOD  Result Value Ref Range Status   Specimen Description   Final    BLOOD BLOOD RIGHT ARM Performed at [facility], [address] [place], [place] [address]    Special Requests   Final    BOTTLES DRAWN AEROBIC AND ANAEROBIC Blood Culture adequate volume Performed at [facility], [address], [place], [place] [address]    Culture  Setup Time   Final    GRAM POSITIVE  [facility] AEROBIC BOTTLE ONLY CRITICAL RESULT CALLED TO, READ BACK BY AND VERIFIED WITH: NATHAN BELUE @ 12/18/2023 2317 AB    Culture (A)  Final    STAPHYLOCOCCUS EPIDERMIDIS THE SIGNIFICANCE OF ISOLATING THIS ORGANISM FROM A SINGLE SET OF BLOOD CULTURES WHEN MULTIPLE SETS ARE DRAWN IS UNCERTAIN. PLEASE NOTIFY THE MICROBIOLOGY DEPARTMENT WITHIN ONE WEEK IF SPECIATION AND SENSITIVITIES ARE REQUIRED. Performed at [facility], [address] [place], [place] [address]    Report Status 12/20/2023 FINAL  Final  Blood culture (routine x 2)     Status: None (Preliminary result)   Collection Time: 12/18/23 12:38 AM   Specimen: BLOOD  Result Value Ref Range Status   Specimen Description BLOOD BLOOD LEFT ARM  Final   Special Requests   Final    BOTTLES DRAWN AEROBIC AND ANAEROBIC Blood Culture results may not be optimal due to an inadequate volume of blood received in culture bottles   Culture   Final    NO GROWTH 2 DAYS Performed at [facility], [address], [place], [place] [address]    Report Status PENDING  Incomplete  Blood Culture ID Panel (Reflexed)     Status: Abnormal   Collection Time: 12/18/23 12:38  AM  Result Value Ref Range Status   Enterococcus faecalis NOT DETECTED NOT DETECTED Final   Enterococcus Faecium NOT DETECTED NOT DETECTED Final   Listeria monocytogenes NOT DETECTED NOT DETECTED Final   Staphylococcus species DETECTED (A) NOT DETECTED Final    Comment: CRITICAL RESULT CALLED TO, READ BACK BY AND VERIFIED WITH: NATHAN BELUE @ 12/18/2023 2317 AB    Staphylococcus aureus (BCID) NOT DETECTED NOT DETECTED Final   Staphylococcus epidermidis DETECTED (A) NOT DETECTED Final    Comment: CRITICAL RESULT CALLED TO, READ BACK BY AND VERIFIED WITH: NATHAN BELUE @ 12/18/2023 2317 AB    Staphylococcus lugdunensis NOT DETECTED NOT DETECTED Final   Streptococcus species NOT DETECTED NOT DETECTED Final   Streptococcus agalactiae NOT DETECTED NOT DETECTED Final   Streptococcus pneumoniae NOT DETECTED NOT DETECTED Final   Streptococcus pyogenes NOT DETECTED NOT DETECTED Final   A.calcoaceticus-baumannii NOT DETECTED NOT DETECTED Final   Bacteroides fragilis NOT DETECTED NOT DETECTED Final   Enterobacterales NOT DETECTED NOT DETECTED Final   Enterobacter cloacae complex NOT DETECTED NOT DETECTED Final   Escherichia coli NOT DETECTED NOT DETECTED Final   Klebsiella aerogenes NOT DETECTED NOT DETECTED Final   Klebsiella oxytoca NOT DETECTED NOT DETECTED Final   Klebsiella pneumoniae NOT DETECTED NOT DETECTED Final   Proteus species NOT DETECTED NOT DETECTED Final   Salmonella species NOT DETECTED NOT DETECTED Final   Serratia marcescens NOT DETECTED NOT DETECTED Final   Haemophilus influenzae NOT DETECTED NOT DETECTED Final   Neisseria meningitidis NOT DETECTED NOT DETECTED Final   Pseudomonas aeruginosa NOT DETECTED NOT DETECTED Final   Stenotrophomonas maltophilia NOT DETECTED NOT DETECTED Final   Candida albicans NOT DETECTED NOT DETECTED Final   Candida auris NOT DETECTED NOT DETECTED Final   Candida glabrata NOT DETECTED NOT DETECTED Final   Candida krusei NOT DETECTED NOT  DETECTED Final   Candida parapsilosis NOT DETECTED NOT DETECTED Final   Candida tropicalis NOT DETECTED NOT DETECTED Final   Cryptococcus neoformans/gattii NOT DETECTED NOT DETECTED Final   Methicillin resistance mecA/C NOT DETECTED NOT DETECTED Final    Comment: Performed at [facility], [address] [place], [place] [address]  Respiratory (~20 pathogens) panel by PCR     Status: None  Collection Time: 12/19/23  9:37 AM   Specimen: Nasopharyngeal Swab; Respiratory  Result Value Ref Range Status   Adenovirus NOT DETECTED NOT DETECTED Final   Coronavirus 229E NOT DETECTED NOT DETECTED Final    Comment: (NOTE) The Coronavirus on the Respiratory Panel, DOES NOT test for the novel  Coronavirus (2019 nCoV)    Coronavirus HKU1 NOT DETECTED NOT DETECTED Final   Coronavirus NL63 NOT DETECTED NOT DETECTED Final   Coronavirus OC43 NOT DETECTED NOT DETECTED Final   Metapneumovirus NOT DETECTED NOT DETECTED Final   Rhinovirus / Enterovirus NOT DETECTED NOT DETECTED Final   Influenza A NOT DETECTED NOT DETECTED Final   Influenza B NOT DETECTED NOT DETECTED Final   Parainfluenza Virus 1 NOT DETECTED NOT DETECTED Final   Parainfluenza Virus 2 NOT DETECTED NOT DETECTED Final   Parainfluenza Virus 3 NOT DETECTED NOT DETECTED Final   Parainfluenza Virus 4 NOT DETECTED NOT DETECTED Final   Respiratory Syncytial Virus NOT DETECTED NOT DETECTED Final   Bordetella pertussis NOT DETECTED NOT DETECTED Final   Bordetella Parapertussis NOT DETECTED NOT DETECTED Final   Chlamydophila pneumoniae NOT DETECTED NOT DETECTED Final   Mycoplasma pneumoniae NOT DETECTED NOT DETECTED Final    Comment: Performed at [facility], [address], [place], [place] [address]  Culture, blood (single) w Reflex to ID Panel     Status: None (Preliminary result)   Collection Time: 12/19/23  9:59 AM   Specimen: BLOOD  Result Value Ref Range Status   Specimen Description BLOOD BLOOD RIGHT HAND  Final    Special Requests   Final    BOTTLES DRAWN AEROBIC AND ANAEROBIC Blood Culture adequate volume   Culture   Final    NO GROWTH < 24 HOURS Performed at [facility], [address] [place], [place] [address]    Report Status PENDING  Incomplete     Radiology Studies: No results found.   Scheduled Meds:  acetaminophen   975 mg Oral Q8H   acyclovir   400 mg Oral Daily   aspirin  EC  81 mg Oral Daily   calcitonin (salmon)  1 spray Alternating Nares Daily   cholecalciferol   1,000 Units Oral Daily   enoxaparin  (LOVENOX ) injection  45 mg Subcutaneous Q24H   fluticasone  furoate-vilanterol  1 puff Inhalation Daily   levothyroxine   88 mcg Oral Q0600   metoprolol  succinate  50 mg Oral QPM   naloxegol  oxalate  25 mg Oral Daily   omega-3 acid ethyl esters  2 capsule Oral BID   pantoprazole   40 mg Oral Daily   polyethylene glycol  17 g Oral BID   pregabalin   25 mg Oral BID   And   pregabalin   75 mg Oral QHS   rosuvastatin   40 mg Oral Daily   sodium chloride  flush  3 mL Intravenous Q12H   venlafaxine  XR  150 mg Oral QPM   ziprasidone   40 mg Oral QPM   Continuous Infusions:  azithromycin  Stopped (12/20/23 0208)   cefTRIAXone  (ROCEPHIN )  IV 200 mL/hr at 12/20/23 1502     LOS: 2 days  MDM: Patient is high risk for one or more organ failure.  They necessitate ongoing hospitalization for continued IV therapies and subsequent lab monitoring. Total time spent interpreting labs and vitals, reviewing the medical record, coordinating care amongst consultants and care team members, directly assessing and discussing care with the patient and/or family: 35 min  Laree Lock, MD [facility]  To contact the attending physician between  7A-7P please use Epic Chat. To contact the covering physician during after hours 7P-7A, please review Amion.  12/20/2023, 3:45 PM   *This document has been created with the assistance of dictation software. Please excuse typographical errors. *

## 2023-12-20 NOTE — Plan of Care (Signed)

## 2023-12-21 ENCOUNTER — Encounter: Payer: Self-pay | Admitting: Family Medicine

## 2023-12-21 DIAGNOSIS — Z515 Encounter for palliative care: Secondary | ICD-10-CM | POA: Diagnosis not present

## 2023-12-21 DIAGNOSIS — Z7189 Other specified counseling: Secondary | ICD-10-CM

## 2023-12-21 DIAGNOSIS — S12290A Other displaced fracture of third cervical vertebra, initial encounter for closed fracture: Secondary | ICD-10-CM | POA: Diagnosis not present

## 2023-12-21 DIAGNOSIS — S22080A Wedge compression fracture of T11-T12 vertebra, initial encounter for closed fracture: Secondary | ICD-10-CM | POA: Diagnosis not present

## 2023-12-21 LAB — CBC
HCT: 40.7 % (ref 36.0–46.0)
Hemoglobin: 13.1 g/dL (ref 12.0–15.0)
MCH: 30.6 pg (ref 26.0–34.0)
MCHC: 32.2 g/dL (ref 30.0–36.0)
MCV: 95.1 fL (ref 80.0–100.0)
Platelets: 109 K/uL — ABNORMAL LOW (ref 150–400)
RBC: 4.28 MIL/uL (ref 3.87–5.11)
RDW: 14.2 % (ref 11.5–15.5)
WBC: 8 K/uL (ref 4.0–10.5)
nRBC: 0 % (ref 0.0–0.2)

## 2023-12-21 LAB — BASIC METABOLIC PANEL WITH GFR
Anion gap: 7 (ref 5–15)
BUN: 9 mg/dL (ref 8–23)
CO2: 26 mmol/L (ref 22–32)
Calcium: 8.3 mg/dL — ABNORMAL LOW (ref 8.9–10.3)
Chloride: 108 mmol/L (ref 98–111)
Creatinine, Ser: 0.53 mg/dL (ref 0.44–1.00)
GFR, Estimated: >60 mL/min (ref >60)
Glucose, Bld: 86 mg/dL (ref 70–99)
Potassium: 4 mmol/L (ref 3.5–5.1)
Sodium: 141 mmol/L (ref 135–145)

## 2023-12-21 MED ORDER — MORPHINE SULFATE ER 15 MG PO TBCR
15.0000 mg | EXTENDED_RELEASE_TABLET | Freq: Three times a day (TID) | ORAL | Status: DC
Start: 2023-12-21 — End: 2023-12-24
  Administered 2023-12-21 – 2023-12-24 (×10): 15 mg via ORAL
  Filled 2023-12-21 (×10): qty 1

## 2023-12-21 MED ORDER — ORAL CARE MOUTH RINSE: 15.0000 mL | OROMUCOSAL | Status: DC | PRN

## 2023-12-21 NOTE — Plan of Care (Signed)

## 2023-12-21 NOTE — Care Management Important Message (Signed)
 Important Message  Patient Details  Name: April Soto MRN: [id number] Date of Birth: 1960/05/15   Important Message Given:  Yes - Medicare IM     Ethan Kasperski W, CMA 12/21/2023, 12:12 PM

## 2023-12-21 NOTE — Consult Note (Signed)
 Consultation Note Date: 12/21/2023   Patient Name: April Soto  DOB: 1960/06/27  MRN: [id number]  Age / Sex: 63 y.o., female  PCP: System, Provider Not In Referring Physician: Jerelene Critchley, MD  Reason for Consultation: Establishing goals of care  HPI/Patient Profile: 63 y.o. female  with past medical history of asthma, tobacco abuse quit 3 months ago, chronic low back pain, major depressive disorder, GERD, dyslipidemia, hypertension, fibromyalgia and hypothyroidism  admitted on 12/17/2023 with vertebral compression fracture T12 and chronic low back pain.   Clinical Assessment and Goals of Care: I have reviewed medical records including EPIC notes, labs and imaging, received report from RN, assessed the patient.  April Soto is sitting up in the bed in her room.  She appears chronically ill and somewhat frail, obese.  She greets me, making and mostly keeping eye contact.  She is alert and oriented, able to make her needs known.  There is no family at bedside at this time.  We meet at the bedside to discuss diagnosis pain management.  I introduced Palliative Medicine as specialized medical care for people living with serious illness. It focuses on providing relief from the symptoms and stress of a serious illness. The goal is to improve quality of life for both the patient and the family.  We then focused on their current illness.  April Soto tells me that she sees chronic pain management team in [place].  She states that she recently had a change in her medication and feels that the morphine  is the most effective for her.  The natural disease trajectory and expectations at EOL were discussed.  I attempted to elicit values and goals of care important to the patient.    Advanced directives, concepts specific to code status, were briefly discussed.  Full scope/full code at this point.  Discussed the importance of  continued conversation with family and the medical providers regarding overall plan of care and treatment options, ensuring decisions are within the context of the patient's values and GOCs. Questions and concerns were addressed.  Hard Choices booklet left for review. The family was encouraged to call with questions or concerns.  PMT will continue to support holistically.   HCPOA  NEXT OF KIN -son, Sherwood Painter    SUMMARY OF RECOMMENDATIONS   Continue to treat the treatable, full scope/full code At this point agreeable to short-term rehab at [facility] Symptom management needs addressed   Code Status/Advance Care Planning: Full code  Symptom Management:  MS Contin  15 mg p.o. 3 times daily added. Continue oxycodone  IR 10 mg every 4 hours p.o. as needed breakthrough pain PMT to evaluate if effectiveness 9/9 and anticipate reduce or eliminate IV morphine .  Palliative Prophylaxis:  Frequent Pain Assessment and Oral Care  Additional Recommendations (Limitations, Scope, Preferences): Full Scope Treatment  Psycho-social/Spiritual:  Desire for further Chaplaincy support:no Additional Recommendations: Caregiving  Support/Resources  Prognosis:  > 12 months anticipated based on age and chronic illness burden.  Discharge Planning: At this point, anticipate short-term rehab at  [facility].       Primary Diagnoses: Present on Admission:  Vertebral compression fracture (HCC)   I have reviewed the medical record, interviewed the patient and family, and examined the patient. The following aspects are pertinent.  Past Medical History:  Diagnosis Date   Anemia    years ago   Anxiety    takes Ativan  daily   Asthma    Bipolar 1 disorder (HCC)    Chronic back pain    HNP   Constipation    takes Ra Col Rite daily   Degenerative disc disease    Depression    was on Prozac  but taken off by MD for a couple of weeks   Depression    Fibromyalgia    takes Lyrica  daily    Genital herpes    GERD (gastroesophageal reflux disease)    takes Nexium  daily   Headache(784.0)    Heart murmur    grew out of   History of bronchitis    last time 15+yrs ago   History of rheumatic fever    Hyperlipidemia    takes Simvastatin  daily   Hypertension    takes Coreg  daily   Hypothyroidism    takes Synthroid  daily   Joint pain    Muscle spasm    takes Flexeril  daily as needed   Nocturia    Osteoarthritis    Peripheral edema    takes HCTZ daily   Pneumonia    hx of;last time 30+yrs ago   Raynaud's disease    Rheumatic fever    as a child   Seasonal allergies    takes Zyrtec  daily and uses Dymista as well   Stroke [facility])    ? 5 years ago. No lasting deficits   Urinary incontinence    Weakness    nmbness and tingling-right leg   Social History   Socioeconomic History   Marital status: Divorced    Spouse name: Not on file   Number of children: Not on file   Years of education: Not on file   Highest education level: Associate degree: academic program  Occupational History   Not on file  Tobacco Use   Smoking status: Former    Current packs/day: 1.00    Average packs/day: 1 pack/day for 40.0 years (40.0 ttl pk-yrs)    Types: Cigarettes   Smokeless tobacco: Never  Vaping Use   Vaping status: Never Used  Substance and Sexual Activity   Alcohol use: No   Drug use: No   Sexual activity: Not Currently    Birth control/protection: Surgical  Other Topics Concern   Not on file  Social History Narrative   Not on file   Social Drivers of Health   Financial Resource Strain: High Risk (11/26/2023)   Overall Financial Resource Strain (CARDIA)    Difficulty of Paying Living Expenses: Very hard  Food Insecurity: Food Insecurity Present (12/18/2023)   Hunger Vital Sign    Worried About Running Out of Food in the Last Year: Never true    Ran Out of Food in the Last Year: Sometimes true  Transportation Needs: Unmet Transportation Needs (12/18/2023)   PRAPARE -  Administrator, Civil Service (Medical): Yes    Lack of Transportation (Non-Medical): No  Physical Activity: Unknown (11/26/2023)   Exercise Vital Sign    Days of Exercise per Week: Patient declined    Minutes of Exercise per Session: Not on file  Stress: Patient Declined (11/26/2023)   [place]  [employer] - Occupational Stress Questionnaire    Feeling of Stress: Patient declined  Social Connections: Unknown (11/26/2023)   Social Connection and Isolation Panel    Frequency of Communication with Friends and Family: Patient declined    Frequency of Social Gatherings with Friends and Family: Patient declined    Attends Religious Services: Patient declined    Database administrator or Organizations: Patient declined    Attends Engineer, structural: Not on file    Marital Status: Patient declined   Family History  Problem Relation Age of Onset   Diabetes type II Mother    Hypertension Mother    Diabetes Mother    Diabetes Sister    Hypertension Sister    Hypertension Maternal Grandmother    Diabetes Maternal Grandmother    Breast cancer Neg Hx    Scheduled Meds:  acetaminophen   975 mg Oral Q8H   acyclovir   400 mg Oral Daily   aspirin  EC  81 mg Oral Daily   calcitonin (salmon)  1 spray Alternating Nares Daily   cholecalciferol   1,000 Units Oral Daily   enoxaparin  (LOVENOX ) injection  45 mg Subcutaneous Q24H   fluticasone  furoate-vilanterol  1 puff Inhalation Daily   levothyroxine   88 mcg Oral Q0600   metoprolol  succinate  50 mg Oral QPM   naloxegol  oxalate  25 mg Oral Daily   omega-3 acid ethyl esters  2 capsule Oral BID   pantoprazole   40 mg Oral Daily   polyethylene glycol  17 g Oral BID   pregabalin   25 mg Oral BID   And   pregabalin   75 mg Oral QHS   rosuvastatin   40 mg Oral Daily   sodium chloride  flush  3 mL Intravenous Q12H   venlafaxine  XR  150 mg Oral QPM   ziprasidone   40 mg Oral QPM   Continuous Infusions:  cefTRIAXone   (ROCEPHIN )  IV Stopped (12/21/23 0007)   PRN Meds:.hydrOXYzine , ipratropium-albuterol , morphine  injection, ondansetron  (ZOFRAN ) IV, mouth rinse, oxyCODONE  Medications Prior to Admission:  Prior to Admission medications   Medication Sig Start Date End Date Taking? Authorizing Provider  acetaminophen  (TYLENOL ) 325 MG tablet Take 2 tablets (650 mg total) by mouth every 6 (six) hours as needed for mild pain (pain score 1-3), moderate pain (pain score 4-6), fever or headache (or Fever >/= 101). 08/25/23  Yes Jens Durand, MD  acyclovir  (ZOVIRAX ) 400 MG tablet TAKE 1 TABLET BY MOUTH DAILY 01/23/23  Yes Antonette Angeline ORN, NP  aspirin  EC 81 MG tablet Take 1 tablet (81 mg total) by mouth daily. Swallow whole. 07/03/22  Yes Baity, Angeline ORN, NP  diclofenac  (VOLTAREN ) 75 MG EC tablet Take 1 tablet (75 mg total) by mouth 2 (two) times daily as needed for moderate pain (pain score 4-6). 08/25/23  Yes Jens Durand, MD  esomeprazole  (NEXIUM ) 40 MG capsule TAKE ONE CAPSULE (40 MG TOTAL) BY MOUTH DAILY. 07/31/23  Yes Baity, Angeline ORN, NP  fluticasone  (FLONASE ) 50 MCG/ACT nasal spray INSTILL ONE SPRAY INTO EACH NOSTRIL DAILY AS NEEDED FOR SEASONAL ALLERGY SYMPTOMS 07/01/23  Yes Baity, Angeline ORN, NP  hydrOXYzine  (ATARAX ) 25 MG tablet Take 25 mg by mouth 3 (three) times daily. 12/07/23  Yes [provider]  metoprolol  succinate (TOPROL -XL) 50 MG 24 hr tablet TAKE 1 TABLET BY MOUTH EVERY NIGHT AT   BEDTIME 05/15/23  Yes Baity, Angeline ORN, NP  MOVANTIK  25 MG TABS tablet Take 25 mg by mouth daily. 07/19/20  Yes [provider]  omega-3 acid ethyl esters (LOVAZA ) 1 g capsule TAKE 2 CAPSULES BY MOUTH TWICE A DAY 05/15/23  Yes Baity, Angeline ORN, NP  ondansetron  (ZOFRAN -ODT) 4 MG disintegrating tablet Take 1 tablet (4 mg total) by mouth every 8 (eight) hours as needed for nausea or vomiting. 05/10/21  Yes Viviann Pastor, MD  Oxycodone  HCl 10 MG TABS Take 1 tablet (10 mg total) by mouth every 6 (six) hours as needed.  08/25/23  Yes Jens Durand, MD  pregabalin  (LYRICA ) 100 MG capsule Take 100 mg by mouth 3 (three) times daily. 04/16/21  Yes [provider]  rosuvastatin  (CRESTOR ) 40 MG tablet TAKE 1 TABLET BY MOUTH ONCE A DAY 04/15/23  Yes Antonette Angeline ORN, NP  SYNTHROID  88 MCG tablet TAKE 1 TABLET BY MOUTH ONCE A DAY BEFORE BREAKFAST 05/15/23  Yes Baity, Angeline ORN, NP  venlafaxine  XR (EFFEXOR -XR) 150 MG 24 hr capsule Take 1 capsule (150 mg total) by mouth every evening. For mood control 08/21/17  Yes Money, Caron B, FNP  ziprasidone  (GEODON ) 40 MG capsule TAKE 1 CAPSULE BY MOUTH EVERY EVENING WITH FOOD FOR MOOD 10/01/20  Yes Baity, Angeline ORN, NP  albuterol  (VENTOLIN  HFA) 108 (90 Base) MCG/ACT inhaler Inhale 2 puffs into the lungs every 4 (four) hours as needed for wheezing or shortness of breath.     [provider]  budesonide -formoterol  (SYMBICORT ) 160-4.5 MCG/ACT inhaler Inhale 2 puffs into the lungs daily. 08/25/23   Jens Durand, MD  cetirizine  (ZYRTEC ) 10 MG tablet Take 1 tablet (10 mg total) by mouth daily. 12/17/21   Antonette Angeline ORN, NP  cyclobenzaprine  (FLEXERIL ) 10 MG tablet Take 10 mg by mouth 3 (three) times daily. Patient not taking: Reported on 12/18/2023 12/03/23   [provider]  hydrocortisone  (ANUSOL -HC) 2.5 % rectal cream Apply 1 application topically as needed for hemorrhoids.     [provider]  morphine  (MS CONTIN ) 15 MG 12 hr tablet Take 1 tablet (15 mg total) by mouth 3 (three) times daily. Patient not taking: Reported on 12/18/2023 08/25/23   Jens Durand, MD  nicotine  (NICODERM CQ  - DOSED IN MG/24 HOURS) 21 mg/24hr patch Place 1 patch (21 mg total) onto the skin daily. Patient not taking: Reported on 12/18/2023 08/26/23   Jens Durand, MD  Olopatadine  HCl 0.2 % SOLN Place 1 drop into both eyes at bedtime as needed (allergies).    [provider]  XTAMPZA  ER 9 MG C12A Take 1 capsule by mouth 2 (two) times daily. Patient not taking: Reported on 12/18/2023  10/30/23   [provider]   Allergies  Allergen Reactions   Levothyroxine  Dermatitis    depression   Review of Systems  Unable to perform ROS: Other    Physical Exam Vitals and nursing note reviewed.     Vital Signs: BP 120/66 (BP Location: Right Arm)   Pulse 86   Temp (!) 97.5 F (36.4 C)   Resp 15   Ht 5' 6 (1.676 m)   Wt 93.9 kg   SpO2 93%   BMI 33.41 kg/m  Pain Scale: 0-10   Pain Score: 4    SpO2: SpO2: 93 % O2 Device:SpO2: 93 % O2 Flow Rate: .   IO: Intake/output summary:  Intake/Output Summary (Last 24 hours) at 12/21/2023 0920 Last data filed at 12/21/2023 0122 Gross per 24 hour  Intake 1290.29 ml  Output --  Net 1290.29 ml    LBM: Last BM Date : 12/21/23 Baseline Weight: Weight: 93.9 kg Most recent weight:  Weight: 93.9 kg     Palliative Assessment/Data:     Time In: 1400  Time Out: 1455 Time Total: 55 minutes  Greater than 50%  of this time was spent counseling and coordinating care related to the above assessment and plan.  Signed by: Lorenza DELENA Birkenhead, NP   Please contact Palliative Medicine Team phone at [phone] for questions and concerns.  For individual provider: See Tracey

## 2023-12-21 NOTE — Progress Notes (Addendum)
 PROGRESS NOTE    April Soto  FMW:[id number] DOB: 1961-01-27 DOA: 12/17/2023 PCP: System, Provider Not In  Chief Complaint  Patient presents with   [facility] Course:  April Soto is a 63 y.o. female with medical history significant of asthma, tobacco abuse quit 3 months ago, chronic low back pain, major depressive disorder, GERD, dyslipidemia, hypertension, fibromyalgia and hypothyroidism.  Was admitted 05/25 due to presumed fall secondary to polypharmacy. Presented s/p mechanical fall, found to have age-indeterminate T12 compression fracture.  TLSO brace applied. Seen by Neurosurgery. Reported having occasional DOE with generalized weakness and dizziness for months.  Was found to be hypoxic with sats 90%, imaging with possible LUL pneumonia.  Hospital course as below   Subjective: Patient was examined at bedside.  States pain poorly controlled, requiring multiple doses of IV morphine . Palliative consulted for pain management   Objective: Vitals:   12/20/23 1508 12/20/23 2000 12/21/23 0426 12/21/23 0725  BP: 95/63 119/73 105/65 120/66  Pulse: 93 94 87 86  Resp: 18 16 16 15   Temp: 98.4 F (36.9 C) 97.9 F (36.6 C) (!) 97.4 F (36.3 C) (!) 97.5 F (36.4 C)  TempSrc: Oral     SpO2: 94% 96% 92% 93%  Weight:      Height:        Intake/Output Summary (Last 24 hours) at 12/21/2023 1408 Last data filed at 12/21/2023 1044 Gross per 24 hour  Intake 1290.29 ml  Output --  Net 1290.29 ml   Filed Weights   12/17/23 2036  Weight: 93.9 kg    Examination: Constitutional: NAD, calm, uncomfortable Respiratory: clear to auscultation bilaterally anteriorly, no wheezing, no crackles Cardiovascular: Regular rate and rhythm, no murmurs / rubs / gallops. No extremity edema Abdomen: no tenderness, no masses palpated. No hepatosplenomegaly. Bowel sounds positive.  Musculoskeletal: no clubbing / cyanosis. No joint deformity upper and lower extremities. Good ROM, no contractures.  Normal muscle tone.  Significant back pain around level of waist reproducible with any movement.  Due to application of TLSO brace unable to palpate over same area Skin: no rashes, lesions, ulcers. No induration Neurologic: CN 2-12 grossly intact. Sensation intact,  Strength 5/5 x all 4 extremities.  Ability to reposition self in bed limited by back pain   Assessment & Plan:  Principal Problem:   Vertebral compression fracture (HCC) Active Problems:   T12 compression fracture, initial encounter (HCC)  Acute intractable low back pain in setting of T12 compression fracture History of chronic low back pain and fibromyalgia - States current pain severe than her usual chronic pain - s/p mechanical fall, CT head, C-spine, right hip negative for acute changes - CT chest with age-indeterminate T12 vertebral compression fracture - By neurosurgery, appreciate recs. not a candidate for kyphoplasty.  Recommend TLSO brace - Tylenol  TID, Oxycodone  10 mg at home q6h, increase to every 4h, IV morphine  q3 for breakthrough pain, Lyrica  25 during the day, 75mg  at night. Palliayive consulted for pain management - Calcitonin, vitamin D  low normal, start daily supplements - Follow-up with neurosurgery outpatient - PT/OT rec SNF  Acute hypoxia - resolved Evolving left upper lobe pneumonia Possible chronic bronchitis/known asthma - Suspect multifactorial due to poor respiratory effort from pain, on opioids, possible pneumonia - No leukocytosis, afebrile, procalcitonin negative, RVP negative. Denies symptoms of UTI - BNP not elevated, echo shows EF 60 to 65%, grade 2 DD - CT with trace LUL consolidation, may represent developing infection/inflammation - Continue Ceftriaxone , Azithromycin   Possible COPD - Chronic smoker, quit 3 months ago - Will need PFTs after discharge, pulmonary referral - Continue Breo Ellipta  and add DuoNebs every 6 hours as needed  Chronic Dizziness - chronic dizziness for months  now, suspect due to polypharmacy - MS Contin  has been discontinued - Decrease dose of Lyrica , change Hydroxyzine  to prn, hold Flexeril .  Continue oxycodone , IV morphine  as needed  Staph epidermidis bacteremia -suspect contaminant -BCx 09/05 (1/2) with Staph epidermidis, likely contaminant -Repeat blood culture NGTD   Thrombocytopenia - Has been having intermittent thrombocytopenia for several years - Monitor platelets   Hypertension - Continue Toprol -XL   Dyslipidemia - Continue Crestor  and omega-3 fatty acid   Hypothyroidism - Continue Synthroid   Constipation - Resume Movantik , MiraLAX   DVT prophylaxis: Lovenox  SQ   Code Status: Full Code Disposition: SNF  Consultants:  Neurosurgery  Procedures:  None  Antimicrobials:  Anti-infectives (From admission, onward)    Start     Dose/Rate Route Frequency Ordered Stop   12/21/23 0000  azithromycin  (ZITHROMAX ) 500 mg in sodium chloride  0.9 % 250 mL IVPB        500 mg 250 mL/hr over 60 Minutes Intravenous Every 24 hours 12/20/23 1547 12/21/23 0121   12/21/23 0000  cefTRIAXone  (ROCEPHIN ) 2 g in sodium chloride  0.9 % 100 mL IVPB        2 g 200 mL/hr over 30 Minutes Intravenous Every 24 hours 12/20/23 1547 12/23/23 2359   12/19/23 1800  acyclovir  (ZOVIRAX ) 200 MG capsule 400 mg        400 mg Oral Daily 12/19/23 1526     12/19/23 0000  cefTRIAXone  (ROCEPHIN ) 1 g in sodium chloride  0.9 % 100 mL IVPB  Status:  Discontinued        1 g 200 mL/hr over 30 Minutes Intravenous Every 24 hours 12/18/23 0943 12/18/23 1613   12/19/23 0000  azithromycin  (ZITHROMAX ) 500 mg in sodium chloride  0.9 % 250 mL IVPB  Status:  Discontinued        500 mg 250 mL/hr over 60 Minutes Intravenous Every 24 hours 12/18/23 0943 12/20/23 1547   12/19/23 0000  cefTRIAXone  (ROCEPHIN ) 2 g in sodium chloride  0.9 % 100 mL IVPB  Status:  Discontinued        2 g 200 mL/hr over 30 Minutes Intravenous Every 24 hours 12/18/23 1613 12/20/23 1547   12/18/23 0145   cefTRIAXone  (ROCEPHIN ) 2 g in sodium chloride  0.9 % 100 mL IVPB        2 g 200 mL/hr over 30 Minutes Intravenous  Once 12/18/23 0139 12/18/23 0251   12/18/23 0145  azithromycin  (ZITHROMAX ) 500 mg in sodium chloride  0.9 % 250 mL IVPB        500 mg 250 mL/hr over 60 Minutes Intravenous  Once 12/18/23 0139 12/18/23 0359       Data Reviewed: I have personally reviewed following labs and imaging studies CBC: Recent Labs  Lab 12/18/23 0037 12/19/23 0742 12/21/23 0442  WBC 10.2 7.1 8.0  NEUTROABS 6.9  --   --   HGB 15.5* 13.8 13.1  HCT 48.0* 42.4 40.7  MCV 94.9 94.2 95.1  PLT 121* 111* 109*   Basic Metabolic Panel: Recent Labs  Lab 12/18/23 0037 12/19/23 0742 12/21/23 0442  NA 141 139 141  K 4.2 3.9 4.0  CL 101 105 108  CO2 26 29 26   GLUCOSE 123* 130* 86  BUN 7* 10 9  CREATININE 0.72 0.62 0.53  CALCIUM  9.0 8.4* 8.3*   GFR:  Estimated Creatinine Clearance: 82 mL/min (by C-G formula based on SCr of 0.53 mg/dL). Liver Function Tests: Recent Labs  Lab 12/18/23 0037 12/19/23 0742  AST 34 21  ALT 27 19  ALKPHOS 76 57  BILITOT 1.1 0.8  PROT 6.9 5.9*  ALBUMIN 3.7 3.1*   CBG: Recent Labs  Lab 12/19/23 0800  GLUCAP 119*    Recent Results (from the past 240 hours)  Resp panel by RT-PCR (RSV, Flu A&B, Covid) Anterior Nasal Swab     Status: None   Collection Time: 12/18/23 12:37 AM   Specimen: Anterior Nasal Swab  Result Value Ref Range Status   SARS Coronavirus 2 by RT PCR NEGATIVE NEGATIVE Final    Comment: (NOTE) SARS-CoV-2 target nucleic acids are NOT DETECTED.  The SARS-CoV-2 RNA is generally detectable in upper respiratory specimens during the acute phase of infection. The lowest concentration of SARS-CoV-2 viral copies this assay can detect is 138 copies/mL. A negative result does not preclude SARS-Cov-2 infection and should not be used as the sole basis for treatment or other patient management decisions. A negative result may occur with  improper  specimen collection/handling, submission of specimen other than nasopharyngeal swab, presence of viral mutation(s) within the areas targeted by this assay, and inadequate number of viral copies(<138 copies/mL). A negative result must be combined with clinical observations, patient history, and epidemiological information. The expected result is Negative.  Fact Sheet for Patients:  BloggerCourse.com  Fact Sheet for Healthcare Providers:  SeriousBroker.it  This test is no t yet approved or cleared by the [place]  FDA and  has been authorized for detection and/or diagnosis of SARS-CoV-2 by FDA under an Emergency Use Authorization (EUA). This EUA will remain  in effect (meaning this test can be used) for the duration of the COVID-19 declaration under Section 564(b)(1) of the Act, 21 U.S.C.section 360bbb-3(b)(1), unless the authorization is terminated  or revoked sooner.       Influenza A by PCR NEGATIVE NEGATIVE Final   Influenza B by PCR NEGATIVE NEGATIVE Final    Comment: (NOTE) The Xpert Xpress SARS-CoV-2/FLU/RSV plus assay is intended as an aid in the diagnosis of influenza from Nasopharyngeal swab specimens and should not be used as a sole basis for treatment. Nasal washings and aspirates are unacceptable for Xpert Xpress SARS-CoV-2/FLU/RSV testing.  Fact Sheet for Patients: BloggerCourse.com  Fact Sheet for Healthcare Providers: SeriousBroker.it  This test is not yet approved or cleared by the [place]  FDA and has been authorized for detection and/or diagnosis of SARS-CoV-2 by FDA under an Emergency Use Authorization (EUA). This EUA will remain in effect (meaning this test can be used) for the duration of the COVID-19 declaration under Section 564(b)(1) of the Act, 21 U.S.C. section 360bbb-3(b)(1), unless the authorization is terminated or revoked.     Resp  Syncytial Virus by PCR NEGATIVE NEGATIVE Final    Comment: (NOTE) Fact Sheet for Patients: BloggerCourse.com  Fact Sheet for Healthcare Providers: SeriousBroker.it  This test is not yet approved or cleared by the [place]  FDA and has been authorized for detection and/or diagnosis of SARS-CoV-2 by FDA under an Emergency Use Authorization (EUA). This EUA will remain in effect (meaning this test can be used) for the duration of the COVID-19 declaration under Section 564(b)(1) of the Act, 21 U.S.C. section 360bbb-3(b)(1), unless the authorization is terminated or revoked.  Performed at [facility], [address] [place], [place] [address]   Blood culture (routine x 2)  Status: Abnormal   Collection Time: 12/18/23 12:38 AM   Specimen: BLOOD  Result Value Ref Range Status   Specimen Description   Final    BLOOD BLOOD RIGHT ARM Performed at [facility], [address] [place], [place] [address]    Special Requests   Final    BOTTLES DRAWN AEROBIC AND ANAEROBIC Blood Culture adequate volume Performed at [facility], [address] [place], [place] [address]    Culture  Setup Time   Final    GRAM POSITIVE COCCI AEROBIC BOTTLE ONLY CRITICAL RESULT CALLED TO, READ BACK BY AND VERIFIED WITH: NATHAN BELUE @ 12/18/2023 2317 AB    Culture (A)  Final    STAPHYLOCOCCUS EPIDERMIDIS THE SIGNIFICANCE OF ISOLATING THIS ORGANISM FROM A SINGLE SET OF BLOOD CULTURES WHEN MULTIPLE SETS ARE DRAWN IS UNCERTAIN. PLEASE NOTIFY THE MICROBIOLOGY DEPARTMENT WITHIN ONE WEEK IF SPECIATION AND SENSITIVITIES ARE REQUIRED. Performed at [facility], [address], [place], [place] [address]    Report Status 12/20/2023 FINAL  Final  Blood culture (routine x 2)     Status: None (Preliminary result)   Collection Time: 12/18/23 12:38 AM   Specimen: BLOOD  Result Value Ref Range Status   Specimen Description  BLOOD BLOOD LEFT ARM  Final   Special Requests   Final    BOTTLES DRAWN AEROBIC AND ANAEROBIC Blood Culture results may not be optimal due to an inadequate volume of blood received in culture bottles   Culture   Final    NO GROWTH 3 DAYS Performed at [facility], [address], [place], [place] [address]    Report Status PENDING  Incomplete  Blood Culture ID Panel (Reflexed)     Status: Abnormal   Collection Time: 12/18/23 12:38 AM  Result Value Ref Range Status   Enterococcus faecalis NOT DETECTED NOT DETECTED Final   Enterococcus Faecium NOT DETECTED NOT DETECTED Final   Listeria monocytogenes NOT DETECTED NOT DETECTED Final   Staphylococcus species DETECTED (A) NOT DETECTED Final    Comment: CRITICAL RESULT CALLED TO, READ BACK BY AND VERIFIED WITH: NATHAN BELUE @ 12/18/2023 2317 AB    Staphylococcus aureus (BCID) NOT DETECTED NOT DETECTED Final   Staphylococcus epidermidis DETECTED (A) NOT DETECTED Final    Comment: CRITICAL RESULT CALLED TO, READ BACK BY AND VERIFIED WITH: NATHAN BELUE @ 12/18/2023 2317 AB    Staphylococcus lugdunensis NOT DETECTED NOT DETECTED Final   Streptococcus species NOT DETECTED NOT DETECTED Final   Streptococcus agalactiae NOT DETECTED NOT DETECTED Final   Streptococcus pneumoniae NOT DETECTED NOT DETECTED Final   Streptococcus pyogenes NOT DETECTED NOT DETECTED Final   A.calcoaceticus-baumannii NOT DETECTED NOT DETECTED Final   Bacteroides fragilis NOT DETECTED NOT DETECTED Final   Enterobacterales NOT DETECTED NOT DETECTED Final   Enterobacter cloacae complex NOT DETECTED NOT DETECTED Final   Escherichia coli NOT DETECTED NOT DETECTED Final   Klebsiella aerogenes NOT DETECTED NOT DETECTED Final   Klebsiella oxytoca NOT DETECTED NOT DETECTED Final   Klebsiella pneumoniae NOT DETECTED NOT DETECTED Final   Proteus species NOT DETECTED NOT DETECTED Final   Salmonella species NOT DETECTED NOT DETECTED Final   Serratia marcescens NOT  DETECTED NOT DETECTED Final   Haemophilus influenzae NOT DETECTED NOT DETECTED Final   Neisseria meningitidis NOT DETECTED NOT DETECTED Final   Pseudomonas aeruginosa NOT DETECTED NOT DETECTED Final   Stenotrophomonas maltophilia NOT DETECTED NOT DETECTED Final   Candida albicans NOT DETECTED NOT DETECTED Final  Candida auris NOT DETECTED NOT DETECTED Final   Candida glabrata NOT DETECTED NOT DETECTED Final   Candida krusei NOT DETECTED NOT DETECTED Final   Candida parapsilosis NOT DETECTED NOT DETECTED Final   Candida tropicalis NOT DETECTED NOT DETECTED Final   Cryptococcus neoformans/gattii NOT DETECTED NOT DETECTED Final   Methicillin resistance mecA/C NOT DETECTED NOT DETECTED Final    Comment: Performed at [facility], [address] [place], [place] [address]  Respiratory (~20 pathogens) panel by PCR     Status: None   Collection Time: 12/19/23  9:37 AM   Specimen: Nasopharyngeal Swab; Respiratory  Result Value Ref Range Status   Adenovirus NOT DETECTED NOT DETECTED Final   Coronavirus 229E NOT DETECTED NOT DETECTED Final    Comment: (NOTE) The Coronavirus on the Respiratory Panel, DOES NOT test for the novel  Coronavirus (2019 nCoV)    Coronavirus HKU1 NOT DETECTED NOT DETECTED Final   Coronavirus NL63 NOT DETECTED NOT DETECTED Final   Coronavirus OC43 NOT DETECTED NOT DETECTED Final   Metapneumovirus NOT DETECTED NOT DETECTED Final   Rhinovirus / Enterovirus NOT DETECTED NOT DETECTED Final   Influenza A NOT DETECTED NOT DETECTED Final   Influenza B NOT DETECTED NOT DETECTED Final   Parainfluenza Virus 1 NOT DETECTED NOT DETECTED Final   Parainfluenza Virus 2 NOT DETECTED NOT DETECTED Final   Parainfluenza Virus 3 NOT DETECTED NOT DETECTED Final   Parainfluenza Virus 4 NOT DETECTED NOT DETECTED Final   Respiratory Syncytial Virus NOT DETECTED NOT DETECTED Final   Bordetella pertussis NOT DETECTED NOT DETECTED Final   Bordetella Parapertussis NOT DETECTED NOT  DETECTED Final   Chlamydophila pneumoniae NOT DETECTED NOT DETECTED Final   Mycoplasma pneumoniae NOT DETECTED NOT DETECTED Final    Comment: Performed at [facility], [address] [place], [place] [address]  Culture, blood (single) w Reflex to ID Panel     Status: None (Preliminary result)   Collection Time: 12/19/23  9:59 AM   Specimen: BLOOD  Result Value Ref Range Status   Specimen Description BLOOD BLOOD RIGHT HAND  Final   Special Requests   Final    BOTTLES DRAWN AEROBIC AND ANAEROBIC Blood Culture adequate volume   Culture   Final    NO GROWTH 2 DAYS Performed at [facility], [address] [place], [place] [address]    Report Status PENDING  Incomplete     Radiology Studies: No results found.   Scheduled Meds:  acetaminophen   975 mg Oral Q8H   acyclovir   400 mg Oral Daily   aspirin  EC  81 mg Oral Daily   calcitonin (salmon)  1 spray Alternating Nares Daily   cholecalciferol   1,000 Units Oral Daily   enoxaparin  (LOVENOX ) injection  45 mg Subcutaneous Q24H   fluticasone  furoate-vilanterol  1 puff Inhalation Daily   levothyroxine   88 mcg Oral Q0600   metoprolol  succinate  50 mg Oral QPM   naloxegol  oxalate  25 mg Oral Daily   omega-3 acid ethyl esters  2 capsule Oral BID   pantoprazole   40 mg Oral Daily   polyethylene glycol  17 g Oral BID   pregabalin   25 mg Oral BID   And   pregabalin   75 mg Oral QHS   rosuvastatin   40 mg Oral Daily   sodium chloride  flush  3 mL Intravenous Q12H   venlafaxine  XR  150 mg Oral QPM   ziprasidone   40 mg Oral QPM   Continuous Infusions:  cefTRIAXone  (ROCEPHIN )  IV Stopped (12/21/23 0007)     LOS: 3 days  MDM: Patient is high risk for one or more organ failure.  They necessitate ongoing hospitalization for continued IV therapies and subsequent lab monitoring. Total time spent interpreting labs and vitals, reviewing the medical record, coordinating care amongst consultants and care team members, directly  assessing and discussing care with the patient and/or family: 35 min  Laree Lock, MD [facility]  To contact the attending physician between 7A-7P please use Epic Chat. To contact the covering physician during after hours 7P-7A, please review Amion.  12/21/2023, 2:08 PM   *This document has been created with the assistance of dictation software. Please excuse typographical errors. *

## 2023-12-21 NOTE — Progress Notes (Addendum)
 PT Cancellation Note  Patient Details Name: April Soto MRN: [id number] DOB: 28-Sep-1960   Cancelled Treatment:     PT treatment not complete 2/2 to pt is 8/10 pain and refused to move. Pt reproted she will get OOB with Nursing after she takes pain meds. PT discussed the significance of compliance to OOB activities and healing process. Pt mildly agitated but agreed to attempt tomorrow.  PT notified Nursing about pt's pain and unwillingness.    Earlee Herald H Lynnann Knudsen 12/21/2023, 4:58 PM

## 2023-12-21 NOTE — Progress Notes (Signed)
 OT Cancellation Note  Patient Details Name: April Soto MRN: [id number] DOB: 1960-06-20   Cancelled Treatment:    Reason Eval/Treat Not Completed: Pain limiting ability to participate;Patient declined, no reason specified. First attempt pt declined 2/2 nausea. 2nd attempt this afternoon pt declined 2/2 pain. RN notified. Will re-attempt as able.   Reylynn Vanalstine R., MPH, MS, OTR/L ascom [phone] 12/21/23, 3:57 PM

## 2023-12-21 NOTE — TOC Progression Note (Signed)
 Transition of Care [facility]) - Progression Note    Patient Details  Name: April Soto MRN: [id number] Date of Birth: Sep 05, 1960  [facility] CM/SW Contact  [place] L Qunisha Bry[place], [place] Phone Number: 12/21/2023, 11:21 AM  Clinical Narrative:     CSW met with patient. No family at bedside. SNF offers discussed with patient. Patient confirmed that she would like to accept bed at [facility].   CSW followed up with attending. Patient is not medically stable for discharge. Auth not started.                     Expected Discharge Plan and Services                                               Social Drivers of Health (SDOH) Interventions SDOH Screenings   Food Insecurity: Food Insecurity Present (12/18/2023)  Housing: High Risk (12/18/2023)  Transportation Needs: Unmet Transportation Needs (12/18/2023)  Utilities: Not At Risk (12/18/2023)  Alcohol Screen: Low Risk  (02/20/2023)  Depression (PHQ2-9): High Risk (02/20/2023)  Financial Resource Strain: High Risk (11/26/2023)  Physical Activity: Unknown (11/26/2023)  Social Connections: Unknown (11/26/2023)  Stress: Patient Declined (11/26/2023)  Tobacco Use: Medium Risk (12/21/2023)    Readmission Risk Interventions     No data to display

## 2023-12-22 ENCOUNTER — Inpatient Hospital Stay

## 2023-12-22 DIAGNOSIS — S12290A Other displaced fracture of third cervical vertebra, initial encounter for closed fracture: Secondary | ICD-10-CM | POA: Diagnosis not present

## 2023-12-22 DIAGNOSIS — Z7189 Other specified counseling: Secondary | ICD-10-CM | POA: Diagnosis not present

## 2023-12-22 DIAGNOSIS — Z515 Encounter for palliative care: Secondary | ICD-10-CM | POA: Diagnosis not present

## 2023-12-22 DIAGNOSIS — S22080A Wedge compression fracture of T11-T12 vertebra, initial encounter for closed fracture: Secondary | ICD-10-CM | POA: Diagnosis not present

## 2023-12-22 DIAGNOSIS — R52 Pain, unspecified: Secondary | ICD-10-CM | POA: Diagnosis not present

## 2023-12-22 MED ORDER — SIMETHICONE 80 MG PO CHEW
80.0000 mg | CHEWABLE_TABLET | Freq: Four times a day (QID) | ORAL | Status: DC | PRN
  Administered 2023-12-22 – 2023-12-24 (×2): 80 mg via ORAL
  Filled 2023-12-22 (×2): qty 1

## 2023-12-22 MED ORDER — SENNOSIDES-DOCUSATE SODIUM 8.6-50 MG PO TABS
2.0000 | ORAL_TABLET | Freq: Two times a day (BID) | ORAL | Status: DC
  Administered 2023-12-23 – 2023-12-24 (×2): 2 via ORAL
  Filled 2023-12-22 (×4): qty 2

## 2023-12-22 NOTE — Plan of Care (Signed)
 Patient seems to be managing her pain well.     Problem: Education: Goal: Knowledge of General Education information will improve Description: Including pain rating scale, medication(s)/side effects and non-pharmacologic comfort measures Outcome: Progressing   Problem: Health Behavior/Discharge Planning: Goal: Ability to manage health-related needs will improve Outcome: Progressing   Problem: Clinical Measurements: Goal: Ability to maintain clinical measurements within normal limits will improve Outcome: Progressing Goal: Will remain free from infection Outcome: Progressing Goal: Diagnostic test results will improve Outcome: Progressing Goal: Respiratory complications will improve Outcome: Progressing Goal: Cardiovascular complication will be avoided Outcome: Progressing   Problem: Activity: Goal: Risk for activity intolerance will decrease Outcome: Progressing   Problem: Nutrition: Goal: Adequate nutrition will be maintained Outcome: Progressing   Problem: Coping: Goal: Level of anxiety will decrease Outcome: Progressing   Problem: Elimination: Goal: Will not experience complications related to bowel motility Outcome: Progressing Goal: Will not experience complications related to urinary retention Outcome: Progressing   Problem: Pain Managment: Goal: General experience of comfort will improve and/or be controlled Outcome: Progressing   Problem: Safety: Goal: Ability to remain free from injury will improve Outcome: Progressing   Problem: Skin Integrity: Goal: Risk for impaired skin integrity will decrease Outcome: Progressing

## 2023-12-22 NOTE — Progress Notes (Signed)
 Palliative: April Soto is lying quietly in bed.  She appears chronically ill and frail, morbidly obese.  She greets me, making and mostly keeping eye contact.  She is alert and oriented, able to make her needs known.  She has bedside nursing staff present, attending to needs.  We talked about her symptom management needs.  She tells me that the by mouth medication has been effective in controlling her pain.  Per chart she has used breakthrough pain medication twice yesterday.  I encouraged her for mobility and short-term rehab.  April Soto tells me that she is excited for rehab.  Conference with attending, bedside nursing staff, transition of care team related to patient condition, needs, goals of care, disposition.  Plan: Continue to treat the treatable, full scope/full code.  Short-term rehab at [facility] with the ultimate goal of returning home.  50 minutes  Justn Quale, NP Palliative medicine team Team phone [phone]

## 2023-12-22 NOTE — TOC Progression Note (Signed)
 Transition of Care [facility]) - Progression Note    Patient Details  Name: April Soto MRN: [id number] Date of Birth: 05/08/1960  Transition [facility]) CM/SW Contact  Racheal LITTIE Schimke, RN Phone Number: 12/22/2023, 1:59 PM  Clinical Narrative:  Ins. Auth for [facility] started.   Expected Discharge Plan and Services                                     Social Drivers of Health (SDOH) Interventions SDOH Screenings   Food Insecurity: Food Insecurity Present (12/18/2023)  Housing: High Risk (12/18/2023)  Transportation Needs: Unmet Transportation Needs (12/18/2023)  Utilities: Not At Risk (12/18/2023)  Alcohol Screen: Low Risk  (02/20/2023)  Depression (PHQ2-9): High Risk (02/20/2023)  Financial Resource Strain: High Risk (11/26/2023)  Physical Activity: Unknown (11/26/2023)  Social Connections: Unknown (11/26/2023)  Stress: Patient Declined (11/26/2023)  Tobacco Use: Medium Risk (12/21/2023)    Readmission Risk Interventions     No data to display

## 2023-12-22 NOTE — Progress Notes (Signed)
 Occupational Therapy Treatment Patient Details Name: April Soto MRN: [id number] DOB: 1961-04-05 Today's Date: 12/22/2023   History of present illness 62yoF who comes to ED after slip in bathroom and fall with subsequent back pain. Workup revealing of age-indeterminate T12 40% vertebral body height loss consistent with acute compression fracture. Patient has been reporting significant pain. A TLSO brace was applied in the ED. PMH: asthma, tobacco abuse quit 3 months ago, chronic low back pain, major depressive disorder, GERD, dyslipidemia, hypertension, fibromyalgia and hypothyroidism.   OT comments  Pt seen for OT tx and co-tx with PT to optimize safety and participation due to decreased activity tolerance and pain. Pt reports feeling a bit better today aside from abdominal discomfort. Agreeable to session. Pt required MOD A for bed mobility, MOD A For donning TLSO, MIN A for gown change, MAX A for donning socks, MIN A +2 for STS from EOB with VC for hand placement, and CGA +RW for short distance mobility. Pt edu in activity pacing and pain mgt strategies while ambulating to optimize safety and indep with ADL and mobility. Edu in AE/DME for LB ADL tasks. Pt verbalized understanding. Set up for grooming near end of session. Pt did endorse some mild nausea after mobility but resolved. Pt continues to benefit from skilled OT to optimize return to PLOF.       If plan is discharge home, recommend the following:  A lot of help with walking and/or transfers;A lot of help with bathing/dressing/bathroom   Equipment Recommendations  BSC/3in1    Recommendations for Other Services      Precautions / Restrictions Precautions Precautions: Fall;Back Precaution Booklet Issued: No Recall of Precautions/Restrictions: Intact Required Braces or Orthoses: Spinal Brace Spinal Brace: Thoracolumbosacral orthotic Restrictions Weight Bearing Restrictions Per Provider Order: No       Mobility Bed  Mobility Overal bed mobility: Needs Assistance Bed Mobility: Rolling, Sidelying to Sit Rolling: Mod assist Sidelying to sit: Mod assist            Transfers Overall transfer level: Needs assistance Equipment used: Rolling walker (2 wheels) Transfers: Sit to/from Stand Sit to Stand: Min assist, +2 physical assistance           General transfer comment: cues for safe hand placement. TLSO donned with maximal assistance prior to mobilizing     Balance Overall balance assessment: Needs assistance Sitting-balance support: No upper extremity supported, Feet supported Sitting balance-Leahy Scale: Fair     Standing balance support: Bilateral upper extremity supported, Reliant on assistive device for balance Standing balance-Leahy Scale: Fair Standing balance comment: reliance on rolling walker for support                           ADL either performed or assessed with clinical judgement   ADL Overall ADL's : Needs assistance/impaired     Grooming: Sitting;Set up;Wash/dry face;Oral care           Upper Body Dressing : Sitting;Minimal assistance Upper Body Dressing Details (indicate cue type and reason): gown change Lower Body Dressing: Bed level;Maximal assistance Lower Body Dressing Details (indicate cue type and reason): MAX A to don socks             Functional mobility during ADLs: Rolling walker (2 wheels);Contact guard assist      Extremity/Trunk Assessment              Vision       Perception  Praxis     Communication Communication Communication: No apparent difficulties   Cognition Arousal: Alert Behavior During Therapy: WFL for tasks assessed/performed Cognition: No apparent impairments                               Following commands: Intact        Cueing   Cueing Techniques: Verbal cues  Exercises Other Exercises Other Exercises: Pt edu in activity pacing and pain mgt strategies to optimize safety and  indep with ADL and mobility. Edu in AE/DME for LB ADL tasks.    Shoulder Instructions       General Comments      Pertinent Vitals/ Pain       Pain Assessment Pain Assessment: Faces Faces Pain Scale: Hurts little more Pain Location: abdomen Pain Descriptors / Indicators: Discomfort Pain Intervention(s): Limited activity within patient's tolerance, Monitored during session, Premedicated before session, Repositioned  Home Living                                          Prior Functioning/Environment              Frequency  Min 2X/week        Progress Toward Goals  OT Goals(current goals can now be found in the care plan section)  Progress towards OT goals: Progressing toward goals  Acute Rehab OT Goals Patient Stated Goal: go to rehab OT Goal Formulation: With patient Time For Goal Achievement: 01/01/24 Potential to Achieve Goals: Good  Plan      Co-evaluation    PT/OT/SLP Co-Evaluation/Treatment: Yes Reason for Co-Treatment: To address functional/ADL transfers (limited activity tolerance) PT goals addressed during session: Mobility/safety with mobility OT goals addressed during session: ADL's and self-care      AM-PAC OT 6 Clicks Daily Activity     Outcome Measure   Help from another person eating meals?: None Help from another person taking care of personal grooming?: A Little Help from another person toileting, which includes using toliet, bedpan, or urinal?: A Lot Help from another person bathing (including washing, rinsing, drying)?: A Lot Help from another person to put on and taking off regular upper body clothing?: A Little Help from another person to put on and taking off regular lower body clothing?: A Lot 6 Click Score: 16    End of Session Equipment Utilized During Treatment: Rolling walker (2 wheels)  OT Visit Diagnosis: Other abnormalities of gait and mobility (R26.89);Muscle weakness (generalized) (M62.81)    Activity Tolerance Patient tolerated treatment well;Patient limited by pain   Patient Left in chair;with call bell/phone within reach;with chair alarm set   Nurse Communication          Time: 8580-8556 OT Time Calculation (min): 24 min  Charges: OT General Charges $OT Visit: 1 Visit OT Treatments $Therapeutic Activity: 8-22 mins  Warren SAUNDERS., MPH, MS, OTR/L ascom [phone] 12/22/23, 3:17 PM,

## 2023-12-22 NOTE — Progress Notes (Addendum)
 PROGRESS NOTE    April Soto  FMW:[id number] DOB: [birth year] DOA: 12/17/2023 PCP: System, Provider Not In  Chief Complaint  Patient presents with   [facility] Course:  April Soto is a 63 y.o. female with medical history significant of asthma, tobacco abuse quit 3 months ago, chronic low back pain, major depressive disorder, GERD, dyslipidemia, hypertension, fibromyalgia and hypothyroidism.  Was admitted 05/25 due to presumed fall secondary to polypharmacy. Presented s/p mechanical fall, found to have age-indeterminate T12 compression fracture.  TLSO brace applied. Seen by Neurosurgery. Reported having occasional DOE with generalized weakness and dizziness for months.  Was found to be hypoxic with sats 90%, imaging with possible LUL pneumonia.  Hospital course as below   Subjective: Patient was examined at bedside.  Pain controlled with MS Contin . Reports nausea, abdominal pain and tenderness, did not have BM since 2 to 3 days.  Will get KUB to rule out obstruction and bowel regimen TOC working on placement, Called Niece - no answer   Objective: Vitals:   12/21/23 2009 12/22/23 0453 12/22/23 0814 12/22/23 1724  BP: 102/67 (!) 87/54 123/75 118/84  Pulse: 98 80 88 91  Resp: 16 16 16 16   Temp:  97.7 F (36.5 C) 98 F (36.7 C) 98.3 F (36.8 C)  TempSrc:   Oral   SpO2:  95% 95% 93%  Weight:      Height:       No intake or output data in the 24 hours ending 12/22/23 1841  Filed Weights   12/17/23 2036  Weight: 93.9 kg    Examination: Constitutional: NAD, calm, uncomfortable Respiratory: clear to auscultation bilaterally anteriorly, no wheezing, no crackles Cardiovascular: Regular rate and rhythm, no murmurs / rubs / gallops. No extremity edema Abdomen: mild diffuse tenderness, no masses palpated. No hepatosplenomegaly. Bowel sounds positive.  Musculoskeletal: no clubbing / cyanosis. No joint deformity upper and lower extremities. Good ROM, no contractures.  Normal muscle tone.  Significant back pain around level of waist reproducible with any movement.  Due to application of TLSO brace unable to palpate over same area Skin: no rashes, lesions, ulcers. No induration Neurologic: CN 2-12 grossly intact. Sensation intact,  Strength 5/5 x all 4 extremities.  Ability to reposition self in bed limited by back pain   Assessment & Plan:  Principal Problem:   Vertebral compression fracture (HCC) Active Problems:   T12 compression fracture, initial encounter (HCC)  Acute intractable low back pain in setting of T12 compression fracture History of chronic low back pain and fibromyalgia - States current pain severe than her usual chronic pain - s/p mechanical fall, CT head, C-spine, right hip negative for acute changes - CT chest with age-indeterminate T12 vertebral compression fracture - By neurosurgery, appreciate recs. not a candidate for kyphoplasty.  Recommend TLSO brace - Seen by palliative for pain management, appreciate recs.  Started MS Contin , on oxycodone  as needed, IV morphine  only for breakthrough pain, Tylenol  TID - Calcitonin, vitamin D  low normal, start daily supplements - Follow-up with neurosurgery outpatient - PT/OT rec SNF  Acute hypoxia - resolved Evolving left upper lobe pneumonia Possible chronic bronchitis/known asthma - Suspect multifactorial due to poor respiratory effort from pain, on opioids, possible pneumonia - No leukocytosis, afebrile, procalcitonin negative, RVP negative. Denies symptoms of UTI - BNP not elevated, echo shows EF 60 to 65%, grade 2 DD - CT with trace LUL consolidation, may represent developing infection/inflammation - Complete Ceftriaxone , Azithromycin   Possible COPD - Chronic  smoker, quit 3 months ago - Will need PFTs after discharge, pulmonary referral - Continue Breo Ellipta  and add DuoNebs every 6 hours as needed  Chronic Dizziness - chronic dizziness for months now, suspect due to  polypharmacy - MS Contin  has been discontinued - Decrease dose of Lyrica , change Hydroxyzine  to prn, hold Flexeril .  Continue oxycodone , IV morphine  as needed  Staph epidermidis bacteremia - contaminant -BCx 09/05 (1/2) with Staph epidermidis, likely contaminant -Repeat blood culture NGTD   Thrombocytopenia - Has been having intermittent thrombocytopenia for several years - Monitor platelets   Hypertension - Continue Toprol -XL   Dyslipidemia - Continue Crestor  and omega-3 fatty acid   Hypothyroidism - Continue Synthroid   Abdominal pain Constipation - Imaging negative for bowel obstruction - Encourage to take Movantik , MiraLAX , Senna-colace  DVT prophylaxis: Lovenox  SQ   Code Status: Full Code Disposition: SNF  Consultants:  Neurosurgery Palliative care  Procedures:  None  Antimicrobials:  Anti-infectives (From admission, onward)    Start     Dose/Rate Route Frequency Ordered Stop   12/21/23 0000  azithromycin  (ZITHROMAX ) 500 mg in sodium chloride  0.9 % 250 mL IVPB        500 mg 250 mL/hr over 60 Minutes Intravenous Every 24 hours 12/20/23 1547 12/21/23 0121   12/21/23 0000  cefTRIAXone  (ROCEPHIN ) 2 g in sodium chloride  0.9 % 100 mL IVPB        2 g 200 mL/hr over 30 Minutes Intravenous Every 24 hours 12/20/23 1547 12/23/23 2259   12/19/23 1800  acyclovir  (ZOVIRAX ) 200 MG capsule 400 mg        400 mg Oral Daily 12/19/23 1526     12/19/23 0000  cefTRIAXone  (ROCEPHIN ) 1 g in sodium chloride  0.9 % 100 mL IVPB  Status:  Discontinued        1 g 200 mL/hr over 30 Minutes Intravenous Every 24 hours 12/18/23 0943 12/18/23 1613   12/19/23 0000  azithromycin  (ZITHROMAX ) 500 mg in sodium chloride  0.9 % 250 mL IVPB  Status:  Discontinued        500 mg 250 mL/hr over 60 Minutes Intravenous Every 24 hours 12/18/23 0943 12/20/23 1547   12/19/23 0000  cefTRIAXone  (ROCEPHIN ) 2 g in sodium chloride  0.9 % 100 mL IVPB  Status:  Discontinued        2 g 200 mL/hr over 30 Minutes  Intravenous Every 24 hours 12/18/23 1613 12/20/23 1547   12/18/23 0145  cefTRIAXone  (ROCEPHIN ) 2 g in sodium chloride  0.9 % 100 mL IVPB        2 g 200 mL/hr over 30 Minutes Intravenous  Once 12/18/23 0139 12/18/23 0251   12/18/23 0145  azithromycin  (ZITHROMAX ) 500 mg in sodium chloride  0.9 % 250 mL IVPB        500 mg 250 mL/hr over 60 Minutes Intravenous  Once 12/18/23 0139 12/18/23 0359       Data Reviewed: I have personally reviewed following labs and imaging studies CBC: Recent Labs  Lab 12/18/23 0037 12/19/23 0742 12/21/23 0442  WBC 10.2 7.1 8.0  NEUTROABS 6.9  --   --   HGB 15.5* 13.8 13.1  HCT 48.0* 42.4 40.7  MCV 94.9 94.2 95.1  PLT 121* 111* 109*   Basic Metabolic Panel: Recent Labs  Lab 12/18/23 0037 12/19/23 0742 12/21/23 0442  NA 141 139 141  K 4.2 3.9 4.0  CL 101 105 108  CO2 26 29 26   GLUCOSE 123* 130* 86  BUN 7* 10 9  CREATININE 0.72 0.62  0.53  CALCIUM  9.0 8.4* 8.3*   GFR: Estimated Creatinine Clearance: 82 mL/min (by C-G formula based on SCr of 0.53 mg/dL). Liver Function Tests: Recent Labs  Lab 12/18/23 0037 12/19/23 0742  AST 34 21  ALT 27 19  ALKPHOS 76 57  BILITOT 1.1 0.8  PROT 6.9 5.9*  ALBUMIN 3.7 3.1*   CBG: Recent Labs  Lab 12/19/23 0800  GLUCAP 119*    Recent Results (from the past 240 hours)  Resp panel by RT-PCR (RSV, Flu A&B, Covid) Anterior Nasal Swab     Status: None   Collection Time: 12/18/23 12:37 AM   Specimen: Anterior Nasal Swab  Result Value Ref Range Status   SARS Coronavirus 2 by RT PCR NEGATIVE NEGATIVE Final    Comment: (NOTE) SARS-CoV-2 target nucleic acids are NOT DETECTED.  The SARS-CoV-2 RNA is generally detectable in upper respiratory specimens during the acute phase of infection. The lowest concentration of SARS-CoV-2 viral copies this assay can detect is 138 copies/mL. A negative result does not preclude SARS-Cov-2 infection and should not be used as the sole basis for treatment or other patient  management decisions. A negative result may occur with  improper specimen collection/handling, submission of specimen other than nasopharyngeal swab, presence of viral mutation(s) within the areas targeted by this assay, and inadequate number of viral copies(<138 copies/mL). A negative result must be combined with clinical observations, patient history, and epidemiological information. The expected result is Negative.  Fact Sheet for Patients:  BloggerCourse.com  Fact Sheet for Healthcare Providers:  SeriousBroker.it  This test is no t yet approved or cleared by the [place]  FDA and  has been authorized for detection and/or diagnosis of SARS-CoV-2 by FDA under an Emergency Use Authorization (EUA). This EUA will remain  in effect (meaning this test can be used) for the duration of the COVID-19 declaration under Section 564(b)(1) of the Act, 21 U.S.C.section 360bbb-3(b)(1), unless the authorization is terminated  or revoked sooner.       Influenza A by PCR NEGATIVE NEGATIVE Final   Influenza B by PCR NEGATIVE NEGATIVE Final    Comment: (NOTE) The Xpert Xpress SARS-CoV-2/FLU/RSV plus assay is intended as an aid in the diagnosis of influenza from Nasopharyngeal swab specimens and should not be used as a sole basis for treatment. Nasal washings and aspirates are unacceptable for Xpert Xpress SARS-CoV-2/FLU/RSV testing.  Fact Sheet for Patients: BloggerCourse.com  Fact Sheet for Healthcare Providers: SeriousBroker.it  This test is not yet approved or cleared by the [place]  FDA and has been authorized for detection and/or diagnosis of SARS-CoV-2 by FDA under an Emergency Use Authorization (EUA). This EUA will remain in effect (meaning this test can be used) for the duration of the COVID-19 declaration under Section 564(b)(1) of the Act, 21 U.S.C. section 360bbb-3(b)(1),  unless the authorization is terminated or revoked.     Resp Syncytial Virus by PCR NEGATIVE NEGATIVE Final    Comment: (NOTE) Fact Sheet for Patients: BloggerCourse.com  Fact Sheet for Healthcare Providers: SeriousBroker.it  This test is not yet approved or cleared by the [place]  FDA and has been authorized for detection and/or diagnosis of SARS-CoV-2 by FDA under an Emergency Use Authorization (EUA). This EUA will remain in effect (meaning this test can be used) for the duration of the COVID-19 declaration under Section 564(b)(1) of the Act, 21 U.S.C. section 360bbb-3(b)(1), unless the authorization is terminated or revoked.  Performed at [facility], [address] [place], [place] [address]  Blood culture (routine x 2)     Status: Abnormal   Collection Time: 12/18/23 12:38 AM   Specimen: BLOOD  Result Value Ref Range Status   Specimen Description   Final    BLOOD BLOOD RIGHT ARM Performed at [facility], [address], [place], [place] [address]    Special Requests   Final    BOTTLES DRAWN AEROBIC AND ANAEROBIC Blood Culture adequate volume Performed at [facility], [address], [place], [place] [address]    Culture  Setup Time   Final    GRAM POSITIVE COCCI AEROBIC BOTTLE ONLY CRITICAL RESULT CALLED TO, READ BACK BY AND VERIFIED WITH: NATHAN BELUE @ 12/18/2023 2317 AB    Culture (A)  Final    STAPHYLOCOCCUS EPIDERMIDIS THE SIGNIFICANCE OF ISOLATING THIS ORGANISM FROM A SINGLE SET OF BLOOD CULTURES WHEN MULTIPLE SETS ARE DRAWN IS UNCERTAIN. PLEASE NOTIFY THE MICROBIOLOGY DEPARTMENT WITHIN ONE WEEK IF SPECIATION AND SENSITIVITIES ARE REQUIRED. Performed at [facility], [address] [place], [place] [address]    Report Status 12/20/2023 FINAL  Final  Blood culture (routine x 2)     Status: None (Preliminary result)   Collection Time: 12/18/23 12:38 AM   Specimen:  BLOOD  Result Value Ref Range Status   Specimen Description BLOOD BLOOD LEFT ARM  Final   Special Requests   Final    BOTTLES DRAWN AEROBIC AND ANAEROBIC Blood Culture results may not be optimal due to an inadequate volume of blood received in culture bottles   Culture   Final    NO GROWTH 3 DAYS Performed at [facility], [address] [place], [place] [address]    Report Status PENDING  Incomplete  Blood Culture ID Panel (Reflexed)     Status: Abnormal   Collection Time: 12/18/23 12:38 AM  Result Value Ref Range Status   Enterococcus faecalis NOT DETECTED NOT DETECTED Final   Enterococcus Faecium NOT DETECTED NOT DETECTED Final   Listeria monocytogenes NOT DETECTED NOT DETECTED Final   Staphylococcus species DETECTED (A) NOT DETECTED Final    Comment: CRITICAL RESULT CALLED TO, READ BACK BY AND VERIFIED WITH: NATHAN BELUE @ 12/18/2023 2317 AB    Staphylococcus aureus (BCID) NOT DETECTED NOT DETECTED Final   Staphylococcus epidermidis DETECTED (A) NOT DETECTED Final    Comment: CRITICAL RESULT CALLED TO, READ BACK BY AND VERIFIED WITH: NATHAN BELUE @ 12/18/2023 2317 AB    Staphylococcus lugdunensis NOT DETECTED NOT DETECTED Final   Streptococcus species NOT DETECTED NOT DETECTED Final   Streptococcus agalactiae NOT DETECTED NOT DETECTED Final   Streptococcus pneumoniae NOT DETECTED NOT DETECTED Final   Streptococcus pyogenes NOT DETECTED NOT DETECTED Final   A.calcoaceticus-baumannii NOT DETECTED NOT DETECTED Final   Bacteroides fragilis NOT DETECTED NOT DETECTED Final   Enterobacterales NOT DETECTED NOT DETECTED Final   Enterobacter cloacae complex NOT DETECTED NOT DETECTED Final   Escherichia coli NOT DETECTED NOT DETECTED Final   Klebsiella aerogenes NOT DETECTED NOT DETECTED Final   Klebsiella oxytoca NOT DETECTED NOT DETECTED Final   Klebsiella pneumoniae NOT DETECTED NOT DETECTED Final   Proteus species NOT DETECTED NOT DETECTED Final   Salmonella species NOT  DETECTED NOT DETECTED Final   Serratia marcescens NOT DETECTED NOT DETECTED Final   Haemophilus influenzae NOT DETECTED NOT DETECTED Final   Neisseria meningitidis NOT DETECTED NOT DETECTED Final   Pseudomonas aeruginosa NOT DETECTED NOT DETECTED Final   Stenotrophomonas maltophilia NOT DETECTED NOT DETECTED Final  Candida albicans NOT DETECTED NOT DETECTED Final   Candida auris NOT DETECTED NOT DETECTED Final   Candida glabrata NOT DETECTED NOT DETECTED Final   Candida krusei NOT DETECTED NOT DETECTED Final   Candida parapsilosis NOT DETECTED NOT DETECTED Final   Candida tropicalis NOT DETECTED NOT DETECTED Final   Cryptococcus neoformans/gattii NOT DETECTED NOT DETECTED Final   Methicillin resistance mecA/C NOT DETECTED NOT DETECTED Final    Comment: Performed at [facility], [address] [place], [place] [address]  Respiratory (~20 pathogens) panel by PCR     Status: None   Collection Time: 12/19/23  9:37 AM   Specimen: Nasopharyngeal Swab; Respiratory  Result Value Ref Range Status   Adenovirus NOT DETECTED NOT DETECTED Final   Coronavirus 229E NOT DETECTED NOT DETECTED Final    Comment: (NOTE) The Coronavirus on the Respiratory Panel, DOES NOT test for the novel  Coronavirus (2019 nCoV)    Coronavirus HKU1 NOT DETECTED NOT DETECTED Final   Coronavirus NL63 NOT DETECTED NOT DETECTED Final   Coronavirus OC43 NOT DETECTED NOT DETECTED Final   Metapneumovirus NOT DETECTED NOT DETECTED Final   Rhinovirus / Enterovirus NOT DETECTED NOT DETECTED Final   Influenza A NOT DETECTED NOT DETECTED Final   Influenza B NOT DETECTED NOT DETECTED Final   Parainfluenza Virus 1 NOT DETECTED NOT DETECTED Final   Parainfluenza Virus 2 NOT DETECTED NOT DETECTED Final   Parainfluenza Virus 3 NOT DETECTED NOT DETECTED Final   Parainfluenza Virus 4 NOT DETECTED NOT DETECTED Final   Respiratory Syncytial Virus NOT DETECTED NOT DETECTED Final   Bordetella pertussis NOT DETECTED NOT  DETECTED Final   Bordetella Parapertussis NOT DETECTED NOT DETECTED Final   Chlamydophila pneumoniae NOT DETECTED NOT DETECTED Final   Mycoplasma pneumoniae NOT DETECTED NOT DETECTED Final    Comment: Performed at [facility], [address] [place], [place] [address]  Culture, blood (single) w Reflex to ID Panel     Status: None (Preliminary result)   Collection Time: 12/19/23  9:59 AM   Specimen: BLOOD  Result Value Ref Range Status   Specimen Description BLOOD BLOOD RIGHT HAND  Final   Special Requests   Final    BOTTLES DRAWN AEROBIC AND ANAEROBIC Blood Culture adequate volume   Culture   Final    NO GROWTH 2 DAYS Performed at [facility], [address] [place], [place] [address]    Report Status PENDING  Incomplete     Radiology Studies: DG Abd 1 View Result Date: 12/22/2023 CLINICAL DATA:  Abdominal pain, nausea EXAM: ABDOMEN - 1 VIEW COMPARISON:  None Available. FINDINGS: Mild gaseous distention of the stomach. Gas throughout nondistended large and small bowel. No organomegaly or free air. IMPRESSION: Mild gaseous distention of the stomach. Electronically Signed   By: Franky Crease M.D.   On: 12/22/2023 17:08     Scheduled Meds:  acetaminophen   975 mg Oral Q8H   acyclovir   400 mg Oral Daily   aspirin  EC  81 mg Oral Daily   calcitonin (salmon)  1 spray Alternating Nares Daily   cholecalciferol   1,000 Units Oral Daily   enoxaparin  (LOVENOX ) injection  45 mg Subcutaneous Q24H   fluticasone  furoate-vilanterol  1 puff Inhalation Daily   levothyroxine   88 mcg Oral Q0600   metoprolol  succinate  50 mg Oral QPM   morphine   15 mg Oral TID   naloxegol  oxalate  25 mg Oral Daily   omega-3 acid ethyl esters  2  capsule Oral BID   pantoprazole   40 mg Oral Daily   polyethylene glycol  17 g Oral BID   pregabalin   25 mg Oral BID   And   pregabalin   75 mg Oral QHS   rosuvastatin   40 mg Oral Daily   sodium chloride  flush  3 mL Intravenous Q12H   venlafaxine  XR  150  mg Oral QPM   ziprasidone   40 mg Oral QPM   Continuous Infusions:  cefTRIAXone  (ROCEPHIN )  IV 2 g (12/21/23 2331)     LOS: 4 days  MDM: Patient is high risk for one or more organ failure.  They necessitate ongoing hospitalization for continued IV therapies and subsequent lab monitoring. Total time spent interpreting labs and vitals, reviewing the medical record, coordinating care amongst consultants and care team members, directly assessing and discussing care with the patient and/or family: 35 min  Laree Lock, MD [facility]  To contact the attending physician between 7A-7P please use Epic Chat. To contact the covering physician during after hours 7P-7A, please review Amion.  12/22/2023, 6:41 PM   *This document has been created with the assistance of dictation software. Please excuse typographical errors. *

## 2023-12-22 NOTE — Progress Notes (Signed)
 Physical Therapy Treatment Patient Details Name: April Soto MRN: [id number] DOB: 1960/07/05 Today's Date: 12/22/2023   History of Present Illness 62yoF who comes to ED after slip in bathroom and fall with subsequent back pain. Workup revealing of age-indeterminate T12 40% vertebral body height loss consistent with acute compression fracture. Patient has been reporting significant pain. A TLSO brace was applied in the ED. PMH: asthma, tobacco abuse quit 3 months ago, chronic low back pain, major depressive disorder, GERD, dyslipidemia, hypertension, fibromyalgia and hypothyroidism.    PT Comments  Patient is agreeable to PT session. She continues to require assistance with mobility. She also requires assistance for donning the TLSO. Two bouts of short distance ambulation using rolling walker with activity tolerance limited by fatigue. She reports mild nausea that subsides with rest break. Recommend to continue PT to maximize independence. Rehabilitation < 3 hours/day recommended after this hospital stay.    If plan is discharge home, recommend the following: A little help with walking and/or transfers;A little help with bathing/dressing/bathroom;Assistance with cooking/housework;Direct supervision/assist for medications management;Direct supervision/assist for financial management;Assist for transportation;Help with stairs or ramp for entrance   Can travel by private vehicle     No  Equipment Recommendations   (defer to next level of care)    Recommendations for Other Services       Precautions / Restrictions Precautions Precautions: Fall;Back Precaution Booklet Issued: No Recall of Precautions/Restrictions: Intact Required Braces or Orthoses: Spinal Brace Spinal Brace: Thoracolumbosacral orthotic Restrictions Weight Bearing Restrictions Per Provider Order: No     Mobility  Bed Mobility Overal bed mobility: Needs Assistance Bed Mobility: Rolling, Sidelying to Sit Rolling: Mod  assist Sidelying to sit: Mod assist       General bed mobility comments: encouraged logroll technique    Transfers Overall transfer level: Needs assistance Equipment used: Rolling walker (2 wheels) Transfers: Sit to/from Stand Sit to Stand: Min assist, +2 physical assistance           General transfer comment: cues for safe hand placement. TLSO donned with maximal assistance prior to mobilizing    Ambulation/Gait Ambulation/Gait assistance: Contact guard assist Gait Distance (Feet):  (6ft, 67ft) Assistive device: Rolling walker (2 wheels) Gait Pattern/deviations: Step-through pattern, Decreased stride length Gait velocity: decreased     General Gait Details: ambulation distance is limited by fatigue and reported foot discomfort with increased walking. encouraged routine short distance ambulation with assistance for conditioning   Stairs             Wheelchair Mobility     Tilt Bed    Modified Rankin (Stroke Patients Only)       Balance Overall balance assessment: Needs assistance Sitting-balance support: No upper extremity supported, Feet supported Sitting balance-Leahy Scale: Fair     Standing balance support: Bilateral upper extremity supported Standing balance-Leahy Scale: Fair Standing balance comment: reliance on rolling walker for support                            Communication Communication Communication: No apparent difficulties  Cognition Arousal: Alert Behavior During Therapy: WFL for tasks assessed/performed   PT - Cognitive impairments: No apparent impairments                         Following commands: Intact      Cueing Cueing Techniques: Verbal cues  Exercises      General Comments  Pertinent Vitals/Pain Pain Assessment Pain Assessment: Faces Faces Pain Scale: Hurts little more Pain Location: abdomen Pain Descriptors / Indicators: Discomfort Pain Intervention(s): Limited activity within  patient's tolerance, Monitored during session, Premedicated before session, Repositioned    Home Living                          Prior Function            PT Goals (current goals can now be found in the care plan section) Acute Rehab PT Goals Patient Stated Goal: improved pain, reduce falling, return to baseline walking or better PT Goal Formulation: With patient Time For Goal Achievement: 01/01/24 Potential to Achieve Goals: Good Progress towards PT goals: Progressing toward goals    Frequency    Min 3X/week      PT Plan      Co-evaluation PT/OT/SLP Co-Evaluation/Treatment: Yes Reason for Co-Treatment: To address functional/ADL transfers (limited activity tolerance) PT goals addressed during session: Mobility/safety with mobility        AM-PAC PT 6 Clicks Mobility   Outcome Measure  Help needed turning from your back to your side while in a flat bed without using bedrails?: A Little Help needed moving from lying on your back to sitting on the side of a flat bed without using bedrails?: A Lot Help needed moving to and from a bed to a chair (including a wheelchair)?: A Lot Help needed standing up from a chair using your arms (e.g., wheelchair or bedside chair)?: A Lot Help needed to walk in hospital room?: A Little Help needed climbing 3-5 steps with a railing? : A Lot 6 Click Score: 14    End of Session Equipment Utilized During Treatment: Back brace Activity Tolerance: Patient tolerated treatment well Patient left: in chair;with call bell/phone within reach;with chair alarm set Nurse Communication: Mobility status PT Visit Diagnosis: Difficulty in walking, not elsewhere classified (R26.2);Other abnormalities of gait and mobility (R26.89);Repeated falls (R29.6);History of falling (Z91.81)     Time: 8581-8556 PT Time Calculation (min) (ACUTE ONLY): 25 min  Charges:    $Therapeutic Activity: 8-22 mins PT General Charges $$ ACUTE PT VISIT: 1  Visit                     Randine Essex, PT, MPT    Randine LULLA Essex 12/22/2023, 3:08 PM

## 2023-12-22 NOTE — Plan of Care (Signed)

## 2023-12-23 ENCOUNTER — Inpatient Hospital Stay

## 2023-12-23 DIAGNOSIS — S22030A Wedge compression fracture of third thoracic vertebra, initial encounter for closed fracture: Secondary | ICD-10-CM | POA: Diagnosis not present

## 2023-12-23 DIAGNOSIS — R609 Edema, unspecified: Secondary | ICD-10-CM | POA: Diagnosis not present

## 2023-12-23 DIAGNOSIS — S22080D Wedge compression fracture of T11-T12 vertebra, subsequent encounter for fracture with routine healing: Secondary | ICD-10-CM

## 2023-12-23 LAB — CULTURE, BLOOD (ROUTINE X 2): Culture: NO GROWTH

## 2023-12-23 NOTE — Hospital Course (Signed)
 33bn with h/o asthma, prior tobacco use d/o, chronic low back pain, depression, HLD, HTN, and hypothyroidism who presented on 9/4 with a fall secondary to polypharmacy.  She was found to have a T12 compression fracture and neurosurgery consulted and placed a TLSO brace.  She is medically stable for discharge to STR.

## 2023-12-23 NOTE — Plan of Care (Signed)

## 2023-12-23 NOTE — Progress Notes (Signed)
 Physical Therapy Treatment Patient Details Name: April Soto MRN: [id number] DOB: 1961/03/13 Today's Date: 12/23/2023   History of Present Illness 62yoF who comes to ED after slip in bathroom and fall with subsequent back pain. Workup revealing of age-indeterminate T12 40% vertebral body height loss consistent with acute compression fracture. Patient has been reporting significant pain. A TLSO brace was applied in the ED. PMH: asthma, tobacco abuse quit 3 months ago, chronic low back pain, major depressive disorder, GERD, dyslipidemia, hypertension, fibromyalgia and hypothyroidism.    PT Comments  Patient received in bed, she is agreeable to PT session. Patient requires min A for bed mobility. Cues for rolling and technique. She requires min A to don TLSO. Patient performed sit to stand from bed and elevated toilet with supervision only. She ambulated 25 feet with RW and supervision. No lob, she declines further ambulation due to reports of fatigue. She will continue to benefit from skilled PT to improve functional independence, activity tolerance and strength.      If plan is discharge home, recommend the following: A little help with walking and/or transfers;A little help with bathing/dressing/bathroom;Assistance with cooking/housework;Direct supervision/assist for medications management;Direct supervision/assist for financial management;Assist for transportation;Help with stairs or ramp for entrance   Can travel by private vehicle     No  Equipment Recommendations  None recommended by PT    Recommendations for Other Services       Precautions / Restrictions Precautions Precautions: Fall;Back Recall of Precautions/Restrictions: Intact Required Braces or Orthoses: Spinal Brace Spinal Brace: Thoracolumbosacral orthotic;Applied in sitting position Restrictions Weight Bearing Restrictions Per Provider Order: No     Mobility  Bed Mobility Overal bed mobility: Needs Assistance Bed  Mobility: Rolling, Sidelying to Sit Rolling: Min assist Sidelying to sit: Min assist       General bed mobility comments: encouraged logroll technique, requires cues and assist.    Transfers Overall transfer level: Needs assistance Equipment used: Rolling walker (2 wheels) Transfers: Sit to/from Stand Sit to Stand: Supervision           General transfer comment: patient able to stand from bed and raised toilet without physical assistance.    Ambulation/Gait Ambulation/Gait assistance: Contact guard assist, Supervision Gait Distance (Feet): 25 Feet Assistive device: Rolling walker (2 wheels) Gait Pattern/deviations: Step-through pattern, Decreased step length - right, Decreased step length - left, Decreased stride length Gait velocity: decreased     General Gait Details: patient ambulated to bathroom and back to recliner. Declined further ambulation due to feeling tired. Supervision for ambulation with RW.   Stairs             Wheelchair Mobility     Tilt Bed    Modified Rankin (Stroke Patients Only)       Balance Overall balance assessment: Modified Independent Sitting-balance support: Feet supported Sitting balance-Leahy Scale: Good     Standing balance support: Bilateral upper extremity supported, During functional activity, Reliant on assistive device for balance Standing balance-Leahy Scale: Good Standing balance comment: reliance on rolling walker for support                            Communication Communication Communication: No apparent difficulties  Cognition Arousal: Alert Behavior During Therapy: WFL for tasks assessed/performed   PT - Cognitive impairments: No apparent impairments                         Following commands:  Intact      Cueing Cueing Techniques: Verbal cues  Exercises      General Comments        Pertinent Vitals/Pain Pain Assessment Pain Assessment: Faces Faces Pain Scale: Hurts a  little bit Pain Location: back Pain Descriptors / Indicators: Discomfort, Sore Pain Intervention(s): Monitored during session    Home Living                          Prior Function            PT Goals (current goals can now be found in the care plan section) Acute Rehab PT Goals Patient Stated Goal: improved pain, reduce falling, return to baseline walking or better PT Goal Formulation: With patient Time For Goal Achievement: 01/01/24 Potential to Achieve Goals: Good Progress towards PT goals: Progressing toward goals    Frequency    Min 3X/week      PT Plan      Co-evaluation              AM-PAC PT 6 Clicks Mobility   Outcome Measure  Help needed turning from your back to your side while in a flat bed without using bedrails?: A Little Help needed moving from lying on your back to sitting on the side of a flat bed without using bedrails?: A Little Help needed moving to and from a bed to a chair (including a wheelchair)?: A Little Help needed standing up from a chair using your arms (e.g., wheelchair or bedside chair)?: A Little Help needed to walk in hospital room?: A Little Help needed climbing 3-5 steps with a railing? : A Lot 6 Click Score: 17    End of Session Equipment Utilized During Treatment: Back brace Activity Tolerance: Patient limited by fatigue Patient left: in chair;with call bell/phone within reach;with chair alarm set Nurse Communication: Mobility status PT Visit Diagnosis: Muscle weakness (generalized) (M62.81);Difficulty in walking, not elsewhere classified (R26.2);Repeated falls (R29.6)     Time: 8998-8987 PT Time Calculation (min) (ACUTE ONLY): 11 min  Charges:    $Gait Training: 8-22 mins PT General Charges $$ ACUTE PT VISIT: 1 Visit                     Kathrina Crosley, PT, GCS 12/23/23,10:52 AM

## 2023-12-23 NOTE — Progress Notes (Signed)
 Progress Note   Patient: April Soto FMW:[id number] DOB: October 29, 1960 DOA: 12/17/2023     5 DOS: the patient was seen and examined on 12/23/2023   Brief hospital course: 62yo with h/o asthma, prior tobacco use d/o, chronic low back pain, depression, HLD, HTN, and hypothyroidism who presented on 9/4 with a fall secondary to polypharmacy.  She was found to have a T12 compression fracture and neurosurgery consulted and placed a TLSO brace.  She is medically stable for discharge to STR.  Assessment and Plan:  Acute intractable low back pain in setting of T12 compression fracture with h/o chronic low back pain Presenting pain more severe than her usual chronic pain S/p mechanical fall - CT head, C-spine, right hip negative for acute changes CT chest with age-indeterminate T12 vertebral compression fracture Neurosurgery consulted, not a candidate for kyphoplasty.  Recommend TLSO brace Seen by palliative for pain management, appreciate recs.  Started MS Contin , on oxycodone  as needed, Tylenol  TID Start daily supplements of calcium , vitamin D  Follow-up with neurosurgery outpatient PT/OT rec STR, anticipate dc on 9/11   Acute hypoxia due to evolving left upper lobe pneumonia, resolved Possible chronic bronchitis/known asthma Suspect multifactorial hypoxia due to poor respiratory effort from pain, on opioids, possible pneumonia No leukocytosis, afebrile, procalcitonin negative, RVP negative. Denies symptoms of UTI BNP not elevated, echo shows EF 60 to 65%, grade 2 DD CT with trace LUL consolidation, may represent developing infection/inflammation Completed Ceftriaxone , Azithromycin    Possible COPD Chronic smoker, quit 3 months ago Will need PFTs after discharge, pulmonary referral Continue Breo Ellipta  and add DuoNebs every 6 hours as needed   Chronic Dizziness Chronic dizziness for months now, suspect due to polypharmacy MS Contin  has been discontinued Decrease dose of Lyrica , change  Hydroxyzine  to prn, hold Flexeril .  Continue oxycodone    Staph epidermidis bacteremia - contaminant BCx 09/05 (1/2) with Staph epidermidis, likely contaminant Repeat blood culture NTD   Thrombocytopenia Intermittent thrombocytopenia for several years Stable   Hypertension Continue Toprol -XL   Dyslipidemia Continue Crestor  and omega-3 fatty acid   Hypothyroidism Continue Synthroid    Abdominal pain/Constipation Imaging negative for bowel obstruction Encourage to take Movantik , MiraLAX , Senna-colace   Class 1 obesity Body mass index is 33.41 kg/m.SABRA  Weight loss should be encouraged Outpatient PCP/bariatric medicine f/u encouraged Significantly low or high BMI is associated with higher medical risk including morbidity and mortality        Consultants: Neurosurgery Palliative care PT OT TOC team   Procedures: None   Antibiotics: Azithromycin  x 4 doses Ceftriaxone  x 5 doses    30 Day Unplanned Readmission Risk Score    Flowsheet Row ED to Hosp-Admission (Current) from 12/17/2023 in [facility] 1C MEDICAL TELEMETRY  30 Day Unplanned Readmission Risk Score (%) 15.81 Filed at 12/23/2023 1600    This score is the patient's risk of an unplanned readmission within 30 days of being discharged (0 -100%). The score is based on dignosis, age, lab data, medications, orders, and past utilization.   Low:  0-14.9   Medium: 15-21.9   High: 22-29.9   Extreme: 30 and above           Subjective: Feeling better.  Concerned about her ability to participate in 1 hour a day of therapy and not convinced that she wants to go to rehab, but her son is certain and has convinced her.  They are concerned about chronic R ankle pain with deformity that they think might have contributed to her fall.  Objective: Vitals:   12/23/23 0436 12/23/23 1614  BP: (!) 91/58 120/67  Pulse: 74 (!) 107  Resp: 18 19  Temp: 97.8 F (36.6 C) 98.2 F (36.8 C)  SpO2: 92% 92%     Intake/Output Summary (Last 24 hours) at 12/23/2023 1711 Last data filed at 12/23/2023 0900 Gross per 24 hour  Intake 120 ml  Output --  Net 120 ml   Filed Weights   12/17/23 2036  Weight: 93.9 kg    Exam:  General:  Appears calm and comfortable and is in NAD Eyes:  normal lids, iris ENT:  grossly normal hearing, lips & tongue, mmm Cardiovascular:  RRR, no m/r/g. 1+ LE edema.  Respiratory:   CTA bilaterally with no wheezes/rales/rhonchi.  Normal respiratory effort. Abdomen:  soft, mild diffuse TTP, ND Skin:  no rash or induration seen on limited exam Musculoskeletal:  grossly normal tone BUE/BLE, R ankle lateral edema vs. deformity Psychiatric:  grossly normal mood and affect, speech fluent and appropriate, AOx3 Neurologic:  CN 2-12 grossly intact, moves all extremities in coordinated fashion   Data Reviewed: I have reviewed the patient's lab results since admission.  Pertinent labs for today include:   Stable BMP Normal CBC Blood culture with staph epidermidis     Family Communication: None present; her son was communicating via telephone throughout the encounter  Mobility: PT/OT Consulted and are recommending - Skilled Nursing-Short Term Rehab (<3 Hours/Day)12/23/2023 1049    Code Status: Full Code  Barriers to discharge:    Disposition: Status is: Inpatient Remains inpatient appropriate because: anticipate dc to STR 9/11, awaiting ankle xray report     Time spent: 50 minutes  Unresulted Labs (From admission, onward)    None        Author: Delon Herald, MD 12/23/2023 5:11 PM  For on call review [email or web address].

## 2023-12-24 DIAGNOSIS — I10 Essential (primary) hypertension: Secondary | ICD-10-CM

## 2023-12-24 DIAGNOSIS — J302 Other seasonal allergic rhinitis: Secondary | ICD-10-CM | POA: Diagnosis not present

## 2023-12-24 DIAGNOSIS — S22080D Wedge compression fracture of T11-T12 vertebra, subsequent encounter for fracture with routine healing: Secondary | ICD-10-CM | POA: Diagnosis not present

## 2023-12-24 DIAGNOSIS — K5900 Constipation, unspecified: Secondary | ICD-10-CM

## 2023-12-24 DIAGNOSIS — B957 Other staphylococcus as the cause of diseases classified elsewhere: Secondary | ICD-10-CM

## 2023-12-24 DIAGNOSIS — R5383 Other fatigue: Secondary | ICD-10-CM | POA: Diagnosis not present

## 2023-12-24 DIAGNOSIS — M818 Other osteoporosis without current pathological fracture: Secondary | ICD-10-CM | POA: Diagnosis not present

## 2023-12-24 DIAGNOSIS — Z7401 Bed confinement status: Secondary | ICD-10-CM | POA: Diagnosis not present

## 2023-12-24 DIAGNOSIS — S12290A Other displaced fracture of third cervical vertebra, initial encounter for closed fracture: Secondary | ICD-10-CM | POA: Diagnosis not present

## 2023-12-24 DIAGNOSIS — S22080S Wedge compression fracture of T11-T12 vertebra, sequela: Secondary | ICD-10-CM

## 2023-12-24 DIAGNOSIS — E785 Hyperlipidemia, unspecified: Secondary | ICD-10-CM

## 2023-12-24 DIAGNOSIS — F17210 Nicotine dependence, cigarettes, uncomplicated: Secondary | ICD-10-CM | POA: Diagnosis not present

## 2023-12-24 DIAGNOSIS — Z7189 Other specified counseling: Secondary | ICD-10-CM | POA: Diagnosis not present

## 2023-12-24 DIAGNOSIS — R52 Pain, unspecified: Secondary | ICD-10-CM | POA: Diagnosis not present

## 2023-12-24 DIAGNOSIS — Z23 Encounter for immunization: Secondary | ICD-10-CM | POA: Diagnosis present

## 2023-12-24 DIAGNOSIS — E66811 Obesity, class 1: Secondary | ICD-10-CM

## 2023-12-24 DIAGNOSIS — Z515 Encounter for palliative care: Secondary | ICD-10-CM | POA: Diagnosis not present

## 2023-12-24 DIAGNOSIS — G8929 Other chronic pain: Secondary | ICD-10-CM

## 2023-12-24 DIAGNOSIS — W010XXA Fall on same level from slipping, tripping and stumbling without subsequent striking against object, initial encounter: Secondary | ICD-10-CM | POA: Diagnosis not present

## 2023-12-24 DIAGNOSIS — M545 Low back pain, unspecified: Secondary | ICD-10-CM | POA: Diagnosis not present

## 2023-12-24 DIAGNOSIS — H814 Vertigo of central origin: Secondary | ICD-10-CM

## 2023-12-24 DIAGNOSIS — E039 Hypothyroidism, unspecified: Secondary | ICD-10-CM

## 2023-12-24 DIAGNOSIS — J301 Allergic rhinitis due to pollen: Secondary | ICD-10-CM | POA: Diagnosis not present

## 2023-12-24 DIAGNOSIS — D696 Thrombocytopenia, unspecified: Secondary | ICD-10-CM | POA: Diagnosis not present

## 2023-12-24 DIAGNOSIS — G928 Other toxic encephalopathy: Secondary | ICD-10-CM | POA: Diagnosis not present

## 2023-12-24 DIAGNOSIS — R2242 Localized swelling, mass and lump, left lower limb: Secondary | ICD-10-CM

## 2023-12-24 DIAGNOSIS — F17200 Nicotine dependence, unspecified, uncomplicated: Secondary | ICD-10-CM

## 2023-12-24 DIAGNOSIS — G9349 Other encephalopathy: Secondary | ICD-10-CM | POA: Diagnosis not present

## 2023-12-24 DIAGNOSIS — R062 Wheezing: Secondary | ICD-10-CM | POA: Diagnosis not present

## 2023-12-24 DIAGNOSIS — K649 Unspecified hemorrhoids: Secondary | ICD-10-CM | POA: Diagnosis not present

## 2023-12-24 DIAGNOSIS — Z743 Need for continuous supervision: Secondary | ICD-10-CM | POA: Diagnosis not present

## 2023-12-24 DIAGNOSIS — M549 Dorsalgia, unspecified: Secondary | ICD-10-CM | POA: Diagnosis not present

## 2023-12-24 DIAGNOSIS — J449 Chronic obstructive pulmonary disease, unspecified: Secondary | ICD-10-CM | POA: Diagnosis not present

## 2023-12-24 DIAGNOSIS — F39 Unspecified mood [affective] disorder: Secondary | ICD-10-CM

## 2023-12-24 DIAGNOSIS — K219 Gastro-esophageal reflux disease without esophagitis: Secondary | ICD-10-CM | POA: Diagnosis not present

## 2023-12-24 DIAGNOSIS — G893 Neoplasm related pain (acute) (chronic): Secondary | ICD-10-CM | POA: Diagnosis not present

## 2023-12-24 DIAGNOSIS — R0902 Hypoxemia: Secondary | ICD-10-CM | POA: Diagnosis not present

## 2023-12-24 DIAGNOSIS — Z6833 Body mass index (BMI) 33.0-33.9, adult: Secondary | ICD-10-CM

## 2023-12-24 LAB — CULTURE, BLOOD (SINGLE)
Culture: NO GROWTH
Special Requests: ADEQUATE

## 2023-12-24 MED ORDER — OXYCODONE HCL 10 MG PO TABS: 10.0000 mg | ORAL_TABLET | Freq: Four times a day (QID) | ORAL | 0 refills | Status: AC | PRN

## 2023-12-24 MED ORDER — CALCITONIN (SALMON) 200 UNIT/ACT NA SOLN: 1.0000 | Freq: Every day | NASAL | Status: AC

## 2023-12-24 MED ORDER — SIMETHICONE 80 MG PO CHEW: 80.0000 mg | CHEWABLE_TABLET | Freq: Four times a day (QID) | ORAL | Status: AC | PRN

## 2023-12-24 MED ORDER — SENNOSIDES-DOCUSATE SODIUM 8.6-50 MG PO TABS: 2.0000 | ORAL_TABLET | Freq: Two times a day (BID) | ORAL | Status: AC

## 2023-12-24 MED ORDER — MORPHINE SULFATE ER 15 MG PO TBCR: 15.0000 mg | EXTENDED_RELEASE_TABLET | Freq: Three times a day (TID) | ORAL | 0 refills | Status: AC

## 2023-12-24 MED ORDER — PREGABALIN 25 MG PO CAPS: ORAL_CAPSULE | ORAL | 0 refills | Status: AC

## 2023-12-24 MED ORDER — VITAMIN D3 25 MCG PO TABS: 1000.0000 [IU] | ORAL_TABLET | Freq: Every day | ORAL | Status: AC

## 2023-12-24 MED ORDER — POLYETHYLENE GLYCOL 3350 17 G PO PACK: 17.0000 g | PACK | Freq: Two times a day (BID) | ORAL | Status: AC

## 2023-12-24 NOTE — Progress Notes (Signed)
 Occupational Therapy Treatment Patient Details Name: April Soto MRN: [id number] DOB: 16-Jul-1960 Today's Date: 12/24/2023   History of present illness 62yoF who comes to ED after slip in bathroom and fall with subsequent back pain. Workup revealing of age-indeterminate T12 40% vertebral body height loss consistent with acute compression fracture. Patient has been reporting significant pain. A TLSO brace was applied in the ED. PMH: asthma, tobacco abuse quit 3 months ago, chronic low back pain, major depressive disorder, GERD, dyslipidemia, hypertension, fibromyalgia and hypothyroidism.   OT comments  Pt seen for OT tx. Pt sleeping but wakes easily to OT voice. Pt educated in additional cognitive behavioral pain coping strategies, falls prevention, and importance of activity pacing and work simplification to support overall pain mgt, safety, and indep with ADL and mobility. Pt verbalized understanding. RN notified of pt's pain.       If plan is discharge home, recommend the following:  A lot of help with walking and/or transfers;A lot of help with bathing/dressing/bathroom   Equipment Recommendations  BSC/3in1    Recommendations for Other Services      Precautions / Restrictions Precautions Precautions: Fall;Back Precaution Booklet Issued: No Recall of Precautions/Restrictions: Intact Required Braces or Orthoses: Spinal Brace Spinal Brace: Thoracolumbosacral orthotic;Applied in sitting position Restrictions Weight Bearing Restrictions Per Provider Order: No       Mobility Bed Mobility               General bed mobility comments: pt declined despite encouragement 2/2 pain    Transfers                         Balance                                           ADL either performed or assessed with clinical judgement   ADL                                              Extremity/Trunk Assessment               Vision       Perception     Praxis     Communication Communication Communication: No apparent difficulties   Cognition Arousal: Lethargic Behavior During Therapy: WFL for tasks assessed/performed Cognition: No apparent impairments                                        Cueing   Cueing Techniques: Verbal cues  Exercises Other Exercises Other Exercises: Pt edu in cognitive behavioral pain coping strategies, falls prevention, and importance of activity pacing and work simplification to support overall pain mgt, safety, and indep with ADL and mobility.    Shoulder Instructions       General Comments      Pertinent Vitals/ Pain       Pain Assessment Pain Assessment: 0-10 Pain Score: 5  Pain Location: back Pain Descriptors / Indicators: Discomfort, Sore Pain Intervention(s): Repositioned, Monitored during session, Patient requesting pain meds-RN notified  Home Living  Prior Functioning/Environment              Frequency  Min 2X/week        Progress Toward Goals  OT Goals(current goals can now be found in the care plan section)  Progress towards OT goals: Progressing toward goals  Acute Rehab OT Goals Patient Stated Goal: go to rehab OT Goal Formulation: With patient Time For Goal Achievement: 01/01/24 Potential to Achieve Goals: Good  Plan      Co-evaluation                 AM-PAC OT 6 Clicks Daily Activity     Outcome Measure   Help from another person eating meals?: None Help from another person taking care of personal grooming?: A Little Help from another person toileting, which includes using toliet, bedpan, or urinal?: A Lot Help from another person bathing (including washing, rinsing, drying)?: A Lot Help from another person to put on and taking off regular upper body clothing?: A Little Help from another person to put on and taking off regular lower body  clothing?: A Lot 6 Click Score: 16    End of Session    OT Visit Diagnosis: Other abnormalities of gait and mobility (R26.89);Muscle weakness (generalized) (M62.81)   Activity Tolerance Patient tolerated treatment well;Patient limited by pain   Patient Left in bed;with call bell/phone within reach;with bed alarm set   Nurse Communication Patient requests pain meds        Time: 1139-1150 OT Time Calculation (min): 11 min  Charges: OT General Charges $OT Visit: 1 Visit OT Treatments $Therapeutic Activity: 8-22 mins  Warren SAUNDERS., MPH, MS, OTR/L ascom [phone] 12/24/23, 12:58 PM

## 2023-12-24 NOTE — Plan of Care (Signed)

## 2023-12-24 NOTE — Discharge Summary (Signed)
 Physician Discharge Summary   Patient: April Soto MRN: [id number] DOB: 10-Mar-1961  Admit date:     12/17/2023  Discharge date: 12/24/23  Discharge Physician: Delon Herald   PCP: System, Provider Not In   Recommendations at discharge:   You are being discharged to [facility] for rehabilitation Follow up with [facility] primary care physician after discharge from rehab Referral made to orthopedics for your ankle Follow up with neurosurgery; they will contact you with an appointment Continue to wear TLSO brace when you are out of bed  Discharge Diagnoses: Active Problems:   Asthma, chronic   Essential hypertension   Chronic constipation   HLD (hyperlipidemia)   Class 1 obesity due to excess calories with body mass index (BMI) of 34.0 to 34.9 in adult   Chronic pain syndrome   Fibromyalgia   Acquired hypothyroidism   COPD (chronic obstructive pulmonary disease) (HCC)   Thrombocytopenia (HCC)   T12 compression fracture, initial encounter [facility])   Hospital Course: [phone] with h/o asthma, prior tobacco use d/o, chronic low back pain, depression, HLD, HTN, and hypothyroidism who presented on 9/4 with a fall secondary to polypharmacy.  She was found to have a T12 compression fracture and neurosurgery consulted and placed a TLSO brace.  She is medically stable for discharge to STR.  Assessment and Plan:  Acute intractable low back pain in setting of T12 compression fracture with h/o chronic low back pain Presenting pain more severe than her usual chronic pain S/p mechanical fall - CT head, C-spine, right hip negative for acute changes CT chest with age-indeterminate T12 vertebral compression fracture Neurosurgery consulted, not a candidate for kyphoplasty.  Recommend TLSO brace Seen by palliative for pain management, appreciate recs.  Started MS Contin , on oxycodone  as needed, Tylenol  TID Start daily supplements of calcium , vitamin D  and calcitonin NS Follow-up with  neurosurgery outpatient PT/OT rec STR, dc on 9/11   Acute hypoxia due to evolving left upper lobe pneumonia, resolved Possible chronic bronchitis/known asthma Suspect multifactorial hypoxia due to poor respiratory effort from pain, on opioids, possible pneumonia No leukocytosis, afebrile, procalcitonin negative, RVP negative. Denies symptoms of UTI BNP not elevated, echo shows EF 60 to 65%, grade 2 DD CT with trace LUL consolidation, may represent developing infection/inflammation Completed Ceftriaxone , Azithromycin   Ankle pain She has had several reconstructions and has chronic edema and pain Her son was concerned that this could have contributed to her fall Ankle xray negative other than soft tissue swelling Outpatient orthopedics f/u is recommended   Chronic pain I have reviewed this patient in the [place] Controlled Substances Reporting System.  She is receiving medications from only one provider and appears to be taking them as prescribed. She is at increased risk of opioid misuse, diversion, or overdose.  Continue MS Contin  and oxy IR for breakthrough  Possible COPD Chronic smoker, quit 3 months ago Will need PFTs after discharge, pulmonary referral Continue Breo Ellipta  and add DuoNebs every 6 hours as needed   Chronic Dizziness Chronic dizziness for months now, suspect due to polypharmacy MS Contin  has been discontinued Decrease dose of Lyrica , change Hydroxyzine  to prn, hold Flexeril .  Continue oxycodone    Staph epidermidis bacteremia - contaminant BCx 09/05 (1/2) with Staph epidermidis, likely contaminant Repeat blood culture NTD   Thrombocytopenia Intermittent thrombocytopenia for several years Stable OK to resume aspirin  81 mg   Hypertension Continue Toprol -XL   Dyslipidemia Continue Crestor  and omega-3 fatty acid   Hypothyroidism Continue Synthroid    Abdominal pain/Constipation Imaging  negative for bowel obstruction Encourage to take Movantik , MiraLAX ,  Senna-colace, simethicone , and PPI  Mood d/o  Continue hydroxyzine , venlafaxine , and Geodon   Tobacco use d/o Cessation counseling on an ongoing basis is needed Continue nicotine  patch   Class 1 obesity Body mass index is 33.41 kg/m.SABRA  Weight loss should be encouraged Outpatient PCP/bariatric medicine f/u encouraged Significantly low or high BMI is associated with higher medical risk including morbidity and mortality        Consultants: Neurosurgery Palliative care PT OT TOC team   Procedures: None   Antibiotics: Azithromycin  x 4 doses Ceftriaxone  x 5 doses    Pain control - [place]  Controlled Substance Reporting System database was reviewed. and patient was instructed, not to drive, operate heavy machinery, perform activities at heights, swimming or participation in water activities or provide baby-sitting services while on Pain, Sleep and Anxiety Medications; until their outpatient Physician has advised to do so again. Also recommended to not to take more than prescribed Pain, Sleep and Anxiety Medications.   Disposition: Skilled nursing facility Diet recommendation:  Regular diet DISCHARGE MEDICATION: Allergies as of 12/24/2023       Reactions   Levothyroxine  Dermatitis   depression        Medication List     STOP taking these medications    cyclobenzaprine  10 MG tablet Commonly known as: FLEXERIL    Olopatadine  HCl 0.2 % Soln   Xtampza  ER 9 MG C12a Generic drug: oxyCODONE  ER       TAKE these medications    acetaminophen  325 MG tablet Commonly known as: TYLENOL  Take 2 tablets (650 mg total) by mouth every 6 (six) hours as needed for mild pain (pain score 1-3), moderate pain (pain score 4-6), fever or headache (or Fever >/= 101).   acyclovir  400 MG tablet Commonly known as: ZOVIRAX  TAKE 1 TABLET BY MOUTH DAILY   albuterol  108 (90 Base) MCG/ACT inhaler Commonly known as: VENTOLIN  HFA Inhale 2 puffs into the lungs every 4 (four) hours  as needed for wheezing or shortness of breath.   aspirin  EC 81 MG tablet Take 1 tablet (81 mg total) by mouth daily. Swallow whole.   budesonide -formoterol  160-4.5 MCG/ACT inhaler Commonly known as: Symbicort  Inhale 2 puffs into the lungs daily.   calcitonin (salmon) 200 UNIT/ACT nasal spray Commonly known as: MIACALCIN /FORTICAL Place 1 spray into alternate nostrils daily. Start taking on: December 25, 2023   cetirizine  10 MG tablet Commonly known as: ZYRTEC  Take 1 tablet (10 mg total) by mouth daily.   diclofenac  75 MG EC tablet Commonly known as: VOLTAREN  Take 1 tablet (75 mg total) by mouth 2 (two) times daily as needed for moderate pain (pain score 4-6).   esomeprazole  40 MG capsule Commonly known as: NEXIUM  TAKE ONE CAPSULE (40 MG TOTAL) BY MOUTH DAILY.   fluticasone  50 MCG/ACT nasal spray Commonly known as: FLONASE  INSTILL ONE SPRAY INTO EACH NOSTRIL DAILY AS NEEDED FOR SEASONAL ALLERGY SYMPTOMS   hydrocortisone  2.5 % rectal cream Commonly known as: ANUSOL -HC Apply 1 application topically as needed for hemorrhoids.   hydrOXYzine  25 MG tablet Commonly known as: ATARAX  Take 25 mg by mouth 3 (three) times daily.   metoprolol  succinate 50 MG 24 hr tablet Commonly known as: TOPROL -XL TAKE 1 TABLET BY MOUTH EVERY NIGHT AT   BEDTIME   morphine  15 MG 12 hr tablet Commonly known as: MS CONTIN  Take 1 tablet (15 mg total) by mouth 3 (three) times daily for 5 days.   Movantik  25 MG  Tabs tablet Generic drug: naloxegol  oxalate Take 25 mg by mouth daily.   nicotine  21 mg/24hr patch Commonly known as: NICODERM CQ  - dosed in mg/24 hours Place 1 patch (21 mg total) onto the skin daily.   omega-3 acid ethyl esters 1 g capsule Commonly known as: LOVAZA  TAKE 2 CAPSULES BY MOUTH TWICE A DAY   ondansetron  4 MG disintegrating tablet Commonly known as: ZOFRAN -ODT Take 1 tablet (4 mg total) by mouth every 8 (eight) hours as needed for nausea or vomiting.   Oxycodone  HCl 10  MG Tabs Take 1 tablet (10 mg total) by mouth every 6 (six) hours as needed.   polyethylene glycol 17 g packet Commonly known as: MIRALAX  / GLYCOLAX  Take 17 g by mouth 2 (two) times daily.   pregabalin  25 MG capsule Commonly known as: LYRICA  Take 1 capsule (25 mg total) by mouth 2 (two) times daily at 10 am and 4 pm AND 3 capsules (75 mg total) at bedtime. What changed:  medication strength See the new instructions.   rosuvastatin  40 MG tablet Commonly known as: CRESTOR  TAKE 1 TABLET BY MOUTH ONCE A DAY   senna-docusate 8.6-50 MG tablet Commonly known as: Senokot-S Take 2 tablets by mouth 2 (two) times daily.   simethicone  80 MG chewable tablet Commonly known as: MYLICON Chew 1 tablet (80 mg total) by mouth every 6 (six) hours as needed for flatulence.   Synthroid  88 MCG tablet Generic drug: levothyroxine  TAKE 1 TABLET BY MOUTH ONCE A DAY BEFORE BREAKFAST   venlafaxine  XR 150 MG 24 hr capsule Commonly known as: EFFEXOR -XR Take 1 capsule (150 mg total) by mouth every evening. For mood control   vitamin D3 25 MCG tablet Commonly known as: CHOLECALCIFEROL  Take 1 tablet (1,000 Units total) by mouth daily. Start taking on: December 25, 2023   ziprasidone  40 MG capsule Commonly known as: GEODON  TAKE 1 CAPSULE BY MOUTH EVERY EVENING WITH FOOD FOR MOOD        Contact information for follow-up providers     [facility] Follow up.   Why: Patient to call and reschedule missed establishment of care appointment Contact information: [address] [place] [address] [phone]              Contact information for after-discharge care     Destination     [facility] [place], [place]. [place]   Service: Skilled Nursing Contact information: [address] [place]  [address] [phone]                    Discharge Exam:   Subjective: Feeling ok.  Agrees that STR is appropriate and is ready to go.   Objective: Vitals:    12/24/23 0415 12/24/23 0745  BP: 118/69 115/70  Pulse: 80 79  Resp: 16 15  Temp: 97.7 F (36.5 C) 97.9 F (36.6 C)  SpO2: 91% 94%   No intake or output data in the 24 hours ending 12/24/23 1001  Filed Weights   12/17/23 2036  Weight: 93.9 kg    Exam:  General:  Appears calm and comfortable and is in NAD Eyes:  normal lids, iris ENT:  grossly normal hearing, lips & tongue, mmm Cardiovascular:  RRR, no m/r/g. 1+ LE edema.  Respiratory:   CTA bilaterally with no wheezes/rales/rhonchi.  Normal respiratory effort. Abdomen:  soft, mild diffuse TTP, ND Skin:  no rash or induration seen on limited exam Musculoskeletal:  grossly normal tone BUE/BLE, R ankle lateral edema  vs. deformity Psychiatric:  grossly normal mood and affect, speech fluent and appropriate, AOx3 Neurologic:  CN 2-12 grossly intact, moves all extremities in coordinated fashion  Data Reviewed: I have reviewed the patient's lab results since admission.  Pertinent labs for today include:   None today    Condition at discharge: stable  The results of significant diagnostics from this hospitalization (including imaging, microbiology, ancillary and laboratory) are listed below for reference.   Imaging Studies: DG Ankle 2 Views Right Result Date: 12/23/2023 EXAM: 2 VIEW(S) XRAY OF THE RIGHT ANKLE 12/23/2023 11:59:16 AM CLINICAL HISTORY: Edema of extremity. Fall. Best images possible due to patient condition and swelling. COMPARISON: 03/13/2017 FINDINGS: BONES AND JOINTS: Orthopedic screws noted within the calcaneus and first metatarsal. No acute fracture. No focal osseous lesion. No joint dislocation. SOFT TISSUES: Soft tissue swelling at the ankle. IMPRESSION: 1. No acute osseous abnormality. 2. Soft tissue swelling at the ankle. Electronically signed by: Donnice Mania MD 12/23/2023 01:28 PM EDT RP Workstation: [id number]   DG Abd 1 View Result Date: 12/22/2023 CLINICAL DATA:  Abdominal pain, nausea EXAM: ABDOMEN - 1  VIEW COMPARISON:  None Available. FINDINGS: Mild gaseous distention of the stomach. Gas throughout nondistended large and small bowel. No organomegaly or free air. IMPRESSION: Mild gaseous distention of the stomach. Electronically Signed   By: Franky Crease M.D.   On: 12/22/2023 17:08   ECHOCARDIOGRAM COMPLETE Result Date: 12/18/2023    ECHOCARDIOGRAM REPORT   Patient Name:   April Soto Date of Exam: 12/18/2023 Medical Rec #:  [id number]       Height:       66.0 in Accession #:    [id number]      Weight:       207.0 lb Date of Birth:  09-Jan-1961       BSA:          2.029 m Patient Age:    62 years        BP:           116/90 mmHg Patient Gender: F               HR:           113 bpm. Exam Location:  [facility] Procedure: 2D Echo, Cardiac Doppler, Color Doppler and Intracardiac            Opacification Agent (Both Spectral and Color Flow Doppler were            utilized during procedure). Indications:     Dyspnea R06.00  History:         Patient has no prior history of Echocardiogram examinations.                  Signs/Symptoms:Dyspnea.  Sonographer:     Ashley McNeely-Sloane Referring Phys:  [id number] ISAIAH CROME ELLIS Diagnosing Phys: Redell Cave MD IMPRESSIONS  1. Left ventricular ejection fraction, by estimation, is 60 to 65%. The left ventricle has normal function. The left ventricle has no regional wall motion abnormalities. Left ventricular diastolic parameters are consistent with Grade II diastolic dysfunction (pseudonormalization).  2. Right ventricular systolic function is normal. The right ventricular size is normal.  3. The mitral valve is normal in structure. No evidence of mitral valve regurgitation.  4. The aortic valve was not well visualized. Aortic valve regurgitation is not visualized. FINDINGS  Left Ventricle: Left ventricular ejection fraction, by estimation, is 60 to 65%. The left ventricle has normal function. The left ventricle has  no regional wall motion abnormalities. Definity  contrast agent  was given IV to delineate the left ventricular  endocardial borders. The left ventricular internal cavity size was normal in size. There is no left ventricular hypertrophy. Left ventricular diastolic parameters are consistent with Grade II diastolic dysfunction (pseudonormalization). Right Ventricle: The right ventricular size is normal. No increase in right ventricular wall thickness. Right ventricular systolic function is normal. Left Atrium: Left atrial size was normal in size. Right Atrium: Right atrial size was normal in size. Pericardium: There is no evidence of pericardial effusion. Mitral Valve: The mitral valve is normal in structure. No evidence of mitral valve regurgitation. MV peak gradient, 5.9 mmHg. The mean mitral valve gradient is 4.0 mmHg. Tricuspid Valve: The tricuspid valve is grossly normal. Tricuspid valve regurgitation is not demonstrated. Aortic Valve: The aortic valve was not well visualized. Aortic valve regurgitation is not visualized. Aortic valve mean gradient measures 4.0 mmHg. Aortic valve peak gradient measures 7.1 mmHg. Pulmonic Valve: The pulmonic valve was not well visualized. Pulmonic valve regurgitation is not visualized. Aorta: The aortic root is normal in size and structure. IAS/Shunts: No atrial level shunt detected by color flow Doppler.  LEFT VENTRICLE PLAX 2D LVIDd:         4.40 cm     Diastology LVIDs:         2.70 cm     LV e' medial:    6.20 cm/s LV PW:         1.00 cm     LV E/e' medial:  16.5 LV IVS:        1.10 cm     LV e' lateral:   5.77 cm/s                            LV E/e' lateral: 17.7  LV Volumes (MOD) LV vol d, MOD A2C: 52.7 ml LV vol s, MOD A2C: 10.4 ml LV vol s, MOD A4C: 11.8 ml LV SV MOD A2C:     42.3 ml RIGHT VENTRICLE RV Basal diam:  3.10 cm RV Mid diam:    1.90 cm RV S prime:     12.30 cm/s TAPSE (M-mode): 1.6 cm LEFT ATRIUM           Index        RIGHT ATRIUM           Index LA diam:      3.40 cm 1.68 cm/m   RA Area:     11.40 cm LA Vol (A2C): 10.0 ml  4.93 ml/m   RA Volume:   25.10 ml  12.37 ml/m LA Vol (A4C): 27.6 ml 13.60 ml/m  AORTIC VALVE                   PULMONIC VALVE AV Vmax:           133.00 cm/s PV Vmax:        1.02 m/s AV Vmean:          86.600 cm/s PV Vmean:       71.200 cm/s AV VTI:            0.197 m     PV VTI:         0.146 m AV Peak Grad:      7.1 mmHg    PV Peak grad:   4.1 mmHg AV Mean Grad:      4.0 mmHg    PV Mean  grad:   2.5 mmHg LVOT Vmax:         129.00 cm/s RVOT Peak grad: 3 mmHg LVOT Vmean:        84.800 cm/s LVOT VTI:          0.193 m LVOT/AV VTI ratio: 0.98 MITRAL VALVE MV Area (PHT): 6.32 cm     SHUNTS MV Peak grad:  5.9 mmHg     Systemic VTI: 0.19 m MV Mean grad:  4.0 mmHg     Pulmonic VTI: 0.123 m MV Vmax:       1.21 m/s MV Vmean:      89.5 cm/s MV Decel Time: 120 msec MV E velocity: 102.00 cm/s Redell Cave MD Electronically signed by Redell Cave MD Signature Date/Time: 12/18/2023/3:52:39 PM    Final    CT Chest Wo Contrast Result Date: 12/17/2023 CLINICAL DATA:  Chest trauma, blunt.  Fall EXAM: CT CHEST WITHOUT CONTRAST TECHNIQUE: Multidetector CT imaging of the chest was performed following the standard protocol without IV contrast. RADIATION DOSE REDUCTION: This exam was performed according to the departmental dose-optimization program which includes automated exposure control, adjustment of the mA and/or kV according to patient size and/or use of iterative reconstruction technique. COMPARISON:  CT angio chest 03/14/2022 FINDINGS: Cardiovascular: The thoracic aorta is normal in caliber. The heart is normal in size. No significant pericardial effusion. Mild atherosclerotic plaque. Left anterior descending coronary calcification. Lungs/Pleura: Lower bilateral axis, lingular atelectasis. Diffuse bronchial wall thickening. Trace consolidation along the left upper lobe (4:64). No pulmonary nodule. No pulmonary mass. No pulmonary contusion or laceration. No pneumatocele formation. No pleural effusion. No pneumothorax.  No hemothorax. Mediastinum/Nodes: No pneumomediastinum. The central airways are patent. The esophagus is unremarkable. The thyroid  is unremarkable. Limited evaluation for hilar lymphadenopathy on this noncontrast study. No mediastinal or axillary lymphadenopathy. Musculoskeletal/Chest wall No chest wall mass. No acute rib or sternal fracture. Interval development of age-indeterminate T12 40% vertebral body height loss. Upper Abdomen: No acute abnormality. Status post cholecystectomy. Gastric surgical changes. Stable 1.5 cm right adrenal gland nodule with a density of -5 Hounsfield units- consistent with lipid-rich benign adenoma. No follow-up imaging is recommended. IMPRESSION: 1. No acute intrathoracic traumatic injury with limited evaluation on this noncontrast study. 2. Interval development of age-indeterminate T12 40% vertebral body height loss. Correlate with point tenderness to palpation to evaluate for an acute compression fracture component. 3. Trace left upper lobe consolidation that may represent developing infection/inflammation. Followup PA and lateral chest X-ray is recommended in 3-4 weeks following therapy to ensure resolution. 4. Aortic Atherosclerosis (ICD10-I70.0) including left anterior descending coronary calcification. Electronically Signed   By: Morgane  Naveau M.D.   On: 12/17/2023 23:36   CT HEAD WO CONTRAST ( ) Result Date: 12/17/2023 CLINICAL DATA:  Head trauma, moderate-severe; Neck trauma, dangerous injury mechanism (Age 19-64y) EXAM: CT HEAD WITHOUT CONTRAST CT CERVICAL SPINE WITHOUT CONTRAST TECHNIQUE: Multidetector CT imaging of the head and cervical spine was performed following the standard protocol without intravenous contrast. Multiplanar CT image reconstructions of the cervical spine were also generated. RADIATION DOSE REDUCTION: This exam was performed according to the departmental dose-optimization program which includes automated exposure control, adjustment of the mA  and/or kV according to patient size and/or use of iterative reconstruction technique. COMPARISON:  CT head 08/20/2023, CT C-spine 08/20/2023 FINDINGS: CT HEAD FINDINGS Brain: Patchy and confluent areas of decreased attenuation are noted throughout the deep and periventricular white matter of the cerebral hemispheres bilaterally, compatible with chronic microvascular ischemic disease. No evidence of  large-territorial acute infarction. No parenchymal hemorrhage. No mass lesion. No extra-axial collection. No mass effect or midline shift. No hydrocephalus. Basilar cisterns are patent. Vascular: No hyperdense vessel. Skull: No acute fracture or focal lesion. Sinuses/Orbits: Paranasal sinuses and mastoid air cells are clear. The orbits are unremarkable. Other: None. CT CERVICAL SPINE FINDINGS Alignment: Normal. Skull base and vertebrae: Multilevel moderate degenerative changes spine most prominent at the C4-C7 levels. No associated severe osseous neural foraminal or central canal stenosis. No acute fracture. No aggressive appearing focal osseous lesion or focal pathologic process. Soft tissues and spinal canal: No prevertebral fluid or swelling. No visible canal hematoma. Upper chest: Diffuse bronchial wall thickening. Please see separately dictated CT chest 12/17/2023. Other: Patient is edentulous. Atherosclerotic plaque of the aortic arch and its main branches. IMPRESSION: 1. No acute intracranial abnormality. 2. No acute displaced fracture or traumatic listhesis of the cervical spine. 3.  Aortic Atherosclerosis (ICD10-I70.0). Electronically Signed   By: Morgane  Naveau M.D.   On: 12/17/2023 23:28   CT Cervical Spine Wo Contrast Result Date: 12/17/2023 CLINICAL DATA:  Head trauma, moderate-severe; Neck trauma, dangerous injury mechanism (Age 64-64y) EXAM: CT HEAD WITHOUT CONTRAST CT CERVICAL SPINE WITHOUT CONTRAST TECHNIQUE: Multidetector CT imaging of the head and cervical spine was performed following the standard  protocol without intravenous contrast. Multiplanar CT image reconstructions of the cervical spine were also generated. RADIATION DOSE REDUCTION: This exam was performed according to the departmental dose-optimization program which includes automated exposure control, adjustment of the mA and/or kV according to patient size and/or use of iterative reconstruction technique. COMPARISON:  CT head 08/20/2023, CT C-spine 08/20/2023 FINDINGS: CT HEAD FINDINGS Brain: Patchy and confluent areas of decreased attenuation are noted throughout the deep and periventricular white matter of the cerebral hemispheres bilaterally, compatible with chronic microvascular ischemic disease. No evidence of large-territorial acute infarction. No parenchymal hemorrhage. No mass lesion. No extra-axial collection. No mass effect or midline shift. No hydrocephalus. Basilar cisterns are patent. Vascular: No hyperdense vessel. Skull: No acute fracture or focal lesion. Sinuses/Orbits: Paranasal sinuses and mastoid air cells are clear. The orbits are unremarkable. Other: None. CT CERVICAL SPINE FINDINGS Alignment: Normal. Skull base and vertebrae: Multilevel moderate degenerative changes spine most prominent at the C4-C7 levels. No associated severe osseous neural foraminal or central canal stenosis. No acute fracture. No aggressive appearing focal osseous lesion or focal pathologic process. Soft tissues and spinal canal: No prevertebral fluid or swelling. No visible canal hematoma. Upper chest: Diffuse bronchial wall thickening. Please see separately dictated CT chest 12/17/2023. Other: Patient is edentulous. Atherosclerotic plaque of the aortic arch and its main branches. IMPRESSION: 1. No acute intracranial abnormality. 2. No acute displaced fracture or traumatic listhesis of the cervical spine. 3.  Aortic Atherosclerosis (ICD10-I70.0). Electronically Signed   By: Morgane  Naveau M.D.   On: 12/17/2023 23:28   CT Hip Right Wo Contrast Result  Date: 12/17/2023 CLINICAL DATA:  Hip trauma, fracture suspected, xray done EXAM: CT OF THE RIGHT HIP WITHOUT CONTRAST TECHNIQUE: Multidetector CT imaging of the right hip was performed according to the standard protocol. Multiplanar CT image reconstructions were also generated. RADIATION DOSE REDUCTION: This exam was performed according to the departmental dose-optimization program which includes automated exposure control, adjustment of the mA and/or kV according to patient size and/or use of iterative reconstruction technique. COMPARISON:  X-ray right hip 12/17/2023 FINDINGS: Bones/Joint/Cartilage No evidence of fracture, dislocation, or joint effusion. Severe degenerative changes of the right hip with almost complete loss of joint space, subchondral  cystic changes and sclerosis. No aggressive appearing focal bone abnormality. Ligaments Suboptimally assessed by CT. Muscles and Tendons Grossly unremarkable. Soft tissues Unremarkable. Other: Colonic diverticulosis. IMPRESSION: 1. Severe degenerative changes of the right hip. 2.  Negative for acute traumatic injury. Electronically Signed   By: Morgane  Naveau M.D.   On: 12/17/2023 23:25   DG Ribs Unilateral W/Chest Left Result Date: 12/17/2023 CLINICAL DATA:  injury  fall EXAM: LEFT RIBS AND CHEST - 3+ VIEW COMPARISON:  None Available. FINDINGS: The heart and mediastinal contours are within normal limits. No focal consolidation. No pulmonary edema. No pleural effusion. No pneumothorax. No acute displaced fracture or other bone lesions are seen involving the left ribs. Bowel staples overlie the left upper quadrant. IMPRESSION: 1. No acute displaced left rib fracture. Please note, nondisplaced rib fractures may be occult on radiograph. 2. No acute cardiopulmonary abnormality. Electronically Signed   By: Morgane  Naveau M.D.   On: 12/17/2023 21:12   DG Hip Unilat W or Wo Pelvis 2-3 Views Right Result Date: 12/17/2023 CLINICAL DATA:  Fall, right hip pain EXAM: DG HIP  (WITH OR WITHOUT PELVIS) 2-3V RIGHT COMPARISON:  None Available. FINDINGS: Normal alignment. No acute fracture or dislocation. Severe right hip degenerative arthritis with near complete loss of the joint space and extensive osteophyte formation. Mild left hip degenerative arthritis. L4-5 fusion procedure with instrumentation noted. Soft tissues are unremarkable. IMPRESSION: 1. Severe right hip degenerative arthritis. No acute fracture or dislocation. Electronically Signed   By: Dorethia Molt M.D.   On: 12/17/2023 21:08    Microbiology: Results for orders placed or performed during the hospital encounter of 12/17/23  Resp panel by RT-PCR (RSV, Flu A&B, Covid) Anterior Nasal Swab     Status: None   Collection Time: 12/18/23 12:37 AM   Specimen: Anterior Nasal Swab  Result Value Ref Range Status   SARS Coronavirus 2 by RT PCR NEGATIVE NEGATIVE Final    Comment: (NOTE) SARS-CoV-2 target nucleic acids are NOT DETECTED.  The SARS-CoV-2 RNA is generally detectable in upper respiratory specimens during the acute phase of infection. The lowest concentration of SARS-CoV-2 viral copies this assay can detect is 138 copies/mL. A negative result does not preclude SARS-Cov-2 infection and should not be used as the sole basis for treatment or other patient management decisions. A negative result may occur with  improper specimen collection/handling, submission of specimen other than nasopharyngeal swab, presence of viral mutation(s) within the areas targeted by this assay, and inadequate number of viral copies(<138 copies/mL). A negative result must be combined with clinical observations, patient history, and epidemiological information. The expected result is Negative.  Fact Sheet for Patients:  BloggerCourse.com  Fact Sheet for Healthcare Providers:  SeriousBroker.it  This test is no t yet approved or cleared by the [place]  FDA and  has been  authorized for detection and/or diagnosis of SARS-CoV-2 by FDA under an Emergency Use Authorization (EUA). This EUA will remain  in effect (meaning this test can be used) for the duration of the COVID-19 declaration under Section 564(b)(1) of the Act, 21 U.S.C.section 360bbb-3(b)(1), unless the authorization is terminated  or revoked sooner.       Influenza A by PCR NEGATIVE NEGATIVE Final   Influenza B by PCR NEGATIVE NEGATIVE Final    Comment: (NOTE) The Xpert Xpress SARS-CoV-2/FLU/RSV plus assay is intended as an aid in the diagnosis of influenza from Nasopharyngeal swab specimens and should not be used as a sole basis for treatment. Nasal washings and aspirates  are unacceptable for Xpert Xpress SARS-CoV-2/FLU/RSV testing.  Fact Sheet for Patients: BloggerCourse.com  Fact Sheet for Healthcare Providers: SeriousBroker.it  This test is not yet approved or cleared by the [place]  FDA and has been authorized for detection and/or diagnosis of SARS-CoV-2 by FDA under an Emergency Use Authorization (EUA). This EUA will remain in effect (meaning this test can be used) for the duration of the COVID-19 declaration under Section 564(b)(1) of the Act, 21 U.S.C. section 360bbb-3(b)(1), unless the authorization is terminated or revoked.     Resp Syncytial Virus by PCR NEGATIVE NEGATIVE Final    Comment: (NOTE) Fact Sheet for Patients: BloggerCourse.com  Fact Sheet for Healthcare Providers: SeriousBroker.it  This test is not yet approved or cleared by the [place]  FDA and has been authorized for detection and/or diagnosis of SARS-CoV-2 by FDA under an Emergency Use Authorization (EUA). This EUA will remain in effect (meaning this test can be used) for the duration of the COVID-19 declaration under Section 564(b)(1) of the Act, 21 U.S.C. section 360bbb-3(b)(1), unless the  authorization is terminated or revoked.  Performed at [facility], [address] [place], [place] [address]   Blood culture (routine x 2)     Status: Abnormal   Collection Time: 12/18/23 12:38 AM   Specimen: BLOOD  Result Value Ref Range Status   Specimen Description   Final    BLOOD BLOOD RIGHT ARM Performed at [facility], [address], [place], [place] [address]    Special Requests   Final    BOTTLES DRAWN AEROBIC AND ANAEROBIC Blood Culture adequate volume Performed at [facility], [address], [place], [place] [address]    Culture  Setup Time   Final    GRAM POSITIVE COCCI AEROBIC BOTTLE ONLY CRITICAL RESULT CALLED TO, READ BACK BY AND VERIFIED WITH: NATHAN BELUE @ 12/18/2023 2317 AB    Culture (A)  Final    STAPHYLOCOCCUS EPIDERMIDIS THE SIGNIFICANCE OF ISOLATING THIS ORGANISM FROM A SINGLE SET OF BLOOD CULTURES WHEN MULTIPLE SETS ARE DRAWN IS UNCERTAIN. PLEASE NOTIFY THE MICROBIOLOGY DEPARTMENT WITHIN ONE WEEK IF SPECIATION AND SENSITIVITIES ARE REQUIRED. Performed at [facility], [address], [place], [place] [address]    Report Status 12/20/2023 FINAL  Final  Blood culture (routine x 2)     Status: None   Collection Time: 12/18/23 12:38 AM   Specimen: BLOOD  Result Value Ref Range Status   Specimen Description BLOOD BLOOD LEFT ARM  Final   Special Requests   Final    BOTTLES DRAWN AEROBIC AND ANAEROBIC Blood Culture results may not be optimal due to an inadequate volume of blood received in culture bottles   Culture   Final    NO GROWTH 5 DAYS Performed at [facility], [address] [place], [place] [address]    Report Status 12/23/2023 FINAL  Final  Blood Culture ID Panel (Reflexed)     Status: Abnormal   Collection Time: 12/18/23 12:38 AM  Result Value Ref Range Status   Enterococcus faecalis NOT DETECTED NOT DETECTED Final   Enterococcus Faecium NOT DETECTED NOT DETECTED Final   Listeria  monocytogenes NOT DETECTED NOT DETECTED Final   Staphylococcus species DETECTED (A) NOT DETECTED Final    Comment: CRITICAL RESULT CALLED TO, READ BACK BY AND VERIFIED WITH: NATHAN BELUE @ 12/18/2023 2317 AB    Staphylococcus aureus (BCID) NOT DETECTED NOT DETECTED Final   Staphylococcus epidermidis DETECTED (A) NOT DETECTED Final  Comment: CRITICAL RESULT CALLED TO, READ BACK BY AND VERIFIED WITH: NATHAN BELUE @ 12/18/2023 2317 AB    Staphylococcus lugdunensis NOT DETECTED NOT DETECTED Final   Streptococcus species NOT DETECTED NOT DETECTED Final   Streptococcus agalactiae NOT DETECTED NOT DETECTED Final   Streptococcus pneumoniae NOT DETECTED NOT DETECTED Final   Streptococcus pyogenes NOT DETECTED NOT DETECTED Final   A.calcoaceticus-baumannii NOT DETECTED NOT DETECTED Final   Bacteroides fragilis NOT DETECTED NOT DETECTED Final   Enterobacterales NOT DETECTED NOT DETECTED Final   Enterobacter cloacae complex NOT DETECTED NOT DETECTED Final   Escherichia coli NOT DETECTED NOT DETECTED Final   Klebsiella aerogenes NOT DETECTED NOT DETECTED Final   Klebsiella oxytoca NOT DETECTED NOT DETECTED Final   Klebsiella pneumoniae NOT DETECTED NOT DETECTED Final   Proteus species NOT DETECTED NOT DETECTED Final   Salmonella species NOT DETECTED NOT DETECTED Final   Serratia marcescens NOT DETECTED NOT DETECTED Final   Haemophilus influenzae NOT DETECTED NOT DETECTED Final   Neisseria meningitidis NOT DETECTED NOT DETECTED Final   Pseudomonas aeruginosa NOT DETECTED NOT DETECTED Final   Stenotrophomonas maltophilia NOT DETECTED NOT DETECTED Final   Candida albicans NOT DETECTED NOT DETECTED Final   Candida auris NOT DETECTED NOT DETECTED Final   Candida glabrata NOT DETECTED NOT DETECTED Final   Candida krusei NOT DETECTED NOT DETECTED Final   Candida parapsilosis NOT DETECTED NOT DETECTED Final   Candida tropicalis NOT DETECTED NOT DETECTED Final   Cryptococcus neoformans/gattii NOT  DETECTED NOT DETECTED Final   Methicillin resistance mecA/C NOT DETECTED NOT DETECTED Final    Comment: Performed at [facility], [address] [place], [place] [address]  Respiratory (~20 pathogens) panel by PCR     Status: None   Collection Time: 12/19/23  9:37 AM   Specimen: Nasopharyngeal Swab; Respiratory  Result Value Ref Range Status   Adenovirus NOT DETECTED NOT DETECTED Final   Coronavirus 229E NOT DETECTED NOT DETECTED Final    Comment: (NOTE) The Coronavirus on the Respiratory Panel, DOES NOT test for the novel  Coronavirus (2019 nCoV)    Coronavirus HKU1 NOT DETECTED NOT DETECTED Final   Coronavirus NL63 NOT DETECTED NOT DETECTED Final   Coronavirus OC43 NOT DETECTED NOT DETECTED Final   Metapneumovirus NOT DETECTED NOT DETECTED Final   Rhinovirus / Enterovirus NOT DETECTED NOT DETECTED Final   Influenza A NOT DETECTED NOT DETECTED Final   Influenza B NOT DETECTED NOT DETECTED Final   Parainfluenza Virus 1 NOT DETECTED NOT DETECTED Final   Parainfluenza Virus 2 NOT DETECTED NOT DETECTED Final   Parainfluenza Virus 3 NOT DETECTED NOT DETECTED Final   Parainfluenza Virus 4 NOT DETECTED NOT DETECTED Final   Respiratory Syncytial Virus NOT DETECTED NOT DETECTED Final   Bordetella pertussis NOT DETECTED NOT DETECTED Final   Bordetella Parapertussis NOT DETECTED NOT DETECTED Final   Chlamydophila pneumoniae NOT DETECTED NOT DETECTED Final   Mycoplasma pneumoniae NOT DETECTED NOT DETECTED Final    Comment: Performed at [facility], [address], [place], [place] [address]  Culture, blood (single) w Reflex to ID Panel     Status: None   Collection Time: 12/19/23  9:59 AM   Specimen: BLOOD  Result Value Ref Range Status   Specimen Description BLOOD BLOOD RIGHT HAND  Final   Special Requests   Final    BOTTLES DRAWN AEROBIC AND ANAEROBIC Blood Culture adequate volume   Culture   Final    NO GROWTH 5 DAYS  Performed at [facility], [address] [place], [place] [address]    Report Status 12/24/2023 FINAL  Final    Labs: CBC: Recent Labs  Lab 12/18/23 0037 12/19/23 0742 12/21/23 0442  WBC 10.2 7.1 8.0  NEUTROABS 6.9  --   --   HGB 15.5* 13.8 13.1  HCT 48.0* 42.4 40.7  MCV 94.9 94.2 95.1  PLT 121* 111* 109*   Basic Metabolic Panel: Recent Labs  Lab 12/18/23 0037 12/19/23 0742 12/21/23 0442  NA 141 139 141  K 4.2 3.9 4.0  CL 101 105 108  CO2 26 29 26   GLUCOSE 123* 130* 86  BUN 7* 10 9  CREATININE 0.72 0.62 0.53  CALCIUM  9.0 8.4* 8.3*   Liver Function Tests: Recent Labs  Lab 12/18/23 0037 12/19/23 0742  AST 34 21  ALT 27 19  ALKPHOS 76 57  BILITOT 1.1 0.8  PROT 6.9 5.9*  ALBUMIN 3.7 3.1*   CBG: Recent Labs  Lab 12/19/23 0800  GLUCAP 119*    Discharge time spent: greater than 30 minutes.  Signed: Delon Herald, MD [facility] 12/24/2023

## 2023-12-24 NOTE — Progress Notes (Signed)
 Report called to [facility] at [phone]. Report given to [facility]

## 2023-12-24 NOTE — Progress Notes (Signed)
 Palliative:  April Soto is sitting up in bed.  She appears chronically ill and obese.  She greets me, making and mostly keeping eye contact.  She is alert and oriented, able to make her needs known.  There is no family at bedside at this time.  We talked about her symptom management needs.  April Soto states that her pain management regimen is effective, she still has periods of discomfort, but overall it is manageable.  I encouraged her to remember that she must ask to receive these medications.  We also talked about her abdomen, her gassiness.  She states that she has taken Gas-X a few times, again I encouraged her to ask when she needs these medicines.  April Soto asks if it will be the same at rehab, that she must ask for medications.  I share that this is usually the case, and encouraged her to work closely with staff.  April Soto tells me that she is excited and ready for rehab.  Conference with attending, bedside nursing staff, transition of care team related to patient condition, needs, goals of care, disposition.  Plan: Continue to treat the treatable.  Short-term rehab at [facility] with ultimate goal of returning home.  50 minutes Lorenza Birkenhead, NP Palliative Medicine Team  Team Phone [phone]

## 2023-12-24 NOTE — TOC Transition Note (Signed)
 Transition of Care [facility]) - Discharge Note   Patient Details  Name: April Soto MRN: [id number] Date of Birth: 03-04-1961  Transition of Care [facility]) CM/SW Contact:  [place] L Letesha Klecker, LCSW Phone Number: 12/24/2023, 1:44 PM   Clinical Narrative:     Discharge summary in. Patient is ready to discharge to [facility]. Patient and son notified of discharge and transportation. [facility] was contacted.   No further TOC needs. TOC signing off.    Final next level of care: Skilled Nursing Facility Barriers to Discharge: No Barriers Identified   Patient Goals and CMS Choice     Choice offered to / list presented to : Patient [employer] ownership interest in [facility].provided to:: Patient    Discharge Placement              Patient chooses bed at: [facility] [place] Patient to be transferred to facility by: [facility] Name of family member notified: Sherwood Painter Patient and family notified of of transfer: 12/24/23  Discharge Plan and Services Additional resources added to the After Visit Summary for                                       Social Drivers of Health (SDOH) Interventions SDOH Screenings   Food Insecurity: Food Insecurity Present (12/18/2023)  Housing: High Risk (12/18/2023)  Transportation Needs: Unmet Transportation Needs (12/18/2023)  Utilities: Not At Risk (12/18/2023)  Alcohol Screen: Low Risk  (02/20/2023)  Depression (PHQ2-9): High Risk (02/20/2023)  Financial Resource Strain: High Risk (11/26/2023)  Physical Activity: Unknown (11/26/2023)  Social Connections: Unknown (11/26/2023)  Stress: Patient Declined (11/26/2023)  Tobacco Use: Medium Risk (12/21/2023)     Readmission Risk Interventions     No data to display

## 2023-12-25 DIAGNOSIS — J449 Chronic obstructive pulmonary disease, unspecified: Secondary | ICD-10-CM | POA: Diagnosis not present

## 2023-12-29 DIAGNOSIS — S22080D Wedge compression fracture of T11-T12 vertebra, subsequent encounter for fracture with routine healing: Secondary | ICD-10-CM | POA: Diagnosis not present

## 2023-12-29 DIAGNOSIS — J449 Chronic obstructive pulmonary disease, unspecified: Secondary | ICD-10-CM | POA: Diagnosis not present

## 2023-12-31 DIAGNOSIS — F17210 Nicotine dependence, cigarettes, uncomplicated: Secondary | ICD-10-CM | POA: Diagnosis not present

## 2023-12-31 DIAGNOSIS — J449 Chronic obstructive pulmonary disease, unspecified: Secondary | ICD-10-CM | POA: Diagnosis not present

## 2023-12-31 DIAGNOSIS — S22080D Wedge compression fracture of T11-T12 vertebra, subsequent encounter for fracture with routine healing: Secondary | ICD-10-CM | POA: Diagnosis not present

## 2023-12-31 DIAGNOSIS — I10 Essential (primary) hypertension: Secondary | ICD-10-CM | POA: Diagnosis not present

## 2024-01-05 DIAGNOSIS — S22080D Wedge compression fracture of T11-T12 vertebra, subsequent encounter for fracture with routine healing: Secondary | ICD-10-CM | POA: Diagnosis not present

## 2024-01-05 DIAGNOSIS — J449 Chronic obstructive pulmonary disease, unspecified: Secondary | ICD-10-CM | POA: Diagnosis not present

## 2024-01-07 ENCOUNTER — Other Ambulatory Visit: Payer: Self-pay

## 2024-01-07 DIAGNOSIS — T148XXA Other injury of unspecified body region, initial encounter: Secondary | ICD-10-CM

## 2024-01-07 DIAGNOSIS — I10 Essential (primary) hypertension: Secondary | ICD-10-CM | POA: Diagnosis not present

## 2024-01-07 DIAGNOSIS — S22080D Wedge compression fracture of T11-T12 vertebra, subsequent encounter for fracture with routine healing: Secondary | ICD-10-CM | POA: Diagnosis not present

## 2024-01-07 DIAGNOSIS — J449 Chronic obstructive pulmonary disease, unspecified: Secondary | ICD-10-CM | POA: Diagnosis not present

## 2024-01-09 DIAGNOSIS — M6259 Muscle wasting and atrophy, not elsewhere classified, multiple sites: Secondary | ICD-10-CM | POA: Diagnosis not present

## 2024-01-12 ENCOUNTER — Ambulatory Visit: Admitting: Physician Assistant

## 2024-01-21 ENCOUNTER — Encounter: Payer: Self-pay | Admitting: Physician Assistant

## 2024-01-27 ENCOUNTER — Telehealth: Payer: Self-pay

## 2024-01-27 NOTE — Telephone Encounter (Signed)
 Copied from CRM [id number]. Topic: Clinical - Medication Question >> Jan 27, 2024  3:06 PM Dedra B wrote: Reason for CRM: Pt wants to know if her metoprolol  succinate (TOPROL -XL) 50 MG 24 hr tablet can be refilled while she looks for another doctor. Pt would like a call letting her know whether or not this can be done.

## 2024-01-28 DIAGNOSIS — Z79891 Long term (current) use of opiate analgesic: Secondary | ICD-10-CM | POA: Diagnosis not present

## 2024-01-28 DIAGNOSIS — Z79899 Other long term (current) drug therapy: Secondary | ICD-10-CM | POA: Diagnosis not present

## 2024-01-29 ENCOUNTER — Other Ambulatory Visit: Payer: Self-pay | Admitting: Internal Medicine

## 2024-01-29 DIAGNOSIS — E785 Hyperlipidemia, unspecified: Secondary | ICD-10-CM

## 2024-02-08 DIAGNOSIS — M6259 Muscle wasting and atrophy, not elsewhere classified, multiple sites: Secondary | ICD-10-CM | POA: Diagnosis not present

## 2024-02-08 DIAGNOSIS — S22080D Wedge compression fracture of T11-T12 vertebra, subsequent encounter for fracture with routine healing: Secondary | ICD-10-CM | POA: Diagnosis not present

## 2024-02-11 ENCOUNTER — Other Ambulatory Visit (INDEPENDENT_AMBULATORY_CARE_PROVIDER_SITE_OTHER): Payer: Self-pay | Admitting: Vascular Surgery

## 2024-02-11 DIAGNOSIS — I7300 Raynaud's syndrome without gangrene: Secondary | ICD-10-CM

## 2024-02-16 ENCOUNTER — Encounter (INDEPENDENT_AMBULATORY_CARE_PROVIDER_SITE_OTHER): Payer: Self-pay | Admitting: Vascular Surgery

## 2024-02-16 ENCOUNTER — Other Ambulatory Visit (INDEPENDENT_AMBULATORY_CARE_PROVIDER_SITE_OTHER)

## 2024-05-18 ENCOUNTER — Other Ambulatory Visit: Payer: Self-pay | Admitting: Internal Medicine

## 2024-05-18 DIAGNOSIS — E7800 Pure hypercholesterolemia, unspecified: Secondary | ICD-10-CM
